# Patient Record
Sex: Female | Born: 1970 | Race: White | Hispanic: No | Marital: Married | Smoking: Never smoker
Health system: Southern US, Community
[De-identification: ages and names within clinical notes are randomized; demographics above are authoritative.]

## PROBLEM LIST (undated history)

## (undated) DIAGNOSIS — E039 Hypothyroidism, unspecified: Secondary | ICD-10-CM

## (undated) DIAGNOSIS — F259 Schizoaffective disorder, unspecified: Secondary | ICD-10-CM

## (undated) DIAGNOSIS — F419 Anxiety disorder, unspecified: Secondary | ICD-10-CM

## (undated) DIAGNOSIS — E079 Disorder of thyroid, unspecified: Secondary | ICD-10-CM

## (undated) DIAGNOSIS — R600 Localized edema: Secondary | ICD-10-CM

## (undated) DIAGNOSIS — Z9889 Other specified postprocedural states: Secondary | ICD-10-CM

## (undated) DIAGNOSIS — J45909 Unspecified asthma, uncomplicated: Secondary | ICD-10-CM

## (undated) DIAGNOSIS — F319 Bipolar disorder, unspecified: Secondary | ICD-10-CM

## (undated) DIAGNOSIS — M797 Fibromyalgia: Secondary | ICD-10-CM

## (undated) DIAGNOSIS — I10 Essential (primary) hypertension: Secondary | ICD-10-CM

## (undated) DIAGNOSIS — I5030 Unspecified diastolic (congestive) heart failure: Secondary | ICD-10-CM

## (undated) DIAGNOSIS — F250 Schizoaffective disorder, bipolar type: Secondary | ICD-10-CM

## (undated) DIAGNOSIS — R002 Palpitations: Secondary | ICD-10-CM

## (undated) DIAGNOSIS — E119 Type 2 diabetes mellitus without complications: Secondary | ICD-10-CM

## (undated) DIAGNOSIS — N83209 Unspecified ovarian cyst, unspecified side: Secondary | ICD-10-CM

## (undated) HISTORY — PX: ABDOMINAL HYSTERECTOMY: SHX81

## (undated) HISTORY — DX: Hypothyroidism, unspecified: E03.9

## (undated) HISTORY — DX: Morbid (severe) obesity due to excess calories: E66.01

## (undated) HISTORY — PX: ANKLE SURGERY: SHX546

## (undated) HISTORY — DX: Other specified postprocedural states: Z98.890

## (undated) HISTORY — DX: Unspecified diastolic (congestive) heart failure: I50.30

## (undated) HISTORY — DX: Palpitations: R00.2

---

## 2010-04-01 DIAGNOSIS — I82409 Acute embolism and thrombosis of unspecified deep veins of unspecified lower extremity: Secondary | ICD-10-CM | POA: Insufficient documentation

## 2012-02-20 DIAGNOSIS — F250 Schizoaffective disorder, bipolar type: Secondary | ICD-10-CM | POA: Insufficient documentation

## 2012-05-21 DIAGNOSIS — Z981 Arthrodesis status: Secondary | ICD-10-CM | POA: Insufficient documentation

## 2012-06-18 DIAGNOSIS — G894 Chronic pain syndrome: Secondary | ICD-10-CM | POA: Insufficient documentation

## 2012-10-16 DIAGNOSIS — F609 Personality disorder, unspecified: Secondary | ICD-10-CM | POA: Insufficient documentation

## 2013-04-11 DIAGNOSIS — K219 Gastro-esophageal reflux disease without esophagitis: Secondary | ICD-10-CM | POA: Insufficient documentation

## 2013-04-12 DIAGNOSIS — F1311 Sedative, hypnotic or anxiolytic abuse, in remission: Secondary | ICD-10-CM | POA: Insufficient documentation

## 2013-05-18 DIAGNOSIS — R600 Localized edema: Secondary | ICD-10-CM | POA: Insufficient documentation

## 2013-06-16 DIAGNOSIS — R519 Headache, unspecified: Secondary | ICD-10-CM | POA: Insufficient documentation

## 2013-06-16 DIAGNOSIS — R51 Headache: Secondary | ICD-10-CM

## 2014-05-20 ENCOUNTER — Ambulatory Visit
Admit: 2014-05-20 | Disposition: A | Discharge status: Home / Self Care | Payer: Self-pay | Attending: Family Medicine | Admitting: Family Medicine

## 2014-05-21 ENCOUNTER — Emergency Department
Admit: 2014-05-21 | Disposition: A | Discharge status: Home / Self Care | Payer: Self-pay | Admitting: Emergency Medicine

## 2014-05-21 ENCOUNTER — Ambulatory Visit
Admit: 2014-05-21 | Disposition: A | Discharge status: Home / Self Care | Payer: Self-pay | Attending: Family Medicine | Admitting: Family Medicine

## 2014-08-12 ENCOUNTER — Emergency Department
Admission: EM | Admit: 2014-08-12 | Discharge: 2014-08-12 | Disposition: A | Discharge status: Home / Self Care | Payer: Medicaid Other | Attending: Emergency Medicine | Admitting: Emergency Medicine

## 2014-08-12 ENCOUNTER — Encounter: Payer: Self-pay | Admitting: Emergency Medicine

## 2014-08-12 DIAGNOSIS — Z880 Allergy status to penicillin: Secondary | ICD-10-CM | POA: Diagnosis not present

## 2014-08-12 DIAGNOSIS — F419 Anxiety disorder, unspecified: Secondary | ICD-10-CM | POA: Diagnosis present

## 2014-08-12 DIAGNOSIS — Z765 Malingerer [conscious simulation]: Secondary | ICD-10-CM | POA: Diagnosis not present

## 2014-08-12 HISTORY — DX: Disorder of thyroid, unspecified: E07.9

## 2014-08-12 HISTORY — DX: Localized edema: R60.0

## 2014-08-12 NOTE — ED Notes (Signed)
Pt. Requested a call to a taxi cab service.  Told pt. I would call cab for her.

## 2014-08-12 NOTE — ED Notes (Signed)
Pt. Lied about her medications refills. Pt. States she has not filled xanax in months when pt. Confronted with facts pt. States she filled prescriptions just 10 days ago.  Unsure what medications pt. Takes on a daily basis.
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
## 2014-08-12 NOTE — ED Notes (Signed)
Pt. States feeling anxious all week worse today.  Pt. States she has not been taking medications regularly.

## 2014-08-12 NOTE — ED Provider Notes (Signed)
[facility] Emergency Department Provider Note  ____________________________________________  Time seen: Approximately 8:42 PM  I have reviewed the triage vital signs and the nursing notes.   HISTORY  Chief Complaint Anxiety     HPI Tonny Bollmanara Lise Furniss is a 44 y.o. female with a history of anxiety and unspecified psychiatric illness that required an act team in the past who presents with worsening anxiety for which she states has been months.  She states that due to multiple changes in location and providers she has not been able to fill her alprazolam for 8 months.  She states that she is been on edge more than usual and that she feels as if something bad is going to happen but without any specific concerns.  She specifically and repeatedly denies SI and HI.The onset is been gradual but it is now severe.  Also has significant bilateral pitting edema, but she states that this is chronic and "no big deal ".  She takes Lasix and does admit that she needs to follow up with her regular doctor regarding the edema.   Past Medical History  Diagnosis Date  . Edema leg for twenty years per patient    taking lasix for edema  . Thyroid disease     There are no active problems to display for this patient.   Past Surgical History  Procedure Laterality Date  . Ankle surgery      pt. states long time ago    Current Outpatient Rx  Name  Route  Sig  Dispense  Refill  . furosemide (LASIX) 20 MG tablet   Oral   Take 20 mg by mouth daily.           Allergies Penicillins  Family History  Problem Relation Age of Onset  . Cancer Mother     Social History History  Substance Use Topics  . Smoking status: Never Smoker   . Smokeless tobacco: Not on file  . Alcohol Use: No    Review of Systems Constitutional: No fever/chills Eyes: No visual changes. ENT: No sore throat. Cardiovascular: Denies chest pain. Respiratory: Denies shortness of  breath. Gastrointestinal: No abdominal pain.  No nausea, no vomiting.  No diarrhea.  No constipation. Genitourinary: Negative for dysuria. Musculoskeletal: Negative for back pain. Skin: Negative for rash. Neurological: Negative for headaches, focal weakness or numbness. Psych:  Worsening anxiety for "months".  Denies SI/HI.  10-point ROS otherwise negative.  ____________________________________________   PHYSICAL EXAM:  VITAL SIGNS: ED Triage Vitals  Enc Vitals Group     BP 08/12/14 1855 110/90 mmHg     Pulse Rate 08/12/14 1855 85     Resp 08/12/14 1855 20     Temp 08/12/14 1855 98.3 F (36.8 C)     Temp Source 08/12/14 1855 Oral     SpO2 08/12/14 1855 99 %     Weight --      Height --      Head Cir --      Peak Flow --      Pain Score --      Pain Loc --      Pain Edu? --      Excl. in GC? --     Constitutional: Alert and oriented. Well appearing and in no acute distress. Eyes: Conjunctivae are normal. PERRL. EOMI. Head: Atraumatic. Nose: No congestion/rhinnorhea. Mouth/Throat: Mucous membranes are moist.  Oropharynx non-erythematous. Neck: No stridor.   Cardiovascular: Normal rate, regular rhythm. Grossly normal heart sounds.  Good peripheral circulation. Respiratory: Normal respiratory effort.  No retractions. Lungs CTAB. Gastrointestinal: Soft and nontender. No distention. No abdominal bruits. No CVA tenderness. Musculoskeletal: Chronic bilateral lower extremity pitting edema without evidence of cellulitis.  No joint effusions. Neurologic:  Normal speech and language. No gross focal neurologic deficits are appreciated.  Skin:  Skin is warm, dry and intact. No rash noted. Psychiatric: Mood and affect are normal. Speech and behavior are normal.  Denies hallucinations.  Denies SI/HI.  ____________________________________________   LABS (all labs ordered are listed, but only abnormal results are displayed)  Labs Reviewed - No data to  display ____________________________________________  EKG  Not indicated ____________________________________________  RADIOLOGY  No results found.  ____________________________________________   PROCEDURES  Procedure(s) performed: None  Critical Care performed: No ____________________________________________   INITIAL IMPRESSION / ASSESSMENT AND PLAN / ED COURSE  Pertinent labs & imaging results that were available during my care of the patient were reviewed by me and considered in my medical decision making (see chart for details).  The patient states that she has anxiety that is been getting worse and that she has not taken her benzos for months.  However, when I checked the [place] controlled substance database, it is evident that she has been getting not only her regular prescribed monthly Xanax, but additional prescriptions as well.  I discussed this with her and she was speech was and admitted that may be she has gotten some as recently as 10 days ago.  I explained that we would not prescribe additional medication and that misrepresenting herself and her prescription status to the emergency department is not taken lightly.  I gave her outpatient follow-up information with [facility] in my usual and customary return precautions.  I left the patient's controlled substance database report on the chart to be scanned in.  ____________________________________________  FINAL CLINICAL IMPRESSION(S) / ED DIAGNOSES  Final diagnoses:  Anxiety  Drug-seeking behavior      NEW MEDICATIONS STARTED DURING THIS VISIT:  Discharge Medication List as of 08/12/2014  9:32 PM       Loleta Rose, MD 08/12/14 2329

## 2014-08-12 NOTE — Discharge Instructions (Signed)
As we discussed, you need to follow up with your regular doctor and therapist regarding her chronic anxiety.  We cannot prescribe additional medication over the medications you are already receiving.  Following up at [facility] if your regular therapist is not available.   Panic Attacks Panic attacks are sudden, short-livedsurges of severe anxiety, fear, or discomfort. They may occur for no reason when you are relaxed, when you are anxious, or when you are sleeping. Panic attacks may occur for a number of reasons:   Healthy people occasionally have panic attacks in extreme, life-threatening situations, such as war or natural disasters. Normal anxiety is a protective mechanism of the body that helps [place] react to danger (fight or flight response).  Panic attacks are often seen with anxiety disorders, such as panic disorder, social anxiety disorder, generalized anxiety disorder, and phobias. Anxiety disorders cause excessive or uncontrollable anxiety. They may interfere with your relationships or other life activities.  Panic attacks are sometimes seen with other mental illnesses, such as depression and posttraumatic stress disorder.  Certain medical conditions, prescription medicines, and drugs of abuse can cause panic attacks. SYMPTOMS  Panic attacks start suddenly, peak within 20 minutes, and are accompanied by four or more of the following symptoms:  Pounding heart or fast heart rate (palpitations).  Sweating.  Trembling or shaking.  Shortness of breath or feeling smothered.  Feeling choked.  Chest pain or discomfort.  Nausea or strange feeling in your stomach.  Dizziness, light-headedness, or feeling like you will faint.  Chills or hot flushes.  Numbness or tingling in your lips or hands and feet.  Feeling that things are not real or feeling that you are not yourself.  Fear of losing control or going crazy.  Fear of dying. Some of these symptoms can mimic serious medical  conditions. For example, you may think you are having a heart attack. Although panic attacks can be very scary, they are not life threatening. DIAGNOSIS  Panic attacks are diagnosed through an assessment by your health care provider. Your health care provider will ask questions about your symptoms, such as where and when they occurred. Your health care provider will also ask about your medical history and use of alcohol and drugs, including prescription medicines. Your health care provider may order blood tests or other studies to rule out a serious medical condition. Your health care provider may refer you to a mental health professional for further evaluation. TREATMENT   Most healthy people who have one or two panic attacks in an extreme, life-threatening situation will not require treatment.  The treatment for panic attacks associated with anxiety disorders or other mental illness typically involves counseling with a mental health professional, medicine, or a combination of both. Your health care provider will help determine what treatment is best for you.  Panic attacks due to physical illness usually go away with treatment of the illness. If prescription medicine is causing panic attacks, talk with your health care provider about stopping the medicine, decreasing the dose, or substituting another medicine.  Panic attacks due to alcohol or drug abuse go away with abstinence. Some adults need professional help in order to stop drinking or using drugs. HOME CARE INSTRUCTIONS   Take all medicines as directed by your health care provider.   Schedule and attend follow-up visits as directed by your health care provider. It is important to keep all your appointments. SEEK MEDICAL CARE IF:  You are not able to take your medicines as prescribed.  Your  symptoms do not improve or get worse. SEEK IMMEDIATE MEDICAL CARE IF:   You experience panic attack symptoms that are different than your usual  symptoms.  You have serious thoughts about hurting yourself or others.  You are taking medicine for panic attacks and have a serious side effect. MAKE SURE YOU:  Understand these instructions.  Will watch your condition.  Will get help right away if you are not doing well or get worse. Document Released: 01/13/2005 Document Revised: 01/18/2013 Document Reviewed: 08/27/2012 [facility] Patient Information 2015 [place]. This information is not intended to replace advice given to you by your health care provider. Make sure you discuss any questions you have with your health care provider.

## 2014-08-12 NOTE — ED Notes (Signed)
Pt has history of anxiety, hypothyroidism, and bilateral lower extremity edema.  Pt lives with husband and daughter.  Pt called EMS because she felt as if something "scared" her.  Pt is allergic to codeine and penicillins.  VS were stable PTA and EKG was normal sinus.  Pt states that she has been without her anti-anxiety medication for 8 months.  Pt c/o 6/10 pain in bilateral lower extremities.  Pt states that this pain is chronic and is "no big deal."

## 2015-10-09 DIAGNOSIS — E782 Mixed hyperlipidemia: Secondary | ICD-10-CM | POA: Insufficient documentation

## 2015-10-09 DIAGNOSIS — E876 Hypokalemia: Secondary | ICD-10-CM | POA: Insufficient documentation

## 2015-12-08 ENCOUNTER — Emergency Department: Payer: Medicaid Other

## 2015-12-08 ENCOUNTER — Emergency Department
Admission: EM | Admit: 2015-12-08 | Discharge: 2015-12-08 | Disposition: A | Discharge status: Home / Self Care | Payer: Medicaid Other | Attending: Emergency Medicine | Admitting: Emergency Medicine

## 2015-12-08 DIAGNOSIS — R110 Nausea: Secondary | ICD-10-CM | POA: Diagnosis not present

## 2015-12-08 DIAGNOSIS — R1031 Right lower quadrant pain: Secondary | ICD-10-CM | POA: Diagnosis not present

## 2015-12-08 DIAGNOSIS — Z79899 Other long term (current) drug therapy: Secondary | ICD-10-CM | POA: Insufficient documentation

## 2015-12-08 DIAGNOSIS — Z90710 Acquired absence of both cervix and uterus: Secondary | ICD-10-CM | POA: Insufficient documentation

## 2015-12-08 HISTORY — DX: Fibromyalgia: M79.7

## 2015-12-08 LAB — CBC WITH DIFFERENTIAL/PLATELET
Basophils Absolute: 0.1 10*3/uL (ref 0–0.1)
Basophils Relative: 1 %
Eosinophils Absolute: 0.6 10*3/uL (ref 0–0.7)
Eosinophils Relative: 6 %
HEMATOCRIT: 41.6 % (ref 35.0–47.0)
HEMOGLOBIN: 13.8 g/dL (ref 12.0–16.0)
LYMPHS PCT: 43 %
Lymphs Abs: 4.2 10*3/uL — ABNORMAL HIGH (ref 1.0–3.6)
MCH: 28.5 pg (ref 26.0–34.0)
MCHC: 33.2 g/dL (ref 32.0–36.0)
MCV: 86 fL (ref 80.0–100.0)
MONO ABS: 0.6 10*3/uL (ref 0.2–0.9)
MONOS PCT: 6 %
NEUTROS ABS: 4.4 10*3/uL (ref 1.4–6.5)
NEUTROS PCT: 44 %
Platelets: 336 10*3/uL (ref 150–440)
RBC: 4.84 MIL/uL (ref 3.80–5.20)
RDW: 15.8 % — ABNORMAL HIGH (ref 11.5–14.5)
WBC: 9.9 10*3/uL (ref 3.6–11.0)

## 2015-12-08 LAB — URINALYSIS COMPLETE WITH MICROSCOPIC (ARMC ONLY)
Bilirubin Urine: NEGATIVE
Glucose, UA: NEGATIVE mg/dL
Hgb urine dipstick: NEGATIVE
KETONES UR: NEGATIVE mg/dL
LEUKOCYTES UA: NEGATIVE
Nitrite: NEGATIVE
PROTEIN: NEGATIVE mg/dL
Specific Gravity, Urine: 1.018 (ref 1.005–1.030)
pH: 5 (ref 5.0–8.0)

## 2015-12-08 LAB — COMPREHENSIVE METABOLIC PANEL
ALT: 14 U/L (ref 14–54)
ANION GAP: 6 (ref 5–15)
AST: 25 U/L (ref 15–41)
Albumin: 4.4 g/dL (ref 3.5–5.0)
Alkaline Phosphatase: 62 U/L (ref 38–126)
BILIRUBIN TOTAL: 0.9 mg/dL (ref 0.3–1.2)
BUN: 7 mg/dL (ref 6–20)
CHLORIDE: 111 mmol/L (ref 101–111)
CO2: 24 mmol/L (ref 22–32)
Calcium: 9.5 mg/dL (ref 8.9–10.3)
Creatinine, Ser: 0.84 mg/dL (ref 0.44–1.00)
GFR calc Af Amer: >60 mL/min (ref >60)
GFR calc non Af Amer: >60 mL/min (ref >60)
GLUCOSE: 69 mg/dL (ref 65–99)
Potassium: 3.9 mmol/L (ref 3.5–5.1)
SODIUM: 141 mmol/L (ref 135–145)
TOTAL PROTEIN: 7.7 g/dL (ref 6.5–8.1)

## 2015-12-08 LAB — LIPASE, BLOOD: Lipase: 20 U/L (ref 11–51)

## 2015-12-08 MED ORDER — MORPHINE SULFATE (PF) 4 MG/ML IV SOLN
4.0000 mg | Freq: Once | INTRAVENOUS | Status: AC
  Administered 2015-12-08: 4 mg via INTRAVENOUS
  Filled 2015-12-08: qty 1

## 2015-12-08 MED ORDER — HYDROCODONE-ACETAMINOPHEN 5-325 MG PO TABS: 1.0000 | ORAL_TABLET | ORAL | 0 refills | Status: DC | PRN

## 2015-12-08 MED ORDER — IOPAMIDOL (ISOVUE-300) INJECTION 61%
30.0000 mL | Freq: Once | INTRAVENOUS | Status: AC | PRN
  Administered 2015-12-08: 30 mL via ORAL

## 2015-12-08 MED ORDER — KETOROLAC TROMETHAMINE 30 MG/ML IJ SOLN
15.0000 mg | Freq: Once | INTRAMUSCULAR | Status: AC
  Administered 2015-12-08: 15 mg via INTRAVENOUS
  Filled 2015-12-08: qty 1

## 2015-12-08 MED ORDER — IOPAMIDOL (ISOVUE-300) INJECTION 61%
100.0000 mL | Freq: Once | INTRAVENOUS | Status: AC | PRN
  Administered 2015-12-08: 100 mL via INTRAVENOUS

## 2015-12-08 NOTE — ED Notes (Signed)
MD Forbach at bedside. 

## 2015-12-08 NOTE — ED Notes (Signed)
Patient transported to Ultrasound 

## 2015-12-08 NOTE — ED Notes (Signed)
Pt took purse/wallet with her to CT

## 2015-12-08 NOTE — Discharge Instructions (Signed)
You have been seen in the Emergency Department (ED) for abdominal pain.  Your evaluation did not identify a clear cause of your symptoms but was generally reassuring.  Take Norco as prescribed for severe pain. Do not drink alcohol, drive or participate in any other potentially dangerous activities while taking this medication as it may make you sleepy. Do not take this medication with any other sedating medications, either prescription or over-the-counter. If you were prescribed Percocet or Vicodin, do not take these with acetaminophen (Tylenol) as it is already contained within these medications.   This medication is an opiate (or narcotic) pain medication and can be habit forming.  Use it as little as possible to achieve adequate pain control.  Do not use or use it with extreme caution if you have a history of opiate abuse or dependence.  If you are on a pain contract with your primary care doctor or a pain specialist, be sure to let them know you were prescribed this medication today from [facility].  This medication is intended for your use only - do not give any to anyone else and keep it in a secure place where nobody else, especially children, have access to it.  It will also cause or worsen constipation, so you may want to consider taking an over-the-counter stool softener while you are taking this medication.  Please follow up as instructed above regarding today?s emergent visit and the symptoms that are bothering you.  Return to the ED if your abdominal pain worsens or fails to improve, you develop bloody vomiting, bloody diarrhea, you are unable to tolerate fluids due to vomiting, fever greater than 101, or other symptoms that concern you.

## 2015-12-08 NOTE — ED Notes (Signed)
Error in charting - no trauma
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
## 2015-12-08 NOTE — ED Provider Notes (Signed)
[facility] Emergency Department Provider Note  ____________________________________________   None    (approximate)  I have reviewed the triage vital signs and the nursing notes.   HISTORY  Chief Complaint Abdominal Pain    HPI Rhonda Palmer is a 45 y.o. female with a history of right lower quadrant abdominal pain intermittently for about a year and a history reportedly of a large ovary on her right cyst status post hysterectomy who presents for evaluation of about a week of intermittent pain in the right lower quadrant.  She reports that it has gotten worse over the last 24 hours.  She has sharp and stabbing pain that happens intermittently and lasts minutes to hours at a time.  It is currently 8 out of 10 (severe).  It is accompanied with nausea but she denies vomiting.  She reports that nothing makes it better and movement makes it worse.  She denies vaginal bleeding, fever/chills, chest pain, shortness of breath.  She denies dysuria.  She reports it is much worse than prior pain.  She has been on tramadol for months prescribed by her primary care doctor and she has not wanted to go to a pain clinic as has been recommended in the past by her PCP (this information was obtained by me through the electronic medical records, not from the patient).   Past Medical History:  Diagnosis Date  . Edema leg for twenty years per patient   taking lasix for edema  . Fibromyalgia   . Thyroid disease     There are no active problems to display for this patient.   Past Surgical History:  Procedure Laterality Date  . ANKLE SURGERY     pt. states long time ago    Prior to Admission medications   Medication Sig Start Date End Date Taking? Authorizing Provider  ARIPiprazole (ABILIFY) 10 MG tablet Take 10 mg by mouth daily.   Yes Historical Provider, MD  haloperidol (HALDOL) 10 MG tablet Take 10 mg by mouth 4 (four) times daily.   Yes Historical Provider, MD    ibuprofen (ADVIL,MOTRIN) 600 MG tablet Take 600 mg by mouth every 6 (six) hours as needed.   Yes Historical Provider, MD  levothyroxine (SYNTHROID, LEVOTHROID) 100 MCG tablet Take 100 mcg by mouth daily before breakfast.   Yes Historical Provider, MD  pantoprazole (PROTONIX) 20 MG tablet Take 40 mg by mouth daily.   Yes Historical Provider, MD  topiramate (TOPAMAX) 100 MG tablet Take 100 mg by mouth 2 (two) times daily.   Yes Historical Provider, MD  traMADol (ULTRAM) 50 MG tablet Take by mouth every 6 (six) hours as needed.   Yes Historical Provider, MD  venlafaxine (EFFEXOR) 100 MG tablet Take 100 mg by mouth 2 (two) times daily.   Yes Historical Provider, MD  furosemide (LASIX) 20 MG tablet Take 20 mg by mouth daily.    Historical Provider, MD  HYDROcodone-acetaminophen (NORCO/VICODIN) 5-325 MG tablet Take 1-2 tablets by mouth every 4 (four) hours as needed for moderate pain. 12/08/15   Loleta Roseory Tyson Parkison, MD    Allergies Sulfa antibiotics; Codeine; and Penicillins  Family History  Problem Relation Age of Onset  . Heart failure Mother     Social History Social History  Substance Use Topics  . Smoking status: Never Smoker  . Smokeless tobacco: Never Used  . Alcohol use No    Review of Systems Constitutional: No fever/chills Eyes: No visual changes. ENT: No sore throat. Cardiovascular: Denies chest  pain. Respiratory: Denies shortness of breath. Gastrointestinal: +RLQ abd pain, nausea, no vomiting, recent diarrhea due to taking laxatives Genitourinary: Negative for dysuria. Musculoskeletal: Negative for back pain. Skin: Negative for rash. Neurological: Negative for headaches, focal weakness or numbness.  10-point ROS otherwise negative.  ____________________________________________   PHYSICAL EXAM:  VITAL SIGNS: ED Triage Vitals  Enc Vitals Group     BP 12/08/15 0023 124/84     Pulse Rate 12/08/15 0023 79     Resp 12/08/15 0023 14     Temp 12/08/15 0023 97.9 F (36.6  C)     Temp Source 12/08/15 0023 Oral     SpO2 12/08/15 0020 97 %     Weight 12/08/15 0029 180 lb (81.6 kg)     Height 12/08/15 0029 5' (1.524 m)     Head Circumference --      Peak Flow --      Pain Score 12/08/15 0029 8     Pain Loc --      Pain Edu? --      Excl. in GC? --     Constitutional: Alert and oriented. Well appearing and in no acute distress. Eyes: Conjunctivae are normal. PERRL. EOMI. Head: Atraumatic. Nose: No congestion/rhinnorhea. Mouth/Throat: Mucous membranes are moist.  Oropharynx non-erythematous. Neck: No stridor.  No meningeal signs.   Cardiovascular: Normal rate, regular rhythm. Good peripheral circulation. Grossly normal heart sounds. Respiratory: Normal respiratory effort.  No retractions. Lungs CTAB. Gastrointestinal: Soft and nontender. No distention.  Genitourinary: deferred Musculoskeletal: No lower extremity tenderness nor edema. No gross deformities of extremities. Neurologic:  Normal speech and language. No gross focal neurologic deficits are appreciated.  Skin:  Skin is warm, dry and intact. No rash noted. Psychiatric: Mood and affect are depressed but essentially normal. Speech and behavior are normal.  ____________________________________________   LABS (all labs ordered are listed, but only abnormal results are displayed)  Labs Reviewed  CBC WITH DIFFERENTIAL/PLATELET - Abnormal; Notable for the following:       Result Value   RDW 15.8 (*)    Lymphs Abs 4.2 (*)    All other components within normal limits  URINALYSIS COMPLETEWITH MICROSCOPIC ([facility] ONLY) - Abnormal; Notable for the following:    Color, Urine YELLOW (*)    APPearance HAZY (*)    Bacteria, UA MANY (*)    Squamous Epithelial / LPF 0-5 (*)    All other components within normal limits  URINE CULTURE  COMPREHENSIVE METABOLIC PANEL  LIPASE, BLOOD   ____________________________________________  EKG  ED ECG REPORT I, Kadee Philyaw, the attending physician, personally  viewed and interpreted this ECG.  Date: 12/08/2015 EKG Time: 00:36 Rate: 77 Rhythm: normal sinus rhythm QRS Axis: normal Intervals: normal ST/T Wave abnormalities: Non-specific ST segment / T-wave changes, but no evidence of acute ischemia. Conduction Disturbances: none Narrative Interpretation: unremarkable  ____________________________________________  RADIOLOGY   US Transvaginal Non-ob  Result Date: 12/08/2015 CLINICAL DATA:  Intermittent right lower quadrant pain since yesterday. Previous hysterectomy. EXAM: TRANSABDOMINAL AND TRANSVAGINAL ULTRASOUND OF PELVIS TECHNIQUE: Both transabdominal and transvaginal ultrasound examinations of the pelvis were performed. Transabdominal technique was performed for global imaging of the pelvis including uterus, ovaries, adnexal regions, and pelvic cul-de-sac. It was necessary to proceed with endovaginal exam following the transabdominal exam to visualize the ovaries. COMPARISON:  None FINDINGS: Uterus Uterus is surgically absent. Endometrium Surgically absent. Right ovary Not visualized. Left ovary Not visualized. Other findings Visualization of the pelvis is limited due to large amount of bowel  gas and stool in the pelvis. Ovaries are not visualized and therefore no ovarian Doppler examination is performed. No free fluid is seen in the pelvis. No large pelvic mass lesions are identified. IMPRESSION: Uterus is surgically absent. Right and left ovaries are not visualized due to overlying bowel. Electronically Signed   By: Burman NievesWilliam  Stevens M.D.   On: 12/08/2015 03:19   [place] Pelvis Complete  Result Date: 12/08/2015 CLINICAL DATA:  Intermittent right lower quadrant pain since yesterday. Previous hysterectomy. EXAM: TRANSABDOMINAL AND TRANSVAGINAL ULTRASOUND OF PELVIS TECHNIQUE: Both transabdominal and transvaginal ultrasound examinations of the pelvis were performed. Transabdominal technique was performed for global imaging of the pelvis including uterus,  ovaries, adnexal regions, and pelvic cul-de-sac. It was necessary to proceed with endovaginal exam following the transabdominal exam to visualize the ovaries. COMPARISON:  None FINDINGS: Uterus Uterus is surgically absent. Endometrium Surgically absent. Right ovary Not visualized. Left ovary Not visualized. Other findings Visualization of the pelvis is limited due to large amount of bowel gas and stool in the pelvis. Ovaries are not visualized and therefore no ovarian Doppler examination is performed. No free fluid is seen in the pelvis. No large pelvic mass lesions are identified. IMPRESSION: Uterus is surgically absent. Right and left ovaries are not visualized due to overlying bowel. Electronically Signed   By: Burman NievesWilliam  Stevens M.D.   On: 12/08/2015 03:19   Ct Abdomen Pelvis W Contrast  Result Date: 12/08/2015 CLINICAL DATA:  45 y/o F; right-sided abdominal pain with history of right-sided ovarian cysts. Concern for cyst rupture. EXAM: CT ABDOMEN AND PELVIS WITH CONTRAST TECHNIQUE: Multidetector CT imaging of the abdomen and pelvis was performed using the standard protocol following bolus administration of intravenous contrast. CONTRAST:  100mL ISOVUE-300 IOPAMIDOL (ISOVUE-300) INJECTION 61% COMPARISON:  None. FINDINGS: Lower chest: No acute abnormality. Hepatobiliary: No focal liver abnormality is seen. Status post cholecystectomy. No intrahepatic biliary ductal dilatation. Mild dilatation of the common bile duct is likely within normal limits given cholecystectomy. Pancreas: Unremarkable. No pancreatic ductal dilatation or surrounding inflammatory changes. Spleen: Normal in size without focal abnormality. Adrenals/Urinary Tract: Adrenal glands are unremarkable. Right greater than left extrarenal pelvis. Kidneys are normal, without renal calculi, focal lesion, or hydronephrosis. Bladder is unremarkable. Stomach/Bowel: Stomach is within normal limits. Appendix is borderline in size however there are no  secondary signs of appendicitis, this is likely a normal variant (series 7 image 46). No evidence of bowel wall thickening, distention, or inflammatory changes. Vascular/Lymphatic: No significant vascular findings are present. No enlarged abdominal or pelvic lymph nodes. Reproductive: Status post hysterectomy. No adnexal masses. No evidence for ruptured ovarian cyst. Other: Right flank subcutaneous fat calcifications likely represents sequelae of old trauma. Musculoskeletal: No acute or significant osseous findings. IMPRESSION: 1. No acute process identified as explanation for pain. No evidence for ruptured ovarian cyst. 2. Unremarkable CT of abdomen and pelvis for age. Electronically Signed   By: Mitzi HansenLance  Furusawa-Stratton M.D.   On: 12/08/2015 05:36    ____________________________________________   PROCEDURES  Procedure(s) performed:   Procedures   Critical Care performed: No ____________________________________________   INITIAL IMPRESSION / ASSESSMENT AND PLAN / ED COURSE  Pertinent labs & imaging results that were available during my care of the patient were reviewed by me and considered in my medical decision making (see chart for details).  Given the patient's history of adnexal issues we will obtain an ultrasound including Dopplers to rule out torsion given the intermittent nature of the pain.  Labs are coming back but are  generally reassuring at this point and she has normal vital signs and no fever with days of symptoms, unlikely to be appendicitis.  I am treating the acute pain and I reviewed the [place] controlled substance database and see that she has only had prescriptions for tramadol recently and over the last 6 months.   Clinical Course as of Dec 07 920  Sat Dec 08, 2015  1610 The patient's workup has been reassuring.  The adnexa were not specifically identified on the ultrasound due to the overlying bowel, but the patient reports that she takes laxatives if she has  been having diarrhea and absolutely could not be constipated.  We discussed the risks and benefits of obtaining a CT scan of her abdomen and pelvis given that she has normal vital signs and normal labs but she is very concerned about the possibility of something wrong in her abdomen and once the scan.  We will proceed with oral and IV contrast and reassess.  I will give her 1 additional dose of pain medication.  [CF]  0551 Unremarkable CT scan.  Patient remains anxious but in NAD.  Provided reassurance and encouraged her to follow up as an outpatient.I gave my usual and customary return precautions.  CT ABDOMEN PELVIS W CONTRAST [CF]    Clinical Course User Index [CF] Loleta Rose, MD    ____________________________________________  FINAL CLINICAL IMPRESSION(S) / ED DIAGNOSES  Final diagnoses:  Abdominal pain, RLQ     MEDICATIONS GIVEN DURING THIS VISIT:  Medications  ketorolac (TORADOL) 30 MG/ML injection 15 mg (15 mg Intravenous Given 12/08/15 0113)  morphine 4 MG/ML injection 4 mg (4 mg Intravenous Given 12/08/15 0115)  morphine 4 MG/ML injection 4 mg (4 mg Intravenous Given 12/08/15 0454)  iopamidol (ISOVUE-300) 61 % injection 30 mL (30 mLs Oral Contrast Given 12/08/15 0400)  iopamidol (ISOVUE-300) 61 % injection 100 mL (100 mLs Intravenous Contrast Given 12/08/15 0500)     NEW OUTPATIENT MEDICATIONS STARTED DURING THIS VISIT:  Discharge Medication List as of 12/08/2015  5:54 AM    START taking these medications   Details  HYDROcodone-acetaminophen (NORCO/VICODIN) 5-325 MG tablet Take 1-2 tablets by mouth every 4 (four) hours as needed for moderate pain., Starting Sat 12/08/2015, Print        Discharge Medication List as of 12/08/2015  5:54 AM      Discharge Medication List as of 12/08/2015  5:54 AM       Note:  This document was prepared using Dragon voice recognition software and may include unintentional dictation errors.    Loleta Rose, MD 12/08/15  [id number]

## 2015-12-08 NOTE — ED Notes (Signed)
Pt c/o raised painful bump to posterior head

## 2015-12-08 NOTE — ED Notes (Signed)
Pt in room waiting for ride.

## 2015-12-08 NOTE — ED Notes (Signed)
Patient transported to CT 
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
## 2015-12-08 NOTE — ED Triage Notes (Signed)
Pt c/o lower right abdominal pain rated at 8 out of 10. Pt reports being diagnosed with ovarian cyst on right side. Pt worried about possibility of cyst rupture.

## 2015-12-08 NOTE — ED Notes (Signed)
Reviewed d/c instructions, follow-up care and prescription with pt. Pt verbalized understanding 

## 2015-12-09 LAB — URINE CULTURE
Culture: <10000 — AB
SPECIAL REQUESTS: NORMAL

## 2015-12-10 ENCOUNTER — Emergency Department
Admission: EM | Admit: 2015-12-10 | Discharge: 2015-12-10 | Disposition: A | Discharge status: Home / Self Care | Payer: Medicaid Other | Attending: Student in an Organized Health Care Education/Training Program | Admitting: Student in an Organized Health Care Education/Training Program

## 2015-12-10 ENCOUNTER — Encounter: Payer: Self-pay | Admitting: Emergency Medicine

## 2015-12-10 DIAGNOSIS — Z791 Long term (current) use of non-steroidal anti-inflammatories (NSAID): Secondary | ICD-10-CM | POA: Insufficient documentation

## 2015-12-10 DIAGNOSIS — R1031 Right lower quadrant pain: Secondary | ICD-10-CM | POA: Insufficient documentation

## 2015-12-10 DIAGNOSIS — Z79899 Other long term (current) drug therapy: Secondary | ICD-10-CM | POA: Diagnosis not present

## 2015-12-10 DIAGNOSIS — R102 Pelvic and perineal pain: Secondary | ICD-10-CM | POA: Diagnosis not present

## 2015-12-10 HISTORY — DX: Unspecified ovarian cyst, unspecified side: N83.209

## 2015-12-10 LAB — COMPREHENSIVE METABOLIC PANEL
ALBUMIN: 4 g/dL (ref 3.5–5.0)
ALK PHOS: 57 U/L (ref 38–126)
ALT: 14 U/L (ref 14–54)
ANION GAP: 5 (ref 5–15)
AST: 21 U/L (ref 15–41)
BUN: 12 mg/dL (ref 6–20)
CALCIUM: 9 mg/dL (ref 8.9–10.3)
CO2: 20 mmol/L — ABNORMAL LOW (ref 22–32)
Chloride: 114 mmol/L — ABNORMAL HIGH (ref 101–111)
Creatinine, Ser: 0.67 mg/dL (ref 0.44–1.00)
GFR calc Af Amer: >60 mL/min (ref >60)
GFR calc non Af Amer: >60 mL/min (ref >60)
Glucose, Bld: 91 mg/dL (ref 65–99)
Potassium: 4 mmol/L (ref 3.5–5.1)
Sodium: 139 mmol/L (ref 135–145)
Total Bilirubin: 0.6 mg/dL (ref 0.3–1.2)
Total Protein: 6.9 g/dL (ref 6.5–8.1)

## 2015-12-10 LAB — CBC
HEMATOCRIT: 39.2 % (ref 35.0–47.0)
Hemoglobin: 12.9 g/dL (ref 12.0–16.0)
MCH: 27.9 pg (ref 26.0–34.0)
MCHC: 32.8 g/dL (ref 32.0–36.0)
MCV: 85.2 fL (ref 80.0–100.0)
Platelets: 175 10*3/uL (ref 150–440)
RBC: 4.6 MIL/uL (ref 3.80–5.20)
RDW: 16 % — ABNORMAL HIGH (ref 11.5–14.5)
WBC: 8.6 10*3/uL (ref 3.6–11.0)

## 2015-12-10 LAB — CHLAMYDIA/NGC RT PCR (ARMC ONLY)
Chlamydia Tr: NOT DETECTED
N gonorrhoeae: NOT DETECTED

## 2015-12-10 LAB — WET PREP, GENITAL
CLUE CELLS WET PREP: NONE SEEN
SPERM: NONE SEEN
TRICH WET PREP: NONE SEEN
YEAST WET PREP: NONE SEEN

## 2015-12-10 LAB — URINALYSIS COMPLETE WITH MICROSCOPIC (ARMC ONLY)
Bilirubin Urine: NEGATIVE
Glucose, UA: NEGATIVE mg/dL
Hgb urine dipstick: NEGATIVE
Ketones, ur: NEGATIVE mg/dL
Leukocytes, UA: NEGATIVE
Nitrite: NEGATIVE
PH: 5 (ref 5.0–8.0)
PROTEIN: NEGATIVE mg/dL
Specific Gravity, Urine: 1.011 (ref 1.005–1.030)

## 2015-12-10 LAB — LIPASE, BLOOD: Lipase: 18 U/L (ref 11–51)

## 2015-12-10 MED ORDER — DICYCLOMINE HCL 10 MG PO CAPS
10.0000 mg | ORAL_CAPSULE | Freq: Once | ORAL | Status: AC
  Administered 2015-12-10: 10 mg via ORAL
  Filled 2015-12-10: qty 1

## 2015-12-10 MED ORDER — LORAZEPAM 1 MG PO TABS
1.0000 mg | ORAL_TABLET | Freq: Once | ORAL | Status: AC
  Administered 2015-12-10: 1 mg via ORAL
  Filled 2015-12-10: qty 1

## 2015-12-10 MED ORDER — HYDROCODONE-ACETAMINOPHEN 5-325 MG PO TABS
1.0000 | ORAL_TABLET | Freq: Once | ORAL | Status: AC
  Administered 2015-12-10: 1 via ORAL
  Filled 2015-12-10: qty 1

## 2015-12-10 MED ORDER — TRAMADOL HCL 50 MG PO TABS
50.0000 mg | ORAL_TABLET | Freq: Four times a day (QID) | ORAL | 0 refills | Status: DC | PRN
Start: 2015-12-10 — End: 2016-01-17

## 2015-12-10 MED ORDER — TRAMADOL HCL 50 MG PO TABS
50.0000 mg | ORAL_TABLET | Freq: Once | ORAL | Status: DC
Start: 2015-12-10 — End: 2015-12-10

## 2015-12-10 NOTE — ED Triage Notes (Signed)
Says right lower quadrant pain has not gotten better since her visit on Friday.  Says she has ovarian cyst.  Her doctor is not in today.

## 2015-12-10 NOTE — ED Notes (Signed)
Pt c/o LRQ pain since Thursday.  Pt sts [facility] was [facility] and had imaging/blood done.  Pt sts that scans were clear, worries about cyst rupture.  Pt sts last CT/US done 6 months ago "and it showed a cyst the size of a softball".  Pt sts pain similar to cyst pain 6 months ago.  Tender to palpation.  No n/v/d at this time.  Skin color WNL, resp even and unlabored.

## 2015-12-10 NOTE — ED Notes (Signed)
Pt requests prescription for pain medication, informed pt that this RN would relay message to ED provider

## 2015-12-10 NOTE — ED Provider Notes (Signed)
[facility] [place] Emergency Department Provider Note    First MD Initiated Contact with Patient 12/10/15 1651     (approximate)  I have reviewed the triage vital signs and the nursing notes.   HISTORY  Chief Complaint Abdominal Pain    HPI Rhonda Palmer is a 45 y.o. female with recurrent right lower quadrant and pelvic pain since the last week. Patient was recently seen on Friday for similar complaints had negative CT negative ultrasound was discharged with pain medication for which he hemodynamically in. Patient states that the pain is not improved she tried calling her GYN office but they were not open today she became scared so she came to the ER. States that she is worried that there is something going on in her abdominal wall where her ovary. Patient states she was told previously that her ovary was the size of a grapefruit. She was previously offered surgery but declined it at that time. She denies any nausea or vomiting. No diarrhea or constipation. Denies any fevers.   Past Medical History:  Diagnosis Date  . Edema leg for twenty years per patient   taking lasix for edema  . Fibromyalgia   . Ovarian cyst   . Thyroid disease    Family History  Problem Relation Age of Onset  . Heart failure Mother    Past Surgical History:  Procedure Laterality Date  . ANKLE SURGERY     pt. states long time ago   There are no active problems to display for this patient.     Prior to Admission medications   Medication Sig Start Date End Date Taking? Authorizing Provider  ARIPiprazole (ABILIFY) 10 MG tablet Take 10 mg by mouth daily.    Historical Provider, MD  furosemide (LASIX) 20 MG tablet Take 20 mg by mouth daily.    Historical Provider, MD  haloperidol (HALDOL) 10 MG tablet Take 10 mg by mouth 4 (four) times daily.    Historical Provider, MD  HYDROcodone-acetaminophen (NORCO/VICODIN) 5-325 MG tablet Take 1-2 tablets by mouth every 4 (four) hours as  needed for moderate pain. 12/08/15   Loleta Rose, MD  ibuprofen (ADVIL,MOTRIN) 600 MG tablet Take 600 mg by mouth every 6 (six) hours as needed.    Historical Provider, MD  levothyroxine (SYNTHROID, LEVOTHROID) 100 MCG tablet Take 100 mcg by mouth daily before breakfast.    Historical Provider, MD  pantoprazole (PROTONIX) 20 MG tablet Take 40 mg by mouth daily.    Historical Provider, MD  topiramate (TOPAMAX) 100 MG tablet Take 100 mg by mouth 2 (two) times daily.    Historical Provider, MD  traMADol (ULTRAM) 50 MG tablet Take by mouth every 6 (six) hours as needed.    Historical Provider, MD  venlafaxine (EFFEXOR) 100 MG tablet Take 100 mg by mouth 2 (two) times daily.    Historical Provider, MD    Allergies Sulfa antibiotics; Codeine; and Penicillins    Social History Social History  Substance Use Topics  . Smoking status: Never Smoker  . Smokeless tobacco: Never Used  . Alcohol use No    Review of Systems Patient denies headaches, rhinorrhea, blurry vision, numbness, shortness of breath, chest pain, edema, cough, abdominal pain, nausea, vomiting, diarrhea, dysuria, fevers, rashes or hallucinations unless otherwise stated above in HPI. ____________________________________________   PHYSICAL EXAM:  VITAL SIGNS: Vitals:   12/10/15 1421  BP: 114/88  Pulse: 77  Resp: 16  Temp: 98.2 F (36.8 C)    Constitutional: Alert  and oriented. in no acute distress. Eyes: Conjunctivae are normal. Pupils are 4mm and reactive. EOMI. Head: Atraumatic. Nose: No congestion/rhinnorhea. Mouth/Throat: Mucous membranes are moist.  Oropharynx non-erythematous. Neck: No stridor. Painless ROM. No cervical spine tenderness to palpation Hematological/Lymphatic/Immunilogical: No cervical lymphadenopathy. Cardiovascular: Normal rate, regular rhythm. Grossly normal heart sounds.  Good peripheral circulation. Respiratory: Normal respiratory effort.  No retractions. Lungs CTAB. Gastrointestinal: Soft  and nontender. No distention. No abdominal bruits. No CVA tenderness. Genitourinary: normal appearing vaginal mucosa. No adnexal ttp. Musculoskeletal: No lower extremity tenderness nor edema.  No joint effusions. Neurologic:  Normal speech and language. No gross focal neurologic deficits are appreciated. No gait instability. Skin:  Skin is warm, dry and intact. No rash noted. Psychiatric: Mood and affect are normal. Speech and behavior are normal.  ____________________________________________   LABS (all labs ordered are listed, but only abnormal results are displayed)  Results for orders placed or performed during the hospital encounter of 12/10/15 (from the past 24 hour(s))  Urinalysis complete, with microscopic     Status: Abnormal   Collection Time: 12/10/15  2:25 PM  Result Value Ref Range   Color, Urine YELLOW (A) YELLOW   APPearance CLEAR (A) CLEAR   Glucose, UA NEGATIVE NEGATIVE mg/dL   Bilirubin Urine NEGATIVE NEGATIVE   Ketones, ur NEGATIVE NEGATIVE mg/dL   Specific Gravity, Urine 1.011 1.005 - 1.030   Hgb urine dipstick NEGATIVE NEGATIVE   pH 5.0 5.0 - 8.0   Protein, ur NEGATIVE NEGATIVE mg/dL   Nitrite NEGATIVE NEGATIVE   Leukocytes, UA NEGATIVE NEGATIVE   RBC / HPF 0-5 0 - 5 RBC/hpf   WBC, UA 0-5 0 - 5 WBC/hpf   Bacteria, UA MANY (A) NONE SEEN   Squamous Epithelial / LPF 0-5 (A) NONE SEEN   Mucous PRESENT    Hyaline Casts, UA PRESENT   Lipase, blood     Status: None   Collection Time: 12/10/15  2:26 PM  Result Value Ref Range   Lipase 18 11 - 51 U/L  Comprehensive metabolic panel     Status: Abnormal   Collection Time: 12/10/15  2:26 PM  Result Value Ref Range   Sodium 139 135 - 145 mmol/L   Potassium 4.0 3.5 - 5.1 mmol/L   Chloride 114 (H) 101 - 111 mmol/L   CO2 20 (L) 22 - 32 mmol/L   Glucose, Bld 91 65 - 99 mg/dL   BUN 12 6 - 20 mg/dL   Creatinine, Ser 2.530.67 0.44 - 1.00 mg/dL   Calcium 9.0 8.9 - 66.410.3 mg/dL   Total Protein 6.9 6.5 - 8.1 g/dL   Albumin  4.0 3.5 - 5.0 g/dL   AST 21 15 - 41 U/L   ALT 14 14 - 54 U/L   Alkaline Phosphatase 57 38 - 126 U/L   Total Bilirubin 0.6 0.3 - 1.2 mg/dL   GFR calc non Af Amer >60 >60 mL/min   GFR calc Af Amer >60 >60 mL/min   Anion gap 5 5 - 15  CBC     Status: Abnormal   Collection Time: 12/10/15  2:26 PM  Result Value Ref Range   WBC 8.6 3.6 - 11.0 K/uL   RBC 4.60 3.80 - 5.20 MIL/uL   Hemoglobin 12.9 12.0 - 16.0 g/dL   HCT 40.339.2 47.435.0 - 25.947.0 %   MCV 85.2 80.0 - 100.0 fL   MCH 27.9 26.0 - 34.0 pg   MCHC 32.8 32.0 - 36.0 g/dL   RDW 56.316.0 (H) 87.511.5 -  14.5 %   Platelets 175 150 - 440 K/uL  Wet prep, genital     Status: Abnormal   Collection Time: 12/10/15  5:40 PM  Result Value Ref Range   Yeast Wet Prep HPF POC NONE SEEN NONE SEEN   Trich, Wet Prep NONE SEEN NONE SEEN   Clue Cells Wet Prep HPF POC NONE SEEN NONE SEEN   WBC, Wet Prep HPF POC FEW (A) NONE SEEN   Sperm NONE SEEN    ____________________________________________  EKG____________________________________________  RADIOLOGY   ____________________________________________   PROCEDURES  Procedure(s) performed: none Procedures    Critical Care performed: no ____________________________________________   INITIAL IMPRESSION / ASSESSMENT AND PLAN / ED COURSE  Pertinent labs & imaging results that were available during my care of the patient were reviewed by me and considered in my medical decision making (see chart for details).  DDX: pid, ovarian torsion, cyst, ppendicitis, msk strain, chronic pelvic pain  Tonny Bollmanara Lise Schwegel is a 45 y.o. who presents to the ED with Recurrent right lower quadrant pelvic pain since Thursday. Patient had CT and ultrasound imaging performed at that time which was unremarkable. There is no evidence of abscess. Symptoms have not changed in character. She arrives afebrile and he minimally stable her abdominal exam is not clinically consistent with acute appendicitis or torsion. Pelvic exam performed  evaluate for any evidence of PID or acute infectious process she has a normal-appearing pelvic exam with no adnexal masses or tenderness. Do not feel diagnostic imaging clinically indicated at this time. Patient able to ambulate with a steady gait. Patient stable for follow-up with GYN.  Patient was able to tolerate PO and was able to ambulate with a steady gait.  Have discussed with the patient and available family all diagnostics and treatments performed thus far and all questions were answered to the best of my ability. The patient demonstrates understanding and agreement with plan.   Clinical Course      ____________________________________________   FINAL CLINICAL IMPRESSION(S) / ED DIAGNOSES  Final diagnoses:  Pelvic pain in female      NEW MEDICATIONS STARTED DURING THIS VISIT:  New Prescriptions   No medications on file     Note:  This document was prepared using Dragon voice recognition software and may include unintentional dictation errors.    Willy EddyPatrick Chaney Ingram, MD 12/10/15 [phone]

## 2016-01-17 ENCOUNTER — Other Ambulatory Visit: Payer: Self-pay | Admitting: Internal Medicine

## 2016-01-17 DIAGNOSIS — J45909 Unspecified asthma, uncomplicated: Secondary | ICD-10-CM | POA: Insufficient documentation

## 2016-01-17 DIAGNOSIS — E669 Obesity, unspecified: Secondary | ICD-10-CM | POA: Insufficient documentation

## 2016-01-17 DIAGNOSIS — F1111 Opioid abuse, in remission: Secondary | ICD-10-CM | POA: Insufficient documentation

## 2016-01-23 ENCOUNTER — Ambulatory Visit: Payer: Self-pay | Admitting: Internal Medicine

## 2016-02-10 ENCOUNTER — Emergency Department: Payer: Medicaid Other

## 2016-02-10 ENCOUNTER — Emergency Department
Admission: EM | Admit: 2016-02-10 | Discharge: 2016-02-10 | Disposition: A | Discharge status: Home / Self Care | Payer: Medicaid Other | Attending: Emergency Medicine | Admitting: Emergency Medicine

## 2016-02-10 DIAGNOSIS — W109XXA Fall (on) (from) unspecified stairs and steps, initial encounter: Secondary | ICD-10-CM | POA: Insufficient documentation

## 2016-02-10 DIAGNOSIS — Y92009 Unspecified place in unspecified non-institutional (private) residence as the place of occurrence of the external cause: Secondary | ICD-10-CM | POA: Insufficient documentation

## 2016-02-10 DIAGNOSIS — Y939 Activity, unspecified: Secondary | ICD-10-CM | POA: Diagnosis not present

## 2016-02-10 DIAGNOSIS — S300XXA Contusion of lower back and pelvis, initial encounter: Secondary | ICD-10-CM | POA: Diagnosis not present

## 2016-02-10 DIAGNOSIS — Z791 Long term (current) use of non-steroidal anti-inflammatories (NSAID): Secondary | ICD-10-CM | POA: Insufficient documentation

## 2016-02-10 DIAGNOSIS — Y999 Unspecified external cause status: Secondary | ICD-10-CM | POA: Insufficient documentation

## 2016-02-10 DIAGNOSIS — J189 Pneumonia, unspecified organism: Secondary | ICD-10-CM

## 2016-02-10 DIAGNOSIS — S299XXA Unspecified injury of thorax, initial encounter: Secondary | ICD-10-CM | POA: Diagnosis present

## 2016-02-10 DIAGNOSIS — J181 Lobar pneumonia, unspecified organism: Secondary | ICD-10-CM | POA: Diagnosis not present

## 2016-02-10 DIAGNOSIS — S20211A Contusion of right front wall of thorax, initial encounter: Secondary | ICD-10-CM | POA: Diagnosis not present

## 2016-02-10 DIAGNOSIS — E039 Hypothyroidism, unspecified: Secondary | ICD-10-CM | POA: Diagnosis not present

## 2016-02-10 DIAGNOSIS — Z79899 Other long term (current) drug therapy: Secondary | ICD-10-CM | POA: Diagnosis not present

## 2016-02-10 DIAGNOSIS — J45909 Unspecified asthma, uncomplicated: Secondary | ICD-10-CM | POA: Insufficient documentation

## 2016-02-10 DIAGNOSIS — W19XXXA Unspecified fall, initial encounter: Secondary | ICD-10-CM

## 2016-02-10 MED ORDER — AZITHROMYCIN 250 MG PO TABS: ORAL_TABLET | ORAL | 0 refills | Status: DC

## 2016-02-10 MED ORDER — OXYCODONE-ACETAMINOPHEN 5-325 MG PO TABS
1.0000 | ORAL_TABLET | Freq: Once | ORAL | Status: AC
  Administered 2016-02-10: 1 via ORAL
  Filled 2016-02-10: qty 1

## 2016-02-10 MED ORDER — OXYCODONE-ACETAMINOPHEN 5-325 MG PO TABS: 1.0000 | ORAL_TABLET | ORAL | 0 refills | Status: DC | PRN

## 2016-02-10 MED ORDER — OXYCODONE-ACETAMINOPHEN 5-325 MG PO TABS
1.0000 | ORAL_TABLET | Freq: Once | ORAL | Status: AC
  Administered 2016-02-10: 1 via ORAL

## 2016-02-10 MED ORDER — OXYCODONE-ACETAMINOPHEN 5-325 MG PO TABS
ORAL_TABLET | ORAL | Status: AC
  Filled 2016-02-10: qty 1

## 2016-02-10 NOTE — ED Notes (Signed)
See triage note  States she fell yesterday  Landed on tailbone  Having increased pain with walking,sitting and trying to use the bathroom  Also has had cough for couple of days with slight fever  Afebrile on arrival

## 2016-02-10 NOTE — ED Notes (Signed)
Pt given percocet as ordered for pain and moved to recliner in subwait for her comfort; call bell in reach; pt appreciative

## 2016-02-10 NOTE — Discharge Instructions (Signed)
Begin taking antibiotics today. Zithromax 2 tablets today and then one tablet every day for a total of 5 days. This antibiotic will be in your body for 10 days. Percocet as needed for pain, only as directed. You will need to call and make an appointment at [facility] for 3 weeks for reevaluation of your pneumonia. You may apply ice to your tailbone as needed for pain. You may also need to sit on a pillow for several weeks

## 2016-02-10 NOTE — ED Provider Notes (Signed)
[facility] Emergency Department Provider Note   ____________________________________________   First MD Initiated Contact with Patient 02/10/16 0701     (approximate)  I have reviewed the triage vital signs and the nursing notes.   HISTORY  Chief Complaint Fall   HPI Rhonda Palmer is a 46 y.o. female patient was brought to the emergency room via EMS from home after she fell yesterday down 3 steps and landed on her buttocks. Patient continued to be ambulatory since that time. She states she woke up this morning with more pain then she had the day before. She states that the pain is so severe that she is afraid to have bowel movement because she is afraid she fractured her tailbone. She is able to urinate without any difficulty and has not seen any blood. She also hit the left side of her rib cage. This area continues to hurt. Patient states she does not have any difficulty breathing. Patient has had upper respiratory symptoms and has been fighting a cold. She is unaware of any prior fractures. Currently she rates her pain as an 8/10.   Past Medical History:  Diagnosis Date  . Edema leg for twenty years per patient   taking lasix for edema  . Fibromyalgia   . Ovarian cyst   . Thyroid disease     Patient Active Problem List   Diagnosis Date Noted  . Obesity 01/17/2016  . History of opioid abuse 01/17/2016  . Asthma 01/17/2016  . Mixed hyperlipidemia 10/09/2015  . Hypokalemia 10/09/2015  . Headache above the eye region 06/16/2013  . Edema extremities 05/18/2013  . 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 GERD (gastroesophageal reflux disease) 04/11/2013  . Personality disorder 10/16/2012  . Chronic pain syndrome 06/18/2012  . S/P ankle fusion 05/21/2012  . Schizoaffective disorder, bipolar type (HCC) 02/20/2012  . 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 Hypothyroidism 11/16/2002    Past Surgical History:  Procedure  Laterality Date  . ANKLE SURGERY     pt. states long time ago    Prior to Admission medications   Medication Sig Start Date End Date Taking? Authorizing Provider  ARIPiprazole (ABILIFY) 10 MG tablet Take 10 mg by mouth daily.    Historical Provider, MD  azithromycin (ZITHROMAX Z-PAK) 250 MG tablet Take 2 tablets (500 mg) on  Day 1,  followed by 1 tablet (250 mg) once daily on Days 2 through 5. 02/10/16   Tommi Rumps, PA-C  furosemide (LASIX) 20 MG tablet Take 20 mg by mouth daily.    Historical Provider, MD  haloperidol (HALDOL) 10 MG tablet Take 10 mg by mouth 4 (four) times daily.    Historical Provider, MD  ibuprofen (ADVIL,MOTRIN) 600 MG tablet Take 600 mg by mouth every 6 (six) hours as needed.    Historical Provider, MD  levothyroxine (SYNTHROID, LEVOTHROID) 100 MCG tablet Take 100 mcg by mouth daily before breakfast.    Historical Provider, MD  oxyCODONE-acetaminophen (PERCOCET) 5-325 MG tablet Take 1 tablet by mouth every 4 (four) hours as needed for severe pain. 02/10/16   Tommi Rumps, PA-C  pantoprazole (PROTONIX) 20 MG tablet Take 40 mg by mouth daily.    Historical Provider, MD  topiramate (TOPAMAX) 100 MG tablet Take 100 mg by mouth 2 (two) times daily.    Historical Provider, MD  venlafaxine (EFFEXOR) 100 MG tablet Take 100 mg by mouth 2 (two) times daily.    Historical Provider, MD  Allergies Codeine; Sulfa antibiotics; Penicillins; and Aspirin  Family History  Problem Relation Age of Onset  . Heart failure Mother     Social History Social History  Substance Use Topics  . Smoking status: Never Smoker  . Smokeless tobacco: Never Used  . Alcohol use No    Review of Systems Constitutional: Unknown fever/chills Eyes: No visual changes. ENT: Positive nasal congestion. Cardiovascular: Denies chest pain. Respiratory: Denies shortness of breath. Positive nonproductive cough. Gastrointestinal: No abdominal pain.  No nausea, no vomiting.  No diarrhea.    Musculoskeletal: Positive coccyx pain. Positive left rib pain. Skin: Negative for rash. Neurological: Negative for headaches, focal weakness or numbness. Psychiatric:Positive schizoaffective disorder per records.  10-point ROS otherwise negative.  ____________________________________________   PHYSICAL EXAM:  VITAL SIGNS: ED Triage Vitals  Enc Vitals Group     BP 02/10/16 0549 104/72     Pulse Rate 02/10/16 0549 (!) 101     Resp 02/10/16 0549 18     Temp 02/10/16 0549 99.8 F (37.7 C)     Temp Source 02/10/16 0549 Oral     SpO2 02/10/16 0549 99 %     Weight 02/10/16 0541 182 lb (82.6 kg)     Height 02/10/16 0541 5\' 1"  (1.549 m)     Head Circumference --      Peak Flow --      Pain Score 02/10/16 0541 8     Pain Loc --      Pain Edu? --      Excl. in GC? --     Constitutional: Alert and oriented. Well appearing and in no acute distress. Eyes: Conjunctivae are normal. PERRL. EOMI. Head: Atraumatic. Nose: Mild congestion/no rhinnorhea.  EACs and TMs clear bilaterally. Mouth/Throat: Mucous membranes are moist.  Oropharynx non-erythematous. Neck: No stridor.   Hematological/Lymphatic/Immunilogical: No cervical lymphadenopathy. Cardiovascular: Normal rate, regular rhythm. Grossly normal heart sounds.  Good peripheral circulation. Respiratory: Normal respiratory effort.  No retractions. Lungs CTAB occasional dry cough was noted during exam. Gastrointestinal: Soft and nontender. No distention. Musculoskeletal: On examination of the left ribs there is no gross deformity and no soft tissue swelling present. There is no ecchymosis or abrasions seen. There is marked tenderness on palpation of the left posterior ribs. On examination of the sacrum and coccyx there is no soft tissue swelling, abrasions, ecchymosis noted. There is marked tenderness on light palpation in this area. Range of motion is guarded secondary to pain. Patient is ambulatory without assistance. Neurologic:  Normal  speech and language. No gross focal neurologic deficits are appreciated. No gait instability. Skin:  Skin is warm, dry and intact. As noted above. Psychiatric: Mood and affect are normal. Speech and behavior are normal.  ____________________________________________   LABS (all labs ordered are listed, but only abnormal results are displayed)  Labs Reviewed - No data to display   RADIOLOGY Sacral coccyx x-ray per radiologist was negative. Chest x-ray per radiologist: IMPRESSION:  Postoperative changes on the right. Nodular infiltration in the left  upper lung probably represents pneumonia. Followup PA and lateral  chest X-ray is recommended in 3-4 weeks following appropriate  clinical therapy to ensure resolution and exclude underlying  malignancy.   I, Tommi Rumpshonda L Summers, personally viewed and evaluated these images (plain radiographs) as part of my medical decision making, as well as reviewing the written report by the radiologist. ____________________________________________   PROCEDURES  Procedure(s) performed: None  Procedures  Critical Care performed: No  ____________________________________________   INITIAL IMPRESSION / ASSESSMENT  AND PLAN / ED COURSE  Pertinent labs & imaging results that were available during my care of the patient were reviewed by me and considered in my medical decision making (see chart for details).    Clinical Course    In talking with patient she has had a upper respiratory infection and cough. We discussed her left-sided pneumonia which may be causing her left-sided rib pain. Patient does have a primary care doctor and is strongly encouraged to follow-up with her doctor in approximately 3 weeks. She was made aware that if this time she still continued to have problems that most likely she would have a CT scan done of her chest. Patient was given a prescription for a Zithromax pack to be taken as instructed. She is also given a prescription  for Percocet as needed for pain. She is to call [facility] on Tuesday and make an appointment for 3 weeks. She is also told she could use ice and a pillow to sit on which may help with some of her symptoms.  ____________________________________________   FINAL CLINICAL IMPRESSION(S) / ED DIAGNOSES  Final diagnoses:  Contusion of coccyx, initial encounter  Chest wall contusion, right, initial encounter  Fall in home, initial encounter  Community acquired pneumonia of left upper lobe of lung (HCC)      NEW MEDICATIONS STARTED DURING THIS VISIT:  Discharge Medication List as of 02/10/2016  7:54 AM    START taking these medications   Details  azithromycin (ZITHROMAX Z-PAK) 250 MG tablet Take 2 tablets (500 mg) on  Day 1,  followed by 1 tablet (250 mg) once daily on Days 2 through 5., Print         Note:  This document was prepared using Dragon voice recognition software and may include unintentional dictation errors.    Tommi Rumps, PA-C 02/10/16 1611    Governor Rooks, MD 02/11/16 0730

## 2016-02-10 NOTE — ED Triage Notes (Signed)
Pt arrived via EMS from home; says she fell yesterday morning down 3 steps and landed on her bottom; has been ambulatory since; pt says pain too much to have bowel movement this am and thinks she may have fractured her tailbone; able to void without difficulty; also hit left flank area/lower left rib cage;

## 2016-03-20 ENCOUNTER — Emergency Department
Admission: EM | Admit: 2016-03-20 | Discharge: 2016-03-20 | Disposition: A | Discharge status: Home / Self Care | Payer: Medicaid Other | Attending: Emergency Medicine | Admitting: Emergency Medicine

## 2016-03-20 DIAGNOSIS — K0889 Other specified disorders of teeth and supporting structures: Secondary | ICD-10-CM | POA: Diagnosis present

## 2016-03-20 DIAGNOSIS — E039 Hypothyroidism, unspecified: Secondary | ICD-10-CM | POA: Diagnosis not present

## 2016-03-20 DIAGNOSIS — J45909 Unspecified asthma, uncomplicated: Secondary | ICD-10-CM | POA: Insufficient documentation

## 2016-03-20 DIAGNOSIS — Z79899 Other long term (current) drug therapy: Secondary | ICD-10-CM | POA: Insufficient documentation

## 2016-03-20 DIAGNOSIS — K029 Dental caries, unspecified: Secondary | ICD-10-CM | POA: Insufficient documentation

## 2016-03-20 MED ORDER — KETOROLAC TROMETHAMINE 10 MG PO TABS
ORAL_TABLET | ORAL | Status: AC
  Administered 2016-03-20: 10 mg via ORAL
  Filled 2016-03-20: qty 1

## 2016-03-20 MED ORDER — CLINDAMYCIN HCL 150 MG PO CAPS
300.0000 mg | ORAL_CAPSULE | Freq: Once | ORAL | Status: AC
  Administered 2016-03-20: 300 mg via ORAL
  Filled 2016-03-20: qty 2

## 2016-03-20 MED ORDER — OXYCODONE-ACETAMINOPHEN 5-325 MG PO TABS
1.0000 | ORAL_TABLET | Freq: Once | ORAL | Status: AC
  Administered 2016-03-20: 1 via ORAL

## 2016-03-20 MED ORDER — OXYCODONE-ACETAMINOPHEN 5-325 MG PO TABS: 1.0000 | ORAL_TABLET | Freq: Four times a day (QID) | ORAL | 0 refills | Status: DC | PRN

## 2016-03-20 MED ORDER — LIDOCAINE VISCOUS 2 % MT SOLN
15.0000 mL | Freq: Once | OROMUCOSAL | Status: AC
  Administered 2016-03-20: 15 mL via OROMUCOSAL
  Filled 2016-03-20: qty 15

## 2016-03-20 MED ORDER — OXYCODONE-ACETAMINOPHEN 5-325 MG PO TABS
ORAL_TABLET | ORAL | Status: AC
  Administered 2016-03-20: 1 via ORAL
  Filled 2016-03-20: qty 1

## 2016-03-20 MED ORDER — CLINDAMYCIN HCL 300 MG PO CAPS: 300.0000 mg | ORAL_CAPSULE | Freq: Three times a day (TID) | ORAL | 0 refills | Status: AC

## 2016-03-20 MED ORDER — KETOROLAC TROMETHAMINE 10 MG PO TABS
10.0000 mg | ORAL_TABLET | Freq: Once | ORAL | Status: AC
  Administered 2016-03-20: 10 mg via ORAL

## 2016-03-20 MED ORDER — CLINDAMYCIN HCL 300 MG PO CAPS: 300.0000 mg | ORAL_CAPSULE | Freq: Three times a day (TID) | ORAL | 0 refills | Status: DC

## 2016-03-20 MED ORDER — LIDOCAINE-EPINEPHRINE 2 %-1:100000 IJ SOLN
INTRAMUSCULAR | Status: AC
  Filled 2016-03-20: qty 1.7

## 2016-03-20 NOTE — ED Provider Notes (Addendum)
[facility] Emergency Department Provider Note   First MD Initiated Contact with Patient 03/20/16 [phone]     (approximate)  I have reviewed the triage vital signs and the nursing notes.   HISTORY  Chief Complaint Dental Pain   HPI Tonny Bollmanara Lise Halley is a 46 y.o. female with bolus of chronic medical conditions presents to the emergency department with left upper molar dental pain 2 days. Patient states her current pain score is 10 out of 10. Patient denies any fever no difficulty swallowing.   Past Medical History:  Diagnosis Date  . Edema leg for twenty years per patient   taking lasix for edema  . Fibromyalgia   . Ovarian cyst   . Thyroid disease     Patient Active Problem List   Diagnosis Date Noted  . Obesity 01/17/2016  . History of opioid abuse 01/17/2016  . Asthma 01/17/2016  . Mixed hyperlipidemia 10/09/2015  . Hypokalemia 10/09/2015  . Headache above the eye region 06/16/2013  . Edema extremities 05/18/2013  . 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 GERD (gastroesophageal reflux disease) 04/11/2013  . Personality disorder 10/16/2012  . Chronic pain syndrome 06/18/2012  . S/P ankle fusion 05/21/2012  . Schizoaffective disorder, bipolar type (HCC) 02/20/2012  . 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 Hypothyroidism 11/16/2002    Past Surgical History:  Procedure Laterality Date  . ANKLE SURGERY     pt. states long time ago    Prior to Admission medications   Medication Sig Start Date End Date Taking? Authorizing Provider  ARIPiprazole (ABILIFY) 10 MG tablet Take 10 mg by mouth daily.    Historical Provider, MD  azithromycin (ZITHROMAX Z-PAK) 250 MG tablet Take 2 tablets (500 mg) on  Day 1,  followed by 1 tablet (250 mg) once daily on Days 2 through 5. 02/10/16   Tommi Rumpshonda L Summers, PA-C  clindamycin (CLEOCIN) 300 MG capsule Take 1 capsule (300 mg total) by mouth 3 (three) times daily. 03/20/16 03/30/16  Darci Currentandolph N  Brown, MD  furosemide (LASIX) 20 MG tablet Take 20 mg by mouth daily.    Historical Provider, MD  haloperidol (HALDOL) 10 MG tablet Take 10 mg by mouth 4 (four) times daily.    Historical Provider, MD  ibuprofen (ADVIL,MOTRIN) 600 MG tablet Take 600 mg by mouth every 6 (six) hours as needed.    Historical Provider, MD  levothyroxine (SYNTHROID, LEVOTHROID) 100 MCG tablet Take 100 mcg by mouth daily before breakfast.    Historical Provider, MD  oxyCODONE-acetaminophen (PERCOCET) 5-325 MG tablet Take 1 tablet by mouth every 4 (four) hours as needed for severe pain. 02/10/16   Tommi Rumpshonda L Summers, PA-C  oxyCODONE-acetaminophen (ROXICET) 5-325 MG tablet Take 1 tablet by mouth every 6 (six) hours as needed for severe pain. 03/20/16   Darci Currentandolph N Brown, MD  pantoprazole (PROTONIX) 20 MG tablet Take 40 mg by mouth daily.    Historical Provider, MD  topiramate (TOPAMAX) 100 MG tablet Take 100 mg by mouth 2 (two) times daily.    Historical Provider, MD  venlafaxine (EFFEXOR) 100 MG tablet Take 100 mg by mouth 2 (two) times daily.    Historical Provider, MD    Allergies Codeine; Sulfa antibiotics; Penicillins; and Aspirin  Family History  Problem Relation Age of Onset  . Heart failure Mother     Social History Social History  Substance Use Topics  . Smoking status: Never Smoker  . Smokeless tobacco: Never Used  . Alcohol  use No    Review of Systems Constitutional: No fever/chills Eyes: No visual changes. ENT: No sore throat. Cardiovascular: Denies chest pain. Respiratory: Denies shortness of breath. Gastrointestinal: No abdominal pain.  No nausea, no vomiting.  No diarrhea.  No constipation. Genitourinary: Negative for dysuria. Musculoskeletal: Negative for back pain. Skin: Negative for rash. Neurological: Negative for headaches, focal weakness or numbness.  10-point ROS otherwise negative.  ____________________________________________   PHYSICAL EXAM:  VITAL SIGNS: ED Triage Vitals    Enc Vitals Group     BP 03/20/16 0436 115/74     Pulse Rate 03/20/16 0436 87     Resp 03/20/16 0436 18     Temp 03/20/16 0436 98 F (36.7 C)     Temp Source 03/20/16 0436 Oral     SpO2 03/20/16 0436 99 %     Weight 03/20/16 0436 197 lb (89.4 kg)     Height 03/20/16 0436 5' (1.524 m)     Head Circumference --      Peak Flow --      Pain Score 03/20/16 0437 8     Pain Loc --      Pain Edu? --      Excl. in GC? --     Constitutional: Alert and oriented. Well appearing and in no acute distress. Eyes: Conjunctivae are normal. PERRL. EOMI. Head: Atraumatic. Mouth/Throat: Mucous membranes are moist.  Oropharynx non-erythematous.Left posterior molar dental carry Neck: No stridor.  Neurologic:  Normal speech and language. No gross focal neurologic deficits are appreciated.  Skin:  Skin is warm, dry and intact. No rash noted.   ____________________________________________   LABS (all labs ordered are listed, but only abnormal results are displayed)  Labs Reviewed - No data to display ____________________________________________  Procedures    INITIAL IMPRESSION / ASSESSMENT AND PLAN / ED COURSE  Pertinent labs & imaging results that were available during my care of the patient were reviewed by me and considered in my medical decision making (see chart for details).        ____________________________________________  FINAL CLINICAL IMPRESSION(S) / ED DIAGNOSES  Final diagnoses:  Dental caries     MEDICATIONS GIVEN DURING THIS VISIT:  Medications  lidocaine (XYLOCAINE) 2 % viscous mouth solution 15 mL (15 mLs Mouth/Throat Given 03/20/16 0501)  clindamycin (CLEOCIN) capsule 300 mg (300 mg Oral Given 03/20/16 0500)  oxyCODONE-acetaminophen (PERCOCET/ROXICET) 5-325 MG per tablet 1 tablet (1 tablet Oral Given 03/20/16 0535)     NEW OUTPATIENT MEDICATIONS STARTED DURING THIS VISIT:  New Prescriptions   CLINDAMYCIN (CLEOCIN) 300 MG CAPSULE    Take 1 capsule (300 mg  total) by mouth 3 (three) times daily.   OXYCODONE-ACETAMINOPHEN (ROXICET) 5-325 MG TABLET    Take 1 tablet by mouth every 6 (six) hours as needed for severe pain.    Modified Medications   No medications on file    Discontinued Medications   No medications on file     Note:  This document was prepared using Dragon voice recognition software and may include unintentional dictation errors.    Darci Current, MD 03/20/16 [id number]    Darci Current, MD 03/20/16 [phone]

## 2016-03-20 NOTE — ED Notes (Signed)
Pt states she can take "percocet" for pain, denies having a rxn to this medication.

## 2016-03-20 NOTE — ED Notes (Signed)
Pt called out stating "can I get another pain med", pt does report decreased pain. MD made aware.

## 2016-03-20 NOTE — ED Notes (Signed)
Pt is using phone to call for ride.

## 2016-03-20 NOTE — ED Triage Notes (Addendum)
Per EMS, pt reports dental pain beginning on Tuesday, reports increased pain since. Pt states she did not have a ride to go see the dentist and with the increased pain she has this am she called EMS. EMS reports vital signs are WNL. Pt states hx of cavities and reports this pain feels similar.

## 2016-03-20 NOTE — ED Notes (Signed)
Pt reports upper left tooth pain. Pt states hx of cavities. Pt also reports swelling to area as well.

## 2016-03-20 NOTE — ED Notes (Signed)
Pt was asked if she can take Toradol, pt states she can take Toradol and has taken it in the past without difficulties.

## 2016-05-05 ENCOUNTER — Emergency Department
Admission: EM | Admit: 2016-05-05 | Discharge: 2016-05-06 | Disposition: A | Discharge status: Home / Self Care | Payer: Medicaid Other | Attending: Emergency Medicine | Admitting: Emergency Medicine

## 2016-05-05 ENCOUNTER — Emergency Department: Payer: Medicaid Other

## 2016-05-05 ENCOUNTER — Encounter: Payer: Self-pay | Admitting: Emergency Medicine

## 2016-05-05 DIAGNOSIS — Z79899 Other long term (current) drug therapy: Secondary | ICD-10-CM | POA: Insufficient documentation

## 2016-05-05 DIAGNOSIS — E039 Hypothyroidism, unspecified: Secondary | ICD-10-CM | POA: Diagnosis not present

## 2016-05-05 DIAGNOSIS — R05 Cough: Secondary | ICD-10-CM | POA: Insufficient documentation

## 2016-05-05 DIAGNOSIS — J45909 Unspecified asthma, uncomplicated: Secondary | ICD-10-CM | POA: Diagnosis not present

## 2016-05-05 DIAGNOSIS — R0602 Shortness of breath: Secondary | ICD-10-CM | POA: Insufficient documentation

## 2016-05-05 DIAGNOSIS — R079 Chest pain, unspecified: Secondary | ICD-10-CM

## 2016-05-05 DIAGNOSIS — R0789 Other chest pain: Secondary | ICD-10-CM | POA: Diagnosis present

## 2016-05-05 HISTORY — DX: Unspecified asthma, uncomplicated: J45.909

## 2016-05-05 LAB — CBC
HEMATOCRIT: 31.7 % — AB (ref 35.0–47.0)
HEMOGLOBIN: 10.3 g/dL — AB (ref 12.0–16.0)
MCH: 29.3 pg (ref 26.0–34.0)
MCHC: 32.6 g/dL (ref 32.0–36.0)
MCV: 89.9 fL (ref 80.0–100.0)
Platelets: 278 10*3/uL (ref 150–440)
RBC: 3.53 MIL/uL — ABNORMAL LOW (ref 3.80–5.20)
RDW: 15 % — AB (ref 11.5–14.5)
WBC: 7.5 10*3/uL (ref 3.6–11.0)

## 2016-05-05 LAB — TROPONIN I: Troponin I: <0.03 ng/mL (ref <0.03)

## 2016-05-05 LAB — BASIC METABOLIC PANEL
Anion gap: 6 (ref 5–15)
BUN: 13 mg/dL (ref 6–20)
CHLORIDE: 108 mmol/L (ref 101–111)
CO2: 24 mmol/L (ref 22–32)
Calcium: 8.2 mg/dL — ABNORMAL LOW (ref 8.9–10.3)
Creatinine, Ser: 0.55 mg/dL (ref 0.44–1.00)
GFR calc Af Amer: >60 mL/min (ref >60)
GFR calc non Af Amer: >60 mL/min (ref >60)
GLUCOSE: 149 mg/dL — AB (ref 65–99)
POTASSIUM: 3.8 mmol/L (ref 3.5–5.1)
Sodium: 138 mmol/L (ref 135–145)

## 2016-05-05 MED ORDER — ONDANSETRON HCL 4 MG/2ML IJ SOLN
4.0000 mg | Freq: Once | INTRAMUSCULAR | Status: AC
Start: 2016-05-05 — End: 2016-05-05
  Administered 2016-05-05: 4 mg via INTRAVENOUS
  Filled 2016-05-05: qty 2

## 2016-05-05 MED ORDER — FENTANYL CITRATE (PF) 100 MCG/2ML IJ SOLN
50.0000 ug | Freq: Once | INTRAMUSCULAR | Status: AC
  Administered 2016-05-05: 50 ug via INTRAVENOUS
  Filled 2016-05-05: qty 2

## 2016-05-05 MED ORDER — HYDROMORPHONE HCL 1 MG/ML IJ SOLN
0.5000 mg | Freq: Once | INTRAMUSCULAR | Status: AC
  Administered 2016-05-05: 0.5 mg via INTRAVENOUS
  Filled 2016-05-05: qty 1

## 2016-05-05 MED ORDER — IOPAMIDOL (ISOVUE-370) INJECTION 76%
75.0000 mL | Freq: Once | INTRAVENOUS | Status: AC | PRN
  Administered 2016-05-05: 75 mL via INTRAVENOUS

## 2016-05-05 NOTE — Discharge Instructions (Signed)
You have been seen in the emergency department today for chest pain. Your workup has shown normal results. As we discussed please follow-up with your primary care physician in the next 1-2 days for recheck. Return to the emergency department for any further chest pain, trouble breathing, or any other symptom personally concerning to yourself. °Please call the number provided for cardiology to arrange a follow-up appointment for possible stress test. °

## 2016-05-05 NOTE — ED Notes (Signed)
Patient transported to CT 

## 2016-05-05 NOTE — ED Triage Notes (Signed)
Pt to ED via EMS from home with c/o CP that started today. EMS gave 1 subling nitro and pressure was relieved. Pt denies any SOB. Pt  A&Ox4.

## 2016-05-05 NOTE — ED Provider Notes (Addendum)
[facility] Emergency Department Provider Note  Time seen: 9:41 PM  I have reviewed the triage vital signs and the nursing notes.   HISTORY  Chief Complaint Chest Pain    HPI Rhonda Palmer is a 46 y.o. female with a past medical history of peripheral edema, fibromyalgia, obesity, presents to the emergency department with sudden onset of left chest pain.  Patient states approximately 2-2-1/2 hours prior to arrival she was at home when she suddenly developed left-sided chest pain with a cough and shortness of breath. Patient states it felt like a sharp pain located behind her left breast radiating toward the center of her chest. States it was somewhat worse if she takes a deep breath. She states the pain did not appear to be relieving so she called EMS. EMS gave the patient nitroglycerin which she states helped her discomfort somewhat. She is allergic to aspirin. Here the patient states continued moderate left-sided chest pain worse with deep inspiration. States mild cough but denies any sputum production or fever. States the cough just started 2 hours ago when the chest pain began. Patient does have a history of a prior DVT during her pregnancy 10 years ago but her anticoagulation was discontinued after her pregnancy.  Past Medical History:  Diagnosis Date  . Edema leg for twenty years per patient   taking lasix for edema  . Fibromyalgia   . Ovarian cyst   . Thyroid disease     Patient Active Problem List   Diagnosis Date Noted  . Obesity 01/17/2016  . History of opioid abuse 01/17/2016  . Asthma 01/17/2016  . Mixed hyperlipidemia 10/09/2015  . Hypokalemia 10/09/2015  . Headache above the eye region 06/16/2013  . Edema extremities 05/18/2013  . 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 GERD (gastroesophageal reflux disease) 04/11/2013  . Personality disorder 10/16/2012  . Chronic pain syndrome 06/18/2012  . S/P ankle fusion 05/21/2012  .  Schizoaffective disorder, bipolar type (HCC) 02/20/2012  . 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 Hypothyroidism 11/16/2002    Past Surgical History:  Procedure Laterality Date  . ANKLE SURGERY     pt. states long time ago    Prior to Admission medications   Medication Sig Start Date End Date Taking? Authorizing Provider  ARIPiprazole (ABILIFY) 10 MG tablet Take 10 mg by mouth daily.    Historical Provider, MD  azithromycin (ZITHROMAX Z-PAK) 250 MG tablet Take 2 tablets (500 mg) on  Day 1,  followed by 1 tablet (250 mg) once daily on Days 2 through 5. 02/10/16   Tommi Rumps, PA-C  furosemide (LASIX) 20 MG tablet Take 20 mg by mouth daily.    Historical Provider, MD  haloperidol (HALDOL) 10 MG tablet Take 10 mg by mouth 4 (four) times daily.    Historical Provider, MD  ibuprofen (ADVIL,MOTRIN) 600 MG tablet Take 600 mg by mouth every 6 (six) hours as needed.    Historical Provider, MD  levothyroxine (SYNTHROID, LEVOTHROID) 100 MCG tablet Take 100 mcg by mouth daily before breakfast.    Historical Provider, MD  oxyCODONE-acetaminophen (PERCOCET) 5-325 MG tablet Take 1 tablet by mouth every 4 (four) hours as needed for severe pain. 02/10/16   Tommi Rumps, PA-C  pantoprazole (PROTONIX) 20 MG tablet Take 40 mg by mouth daily.    Historical Provider, MD  topiramate (TOPAMAX) 100 MG tablet Take 100 mg by mouth 2 (two) times daily.    Historical Provider, MD  venlafaxine (  EFFEXOR) 100 MG tablet Take 100 mg by mouth 2 (two) times daily.    Historical Provider, MD    Allergies  Allergen Reactions  . Codeine Hives, Itching, Rash and Swelling  . Sulfa Antibiotics Anaphylaxis  . Penicillins Itching  . Aspirin Rash    Family History  Problem Relation Age of Onset  . Heart failure Mother     Social History Social History  Substance Use Topics  . Smoking status: Never Smoker  . Smokeless tobacco: Never Used  . Alcohol use No    Review of Systems Constitutional:  Negative for fever. Cardiovascular: Moderate left chest pain which she describes as sharp Respiratory: Mild shortness of breath Gastrointestinal: Negative for abdominal pain Neurological: Negative for headache 10-point ROS otherwise negative.  ____________________________________________   PHYSICAL EXAM:  VITAL SIGNS: ED Triage Vitals  Enc Vitals Group     BP 05/05/16 2118 118/77     Pulse Rate 05/05/16 2118 96     Resp 05/05/16 2118 16     Temp 05/05/16 2118 98.1 F (36.7 C)     Temp Source 05/05/16 2118 Oral     SpO2 05/05/16 2118 98 %     Weight 05/05/16 2116 197 lb (89.4 kg)     Height 05/05/16 2116  (1.549 m)     Head Circumference --      Peak Flow --      Pain Score 05/05/16 2116 7     Pain Loc --      Pain Edu? --      Excl. in GC? --     Constitutional: Alert and oriented. Well appearing and in no distress. Eyes: Normal exam ENT   Head: Normocephalic and atraumatic.   Mouth/Throat: Mucous membranes are moist. Cardiovascular: Normal rate, regular rhythm, around 100 bpm. No murmur. Respiratory: Normal respiratory effort without tachypnea nor retractions. Breath sounds are clear Gastrointestinal: Soft and nontender. No distention Musculoskeletal: Nontender with normal range of motion in all extremities. Mild lower extremity edema equal bilaterally. No Tenderness. Neurologic:  Normal speech and language. No gross focal neurologic deficits Skin:  Skin is warm, dry and intact.  Psychiatric: Mood and affect are normal.   ____________________________________________    EKG  EKG reviewed and interpreted by myself shows normal sinus rhythm at 98 bpm, narrow QRS, normal axis, normal intervals, nonspecific ST changes without ST elevation.  Repeat EKG reviewed and interpreted by myself shows normal sinus rhythm at 84 bpm, narrow QRS, normal axis, normal intervals, nonspecific ST changes without ST elevation.  ____________________________________________     RADIOLOGY  CT angiography negative  ____________________________________________   INITIAL IMPRESSION / ASSESSMENT AND PLAN / ED COURSE  Pertinent labs & imaging results that were available during my care of the patient were reviewed by me and considered in my medical decision making (see chart for details).  Patient presents to the emergency department with sudden onset chest discomfort shortness of breath and cough. Patient's symptoms are concerning for a possible pulmonary embolus. I discussed this with the patient and she is agreeable to CT angiography. We will check labs including troponin. We'll obtain a CT angiography of the chest. Patient's EKG is reassuring. Patient is in no distress does state moderate discomfort.  CT angiography is negative. Labs are within normal limits including negative troponin. We'll obtain a repeat troponin around midnight. If repeat troponin is negative patient will be discharged home with cardiology follow-up for expressed has. Patient is agreeable to this plan.  Patient care  signed out to oncoming physician.  ____________________________________________   FINAL CLINICAL IMPRESSION(S) / ED DIAGNOSES  Chest pain    Minna Antis, MD 05/05/16 2254    Minna Antis, MD 05/09/16 1513

## 2016-05-06 LAB — TROPONIN I: Troponin I: <0.03 ng/mL (ref <0.03)

## 2016-05-06 MED ORDER — ETODOLAC 200 MG PO CAPS: 200.0000 mg | ORAL_CAPSULE | Freq: Three times a day (TID) | ORAL | 0 refills | Status: DC

## 2016-05-06 MED ORDER — GI COCKTAIL ~~LOC~~
30.0000 mL | Freq: Once | ORAL | Status: AC
  Administered 2016-05-06: 30 mL via ORAL
  Filled 2016-05-06: qty 30

## 2016-05-06 NOTE — ED Provider Notes (Signed)
-----------------------------------------   1:00 AM on 05/06/2016 -----------------------------------------   Blood pressure 107/73, pulse 89, temperature 98.1 F (36.7 C), temperature source Oral, resp. rate 18, height  (1.549 m), weight 197 lb (89.4 kg), SpO2 97 %.  Assuming care from Dr. Lenard Lance.  In short, Rhonda Palmer is a 46 y.o. female with a chief complaint of Chest Pain .  Refer to the original H&P for additional details.  The current plan of care is to follow up the results of the repeat troponin.     The patient's repeat troponin is negative. The patient will be discharged home to follow-up as discussed by Dr. Elmarie Shiley, MD 05/06/16 0100

## 2016-05-06 NOTE — ED Notes (Signed)
Pt discharged to home.  Family member driving.  Discharge instructions reviewed.  Verbalized understanding.  No questions or concerns at this time.  Teach back verified.  Pt in NAD.  No items left in ED.   

## 2016-05-06 NOTE — ED Notes (Signed)
Pt called out saying she had spilled water on the bed, and "undo all the wires"; this RN went into the room, and complete linen change and placed back on the tele monitor; pt c/o 7/10 pain; MD made aware

## 2016-05-13 ENCOUNTER — Emergency Department
Admission: EM | Admit: 2016-05-13 | Discharge: 2016-05-13 | Disposition: A | Discharge status: Home / Self Care | Payer: Medicaid Other | Attending: Emergency Medicine | Admitting: Emergency Medicine

## 2016-05-13 ENCOUNTER — Encounter: Payer: Self-pay | Admitting: Emergency Medicine

## 2016-05-13 ENCOUNTER — Emergency Department: Payer: Medicaid Other

## 2016-05-13 ENCOUNTER — Ambulatory Visit: Payer: Medicaid Other | Admitting: Pain Medicine

## 2016-05-13 DIAGNOSIS — J4521 Mild intermittent asthma with (acute) exacerbation: Secondary | ICD-10-CM | POA: Insufficient documentation

## 2016-05-13 DIAGNOSIS — E039 Hypothyroidism, unspecified: Secondary | ICD-10-CM | POA: Diagnosis not present

## 2016-05-13 DIAGNOSIS — R0602 Shortness of breath: Secondary | ICD-10-CM | POA: Diagnosis not present

## 2016-05-13 DIAGNOSIS — R079 Chest pain, unspecified: Secondary | ICD-10-CM | POA: Diagnosis present

## 2016-05-13 DIAGNOSIS — Z79899 Other long term (current) drug therapy: Secondary | ICD-10-CM | POA: Diagnosis not present

## 2016-05-13 LAB — BASIC METABOLIC PANEL
ANION GAP: 6 (ref 5–15)
BUN: 8 mg/dL (ref 6–20)
CALCIUM: 8.8 mg/dL — AB (ref 8.9–10.3)
CO2: 30 mmol/L (ref 22–32)
CREATININE: 0.57 mg/dL (ref 0.44–1.00)
Chloride: 98 mmol/L — ABNORMAL LOW (ref 101–111)
GFR calc Af Amer: >60 mL/min (ref >60)
GLUCOSE: 94 mg/dL (ref 65–99)
Potassium: 3.4 mmol/L — ABNORMAL LOW (ref 3.5–5.1)
Sodium: 134 mmol/L — ABNORMAL LOW (ref 135–145)

## 2016-05-13 LAB — CBC WITH DIFFERENTIAL/PLATELET
BASOS PCT: 1 %
Basophils Absolute: 0.1 10*3/uL (ref 0–0.1)
EOS ABS: 1.1 10*3/uL — AB (ref 0–0.7)
EOS PCT: 14 %
HCT: 33.6 % — ABNORMAL LOW (ref 35.0–47.0)
HEMOGLOBIN: 11 g/dL — AB (ref 12.0–16.0)
Lymphocytes Relative: 20 %
Lymphs Abs: 1.6 10*3/uL (ref 1.0–3.6)
MCH: 29.6 pg (ref 26.0–34.0)
MCHC: 32.9 g/dL (ref 32.0–36.0)
MCV: 90 fL (ref 80.0–100.0)
MONO ABS: 0.6 10*3/uL (ref 0.2–0.9)
MONOS PCT: 8 %
NEUTROS PCT: 57 %
Neutro Abs: 4.5 10*3/uL (ref 1.4–6.5)
PLATELETS: 314 10*3/uL (ref 150–440)
RBC: 3.73 MIL/uL — ABNORMAL LOW (ref 3.80–5.20)
RDW: 15 % — AB (ref 11.5–14.5)
WBC: 7.7 10*3/uL (ref 3.6–11.0)

## 2016-05-13 LAB — TROPONIN I: Troponin I: <0.03 ng/mL (ref <0.03)

## 2016-05-13 LAB — BRAIN NATRIURETIC PEPTIDE: B Natriuretic Peptide: 32 pg/mL (ref 0.0–100.0)

## 2016-05-13 MED ORDER — ACETAMINOPHEN 500 MG PO TABS
1000.0000 mg | ORAL_TABLET | Freq: Once | ORAL | Status: AC
  Administered 2016-05-13: 1000 mg via ORAL
  Filled 2016-05-13: qty 2

## 2016-05-13 MED ORDER — GI COCKTAIL ~~LOC~~
30.0000 mL | Freq: Once | ORAL | Status: AC
  Administered 2016-05-13: 30 mL via ORAL
  Filled 2016-05-13: qty 30

## 2016-05-13 MED ORDER — PREDNISONE 20 MG PO TABS: 60.0000 mg | ORAL_TABLET | Freq: Every day | ORAL | 0 refills | Status: AC

## 2016-05-13 MED ORDER — KETOROLAC TROMETHAMINE 30 MG/ML IJ SOLN
15.0000 mg | Freq: Once | INTRAMUSCULAR | Status: AC
  Administered 2016-05-13: 15 mg via INTRAVENOUS
  Filled 2016-05-13: qty 1

## 2016-05-13 MED ORDER — IPRATROPIUM-ALBUTEROL 0.5-2.5 (3) MG/3ML IN SOLN
3.0000 mL | Freq: Once | RESPIRATORY_TRACT | Status: AC
  Administered 2016-05-13: 3 mL via RESPIRATORY_TRACT
  Filled 2016-05-13: qty 3

## 2016-05-13 MED ORDER — NAPROXEN 500 MG PO TABS: 500.0000 mg | ORAL_TABLET | Freq: Two times a day (BID) | ORAL | 0 refills | Status: DC

## 2016-05-13 MED ORDER — ALBUTEROL SULFATE HFA 108 (90 BASE) MCG/ACT IN AERS: 2.0000 | INHALATION_SPRAY | Freq: Four times a day (QID) | RESPIRATORY_TRACT | 2 refills | Status: DC | PRN

## 2016-05-13 MED ORDER — FUROSEMIDE 20 MG PO TABS
40.0000 mg | ORAL_TABLET | Freq: Every day | ORAL | 0 refills | Status: DC
Start: 2016-05-13 — End: 2020-03-01

## 2016-05-13 MED ORDER — PREDNISONE 20 MG PO TABS
60.0000 mg | ORAL_TABLET | Freq: Once | ORAL | Status: AC
  Administered 2016-05-13: 60 mg via ORAL
  Filled 2016-05-13: qty 3

## 2016-05-13 NOTE — ED Provider Notes (Signed)
[facility] Emergency Department Provider Note  ____________________________________________  Time seen: Approximately 12:58 PM  I have reviewed the triage vital signs and the nursing notes.   HISTORY  Chief Complaint Chest Pain   HPI Kadajah Kjos is a 46 y.o. female with a history of asthma, lower extremity edema, hypothyroidism, fibromyalgia who presents for evaluation of chest pain. Patient reports 2 days of sharp central chest pain and a dry cough. She has had chills but no fever. Patient was seen here8 days ago with the same exact presentation. She reports that her chest pain resolved once the cough resolved. At that time she had a CT of her chest as patient has had a prior history of DVT during pregnancy and that was negative for blood clot. Patient reports that her pain is severe, worse with coughing, located over her left breast and radiating to the center of her chest. She denies abdominal pain, nausea, vomiting, diarrhea. She has family history of ischemic heart disease. She is not a smoker. She had a stress test scheduled in 3 days with her primary care doctor. She is also complaining of worsening swelling of her bilateral lower extremity that has been going on for a few weeks. She hasn't seen her doctor for those yet. She takes Lasix 20 mg only.  Past Medical History:  Diagnosis Date  . Asthma   . Edema leg for twenty years per patient   taking lasix for edema  . Fibromyalgia   . Ovarian cyst   . Thyroid disease     Patient Active Problem List   Diagnosis Date Noted  . Obesity 01/17/2016  . History of opioid abuse 01/17/2016  . Asthma 01/17/2016  . Mixed hyperlipidemia 10/09/2015  . Hypokalemia 10/09/2015  . Headache above the eye region 06/16/2013  . Edema extremities 05/18/2013  . 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 GERD (gastroesophageal reflux disease) 04/11/2013  . Personality disorder 10/16/2012  . Chronic pain  syndrome 06/18/2012  . S/P ankle fusion 05/21/2012  . Schizoaffective disorder, bipolar type (HCC) 02/20/2012  . 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 Hypothyroidism 11/16/2002    Past Surgical History:  Procedure Laterality Date  . ANKLE SURGERY     pt. states long time ago    Prior to Admission medications   Medication Sig Start Date End Date Taking? Authorizing Provider  albuterol (PROVENTIL HFA;VENTOLIN HFA) 108 (90 Base) MCG/ACT inhaler Inhale 2 puffs into the lungs every 6 (six) hours as needed for wheezing or shortness of breath. 05/13/16   Nita Sickle, MD  ARIPiprazole (ABILIFY) 10 MG tablet Take 10 mg by mouth daily.    Historical Provider, MD  azithromycin (ZITHROMAX Z-PAK) 250 MG tablet Take 2 tablets (500 mg) on  Day 1,  followed by 1 tablet (250 mg) once daily on Days 2 through 5. 02/10/16   Tommi Rumps, PA-C  etodolac (LODINE) 200 MG capsule Take 1 capsule (200 mg total) by mouth every 8 (eight) hours. 05/06/16   Rebecka Apley, MD  furosemide (LASIX) 20 MG tablet Take 2 tablets (40 mg total) by mouth daily. 05/13/16   Nita Sickle, MD  haloperidol (HALDOL) 10 MG tablet Take 10 mg by mouth 4 (four) times daily.    Historical Provider, MD  ibuprofen (ADVIL,MOTRIN) 600 MG tablet Take 600 mg by mouth every 6 (six) hours as needed.    Historical Provider, MD  levothyroxine (SYNTHROID, LEVOTHROID) 100 MCG tablet Take 100 mcg by  mouth daily before breakfast.    Historical Provider, MD  naproxen (NAPROSYN) 500 MG tablet Take 1 tablet (500 mg total) by mouth 2 (two) times daily with a meal. 05/13/16 05/13/17  Nita Sickle, MD  oxyCODONE-acetaminophen (PERCOCET) 5-325 MG tablet Take 1 tablet by mouth every 4 (four) hours as needed for severe pain. 02/10/16   Tommi Rumps, PA-C  pantoprazole (PROTONIX) 20 MG tablet Take 40 mg by mouth daily.    Historical Provider, MD  predniSONE (DELTASONE) 20 MG tablet Take 3 tablets (60 mg total) by mouth daily.  05/13/16 05/17/16  Nita Sickle, MD  topiramate (TOPAMAX) 100 MG tablet Take 100 mg by mouth 2 (two) times daily.    Historical Provider, MD  venlafaxine (EFFEXOR) 100 MG tablet Take 100 mg by mouth 2 (two) times daily.    Historical Provider, MD    Allergies Codeine; Sulfa antibiotics; Penicillins; and Aspirin  Family History  Problem Relation Age of Onset  . Heart failure Mother     Social History Social History  Substance Use Topics  . Smoking status: Never Smoker  . Smokeless tobacco: Never Used  . Alcohol use No    Review of Systems  Constitutional: Negative for fever. Eyes: Negative for visual changes. ENT: Negative for sore throat. Neck: No neck pain  Cardiovascular: + chest pain. Respiratory: Negative for shortness of breath. Gastrointestinal: Negative for abdominal pain, vomiting or diarrhea. Genitourinary: Negative for dysuria. Musculoskeletal: Negative for back pain. + b/l LE edema Skin: Negative for rash. Neurological: Negative for headaches, weakness or numbness. Psych: No SI or HI  ____________________________________________   PHYSICAL EXAM:  VITAL SIGNS: ED Triage Vitals  Enc Vitals Group     BP 05/13/16 1136 134/76     Pulse Rate 05/13/16 1136 83     Resp 05/13/16 1136 16     Temp 05/13/16 1136 98.2 F (36.8 C)     Temp Source 05/13/16 1136 Oral     SpO2 05/13/16 1136 96 %     Weight 05/13/16 1137 197 lb (89.4 kg)     Height 05/13/16 1137  (1.549 m)     Head Circumference --      Peak Flow --      Pain Score 05/13/16 1140 8     Pain Loc --      Pain Edu? --      Excl. in GC? --     Constitutional: Alert and oriented. Well appearing and in no apparent distress. HEENT:      Head: Normocephalic and atraumatic.         Eyes: Conjunctivae are normal. Sclera is non-icteric. EOMI. PERRL      Mouth/Throat: Mucous membranes are moist.       Neck: Supple with no signs of meningismus. Cardiovascular: Regular rate and rhythm. No murmurs,  gallops, or rubs. 2+ symmetrical distal pulses are present in all extremities. No JVD. ttp over the central chest wall Respiratory: Normal respiratory effort. Lungs are clear to auscultation bilaterally with diminished air movement. No wheezes, crackles, or rhonchi.  Gastrointestinal: Soft, non tender, and non distended with positive bowel sounds. No rebound or guarding. Musculoskeletal: Non pitting edema of b/l LE with no warmth or erythema. Neurologic: Normal speech and language. Face is symmetric. Moving all extremities. No gross focal neurologic deficits are appreciated. Skin: Skin is warm, dry and intact. No rash noted. Psychiatric: Mood and affect are normal. Speech and behavior are normal.  ____________________________________________   LABS (all labs ordered  are listed, but only abnormal results are displayed)  Labs Reviewed  BASIC METABOLIC PANEL - Abnormal; Notable for the following:       Result Value   Sodium 134 (*)    Potassium 3.4 (*)    Chloride 98 (*)    Calcium 8.8 (*)    All other components within normal limits  CBC WITH DIFFERENTIAL/PLATELET - Abnormal; Notable for the following:    RBC 3.73 (*)    Hemoglobin 11.0 (*)    HCT 33.6 (*)    RDW 15.0 (*)    Eosinophils Absolute 1.1 (*)    All other components within normal limits  TROPONIN I  BRAIN NATRIURETIC PEPTIDE  TROPONIN I   ____________________________________________  EKG  ED ECG REPORT I, Nita Sickle, the attending physician, personally viewed and interpreted this ECG.  Normal sinus rhythm, rate of 87, normal intervals, normal axis, no ST elevations or depressions, diffuse T-wave flattening. No significant changes from prior ____________________________________________  RADIOLOGY  CXR: negative  ____________________________________________   PROCEDURES  Procedure(s) performed: None Procedures Critical Care performed:  None ____________________________________________   INITIAL  IMPRESSION / ASSESSMENT AND PLAN / ED COURSE  46 y.o. female with a history of asthma, lower extremity edema, hypothyroidism, fibromyalgia who presents for evaluation of chest pain which is reproducible on exam in the setting of moderate cough. Patient also has decreased air movement bilaterally which makes me concerned this could potentially be an asthma exacerbation the setting of a viral URI. She has no fever, normal white count, chest x-ray with no evidence of pneumonia. We'll give one DuoNeb treatment and IV Toradol and reassess. Patient's troponin is negative. EKG with no ischemic changes. Patient has a stress test arranged by her PCP in 3 days will make sure that she follows up with that. She is also complaining of swelling on her lower extremities which is been getting worse over the course of the last month. She has symmetric nonpitting lower extremity edema. She has no history of heart failure, she has no crackles on exam, chest x-ray with no pulmonary edema. She does have known history of lower extremity edema. BNP is normal. She is only on 20 mg of Lasix daily. We'll have patient increase the dose to  daily and follow up with PCP for recheck on Friday.  Clinical Course as of May 14 1638  Tue May 13, 2016  1636 Patient reports improvement of her symptoms after receiving 1 DuoNeb treatment. She was given steroids here as well. Her presentation is concerning for an asthma exacerbation in the setting of a viral URI. No evidence of pneumonia. Troponin 2 is negative. EKG with no ischemic changes. Patient has an appointment with her primary care doctor in 3 days and also has a stress test scheduled for the same day. I recommended that she follows up with her doctor and make sure to undergo these tests. Recommended she return to the emergency room if she having shortness of breath, fever, or any new symptoms.  [CV]    Clinical Course User Index [CV] Nita Sickle, MD    Pertinent labs &  imaging results that were available during my care of the patient were reviewed by me and considered in my medical decision making (see chart for details).    ____________________________________________   FINAL CLINICAL IMPRESSION(S) / ED DIAGNOSES  Final diagnoses:  Mild intermittent asthma with exacerbation      NEW MEDICATIONS STARTED DURING THIS VISIT:  New Prescriptions   ALBUTEROL (PROVENTIL  HFA;VENTOLIN HFA) 108 (90 BASE) MCG/ACT INHALER    Inhale 2 puffs into the lungs every 6 (six) hours as needed for wheezing or shortness of breath.   NAPROXEN (NAPROSYN) 500 MG TABLET    Take 1 tablet (500 mg total) by mouth 2 (two) times daily with a meal.   PREDNISONE (DELTASONE) 20 MG TABLET    Take 3 tablets (60 mg total) by mouth daily.     Note:  This document was prepared using Dragon voice recognition software and may include unintentional dictation errors.    Nita Sickle, MD 05/13/16 [id number]

## 2016-05-13 NOTE — ED Triage Notes (Signed)
Pt to ed with c/o chest pain this am.  Pt states acute onset this am in left breast with sob. Seen here on 4/9 for same.

## 2016-05-13 NOTE — ED Notes (Signed)
Patient was found off the stretcher, both rails were up. Patient detached herself from her leads, b/p cuff and pulse ox. Patient bent over to clean up drops of blood from the floor, stating, "I didn't want anyone to slip on the blood from where I scratched myself." Patient has several open sores on arms.

## 2016-05-13 NOTE — ED Notes (Signed)
Hooked pt back up to monitor 

## 2016-05-13 NOTE — ED Notes (Signed)
Patient states duoneb treatment has improved her breathing, but chest pain remains 8/10. MD aware.

## 2016-05-13 NOTE — ED Notes (Signed)
Patient continues to c/o pain. Dr. Don Perking aware.

## 2016-06-26 ENCOUNTER — Ambulatory Visit: Payer: Medicaid Other | Attending: Pain Medicine | Admitting: Pain Medicine

## 2016-06-26 DIAGNOSIS — F1190 Opioid use, unspecified, uncomplicated: Secondary | ICD-10-CM | POA: Insufficient documentation

## 2016-06-26 DIAGNOSIS — Z79891 Long term (current) use of opiate analgesic: Secondary | ICD-10-CM | POA: Insufficient documentation

## 2016-06-26 NOTE — Progress Notes (Deleted)
Cancelled appointment

## 2016-08-04 ENCOUNTER — Ambulatory Visit
Admission: EM | Admit: 2016-08-04 | Discharge: 2016-08-04 | Disposition: A | Discharge status: Home / Self Care | Payer: Medicaid Other | Attending: Family Medicine | Admitting: Family Medicine

## 2016-08-04 ENCOUNTER — Ambulatory Visit (INDEPENDENT_AMBULATORY_CARE_PROVIDER_SITE_OTHER): Payer: Medicaid Other

## 2016-08-04 DIAGNOSIS — W19XXXA Unspecified fall, initial encounter: Secondary | ICD-10-CM | POA: Diagnosis not present

## 2016-08-04 DIAGNOSIS — S46912A Strain of unspecified muscle, fascia and tendon at shoulder and upper arm level, left arm, initial encounter: Secondary | ICD-10-CM

## 2016-08-04 DIAGNOSIS — S40012A Contusion of left shoulder, initial encounter: Secondary | ICD-10-CM

## 2016-08-04 MED ORDER — TRAMADOL HCL 50 MG PO TABS: ORAL_TABLET | ORAL | 0 refills | Status: DC

## 2016-08-04 NOTE — ED Provider Notes (Signed)
[facility]    CSN: [id number] Arrival date & time: 08/04/16  1443     History   Chief Complaint Chief Complaint  Patient presents with  . Shoulder Injury    HPI Valary Manahan is a 46 y.o. female.   47 yo female with a c/o left shoulder and collarbone pain for the past 5 days, since falling and landing on her left shoulder. Denies any numbness/tingling or swelling.    The history is provided by the patient.  Shoulder Injury     Past Medical History:  Diagnosis Date  . Asthma   . Edema leg for twenty years per patient   taking lasix for edema  . Fibromyalgia   . Ovarian cyst   . Thyroid disease     Patient Active Problem List   Diagnosis Date Noted  . Long term (current) use of opiate analgesic 06/26/2016  . Long term prescription opiate use 06/26/2016  . Opiate use 06/26/2016  . Obesity 01/17/2016  . History of opioid abuse 01/17/2016  . Asthma 01/17/2016  . Mixed hyperlipidemia 10/09/2015  . Hypokalemia 10/09/2015  . Headache above the eye region 06/16/2013  . Edema extremities 05/18/2013  . 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 GERD (gastroesophageal reflux disease) 04/11/2013  . Personality disorder 10/16/2012  . Chronic pain syndrome 06/18/2012  . S/P ankle fusion 05/21/2012  . Schizoaffective disorder, bipolar type (HCC) 02/20/2012  . 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 Hypothyroidism 11/16/2002    Past Surgical History:  Procedure Laterality Date  . ANKLE SURGERY     pt. states long time ago    OB History    Gravida Para Term Preterm AB Living   3 2 2     2    SAB TAB Ectopic Multiple Live Births                   Home Medications    Prior to Admission medications   Medication Sig Start Date End Date Taking? Authorizing Provider  albuterol (PROVENTIL HFA;VENTOLIN HFA) 108 (90 Base) MCG/ACT inhaler Inhale 2 puffs  into the lungs every 6 (six) hours as needed for wheezing or shortness of breath. 05/13/16   Nita Sickle, MD  ARIPiprazole (ABILIFY) 10 MG tablet Take 10 mg by mouth daily.    [provider]  azithromycin (ZITHROMAX Z-PAK) 250 MG tablet Take 2 tablets (500 mg) on  Day 1,  followed by 1 tablet (250 mg) once daily on Days 2 through 5. 02/10/16   Tommi Rumps, PA-C  etodolac (LODINE) 200 MG capsule Take 1 capsule (200 mg total) by mouth every 8 (eight) hours. 05/06/16   Rebecka Apley, MD  furosemide (LASIX) 20 MG tablet Take 2 tablets (40 mg total) by mouth daily. 05/13/16   Nita Sickle, MD  haloperidol (HALDOL) 10 MG tablet Take 10 mg by mouth 4 (four) times daily.    [provider]  ibuprofen (ADVIL,MOTRIN) 600 MG tablet Take 600 mg by mouth every 6 (six) hours as needed.    [provider]  levothyroxine (SYNTHROID, LEVOTHROID) 100 MCG tablet Take 100 mcg by mouth daily before breakfast.    [provider]  naproxen (NAPROSYN) 500 MG tablet Take 1 tablet (500 mg total) by mouth 2 (two) times daily with a meal. 05/13/16 05/13/17  Don Perking, [place], MD  oxyCODONE-acetaminophen (PERCOCET) 5-325 MG tablet Take 1 tablet by mouth  every 4 (four) hours as needed for severe pain. 02/10/16   Tommi RumpsSummers, Rhonda L, PA-C  pantoprazole (PROTONIX) 20 MG tablet Take 40 mg by mouth daily.    [provider]  topiramate (TOPAMAX) 100 MG tablet Take 100 mg by mouth 2 (two) times daily.    [provider]  traMADol (ULTRAM) 50 MG tablet 1-2 tabs po q 8 hours prn 08/04/16   Payton Mccallumonty, Dierks Wach, MD  venlafaxine (EFFEXOR) 100 MG tablet Take 100 mg by mouth 2 (two) times daily.    [provider]    Family History Family History  Problem Relation Age of Onset  . Heart failure Mother     Social History Social History  Substance Use Topics  . Smoking status: Never Smoker  . Smokeless tobacco: Never Used  . Alcohol use No     Allergies     Codeine; Sulfa antibiotics; Penicillins; and Aspirin   Review of Systems Review of Systems   Physical Exam Triage Vital Signs ED Triage Vitals  Enc Vitals Group     BP 08/04/16 1500 140/90     Pulse Rate 08/04/16 1500 90     Resp --      Temp 08/04/16 1500 98.6 F (37 C)     Temp Source 08/04/16 1500 Oral     SpO2 08/04/16 1500 100 %     Weight 08/04/16 1503 220 lb (99.8 kg)     Height 08/04/16 1503 5\' 4"  (1.626 m)     Head Circumference --      Peak Flow --      Pain Score 08/04/16 1503 7     Pain Loc --      Pain Edu? --      Excl. in GC? --    No data found.   Updated Vital Signs BP 140/90 (BP Location: Right Arm)   Pulse 90   Temp 98.6 F (37 C) (Oral)   Ht 5\' 4"  (1.626 m)   Wt 220 lb (99.8 kg)   SpO2 100%   BMI 37.76 kg/m   Visual Acuity Right Eye Distance:   Left Eye Distance:   Bilateral Distance:    Right Eye Near:   Left Eye Near:    Bilateral Near:     Physical Exam  Constitutional: She appears well-developed and well-nourished. No distress.  HENT:  Head: Normocephalic and atraumatic.  Musculoskeletal:       Left shoulder: She exhibits decreased range of motion and bony tenderness (over the mid and distal clavicle bone). She exhibits no swelling, no effusion, no crepitus, no deformity, no laceration, no spasm, normal pulse and normal strength.  Extremity neurovascularly intact  Skin: She is not diaphoretic.  Nursing note and vitals reviewed.    UC Treatments / Results  Labs (all labs ordered are listed, but only abnormal results are displayed) Labs Reviewed - No data to display  EKG  EKG Interpretation None       Radiology Dg Clavicle Left  Result Date: 08/04/2016 CLINICAL DATA:  Fall with left clavicle pain.  Initial encounter. EXAM: LEFT CLAVICLE - 2+ VIEWS COMPARISON:  None. FINDINGS: There is no evidence of fracture or other focal bone lesions. Soft tissues are unremarkable. IMPRESSION: Negative left clavicle series.  Electronically Signed   By: Marnee SpringJonathon  Watts M.D.   On: 08/04/2016 15:54   Dg Shoulder Left  Result Date: 08/04/2016 CLINICAL DATA:  Fall 4 days ago with left shoulder pain. EXAM: LEFT SHOULDER - 2+ VIEW COMPARISON:  None. FINDINGS: There is no evidence of fracture or dislocation. There is no evidence of arthropathy or other focal bone abnormality. Soft tissues are unremarkable. IMPRESSION: Negative left shoulder series. Electronically Signed   By: Marnee Spring M.D.   On: 08/04/2016 15:54    Procedures Procedures (including critical care time)  Medications Ordered in UC Medications - No data to display   Initial Impression / Assessment and Plan / UC Course  I have reviewed the triage vital signs and the nursing notes.  Pertinent labs & imaging results that were available during my care of the patient were reviewed by me and considered in my medical decision making (see chart for details).       Final Clinical Impressions(s) / UC Diagnoses   Final diagnoses:  Contusion of left shoulder, initial encounter  Strain of acromioclavicular joint, left, initial encounter    New Prescriptions New Prescriptions   TRAMADOL (ULTRAM) 50 MG TABLET    1-2 tabs po q 8 hours prn   1. x-ray results and diagnosis reviewed with patient 2. rx as per orders above; reviewed possible side effects, interactions, risks and benefits  3. Recommend supportive treatment with sling for rest this week, then range of motion exercises 4. Follow-up prn if symptoms worsen or don't improve   Payton Mccallum, MD 08/04/16 [phone]

## 2016-08-04 NOTE — ED Triage Notes (Signed)
Patient stated She fell on left collarbone when falling on the ground Thursday.

## 2016-08-13 DIAGNOSIS — E119 Type 2 diabetes mellitus without complications: Secondary | ICD-10-CM

## 2016-09-05 DIAGNOSIS — E669 Obesity, unspecified: Secondary | ICD-10-CM | POA: Insufficient documentation

## 2016-09-28 ENCOUNTER — Emergency Department: Payer: Medicaid Other

## 2016-09-28 ENCOUNTER — Emergency Department
Admission: EM | Admit: 2016-09-28 | Discharge: 2016-09-28 | Disposition: A | Discharge status: Home / Self Care | Payer: Medicaid Other | Attending: Emergency Medicine | Admitting: Emergency Medicine

## 2016-09-28 ENCOUNTER — Encounter: Payer: Self-pay | Admitting: Emergency Medicine

## 2016-09-28 DIAGNOSIS — S29019A Strain of muscle and tendon of unspecified wall of thorax, initial encounter: Secondary | ICD-10-CM

## 2016-09-28 DIAGNOSIS — Y999 Unspecified external cause status: Secondary | ICD-10-CM | POA: Insufficient documentation

## 2016-09-28 DIAGNOSIS — Y939 Activity, unspecified: Secondary | ICD-10-CM | POA: Diagnosis not present

## 2016-09-28 DIAGNOSIS — X58XXXA Exposure to other specified factors, initial encounter: Secondary | ICD-10-CM | POA: Diagnosis not present

## 2016-09-28 DIAGNOSIS — Z79899 Other long term (current) drug therapy: Secondary | ICD-10-CM | POA: Diagnosis not present

## 2016-09-28 DIAGNOSIS — J45909 Unspecified asthma, uncomplicated: Secondary | ICD-10-CM | POA: Diagnosis not present

## 2016-09-28 DIAGNOSIS — Y929 Unspecified place or not applicable: Secondary | ICD-10-CM | POA: Diagnosis not present

## 2016-09-28 DIAGNOSIS — S299XXA Unspecified injury of thorax, initial encounter: Secondary | ICD-10-CM | POA: Diagnosis present

## 2016-09-28 DIAGNOSIS — E039 Hypothyroidism, unspecified: Secondary | ICD-10-CM | POA: Diagnosis not present

## 2016-09-28 MED ORDER — CYCLOBENZAPRINE HCL 10 MG PO TABS: 10.0000 mg | ORAL_TABLET | Freq: Three times a day (TID) | ORAL | 0 refills | Status: DC | PRN

## 2016-09-28 NOTE — Discharge Instructions (Signed)
Follow-up with Rhonda Skeensodd Mundy, PA. Continue the medications that they were taking for your shoulder pain. Return to the emergency department for symptoms that change or worsen if you are unable to schedule an appointment.

## 2016-09-28 NOTE — ED Notes (Signed)
Patient transported to X-ray 

## 2016-09-28 NOTE — ED Provider Notes (Signed)
[facility] Emergency Department Provider Note ____________________________________________  Time seen: Approximately 9:51 AM  I have reviewed the triage vital signs and the nursing notes.   HISTORY  Chief Complaint Back Pain    HPI Rhonda Palmer is a 46 y.o. female who presents to the emergency department for evaluation of thoracic back pain. Pain started 4 days ago. No specific injury. She states pain hurts worse when she attempts to lift anything or turns certain ways. No specific injury that she can recall. She is under treatment with [facility] Monday for shoulder pain after a fall 6 weeks ago, but states that the shoulder has greatly improved and she did not have any pain in her back after the fall. She is currently taking meloxicam, tramadol, and gabapentin which have not helped.  Past Medical History:  Diagnosis Date  . Asthma   . Edema leg for twenty years per patient   taking lasix for edema  . Fibromyalgia   . Ovarian cyst   . Thyroid disease     Patient Active Problem List   Diagnosis Date Noted  . Long term (current) use of opiate analgesic 06/26/2016  . Long term prescription opiate use 06/26/2016  . Opiate use 06/26/2016  . Obesity 01/17/2016  . History of opioid abuse 01/17/2016  . Asthma 01/17/2016  . Mixed hyperlipidemia 10/09/2015  . Hypokalemia 10/09/2015  . Headache above the eye region 06/16/2013  . Edema extremities 05/18/2013  . 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 GERD (gastroesophageal reflux disease) 04/11/2013  . Personality disorder 10/16/2012  . Chronic pain syndrome 06/18/2012  . S/P ankle fusion 05/21/2012  . Schizoaffective disorder, bipolar type (HCC) 02/20/2012  . 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 Hypothyroidism 11/16/2002    Past Surgical History:  Procedure Laterality Date  . ANKLE SURGERY     pt.  states long time ago    Prior to Admission medications   Medication Sig Start Date End Date Taking? Authorizing Provider  albuterol (PROVENTIL HFA;VENTOLIN HFA) 108 (90 Base) MCG/ACT inhaler Inhale 2 puffs into the lungs every 6 (six) hours as needed for wheezing or shortness of breath. 05/13/16   Nita Sickle, MD  ARIPiprazole (ABILIFY) 10 MG tablet Take 10 mg by mouth daily.    [provider]  azithromycin (ZITHROMAX Z-PAK) 250 MG tablet Take 2 tablets (500 mg) on  Day 1,  followed by 1 tablet (250 mg) once daily on Days 2 through 5. 02/10/16   Tommi Rumps, PA-C  cyclobenzaprine (FLEXERIL) 10 MG tablet Take 1 tablet (10 mg total) by mouth 3 (three) times daily as needed for muscle spasms. 09/28/16   Colleene Swarthout B, FNP  etodolac (LODINE) 200 MG capsule Take 1 capsule (200 mg total) by mouth every 8 (eight) hours. 05/06/16   Rebecka Apley, MD  furosemide (LASIX) 20 MG tablet Take 2 tablets (40 mg total) by mouth daily. 05/13/16   Nita Sickle, MD  haloperidol (HALDOL) 10 MG tablet Take 10 mg by mouth 4 (four) times daily.    [provider]  ibuprofen (ADVIL,MOTRIN) 600 MG tablet Take 600 mg by mouth every 6 (six) hours as needed.    [provider]  levothyroxine (SYNTHROID, LEVOTHROID) 100 MCG tablet Take 100 mcg by mouth daily before breakfast.    [provider]  naproxen (NAPROSYN) 500 MG tablet Take 1 tablet (500 mg total) by mouth 2 (  two) times daily with a meal. 05/13/16 05/13/17  Don PerkingVeronese, [place], MD  oxyCODONE-acetaminophen (PERCOCET) 5-325 MG tablet Take 1 tablet by mouth every 4 (four) hours as needed for severe pain. 02/10/16   Tommi RumpsSummers, Rhonda L, PA-C  pantoprazole (PROTONIX) 20 MG tablet Take 40 mg by mouth daily.    [provider]  topiramate (TOPAMAX) 100 MG tablet Take 100 mg by mouth 2 (two) times daily.    [provider]  traMADol (ULTRAM) 50 MG tablet 1-2 tabs po q 8 hours prn 08/04/16   Payton Mccallumonty, [place], MD   venlafaxine (EFFEXOR) 100 MG tablet Take 100 mg by mouth 2 (two) times daily.    [provider]    Allergies Codeine; Sulfa antibiotics; Penicillins; and Aspirin  Family History  Problem Relation Age of Onset  . Heart failure Mother     Social History Social History  Substance Use Topics  . Smoking status: Never Smoker  . Smokeless tobacco: Never Used  . Alcohol use No    Review of Systems Constitutional: Negative for fever or recent illness. Cardiovascular: Negative for chest pain Respiratory: Negative for shortness of breath. Musculoskeletal: Positive for thoracic pain. Skin: Positive for diffuse lesions reported to be flea bites.  Neurological: Negative for cognitive changes.  ____________________________________________   PHYSICAL EXAM:  VITAL SIGNS: ED Triage Vitals  Enc Vitals Group     BP 09/28/16 0901 (!) 134/97     Pulse Rate 09/28/16 0901 92     Resp 09/28/16 0901 16     Temp 09/28/16 0901 98.1 F (36.7 C)     Temp Source 09/28/16 0901 Oral     SpO2 09/28/16 0901 100 %     Weight 09/28/16 0900 250 lb (113.4 kg)     Height 09/28/16 0900 5\' 2"  (1.575 m)     Head Circumference --      Peak Flow --      Pain Score 09/28/16 0859 7     Pain Loc --      Pain Edu? --      Excl. in GC? --     Constitutional: Alert and oriented. Well appearing and in no acute distress. Eyes: Conjunctivae clear without discharge or drainage.  Head: Atraumatic. Neck: Active ROM observed. Respiratory: Respirations even and unlabored. Musculoskeletal: Midline tenderness of the upper thoracic spine.  Neurologic: Awake, alert, and oriented. No paresthesias.  Skin: Diffuse lesions consistent with reported flea bites over arms and posterior neck.  Psychiatric: Affect and behavior are normal.  ____________________________________________   LABS (all labs ordered are listed, but only abnormal results are displayed)  Labs Reviewed - No data to  display ____________________________________________  RADIOLOGY  Thoracic image is negative for acute abnormality per radiology. ____________________________________________   PROCEDURES  Procedure(s) performed: None  ____________________________________________   INITIAL IMPRESSION / ASSESSMENT AND PLAN / ED COURSE  Rhonda Palmer is a 46 y.o. female who presents to the emergency department for evaluation and treatment of non-traumatic thoracic back pain. She will be given a prescription for flexeril and continue to take her medications prescribed by orthopedics. She is to return to the emergency department for symptoms that change or worsen if unable to schedule an appointment.  Pertinent labs & imaging results that were available during my care of the patient were reviewed by me and considered in my medical decision making (see chart for details).  _________________________________________   FINAL CLINICAL IMPRESSION(S) / ED DIAGNOSES  Final diagnoses:  Thoracic myofascial strain, initial encounter  Discharge Medication List as of 09/28/2016 10:54 AM    START taking these medications   Details  cyclobenzaprine (FLEXERIL) 10 MG tablet Take 1 tablet (10 mg total) by mouth 3 (three) times daily as needed for muscle spasms., Starting Sun 09/28/2016, Print        If controlled substance prescribed during this visit, 12 month history viewed on the [facility] prior to issuing an initial prescription for Schedule II or III opiod.    Chinita Pester, FNP 09/28/16 1128    Phineas Semen, MD 09/28/16 1248

## 2016-09-28 NOTE — ED Notes (Signed)
Pt. Has high thoracic spine pain. Denies tingling and numbness at this time.  Shoulder injury from fall 6 weeks ago. Denies injury at that time. Has sore spot over upper back. Pt states they are from scratching from flea bites.

## 2016-09-28 NOTE — ED Triage Notes (Signed)
C/O upper back pain x 4 days.  Denies injury.

## 2016-10-03 ENCOUNTER — Encounter: Payer: Self-pay | Admitting: Emergency Medicine

## 2016-10-03 DIAGNOSIS — J45909 Unspecified asthma, uncomplicated: Secondary | ICD-10-CM | POA: Diagnosis not present

## 2016-10-03 DIAGNOSIS — Z79899 Other long term (current) drug therapy: Secondary | ICD-10-CM | POA: Diagnosis not present

## 2016-10-03 DIAGNOSIS — S29019A Strain of muscle and tendon of unspecified wall of thorax, initial encounter: Secondary | ICD-10-CM | POA: Insufficient documentation

## 2016-10-03 DIAGNOSIS — S299XXA Unspecified injury of thorax, initial encounter: Secondary | ICD-10-CM | POA: Diagnosis present

## 2016-10-03 DIAGNOSIS — E039 Hypothyroidism, unspecified: Secondary | ICD-10-CM | POA: Insufficient documentation

## 2016-10-03 DIAGNOSIS — X58XXXA Exposure to other specified factors, initial encounter: Secondary | ICD-10-CM | POA: Diagnosis not present

## 2016-10-03 DIAGNOSIS — G894 Chronic pain syndrome: Secondary | ICD-10-CM | POA: Insufficient documentation

## 2016-10-03 DIAGNOSIS — Y999 Unspecified external cause status: Secondary | ICD-10-CM | POA: Diagnosis not present

## 2016-10-03 DIAGNOSIS — Y929 Unspecified place or not applicable: Secondary | ICD-10-CM | POA: Insufficient documentation

## 2016-10-03 DIAGNOSIS — Y939 Activity, unspecified: Secondary | ICD-10-CM | POA: Diagnosis not present

## 2016-10-03 NOTE — ED Notes (Signed)
Patient to stat desk requesting pain medications. Patient offered IBU at this time but patient declined.

## 2016-10-03 NOTE — ED Triage Notes (Signed)
Pt ambulatory to triage in NAD, reports ongoing upper back pain that has worsened over past week, pt reports being seen by PCP, given flexeril, tramadol, meloxicam, and gabapentin w/o relief.  Pt reports hx of injury to left shoulder and neck from fall 9 weeks ago.

## 2016-10-04 ENCOUNTER — Emergency Department
Admission: EM | Admit: 2016-10-04 | Discharge: 2016-10-04 | Disposition: A | Discharge status: Home / Self Care | Payer: Medicaid Other | Attending: Emergency Medicine | Admitting: Emergency Medicine

## 2016-10-04 DIAGNOSIS — S29019A Strain of muscle and tendon of unspecified wall of thorax, initial encounter: Secondary | ICD-10-CM

## 2016-10-04 MED ORDER — LIDOCAINE 5 % EX PTCH
MEDICATED_PATCH | CUTANEOUS | Status: AC
  Filled 2016-10-04: qty 1

## 2016-10-04 MED ORDER — OXYCODONE-ACETAMINOPHEN 5-325 MG PO TABS
ORAL_TABLET | ORAL | Status: DC
Start: 2016-10-04 — End: 2016-10-04
  Filled 2016-10-04: qty 1

## 2016-10-04 MED ORDER — OXYCODONE-ACETAMINOPHEN 5-325 MG PO TABS
1.0000 | ORAL_TABLET | Freq: Once | ORAL | Status: AC
  Administered 2016-10-04: 1 via ORAL

## 2016-10-04 MED ORDER — LIDOCAINE 5 % EX PTCH
1.0000 | MEDICATED_PATCH | CUTANEOUS | Status: DC
  Administered 2016-10-04: 1 via TRANSDERMAL

## 2016-10-04 MED ORDER — OXYCODONE-ACETAMINOPHEN 5-325 MG PO TABS: 1.0000 | ORAL_TABLET | ORAL | 0 refills | Status: DC | PRN

## 2016-10-04 MED ORDER — LIDOCAINE 5 % EX PTCH: 1.0000 | MEDICATED_PATCH | Freq: Every day | CUTANEOUS | 0 refills | Status: AC | PRN

## 2016-10-04 NOTE — ED Notes (Signed)
Patient informed MD Manson PasseyBrown in front of this RN that she did not drive to this ED today, and that she would be able to obtain a ride home if given pain medication

## 2016-10-04 NOTE — ED Provider Notes (Signed)
[facility] Emergency Department Provider Note    First MD Initiated Contact with Patient 10/04/16 0319     (approximate)  I have reviewed the triage vital signs and the nursing notes.   HISTORY  Chief Complaint Back Pain    HPI Rhonda Palmer is a 46 y.o. female with below list of chronic medical conditions presents to the emergency department with continued midline thoracic back pain with onset 09/28/2016. Patient states that pain is worse with lifting of the arms or turning from side to side. Patient states that she is [facility]opedists for shoulder pain and believes that whenever she does the exercises for set injury she has worsening pain in her midline of her back. Patient states her current pain score is 9 out of 10. Patient denies any dyspnea no chest pain.   Past Medical History:  Diagnosis Date  . Asthma   . Edema leg for twenty years per patient   taking lasix for edema  . Fibromyalgia   . Ovarian cyst   . Thyroid disease     Patient Active Problem List   Diagnosis Date Noted  . Long term (current) use of opiate analgesic 06/26/2016  . Long term prescription opiate use 06/26/2016  . Opiate use 06/26/2016  . Obesity 01/17/2016  . History of opioid abuse 01/17/2016  . Asthma 01/17/2016  . Mixed hyperlipidemia 10/09/2015  . Hypokalemia 10/09/2015  . Headache above the eye region 06/16/2013  . Edema extremities 05/18/2013  . 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 GERD (gastroesophageal reflux disease) 04/11/2013  . Personality disorder 10/16/2012  . Chronic pain syndrome 06/18/2012  . S/P ankle fusion 05/21/2012  . Schizoaffective disorder, bipolar type (HCC) 02/20/2012  . 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 Hypothyroidism 11/16/2002    Past Surgical History:  Procedure  Laterality Date  . ANKLE SURGERY     pt. states long time ago    Prior to Admission medications   Medication Sig Start Date End Date Taking? Authorizing Provider  albuterol (PROVENTIL HFA;VENTOLIN HFA) 108 (90 Base) MCG/ACT inhaler Inhale 2 puffs into the lungs every 6 (six) hours as needed for wheezing or shortness of breath. 05/13/16   Nita SickleVeronese, [place], MD  ARIPiprazole (ABILIFY) 10 MG tablet Take 10 mg by mouth daily.    [provider]  azithromycin (ZITHROMAX Z-PAK) 250 MG tablet Take 2 tablets (500 mg) on  Day 1,  followed by 1 tablet (250 mg) once daily on Days 2 through 5. 02/10/16   Tommi RumpsSummers, Rhonda L, PA-C  cyclobenzaprine (FLEXERIL) 10 MG tablet Take 1 tablet (10 mg total) by mouth 3 (three) times daily as needed for muscle spasms. 09/28/16   Triplett, Cari B, FNP  etodolac (LODINE) 200 MG capsule Take 1 capsule (200 mg total) by mouth every 8 (eight) hours. 05/06/16   Rebecka ApleyWebster, Allison P, MD  furosemide (LASIX) 20 MG tablet Take 2 tablets (40 mg total) by mouth daily. 05/13/16   Nita SickleVeronese, [place], MD  haloperidol (HALDOL) 10 MG tablet Take 10 mg by mouth 4 (four) times daily.    [provider]  ibuprofen (ADVIL,MOTRIN) 600 MG tablet Take 600 mg by mouth every 6 (six) hours as needed.    [provider]  levothyroxine (SYNTHROID, LEVOTHROID) 100 MCG tablet Take 100 mcg by mouth daily before breakfast.    [provider]  naproxen (  NAPROSYN) 500 MG tablet Take 1 tablet (500 mg total) by mouth 2 (two) times daily with a meal. 05/13/16 05/13/17  Don Perking, [place], MD  oxyCODONE-acetaminophen (PERCOCET) 5-325 MG tablet Take 1 tablet by mouth every 4 (four) hours as needed for severe pain. 02/10/16   Tommi Rumps, PA-C  pantoprazole (PROTONIX) 20 MG tablet Take 40 mg by mouth daily.    [provider]  topiramate (TOPAMAX) 100 MG tablet Take 100 mg by mouth 2 (two) times daily.    [provider]  traMADol (ULTRAM) 50 MG tablet 1-2 tabs  po q 8 hours prn 08/04/16   Payton Mccallum, MD  venlafaxine (EFFEXOR) 100 MG tablet Take 100 mg by mouth 2 (two) times daily.    [provider]    Allergies Codeine; Sulfa antibiotics; Penicillins; and Aspirin  Family History  Problem Relation Age of Onset  . Heart failure Mother     Social History Social History  Substance Use Topics  . Smoking status: Never Smoker  . Smokeless tobacco: Never Used  . Alcohol use No    Review of Systems Constitutional: No fever/chills Eyes: No visual changes. ENT: No sore throat. Cardiovascular: Denies chest pain. Respiratory: Denies shortness of breath. Gastrointestinal: No abdominal pain.  No nausea, no vomiting.  No diarrhea.  No constipation. Genitourinary: Negative for dysuria. Musculoskeletal: Negative for neck pain.  Positive for thoracic back pain. Integumentary: Negative for rash. Neurological: Negative for headaches, focal weakness or numbness.   ____________________________________________   PHYSICAL EXAM:  VITAL SIGNS: ED Triage Vitals  Enc Vitals Group     BP 10/03/16 2331 (!) 140/104     Pulse Rate 10/03/16 2331 (!) 113     Resp 10/03/16 2331 18     Temp 10/03/16 2331 98 F (36.7 C)     Temp Source 10/03/16 2331 Oral     SpO2 10/03/16 2331 98 %     Weight 10/03/16 2331 113.4 kg (250 lb)     Height 10/03/16 2331 1.549 m ( )     Head Circumference --      Peak Flow --      Pain Score 10/03/16 2330 9     Pain Loc --      Pain Edu? --      Excl. in GC? --     Constitutional: Alert and oriented. Well appearing and in no acute distress. Eyes: Conjunctivae are normal.  Head: Atraumatic. Mouth/Throat: Mucous membranes are moist. Oropharynx non-erythematous. Neck: No stridor.   Cardiovascular: Normal rate, regular rhythm. Good peripheral circulation. Grossly normal heart sounds. Respiratory: Normal respiratory effort.  No retractions. Lungs CTAB. Gastrointestinal: Soft and nontender. No distention.    Musculoskeletal: No lower extremity tenderness nor edema. No gross deformities of extremities.Midline thoracic tenderness to palpation. Neurologic:  Normal speech and language. No gross focal neurologic deficits are appreciated.  Skin:  Skin is warm, dry and intact. No rash noted.   ____________________________________________   LABS (all labs ordered are listed, but only abnormal results are displayed)  Labs Reviewed - No data to display  Procedures   ____________________________________________   INITIAL IMPRESSION / ASSESSMENT AND PLAN / ED COURSE  Pertinent labs & imaging results that were available during my care of the patient were reviewed by me and considered in my medical decision making (see chart for details).  46 year old female presenting with above stated history of physical exam concerning for muscular strain versus radiculopathy. Lidoderm patch applied patient given Percocet in the emergency department.  Will prescribe Lidoderm patch for home patient will be referred to [facility] Monday for further outpatient evaluation      ____________________________________________  FINAL CLINICAL IMPRESSION(S) / ED DIAGNOSES  Final diagnoses:  Thoracic myofascial strain, initial encounter     MEDICATIONS GIVEN DURING THIS VISIT:  Medications  lidocaine (LIDODERM) 5 % 1 patch (not administered)  oxyCODONE-acetaminophen (PERCOCET/ROXICET) 5-325 MG per tablet 1 tablet (not administered)     NEW OUTPATIENT MEDICATIONS STARTED DURING THIS VISIT:  New Prescriptions   No medications on file    Modified Medications   No medications on file    Discontinued Medications   No medications on file     Note:  This document was prepared using Dragon voice recognition software and may include unintentional dictation errors.    Darci Current, MD 10/04/16 [phone]

## 2016-10-04 NOTE — ED Notes (Signed)
Patient out to lobby to wait for ride

## 2016-10-04 NOTE — ED Notes (Signed)
At 04:05 patient to stat desk stating that she is going to leave in 2 hours. Patient again advised of policy. Patient was offered information to call a cab but patient refused.

## 2016-10-04 NOTE — ED Notes (Signed)
Patient to stat desk requesting address for a 24 hour pharmacy. Patient instructed that the policy is that she can not drive for 4 hours after receiving narcotics. Patient instructed that she could call a ride or wait 4 hours per policy.

## 2016-10-04 NOTE — ED Notes (Signed)
Patient to stat desk in NAD asking how many people are ahead of her. Patient updated on wait.

## 2016-10-04 NOTE — ED Notes (Signed)
Patient requesting to have CT scan of back.  MD Manson PasseyBrown informed. MD Manson PasseyBrown to bedside.

## 2016-10-04 NOTE — ED Notes (Signed)
Patient requesting to be discharged with prescription for percocet as she believes that tramadol is not managing her pain.  MD Manson PasseyBrown to bedside with this RN. MD Manson PasseyBrown reported he would order discharge her home with the prescription, however, she would have to stop taking the tramadol as it is dangerous and deleterious for the patient's health to take the two medications together. Patient verbalized understanding in front of MD Manson PasseyBrown and this RN.  Patient reported she would stop taking the tramadol while taking the percocet.

## 2016-10-04 NOTE — ED Notes (Signed)
Pt to desk requesting pain medication, pt offered ibuprofen or tylenol, pt declined.  Pt updated on wait.

## 2016-10-04 NOTE — ED Notes (Signed)
Reviewed d/c instructions, follow-up care, prescription with patient. Pt verbalized understanding.  

## 2016-10-04 NOTE — ED Notes (Signed)
At 03:55 patient to stat desk stating that she is going to leave in 2 hours. Patient states that she drove here after taking Tramadol and that she can drive after taking narcotics. Patent advised of policy to either call a ride or wait 4 hours after receiving narcotics.

## 2016-10-04 NOTE — ED Triage Notes (Signed)
Patient ambulating to stat desk in NAD. Patient asking about how many people are ahead of her. Patient given an update on wait time.

## 2016-10-04 NOTE — ED Notes (Signed)
ED Provider at bedside. 

## 2016-10-06 ENCOUNTER — Ambulatory Visit
Admission: EM | Admit: 2016-10-06 | Discharge: 2016-10-06 | Disposition: A | Discharge status: Home / Self Care | Payer: Medicaid Other | Attending: Family Medicine | Admitting: Family Medicine

## 2016-10-06 ENCOUNTER — Other Ambulatory Visit: Payer: Self-pay | Admitting: Orthopedic Surgery

## 2016-10-06 ENCOUNTER — Ambulatory Visit: Payer: Medicaid Other

## 2016-10-06 DIAGNOSIS — M5412 Radiculopathy, cervical region: Secondary | ICD-10-CM

## 2016-10-06 DIAGNOSIS — M25551 Pain in right hip: Secondary | ICD-10-CM

## 2016-10-06 DIAGNOSIS — Y92410 Unspecified street and highway as the place of occurrence of the external cause: Secondary | ICD-10-CM | POA: Diagnosis not present

## 2016-10-06 DIAGNOSIS — R52 Pain, unspecified: Secondary | ICD-10-CM

## 2016-10-06 DIAGNOSIS — S76011A Strain of muscle, fascia and tendon of right hip, initial encounter: Secondary | ICD-10-CM | POA: Insufficient documentation

## 2016-10-06 DIAGNOSIS — M542 Cervicalgia: Secondary | ICD-10-CM

## 2016-10-06 DIAGNOSIS — G8929 Other chronic pain: Secondary | ICD-10-CM

## 2016-10-06 MED ORDER — HYDROCODONE-ACETAMINOPHEN 5-325 MG PO TABS: ORAL_TABLET | ORAL | 0 refills | Status: DC
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
## 2016-10-06 NOTE — Discharge Instructions (Signed)
Rest, ice, tylenol as needed °
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
## 2016-10-08 ENCOUNTER — Institutional Professional Consult (permissible substitution): Payer: Medicaid Other | Admitting: Internal Medicine

## 2016-10-12 ENCOUNTER — Emergency Department
Admission: EM | Admit: 2016-10-12 | Discharge: 2016-10-12 | Disposition: A | Discharge status: Home / Self Care | Payer: Medicaid Other | Attending: Emergency Medicine | Admitting: Emergency Medicine

## 2016-10-12 ENCOUNTER — Encounter: Payer: Self-pay | Admitting: Emergency Medicine

## 2016-10-12 DIAGNOSIS — E039 Hypothyroidism, unspecified: Secondary | ICD-10-CM | POA: Insufficient documentation

## 2016-10-12 DIAGNOSIS — J45909 Unspecified asthma, uncomplicated: Secondary | ICD-10-CM | POA: Diagnosis not present

## 2016-10-12 DIAGNOSIS — Z79899 Other long term (current) drug therapy: Secondary | ICD-10-CM | POA: Insufficient documentation

## 2016-10-12 DIAGNOSIS — M546 Pain in thoracic spine: Secondary | ICD-10-CM | POA: Diagnosis not present

## 2016-10-12 MED ORDER — LIDOCAINE 5 % EX PTCH: 1.0000 | MEDICATED_PATCH | Freq: Two times a day (BID) | CUTANEOUS | 0 refills | Status: DC

## 2016-10-12 MED ORDER — KETOROLAC TROMETHAMINE 60 MG/2ML IM SOLN
60.0000 mg | Freq: Once | INTRAMUSCULAR | Status: AC
  Administered 2016-10-12: 60 mg via INTRAMUSCULAR
  Filled 2016-10-12: qty 2

## 2016-10-12 MED ORDER — DIAZEPAM 2 MG PO TABS: 2.0000 mg | ORAL_TABLET | Freq: Four times a day (QID) | ORAL | 0 refills | Status: DC | PRN

## 2016-10-12 MED ORDER — LIDOCAINE 5 % EX PTCH
1.0000 | MEDICATED_PATCH | CUTANEOUS | Status: DC
  Administered 2016-10-12: 1 via TRANSDERMAL
  Filled 2016-10-12: qty 1

## 2016-10-12 NOTE — Discharge Instructions (Signed)
Please follow up with your doctor as you have scheduled. Please follow up with orthopedic surgery to obtain and MRI

## 2016-10-12 NOTE — ED Triage Notes (Signed)
Patient to stat desk requesting a CT scan. Patient informed that she would have to be seen by a provider and then it would be ordered if needed based on her symptoms. Patient verbalized understanding. Patient ambulating without difficulty at this time.

## 2016-10-12 NOTE — ED Triage Notes (Signed)
Patient ambulating outside in no acute distress at this time.  
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
## 2016-10-12 NOTE — ED Triage Notes (Signed)
Patient ambulatory to triage with no difficulty. Patient with complaint of upper back pain times two weeks. Patient reports that she has been seen for the back pain but continues to have pain.

## 2016-10-31 ENCOUNTER — Institutional Professional Consult (permissible substitution): Payer: Medicaid Other | Admitting: Internal Medicine

## 2016-11-03 ENCOUNTER — Encounter: Payer: Self-pay | Admitting: Internal Medicine

## 2016-11-03 ENCOUNTER — Ambulatory Visit
Admission: EM | Admit: 2016-11-03 | Discharge: 2016-11-03 | Disposition: A | Discharge status: Home / Self Care | Payer: Medicaid Other | Attending: Family Medicine | Admitting: Family Medicine

## 2016-11-03 ENCOUNTER — Encounter: Payer: Self-pay | Admitting: *Deleted

## 2016-11-03 ENCOUNTER — Ambulatory Visit: Payer: Medicaid Other

## 2016-11-03 DIAGNOSIS — F1311 Sedative, hypnotic or anxiolytic abuse, in remission: Secondary | ICD-10-CM | POA: Insufficient documentation

## 2016-11-03 DIAGNOSIS — W19XXXA Unspecified fall, initial encounter: Secondary | ICD-10-CM

## 2016-11-03 DIAGNOSIS — M7050 Other bursitis of knee, unspecified knee: Secondary | ICD-10-CM | POA: Diagnosis not present

## 2016-11-03 DIAGNOSIS — Z9889 Other specified postprocedural states: Secondary | ICD-10-CM | POA: Diagnosis not present

## 2016-11-03 DIAGNOSIS — E782 Mixed hyperlipidemia: Secondary | ICD-10-CM | POA: Insufficient documentation

## 2016-11-03 DIAGNOSIS — E039 Hypothyroidism, unspecified: Secondary | ICD-10-CM | POA: Diagnosis not present

## 2016-11-03 DIAGNOSIS — J45909 Unspecified asthma, uncomplicated: Secondary | ICD-10-CM | POA: Diagnosis not present

## 2016-11-03 DIAGNOSIS — M797 Fibromyalgia: Secondary | ICD-10-CM | POA: Diagnosis not present

## 2016-11-03 DIAGNOSIS — G894 Chronic pain syndrome: Secondary | ICD-10-CM | POA: Insufficient documentation

## 2016-11-03 DIAGNOSIS — W07XXXA Fall from chair, initial encounter: Secondary | ICD-10-CM | POA: Diagnosis not present

## 2016-11-03 DIAGNOSIS — S8002XA Contusion of left knee, initial encounter: Secondary | ICD-10-CM | POA: Diagnosis not present

## 2016-11-03 DIAGNOSIS — K219 Gastro-esophageal reflux disease without esophagitis: Secondary | ICD-10-CM | POA: Diagnosis not present

## 2016-11-03 DIAGNOSIS — F250 Schizoaffective disorder, bipolar type: Secondary | ICD-10-CM | POA: Insufficient documentation

## 2016-11-03 MED ORDER — NAPROXEN 500 MG PO TABS: 500.0000 mg | ORAL_TABLET | Freq: Two times a day (BID) | ORAL | 0 refills | Status: DC

## 2016-11-03 NOTE — ED Triage Notes (Signed)
Pt felt 4 days ago  from her bed which it's about 12" tall

## 2016-11-03 NOTE — ED Provider Notes (Addendum)
[facility]    CSN: [id number] Arrival date & time: 11/03/16  1907     History   Chief Complaint Chief Complaint  Patient presents with  . Knee Injury    HPI Rhonda Palmer is a 46 y.o. female.   HPI  She states that 4 days ago she was pulling up on a chair in order to stand up on her bed which is about 12 inches off the floor when she fell forward landing on her left knee. She felt pain mostly medially. She then went to a flea market and a moderate amount of walking only exacerbating the area. Now she states that that she has pain medially over the tibia. It tends to hurt somewhat when rising from a seated position or when ascending stairs. He is morbidly obese limiting the examination. She does walk with an antalgic gait.          Past Medical History:  Diagnosis Date  . Asthma   . Edema leg for twenty years per patient   taking lasix for edema  . Fibromyalgia   . Ovarian cyst   . Thyroid disease     Patient Active Problem List   Diagnosis Date Noted  . Long term (current) use of opiate analgesic 06/26/2016  . Long term prescription opiate use 06/26/2016  . Opiate use 06/26/2016  . Obesity 01/17/2016  . History of opioid abuse 01/17/2016  . Asthma 01/17/2016  . Mixed hyperlipidemia 10/09/2015  . Hypokalemia 10/09/2015  . Headache above the eye region 06/16/2013  . Edema extremities 05/18/2013  . 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 GERD (gastroesophageal reflux disease) 04/11/2013  . Personality disorder (HCC) 10/16/2012  . Chronic pain syndrome 06/18/2012  . S/P ankle fusion 05/21/2012  . Schizoaffective disorder, bipolar type (HCC) 02/20/2012  . 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 Hypothyroidism 11/16/2002    Past Surgical History:  Procedure Laterality Date  . ANKLE SURGERY     pt. states long time ago    OB History      Gravida Para Term Preterm AB Living   SAB TAB Ectopic Multiple Live Births                   Home Medications    Prior to Admission medications   Medication Sig Start Date End Date Taking? Authorizing Provider  ibuprofen (ADVIL,MOTRIN) 600 MG tablet Take 600 mg by mouth every 6 (six) hours as needed.   Yes [provider]  levothyroxine (SYNTHROID, LEVOTHROID) 100 MCG tablet Take 100 mcg by mouth daily before breakfast.   Yes [provider]  meloxicam (MOBIC) 7.5 MG tablet Take 7.5 mg by mouth daily.   Yes [provider]  venlafaxine (EFFEXOR) 100 MG tablet Take 100 mg by mouth 2 (two) times daily.   Yes [provider]  albuterol (PROVENTIL HFA;VENTOLIN HFA) 108 (90 Base) MCG/ACT inhaler Inhale 2 puffs into the lungs every 6 (six) hours as needed for wheezing or shortness of breath. 05/13/16   Nita Sickle, MD  ARIPiprazole (ABILIFY) 10 MG tablet Take 10 mg by mouth daily.    [provider]  furosemide (LASIX) 20 MG tablet Take 2 tablets (40 mg total) by mouth daily. 05/13/16   Nita Sickle, MD  haloperidol (HALDOL) 10 MG tablet Take 10 mg by mouth 4 (four)  times daily.    [provider]  naproxen (NAPROSYN) 500 MG tablet Take 1 tablet (500 mg total) by mouth 2 (two) times daily with a meal. 11/03/16   Lutricia Feil, PA-C    Family History Family History  Problem Relation Age of Onset  . Heart failure Mother     Social History Social History  Substance Use Topics  . Smoking status: Never Smoker  . Smokeless tobacco: Never Used  . Alcohol use No     Allergies   Codeine; Sulfa antibiotics; Penicillins; and Aspirin   Review of Systems Review of Systems  Constitutional: Positive for activity change. Negative for appetite change, chills, fatigue and fever.  Musculoskeletal: Positive for gait problem and myalgias.  All other systems reviewed and are negative.    Physical Exam Triage  Vital Signs ED Triage Vitals  Enc Vitals Group     BP 11/03/16 1919 139/75     Pulse Rate 11/03/16 1919 90     Resp 11/03/16 1919 12     Temp 11/03/16 1919 98.6 F (37 C)     Temp Source 11/03/16 1919 Oral     SpO2 11/03/16 1919 99 %     Weight 11/03/16 1919 250 lb (113.4 kg)     Height 11/03/16 1919  (1.549 m)     Head Circumference --      Peak Flow --      Pain Score 11/03/16 1920 8     Pain Loc --      Pain Edu? --      Excl. in GC? --    No data found.   Updated Vital Signs BP 139/75 (BP Location: Left Arm)   Pulse 90   Temp 98.6 F (37 C) (Oral)   Resp 12   Ht  (1.549 m)   Wt 250 lb (113.4 kg)   SpO2 99%   BMI 47.24 kg/m   Visual Acuity Right Eye Distance:   Left Eye Distance:   Bilateral Distance:    Right Eye Near:   Left Eye Near:    Bilateral Near:     Physical Exam  Constitutional: She is oriented to person, place, and time. She appears well-developed and well-nourished. No distress.  HENT:  Head: Normocephalic.  Eyes: Pupils are equal, round, and reactive to light.  Neck: Normal range of motion.  Musculoskeletal: Normal range of motion. She exhibits tenderness.  Patient has a well-healed surgical scar from previous surgery. 3 difficult to ascertain if the effusion is present but there may be a small effusion present. She has good quad control. Is good range of motion passively. Collateral ligaments are intact. She has a negative Lachman sign. Negative for posterior drawer sign. He does not have significant patellofemoral tenderness. Her tenderness is medial over the pes anserine  bursa.  Neurological: She is alert and oriented to person, place, and time.  Skin: Skin is warm and dry. She is not diaphoretic.  Psychiatric: She has a normal mood and affect. Her behavior is normal. Judgment and thought content normal.  Nursing note and vitals reviewed.    UC Treatments / Results  Labs (all labs ordered are listed, but only abnormal results  are displayed) Labs Reviewed - No data to display  EKG  EKG Interpretation None       Radiology Dg Knee Complete 4 Views Left  Result Date: 11/03/2016 CLINICAL DATA:  Patient fell 4 days ago with medial pain. EXAM: LEFT KNEE - COMPLETE  4+ VIEW COMPARISON:  None. FINDINGS: Four views study shows loss of medial joint space with hypertrophic spurring in all 3 compartments. Postsurgical changes evident. No worrisome lytic or sclerotic osseous abnormality. No evidence for joint effusion. IMPRESSION: No acute bony abnormality. Tricompartmental degenerative change. Electronically Signed   By: Kennith Center M.D.   On: 11/03/2016 20:18    Procedures Procedures (including critical care time) Patient was given a knee immobilizer for active use, out of it when quiet and also for doing quadriceps strengthening exercises Medications Ordered in UC Medications - No data to display   Initial Impression / Assessment and Plan / UC Course  I have reviewed the triage vital signs and the nursing notes.  Pertinent labs & imaging results that were available during my care of the patient were reviewed by me and considered in my medical decision making (see chart for details).     Plan: 1. Test/x-ray results and diagnosis reviewed with patient 2. rx as per orders; risks, benefits, potential side effects reviewed with patient 3. Recommend supportive treatment with rest and symptom avoidance. Use ice 20 minutes out of every 2 hours 4-5 times daily. Patient was instructed in quadriceps strengthening isometric exercises. If she is still having problems she should follow-up with PA Mundy in 1-2 weeks.  4. F/u prn if symptoms worsen or don't improve   Final Clinical Impressions(s) / UC Diagnoses   Final diagnoses:  Pes anserine bursitis  Contusion of left knee, initial encounter    New Prescriptions Discharge Medication List as of 11/03/2016  8:30 PM       Controlled Substance Prescriptions [place]  Controlled Substance Registry consulted? Not Applicable   Lutricia Feil, PA-C 11/03/16 2031    Lutricia Feil, PA-C 11/03/16 2033

## 2016-11-18 ENCOUNTER — Ambulatory Visit: Payer: Medicaid Other | Admitting: Physical Therapy

## 2016-12-02 ENCOUNTER — Ambulatory Visit: Payer: Medicaid Other | Admitting: Physical Therapy

## 2017-01-11 ENCOUNTER — Emergency Department
Admission: EM | Admit: 2017-01-11 | Discharge: 2017-01-11 | Disposition: A | Discharge status: Home / Self Care | Payer: Medicaid Other | Attending: Emergency Medicine | Admitting: Emergency Medicine

## 2017-01-11 ENCOUNTER — Other Ambulatory Visit: Payer: Self-pay

## 2017-01-11 ENCOUNTER — Encounter: Payer: Self-pay | Admitting: Emergency Medicine

## 2017-01-11 ENCOUNTER — Emergency Department: Payer: Medicaid Other

## 2017-01-11 DIAGNOSIS — E039 Hypothyroidism, unspecified: Secondary | ICD-10-CM | POA: Insufficient documentation

## 2017-01-11 DIAGNOSIS — R1031 Right lower quadrant pain: Secondary | ICD-10-CM | POA: Diagnosis present

## 2017-01-11 DIAGNOSIS — Z79899 Other long term (current) drug therapy: Secondary | ICD-10-CM | POA: Diagnosis not present

## 2017-01-11 DIAGNOSIS — J45909 Unspecified asthma, uncomplicated: Secondary | ICD-10-CM | POA: Diagnosis not present

## 2017-01-11 HISTORY — DX: Schizoaffective disorder, bipolar type: F25.0

## 2017-01-11 HISTORY — DX: Bipolar disorder, unspecified: F31.9

## 2017-01-11 HISTORY — DX: Schizoaffective disorder, unspecified: F25.9

## 2017-01-11 LAB — COMPREHENSIVE METABOLIC PANEL
ALBUMIN: 3.6 g/dL (ref 3.5–5.0)
ALK PHOS: 124 U/L (ref 38–126)
ALT: 15 U/L (ref 14–54)
ANION GAP: 10 (ref 5–15)
AST: 28 U/L (ref 15–41)
BILIRUBIN TOTAL: 0.7 mg/dL (ref 0.3–1.2)
BUN: 11 mg/dL (ref 6–20)
CALCIUM: 9.3 mg/dL (ref 8.9–10.3)
CO2: 28 mmol/L (ref 22–32)
Chloride: 100 mmol/L — ABNORMAL LOW (ref 101–111)
Creatinine, Ser: 0.57 mg/dL (ref 0.44–1.00)
GLUCOSE: 115 mg/dL — AB (ref 65–99)
POTASSIUM: 4 mmol/L (ref 3.5–5.1)
Sodium: 138 mmol/L (ref 135–145)
TOTAL PROTEIN: 7.6 g/dL (ref 6.5–8.1)

## 2017-01-11 LAB — URINALYSIS, COMPLETE (UACMP) WITH MICROSCOPIC
BACTERIA UA: NONE SEEN
Bilirubin Urine: NEGATIVE
GLUCOSE, UA: NEGATIVE mg/dL
HGB URINE DIPSTICK: NEGATIVE
Ketones, ur: NEGATIVE mg/dL
LEUKOCYTES UA: NEGATIVE
NITRITE: NEGATIVE
PH: 7 (ref 5.0–8.0)
Protein, ur: NEGATIVE mg/dL
RBC / HPF: NONE SEEN RBC/hpf (ref 0–5)
Specific Gravity, Urine: 1.008 (ref 1.005–1.030)

## 2017-01-11 LAB — CBC
HEMATOCRIT: 36.1 % (ref 35.0–47.0)
HEMOGLOBIN: 11.7 g/dL — AB (ref 12.0–16.0)
MCH: 26.3 pg (ref 26.0–34.0)
MCHC: 32.3 g/dL (ref 32.0–36.0)
MCV: 81.4 fL (ref 80.0–100.0)
Platelets: 388 10*3/uL (ref 150–440)
RBC: 4.43 MIL/uL (ref 3.80–5.20)
RDW: 15.4 % — ABNORMAL HIGH (ref 11.5–14.5)
WBC: 11 10*3/uL (ref 3.6–11.0)

## 2017-01-11 LAB — LIPASE, BLOOD: Lipase: 16 U/L (ref 11–51)

## 2017-01-11 MED ORDER — ONDANSETRON HCL 4 MG/2ML IJ SOLN: 4.0000 mg | Freq: Once | INTRAMUSCULAR | Status: DC | PRN

## 2017-01-11 MED ORDER — SODIUM CHLORIDE 0.9 % IV BOLUS (SEPSIS)
1000.0000 mL | Freq: Once | INTRAVENOUS | Status: AC
  Administered 2017-01-11: 1000 mL via INTRAVENOUS

## 2017-01-11 MED ORDER — TRAMADOL HCL 50 MG PO TABS: 50.0000 mg | ORAL_TABLET | Freq: Four times a day (QID) | ORAL | 0 refills | Status: DC | PRN

## 2017-01-11 MED ORDER — MORPHINE SULFATE (PF) 4 MG/ML IV SOLN
4.0000 mg | Freq: Once | INTRAVENOUS | Status: AC
  Administered 2017-01-11: 4 mg via INTRAVENOUS
  Filled 2017-01-11: qty 1

## 2017-01-11 MED ORDER — IBUPROFEN 800 MG PO TABS: 800.0000 mg | ORAL_TABLET | Freq: Three times a day (TID) | ORAL | 0 refills | Status: DC | PRN

## 2017-01-11 MED ORDER — ONDANSETRON HCL 4 MG/2ML IJ SOLN
4.0000 mg | Freq: Once | INTRAMUSCULAR | Status: AC
  Administered 2017-01-11: 4 mg via INTRAVENOUS
  Filled 2017-01-11: qty 2

## 2017-01-11 NOTE — ED Notes (Signed)
Patient transported to CT 

## 2017-01-11 NOTE — ED Provider Notes (Signed)
[facility] Emergency Department Provider Note ____________________________________________   I have reviewed the triage vital signs and the triage nursing note.  HISTORY  Chief Complaint Abdominal Pain   Historian Patient  HPI Rhonda Palmer Lise Bromwell is a 46 y.o. female presents with sharp right lower quadrant pain that started this morning when she woke up and is fairly intermittent but has been there most of the time.  She is never had pain like this before.  She is reporting some urinary frequency without hematuria.  patient diarrhea.  No nausea or vomiting.  No chest pain or coughing.  No fevers.  She has had a hysterectomy.    Past Medical History:  Diagnosis Date  . Asthma   . Bipolar 1 disorder (HCC)   . Edema leg for twenty years per patient   taking lasix for edema  . Fibromyalgia   . Ovarian cyst   . Schizo affective schizophrenia (HCC)   . Thyroid disease     Patient Active Problem List   Diagnosis Date Noted  . Long term (current) use of opiate analgesic 06/26/2016  . Long term prescription opiate use 06/26/2016  . Opiate use 06/26/2016  . Obesity 01/17/2016  . History of opioid abuse 01/17/2016  . Asthma 01/17/2016  . Mixed hyperlipidemia 10/09/2015  . Hypokalemia 10/09/2015  . Headache above the eye region 06/16/2013  . Edema extremities 05/18/2013  . 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 GERD (gastroesophageal reflux disease) 04/11/2013  . Personality disorder (HCC) 10/16/2012  . Chronic pain syndrome 06/18/2012  . S/P ankle fusion 05/21/2012  . Schizoaffective disorder, bipolar type (HCC) 02/20/2012  . 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 Hypothyroidism 11/16/2002    Past Surgical History:  Procedure Laterality Date  . ANKLE SURGERY     pt. states long time ago    Prior to Admission medications   Medication Sig  Start Date End Date Taking? Authorizing Provider  albuterol (PROVENTIL HFA;VENTOLIN HFA) 108 (90 Base) MCG/ACT inhaler Inhale 2 puffs into the lungs every 6 (six) hours as needed for wheezing or shortness of breath. 05/13/16   Nita SickleVeronese, [place], MD  ARIPiprazole (ABILIFY) 10 MG tablet Take 10 mg by mouth daily.    [provider]  furosemide (LASIX) 20 MG tablet Take 2 tablets (40 mg total) by mouth daily. 05/13/16   Nita SickleVeronese, [place], MD  haloperidol (HALDOL) 10 MG tablet Take 10 mg by mouth 4 (four) times daily.    [provider]  ibuprofen (ADVIL,MOTRIN) 600 MG tablet Take 600 mg by mouth every 6 (six) hours as needed.    [provider]  levothyroxine (SYNTHROID, LEVOTHROID) 100 MCG tablet Take 100 mcg by mouth daily before breakfast.    [provider]  meloxicam (MOBIC) 7.5 MG tablet Take 7.5 mg by mouth daily.    [provider]  naproxen (NAPROSYN) 500 MG tablet Take 1 tablet (500 mg total) by mouth 2 (two) times daily with a meal. 11/03/16   Lutricia Feiloemer, William P, PA-C  venlafaxine (EFFEXOR) 100 MG tablet Take 100 mg by mouth 2 (two) times daily.    [provider]    Allergies  Allergen Reactions  . Codeine Hives, Itching, Rash and Swelling  . Sulfa Antibiotics Anaphylaxis  . Penicillins Itching  . Aspirin Rash    Family History  Problem Relation Age of Onset  . Heart failure Mother  Social History Social History   Tobacco Use  . Smoking status: Never Smoker  . Smokeless tobacco: Never Used  Substance Use Topics  . Alcohol use: No  . Drug use: No    Review of Systems  Constitutional: Negative for fever. Eyes: Negative for visual changes. ENT: Negative for sore throat. Cardiovascular: Negative for chest pain. Respiratory: Negative for shortness of breath. Gastrointestinal: Negative for vomiting and diarrhea. Genitourinary: For frequency of urination. Musculoskeletal: Right-sided flank pain, more so on the right  lower quadrant n. Skin: Negative for rash. Neurological: Negative for headache.  ____________________________________________   PHYSICAL EXAM:  VITAL SIGNS: ED Triage Vitals  Enc Vitals Group     BP 01/11/17 0828 (!) 143/108     Pulse Rate 01/11/17 0828 94     Resp 01/11/17 0828 20     Temp 01/11/17 0828 99 F (37.2 C)     Temp Source 01/11/17 0828 Oral     SpO2 01/11/17 0828 94 %     Weight 01/11/17 0829 260 lb (117.9 kg)     Height 01/11/17 0829 5' (1.524 m)     Head Circumference --      Peak Flow --      Pain Score 01/11/17 0828 8     Pain Loc --      Pain Edu? --      Excl. in GC? --      Constitutional: Alert and oriented. Well appearing and in no distress. HEENT   Head: Normocephalic and atraumatic.      Eyes: Conjunctivae are normal. Pupils equal and round.       Ears:         Nose: No congestion/rhinnorhea.   Mouth/Throat: Mucous membranes are moist.   Neck: No stridor. Cardiovascular/Chest: Normal rate, regular rhythm.  No murmurs, rubs, or gallops. Respiratory: Normal respiratory effort without tachypnea nor retractions. Breath sounds are clear and equal bilaterally. No wheezes/rales/rhonchi. Gastrointestinal: Soft. No distention, no guarding, no rebound.  Obese, moderate discomfort in the right side abdomen down to the right lower quadrant without focal McBurney's point tenderness. Genitourinary/rectal:Deferred Musculoskeletal: Nontender with normal range of motion in all extremities. No joint effusions.  No lower extremity tenderness.  No edema. Neurologic:  Normal speech and language. No gross or focal neurologic deficits are appreciated. Skin:  Skin is warm, dry and intact. No rash noted. Psychiatric: Mood and affect are normal. Speech and behavior are normal. Patient exhibits appropriate insight and judgment.   ____________________________________________  LABS (pertinent positives/negatives) I, Governor Rooks, MD the attending physician have  reviewed the labs noted below.  Labs Reviewed  COMPREHENSIVE METABOLIC PANEL - Abnormal; Notable for the following components:      Result Value   Chloride 100 (*)    Glucose, Bld 115 (*)    All other components within normal limits  CBC - Abnormal; Notable for the following components:   Hemoglobin 11.7 (*)    RDW 15.4 (*)    All other components within normal limits  URINALYSIS, COMPLETE (UACMP) WITH MICROSCOPIC - Abnormal; Notable for the following components:   Color, Urine YELLOW (*)    APPearance HAZY (*)    Squamous Epithelial / LPF 6-30 (*)    All other components within normal limits  LIPASE, BLOOD    ____________________________________________    EKG I, Governor Rooks, MD, the attending physician have personally viewed and interpreted all ECGs.  None ____________________________________________  RADIOLOGY All Xrays were viewed by me.  Imaging interpreted by Radiologist,  and I, Governor Rooksebecca Dishawn Bhargava, MD the attending physician have reviewed the radiologist interpretation noted below.  CT abdomen pelvis without contrast:IMPRESSION: Mild diverticulosis without diverticulitis. No acute abnormality is Noted  US pelvis/tv with doppler:  IMPRESSION: Neither ovary could be visualized. No adnexal mass seen. No acute findings.  Prior hysterectomy. __________________________________________  PROCEDURES  Procedure(s) performed: None  Critical Care performed: None   ____________________________________________  ED COURSE / ASSESSMENT AND PLAN  Pertinent labs & imaging results that were available during my care of the patient were reviewed by me and considered in my medical decision making (see chart for details).   Patient's description of pain sounds like it may be related to the urinary system, although she is never had a kidney stone before, we did decide to proceed with CT scan imaging after discussion of risk and benefit with regard to radiation exposure.  CT  scan is overall reassuring with no emergency cause found.  We discussed CT is not the best for evaluating for ovarian cyst and torsion, will send for evaluation.  Ultrasound is reassuring.  Unclear etiology of the discomfort, but we discussed GI may be likely.  In any case ED evaluation and workup is overall reassuring.  I am going to recommend ibuprofen and close follow-up with primary care physician.  We discussed return precautions.  DIFFERENTIAL DIAGNOSIS: Differential diagnosis includes, but is not limited to, ovarian cyst, ovarian torsion, acute appendicitis, diverticulitis, urinary tract infection/pyelonephritis, endometriosis, bowel obstruction, colitis, renal colic, gastroenteritis, hernia, fibroids, endometriosis, pregnancy related pain including ectopic pregnancy, etc.   CONSULTATIONS:   None   Patient / Family / Caregiver informed of clinical course, medical decision-making process, and agree with plan.   I discussed return precautions, follow-up instructions, and discharge instructions with patient and/or family.  Discharge Instructions : You were evaluated for abdominal pain and although no certain cause was found your exam and evaluation are overall reassuring in the emergency room today.  Return to the ER for any uncontrolled pain, bloody stool, black stool, fever, or any other symptoms concerning to you.    ___________________________________________   FINAL CLINICAL IMPRESSION(S) / ED DIAGNOSES   Final diagnoses:  Right lower quadrant abdominal pain      ___________________________________________        Note: This dictation was prepared with Dragon dictation. Any transcriptional errors that result from this process are unintentional    Governor RooksLord, Barbaraann Avans, MD 01/11/17 [phone]

## 2017-01-11 NOTE — Discharge Instructions (Addendum)
You were evaluated for abdominal pain and although no certain cause was found your exam and evaluation are overall reassuring in the emergency room today.  Return to the ER for any uncontrolled pain, bloody stool, black stool, fever, or any other symptoms concerning to you.

## 2017-01-11 NOTE — ED Triage Notes (Signed)
Pt reports that she developed RLQ pain with more increased frequency in urination. She is also having Nausea but no vomiting. Denies any diarrhea.

## 2017-01-11 NOTE — ED Notes (Signed)
Patient transported to Ultrasound 

## 2017-03-07 ENCOUNTER — Encounter: Payer: Self-pay | Admitting: *Deleted

## 2017-03-07 ENCOUNTER — Emergency Department: Payer: Medicaid Other

## 2017-03-07 ENCOUNTER — Other Ambulatory Visit: Payer: Self-pay

## 2017-03-07 ENCOUNTER — Emergency Department
Admission: EM | Admit: 2017-03-07 | Discharge: 2017-03-07 | Disposition: A | Discharge status: Home / Self Care | Payer: Medicaid Other | Attending: Emergency Medicine | Admitting: Emergency Medicine

## 2017-03-07 DIAGNOSIS — Y9289 Other specified places as the place of occurrence of the external cause: Secondary | ICD-10-CM | POA: Insufficient documentation

## 2017-03-07 DIAGNOSIS — Y9301 Activity, walking, marching and hiking: Secondary | ICD-10-CM | POA: Diagnosis not present

## 2017-03-07 DIAGNOSIS — W19XXXA Unspecified fall, initial encounter: Secondary | ICD-10-CM

## 2017-03-07 DIAGNOSIS — Z79899 Other long term (current) drug therapy: Secondary | ICD-10-CM | POA: Insufficient documentation

## 2017-03-07 DIAGNOSIS — E039 Hypothyroidism, unspecified: Secondary | ICD-10-CM | POA: Diagnosis not present

## 2017-03-07 DIAGNOSIS — J45909 Unspecified asthma, uncomplicated: Secondary | ICD-10-CM | POA: Insufficient documentation

## 2017-03-07 DIAGNOSIS — M79661 Pain in right lower leg: Secondary | ICD-10-CM | POA: Insufficient documentation

## 2017-03-07 DIAGNOSIS — W010XXA Fall on same level from slipping, tripping and stumbling without subsequent striking against object, initial encounter: Secondary | ICD-10-CM | POA: Diagnosis not present

## 2017-03-07 DIAGNOSIS — Y998 Other external cause status: Secondary | ICD-10-CM | POA: Diagnosis not present

## 2017-03-07 MED ORDER — TRAMADOL HCL 50 MG PO TABS
100.0000 mg | ORAL_TABLET | Freq: Once | ORAL | Status: AC
  Administered 2017-03-07: 100 mg via ORAL
  Filled 2017-03-07: qty 2

## 2017-03-07 MED ORDER — TRAMADOL HCL 50 MG PO TABS: 50.0000 mg | ORAL_TABLET | Freq: Four times a day (QID) | ORAL | 0 refills | Status: AC | PRN

## 2017-03-07 NOTE — ED Notes (Signed)
RN demonstrated use of crutches and pt reported she has used them in the past and feels comfortable using them. Pt also reports understanding of discharge prescription, instructions and home care. Pt taken to lobby via wheelchair by this RN to wait for husband to provide ride home. PT verbalized she has all belongings and has a black purse in her possesion at time of discharge. NAD noted

## 2017-03-07 NOTE — ED Triage Notes (Signed)
Per EMS report, patient fell in her yard after stepping on a pod from a sweetgum tree. Patient states her right leg ended up underneath her. Patient states she was able to stand and pivot, but could not walk.

## 2017-03-07 NOTE — ED Provider Notes (Signed)
[facility] Emergency Department Provider Note  ____________________________________________  Time seen: Approximately 6:35 PM  I have reviewed the triage vital signs and the nursing notes.   HISTORY  Chief Complaint Fall    HPI Rhonda Palmer is a 47 y.o. female presents to the emergency department with 10/10 right lower extremity pain after patient reports that she slipped outside.  Patient reports that her leg was bent "underneath me".  Patient denies radiculopathy but states that she has difficulty with ambulation partly because she is afraid.  She denies skin compromise.  No changes in sensation.  Past Medical History:  Diagnosis Date  . Asthma   . Bipolar 1 disorder (HCC)   . Edema leg for twenty years per patient   taking lasix for edema  . Fibromyalgia   . Ovarian cyst   . Schizo affective schizophrenia (HCC)   . Thyroid disease     Patient Active Problem List   Diagnosis Date Noted  . Long term (current) use of opiate analgesic 06/26/2016  . Long term prescription opiate use 06/26/2016  . Opiate use 06/26/2016  . Obesity 01/17/2016  . History of opioid abuse 01/17/2016  . Asthma 01/17/2016  . Mixed hyperlipidemia 10/09/2015  . Hypokalemia 10/09/2015  . Headache above the eye region 06/16/2013  . Edema extremities 05/18/2013  . 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 GERD (gastroesophageal reflux disease) 04/11/2013  . Personality disorder (HCC) 10/16/2012  . Chronic pain syndrome 06/18/2012  . S/P ankle fusion 05/21/2012  . Schizoaffective disorder, bipolar type (HCC) 02/20/2012  . 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 Hypothyroidism 11/16/2002    Past Surgical History:  Procedure Laterality Date  . ABDOMINAL HYSTERECTOMY    . ANKLE SURGERY     pt. states long time ago    Prior to Admission medications   Medication  Sig Start Date End Date Taking? Authorizing Provider  albuterol (PROVENTIL HFA;VENTOLIN HFA) 108 (90 Base) MCG/ACT inhaler Inhale 2 puffs into the lungs every 6 (six) hours as needed for wheezing or shortness of breath. 05/13/16   Nita SickleVeronese, Dennison, MD  ARIPiprazole (ABILIFY) 10 MG tablet Take 10 mg by mouth daily.    [provider]  furosemide (LASIX) 20 MG tablet Take 2 tablets (40 mg total) by mouth daily. 05/13/16   Nita SickleVeronese, Caban, MD  haloperidol (HALDOL) 10 MG tablet Take 10 mg by mouth 4 (four) times daily.    [provider]  ibuprofen (ADVIL,MOTRIN) 800 MG tablet Take 1 tablet (800 mg total) by mouth every 8 (eight) hours as needed. 01/11/17   Governor RooksLord, Rebecca, MD  levothyroxine (SYNTHROID, LEVOTHROID) 100 MCG tablet Take 100 mcg by mouth daily before breakfast.    [provider]  meloxicam (MOBIC) 7.5 MG tablet Take 7.5 mg by mouth daily.    [provider]  naproxen (NAPROSYN) 500 MG tablet Take 1 tablet (500 mg total) by mouth 2 (two) times daily with a meal. 11/03/16   Lutricia Feiloemer, William P, PA-C  traMADol (ULTRAM) 50 MG tablet Take 1 tablet (50 mg total) by mouth every 6 (six) hours as needed for up to 3 days. 03/07/17 03/10/17  Orvil FeilWoods, Jaclyn M, PA-C  venlafaxine (EFFEXOR) 100 MG tablet Take 100 mg by mouth 2 (two) times daily.    [provider]    Allergies Codeine; Sulfa antibiotics; Penicillins; and Aspirin  Family History  Problem Relation Age of Onset  .  Heart failure Mother     Social History Social History   Tobacco Use  . Smoking status: Never Smoker  . Smokeless tobacco: Never Used  Substance Use Topics  . Alcohol use: No  . Drug use: No     Review of Systems  Constitutional: No fever/chills Eyes: No visual changes. No discharge ENT: No upper respiratory complaints. Cardiovascular: no chest pain. Respiratory: no cough. No SOB. Musculoskeletal: Patient has right thigh and shank pain.  Skin: Negative for rash,  abrasions, lacerations, ecchymosis. Neurological: Negative for headaches, focal weakness or numbness.  ____________________________________________   PHYSICAL EXAM:  VITAL SIGNS: ED Triage Vitals  Enc Vitals Group     BP 03/07/17 1720 (!) 153/105     Pulse Rate 03/07/17 1720 96     Resp 03/07/17 1720 18     Temp 03/07/17 1720 98.2 F (36.8 C)     Temp Source 03/07/17 1720 Oral     SpO2 03/07/17 1717 96 %     Weight 03/07/17 1721 270 lb (122.5 kg)     Height 03/07/17 1721 5\' 1"  (1.549 m)     Head Circumference --      Peak Flow --      Pain Score 03/07/17 1721 8     Pain Loc --      Pain Edu? --      Excl. in GC? --      Constitutional: Alert and oriented. Well appearing and in no acute distress. Eyes: Conjunctivae are normal. PERRL. EOMI. Head: Atraumatic. Cardiovascular: Normal rate, regular rhythm. Normal S1 and S2.  Good peripheral circulation. Respiratory: Normal respiratory effort without tachypnea or retractions. Lungs CTAB. Good air entry to the bases with no decreased or absent breath sounds. Musculoskeletal: Patient performs passive range of motion at the right hip and right knee.  Patient has pain with palpation over the distal right thigh and right shank.  Palpable dorsalis pedis pulse, right. Neurologic:  Normal speech and language. No gross focal neurologic deficits are appreciated.  Skin:  Skin is warm, dry and intact. No rash noted. Psychiatric: Mood and affect are normal. Speech and behavior are normal. Patient exhibits appropriate insight and judgement.   ____________________________________________   LABS (all labs ordered are listed, but only abnormal results are displayed)  Labs Reviewed - No data to display ____________________________________________  EKG   ____________________________________________  RADIOLOGY Geraldo Pitter, personally viewed and evaluated these images (plain radiographs) as part of my medical decision making, as well  as reviewing the written report by the radiologist.    Dg Tibia/fibula Right  Result Date: 03/07/2017 CLINICAL DATA:  Pain following fall EXAM: RIGHT TIBIA AND FIBULA - 2 VIEW COMPARISON:  None. FINDINGS: Frontal and lateral views were obtained. 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 There is osteoarthritic change in the hindfoot. There has been surgical removal of the distal fibula. No acute fracture or dislocation.  No abnormal periosteal reaction. IMPRESSION: Evidence of old trauma in the ankle region with surgical removal of the distal tibia. 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 No acute fracture or dislocation. Electronically Signed   By: Bretta Bang III M.D.   On: 03/07/2017 18:53   Dg Knee Complete 4 Views Right  Result Date: 03/07/2017 CLINICAL DATA:  Pain following fall EXAM: RIGHT KNEE - COMPLETE 4+ VIEW COMPARISON:  None. FINDINGS: Frontal, lateral, and bilateral  oblique views were obtained. There is no fracture or dislocation. No evident joint effusion. There is mild generalized joint space narrowing with slightly greater narrowing medially than elsewhere. There is spurring in all compartments. No erosive change. IMPRESSION: Osteoarthritic change, slightly more notable in the medial compartment than elsewhere but with involvement of all compartments. No fracture or joint effusion. Electronically Signed   By: Bretta Bang III M.D.   On: 03/07/2017 18:52    ____________________________________________    PROCEDURES  Procedure(s) performed:    Procedures    Medications  traMADol (ULTRAM) tablet 100 mg (100 mg Oral Given 03/07/17 1850)     ____________________________________________   INITIAL IMPRESSION / ASSESSMENT AND PLAN / ED COURSE  Pertinent labs & imaging results that were available during my care  of the patient were reviewed by me and considered in my medical decision making (see chart for details).  Review of the [place] CSRS was performed in accordance of the [facility] prior to dispensing any controlled drugs.     Assessment and Plan: Right lower extremity pain: Patient presents to the emergency department with right lower extremity pain after a fall.  Patient exhibited tenderness of the right thigh and right shank.  Differential diagnosis included contusion versus fracture.  X-ray examination revealed no acute fractures or bony abnormalities.  Patient was discharged with a short course of tramadol and advised to follow-up with primary care as needed.  All patient questions were answered.    ____________________________________________  FINAL CLINICAL IMPRESSION(S) / ED DIAGNOSES  Final diagnoses:  Fall, initial encounter      NEW MEDICATIONS STARTED DURING THIS VISIT:  ED Discharge Orders        Ordered    traMADol (ULTRAM) 50 MG tablet  Every 6 hours PRN     03/07/17 1913          This chart was dictated using voice recognition software/Dragon. Despite best efforts to proofread, errors can occur which can change the meaning. Any change was purely unintentional.    Orvil Feil, PA-C 03/07/17 Brunetta Jeans, MD 03/07/17 2122

## 2017-03-18 ENCOUNTER — Encounter (INDEPENDENT_AMBULATORY_CARE_PROVIDER_SITE_OTHER): Payer: Self-pay | Admitting: Vascular Surgery

## 2017-03-18 ENCOUNTER — Other Ambulatory Visit (INDEPENDENT_AMBULATORY_CARE_PROVIDER_SITE_OTHER): Payer: Self-pay | Admitting: Vascular Surgery

## 2017-03-18 ENCOUNTER — Encounter (INDEPENDENT_AMBULATORY_CARE_PROVIDER_SITE_OTHER): Payer: Self-pay

## 2017-03-18 DIAGNOSIS — M7989 Other specified soft tissue disorders: Secondary | ICD-10-CM

## 2017-03-19 ENCOUNTER — Encounter (INDEPENDENT_AMBULATORY_CARE_PROVIDER_SITE_OTHER): Payer: Self-pay

## 2017-03-19 ENCOUNTER — Encounter (INDEPENDENT_AMBULATORY_CARE_PROVIDER_SITE_OTHER): Payer: Self-pay | Admitting: Vascular Surgery

## 2017-03-27 ENCOUNTER — Ambulatory Visit (INDEPENDENT_AMBULATORY_CARE_PROVIDER_SITE_OTHER): Payer: Medicaid Other | Admitting: Vascular Surgery

## 2017-03-27 ENCOUNTER — Encounter (INDEPENDENT_AMBULATORY_CARE_PROVIDER_SITE_OTHER): Payer: Self-pay | Admitting: Vascular Surgery

## 2017-03-27 ENCOUNTER — Ambulatory Visit (INDEPENDENT_AMBULATORY_CARE_PROVIDER_SITE_OTHER): Payer: Medicaid Other

## 2017-03-27 VITALS — BP 144/95 | HR 100 | Resp 18 | Wt 293.4 lb

## 2017-03-27 DIAGNOSIS — R600 Localized edema: Secondary | ICD-10-CM

## 2017-03-27 DIAGNOSIS — M7989 Other specified soft tissue disorders: Secondary | ICD-10-CM

## 2017-03-27 DIAGNOSIS — W19XXXA Unspecified fall, initial encounter: Secondary | ICD-10-CM | POA: Diagnosis not present

## 2017-03-27 MED ORDER — TRAMADOL HCL 50 MG PO TABS: ORAL_TABLET | ORAL | 0 refills | Status: DC

## 2017-03-27 NOTE — Progress Notes (Signed)
Subjective:    Patient ID: Rhonda Palmer, female    DOB: [birth year], 47 y.o.   MRN: [id number] Chief Complaint  Patient presents with  . Follow-up    bil leg swelling and pain   Presents as a new patient referred by Dr. Dario Guardian for evaluation of right lower extremity pain and edema.  The patient and endorses trauma to the right lower extremity after a fall over gum balls approximately 1 week ago.  The patient states her mechanism of fall was her right leg fell underneath her.  Since her fall the patient has been experiencing "horrible pain".  She was given tramadol for 2 days and told to follow-up in our office.  An ABI was ordered.  The patient was seen in the ED immediately after the fall taken there by EMS.  X-rays of the right leg yielded no fracture.  The patient states the pain is constant and worsens with walking.  The patient notes that her calf is very swollen.  Patient denies any erythema or ulceration to the right calf.  The patient underwent an ABI which was notable for triphasic tibials and no evidence of significant lower extremity arterial disease.  The bilateral toe brachial indices are normal.  The patient denies any shortness of breath or chest pain.  The patient states there is pain with palpation to her right calf.  The patient denies any fever, nausea or vomiting.   Review of Systems  Constitutional: Negative.   HENT: Negative.   Eyes: Negative.   Respiratory: Negative.   Cardiovascular: Positive for leg swelling.       Right lower extremity pain  Gastrointestinal: Negative.   Endocrine: Negative.   Genitourinary: Negative.   Musculoskeletal: Negative.   Skin: Negative.   Allergic/Immunologic: Negative.   Neurological: Negative.   Hematological: Negative.   Psychiatric/Behavioral: Negative.       Objective:   Physical Exam  Constitutional: She is oriented to person, place, and time. She appears well-developed and well-nourished. No distress.  Morbidly obese    HENT:  Head: Normocephalic and atraumatic.  Eyes: Conjunctivae are normal. Pupils are equal, round, and reactive to light.  Neck: Normal range of motion.  Cardiovascular: Normal rate, regular rhythm, normal heart sounds and intact distal pulses.  Pulses:      Radial pulses are 2+ on the right side, and 2+ on the left side.  Hard to palpate pedal pulses however the bilateral feet are warm Right lower extremity: There is pain to palpation to the right calf.  There is pain with dorsiflexion.  There is no erythema or ulceration noted.  There is mild to moderate edema noted to the proximal calf.  Pulmonary/Chest: Effort normal and breath sounds normal.  Musculoskeletal: Normal range of motion. She exhibits edema.  Neurological: She is alert and oriented to person, place, and time.  Skin: Skin is warm and dry. She is not diaphoretic.  Psychiatric: She has a normal mood and affect. Her behavior is normal. Judgment and thought content normal.  Vitals reviewed.  BP (!) 144/95 (BP Location: Right Arm)   Pulse 100   Resp 18   Wt 293 lb 6.4 oz (133.1 kg)   BMI 55.44 kg/m   Past Medical History:  Diagnosis Date  . Asthma   . Bipolar 1 disorder (HCC)   . Edema leg for twenty years per patient   taking lasix for edema  . Fibromyalgia   . Ovarian cyst   . Schizo affective schizophrenia (  HCC)   . Thyroid disease    Social History   Socioeconomic History  . Marital status: Married    Spouse name: Not on file  . Number of children: Not on file  . Years of education: Not on file  . Highest education level: Not on file  Social Needs  . Financial resource strain: Not on file  . Food insecurity - worry: Not on file  . Food insecurity - inability: Not on file  . Transportation needs - medical: Not on file  . Transportation needs - non-medical: Not on file  Occupational History  . Not on file  Tobacco Use  . Smoking status: Never Smoker  . Smokeless tobacco: Never Used  Substance and  Sexual Activity  . Alcohol use: No  . Drug use: No  . Sexual activity: Not on file  Other Topics Concern  . Not on file  Social History Narrative  . Not on file   Past Surgical History:  Procedure Laterality Date  . ABDOMINAL HYSTERECTOMY    . ANKLE SURGERY     pt. states long time ago   Family History  Problem Relation Age of Onset  . Heart failure Mother    Allergies  Allergen Reactions  . Codeine Hives, Itching, Rash and Swelling  . Sulfa Antibiotics Anaphylaxis  . Penicillins Itching  . Aspirin Rash      Assessment & Plan:  Presents as a new patient referred by Dr. Dario GuardianJadali for evaluation of right lower extremity pain and edema.  The patient and endorses trauma to the right lower extremity after a fall over gum balls approximately 1 week ago.  The patient states her mechanism of fall was her right leg fell underneath her.  Since her fall the patient has been experiencing "horrible pain".  She was given tramadol for 2 days and told to follow-up in our office.  An ABI was ordered.  The patient was seen in the ED immediately after the fall taken there by EMS.  X-rays of the right leg yielded no fracture.  The patient states the pain is constant and worsens with walking.  The patient notes that her calf is very swollen.  Patient denies any erythema or ulceration to the right calf.  The patient underwent an ABI which was notable for triphasic tibials and no evidence of significant lower extremity arterial disease.  The bilateral toe brachial indices are normal.  The patient denies any shortness of breath or chest pain.  The patient states there is pain with palpation to her right calf.  The patient denies any fever, nausea or vomiting.  1. Fall, initial encounter - New Patient with recent fall to the right lower extremity Patient seen in the ED immediately after the fall taken there by EMS x-ray of the right lower extremity with no fracture Referring physician order an ABI there is no  lower extremity arterial disease Due to the trauma, pain and edema concerning for possible DVT or hematoma.  We will bring the patient back as soon as possible to undergo a right lower extremity venous duplex to rule out DVT I will place the patient in a right lower extremity Unna boot to help control her edema and provide some relief The patient was told to elevate her leg heart level or higher as much as possible I will give the patient a prescription for tramadol 50 mg 1 tab every 6 hours as needed for pain #60 The patient understands that this will be  the only pain prescription I can give her If the patient experiences any shortness of breath or chest pain she is to go to the emergency room immediately The patient expresses her understanding  - VAS [place] LOWER EXTREMITY VENOUS (DVT); Future  2. Edema extremities - New Encouraged good control as its slows the progression of atherosclerotic disease  - VAS [place] LOWER EXTREMITY VENOUS (DVT); Future  Current Outpatient Medications on File Prior to Visit  Medication Sig Dispense Refill  . albuterol (PROVENTIL HFA;VENTOLIN HFA) 108 (90 Base) MCG/ACT inhaler Inhale 2 puffs into the lungs every 6 (six) hours as needed for wheezing or shortness of breath. 1 Inhaler 2  . ARIPiprazole (ABILIFY) 10 MG tablet Take 10 mg by mouth daily.    . furosemide (LASIX) 20 MG tablet Take 2 tablets (40 mg total) by mouth daily. 30 tablet 0  . haloperidol (HALDOL) 10 MG tablet Take 10 mg by mouth 4 (four) times daily.    Marland Kitchen ibuprofen (ADVIL,MOTRIN) 800 MG tablet Take 1 tablet (800 mg total) by mouth every 8 (eight) hours as needed. 30 tablet 0  . levothyroxine (SYNTHROID, LEVOTHROID) 100 MCG tablet Take 100 mcg by mouth daily before breakfast.    . meloxicam (MOBIC) 7.5 MG tablet Take 7.5 mg by mouth daily.    . naproxen (NAPROSYN) 500 MG tablet Take 1 tablet (500 mg total) by mouth 2 (two) times daily with a meal. 60 tablet 0  . venlafaxine (EFFEXOR) 100 MG tablet  Take 100 mg by mouth 2 (two) times daily.     No current facility-administered medications on file prior to visit.    There are no Patient Instructions on file for this visit. No Follow-up on file.  Katlyne Nishida A Samanthajo Payano, PA-C

## 2017-03-30 ENCOUNTER — Encounter (INDEPENDENT_AMBULATORY_CARE_PROVIDER_SITE_OTHER): Payer: Self-pay | Admitting: Vascular Surgery

## 2017-03-30 ENCOUNTER — Ambulatory Visit (INDEPENDENT_AMBULATORY_CARE_PROVIDER_SITE_OTHER): Payer: Medicaid Other

## 2017-03-30 ENCOUNTER — Ambulatory Visit (INDEPENDENT_AMBULATORY_CARE_PROVIDER_SITE_OTHER): Payer: Medicaid Other | Admitting: Vascular Surgery

## 2017-03-30 VITALS — BP 135/98 | HR 90 | Resp 17 | Wt 288.6 lb

## 2017-03-30 DIAGNOSIS — W19XXXD Unspecified fall, subsequent encounter: Secondary | ICD-10-CM | POA: Diagnosis not present

## 2017-03-30 DIAGNOSIS — R600 Localized edema: Secondary | ICD-10-CM | POA: Diagnosis not present

## 2017-03-30 DIAGNOSIS — W19XXXA Unspecified fall, initial encounter: Secondary | ICD-10-CM

## 2017-03-30 NOTE — Progress Notes (Signed)
Subjective:    Patient ID: Rhonda Palmer Lise Dishman, female    DOB: 10-19-1970, 47 y.o.   MRN: [id number] Chief Complaint  Patient presents with  . Follow-up    rle dvt ultrasound   Patient last seen on Friday for her initial evaluation status post a fall to the right lower extremity.  Patient's ABI has been normal.  [facility] without fracture.  I have brought the patient back today to rule out any deep vein thrombosis due to trauma.  The patient continues to experience right lower extremity pain with and without activity.  The patient denies any ulceration to the right lower extremity.  The patient denies any fever, nausea vomiting.  Right lower extremity venous duplex was notable for no evidence of deep vein thrombosis.  No evidence of superficial venous thrombosis.   Review of Systems  Constitutional: Negative.   HENT: Negative.   Eyes: Negative.   Respiratory: Negative.   Cardiovascular:       Right lower extremity trauma Right lower extremity pain  Gastrointestinal: Negative.   Endocrine: Negative.   Genitourinary: Negative.   Musculoskeletal: Negative.   Skin: Negative.   Allergic/Immunologic: Negative.   Neurological: Negative.   Hematological: Negative.   Psychiatric/Behavioral: Negative.       Objective:   Physical Exam  Constitutional: She is oriented to person, place, and time. She appears well-developed and well-nourished. No distress.  HENT:  Head: Normocephalic and atraumatic.  Eyes: Conjunctivae are normal. Pupils are equal, round, and reactive to light.  Neck: Normal range of motion.  Cardiovascular: Normal rate, regular rhythm, normal heart sounds and intact distal pulses.  Pulses:      Radial pulses are 2+ on the right side, and 2+ on the left side.  Unable to palpate pedal pulses bilaterally due to body habitus  Pulmonary/Chest: Effort normal and breath sounds normal.  Musculoskeletal: Normal range of motion. She  exhibits edema (Mild to moderate right lower extremity edema).  Neurological: She is alert and oriented to person, place, and time.  Skin: Skin is warm and dry. She is not diaphoretic.  Psychiatric: She has a normal mood and affect. Her behavior is normal. Judgment and thought content normal.  Vitals reviewed.  BP (!) 135/98 (BP Location: Right Arm)   Pulse 90   Resp 17   Wt 288 lb 9.6 oz (130.9 kg)   BMI 54.53 kg/m   Past Medical History:  Diagnosis Date  . Asthma   . Bipolar 1 disorder (HCC)   . Edema leg for twenty years per patient   taking lasix for edema  . Fibromyalgia   . Ovarian cyst   . Schizo affective schizophrenia (HCC)   . Thyroid disease    Social History   Socioeconomic History  . Marital status: Married    Spouse name: Not on file  . Number of children: Not on file  . Years of education: Not on file  . Highest education level: Not on file  Social Needs  . Financial resource strain: Not on file  . Food insecurity - worry: Not on file  . Food insecurity - inability: Not on file  . Transportation needs - medical: Not on file  . Transportation needs - non-medical: Not on file  Occupational History  . Not on file  Tobacco Use  . Smoking status: Never Smoker  . Smokeless tobacco: Never Used  Substance and Sexual Activity  . Alcohol use: No  .  Drug use: No  . Sexual activity: Not on file  Other Topics Concern  . Not on file  Social History Narrative  . Not on file   Past Surgical History:  Procedure Laterality Date  . ABDOMINAL HYSTERECTOMY    . ANKLE SURGERY     pt. states long time ago   Family History  Problem Relation Age of Onset  . Heart failure Mother    Allergies  Allergen Reactions  . Codeine Hives, Itching, Rash and Swelling  . Sulfa Antibiotics Anaphylaxis  . Penicillins Itching  . Aspirin Rash     Assessment & Plan:  Patient last seen on Friday for her initial evaluation status post a fall to the right lower extremity.   Patient's ABI has been normal.  X-rays taken in the [facility] ED without fracture.  I have brought the patient back today to rule out any deep vein thrombosis due to trauma.  The patient continues to experience right lower extremity pain with and without activity.  The patient denies any ulceration to the right lower extremity.  The patient denies any fever, nausea vomiting.  Right lower extremity venous duplex was notable for no evidence of deep vein thrombosis.  No evidence of superficial venous thrombosis.  1. Class 3 severe obesity due to excess calories in adult, unspecified BMI, unspecified whether serious comorbidity present (HCC) - Stable Patient's severe obesity status is a contributing factor to her right lower extremity pain after her recent fall.  2. Fall, subsequent encounter - Stable ABI last Friday was notable for no significant lower extremity arterial disease Right lower extremity venous duplex today without deep vein thrombosis or superficial thrombophlebitis Right lower extremity x-rays completed at [facility] without fracture Patient was given tramadol on Friday to help with discomfort Patient encouraged to follow-up with her primary care physician for further care as her discomfort is not stemming from arterial or venous issues. She is to follow-up as needed  Current Outpatient Medications on File Prior to Visit  Medication Sig Dispense Refill  . albuterol (PROVENTIL HFA;VENTOLIN HFA) 108 (90 Base) MCG/ACT inhaler Inhale 2 puffs into the lungs every 6 (six) hours as needed for wheezing or shortness of breath. 1 Inhaler 2  . ARIPiprazole (ABILIFY) 10 MG tablet Take 10 mg by mouth daily.    . furosemide (LASIX) 20 MG tablet Take 2 tablets (40 mg total) by mouth daily. 30 tablet 0  . haloperidol (HALDOL) 10 MG tablet Take 10 mg by mouth 4 (four) times daily.    Marland Kitchen ibuprofen (ADVIL,MOTRIN) 800 MG tablet Take 1 tablet (800 mg total) by  mouth every 8 (eight) hours as needed. 30 tablet 0  . levothyroxine (SYNTHROID, LEVOTHROID) 100 MCG tablet Take 100 mcg by mouth daily before breakfast.    . meloxicam (MOBIC) 7.5 MG tablet Take 7.5 mg by mouth daily.    . naproxen (NAPROSYN) 500 MG tablet Take 1 tablet (500 mg total) by mouth 2 (two) times daily with a meal. 60 tablet 0  . traMADol (ULTRAM) 50 MG tablet One to Two Tabs By Mouth Every Six Hours As Needed For Pain 60 tablet 0  . venlafaxine (EFFEXOR) 100 MG tablet Take 100 mg by mouth 2 (two) times daily.     No current facility-administered medications on file prior to visit.    There are no Patient Instructions on file for this visit. No Follow-up on file.  Blayne Frankie A Amrom Ore, PA-C

## 2017-04-06 ENCOUNTER — Telehealth (INDEPENDENT_AMBULATORY_CARE_PROVIDER_SITE_OTHER): Payer: Self-pay | Admitting: Vascular Surgery

## 2017-04-06 NOTE — Telephone Encounter (Signed)
As I explained to the patient when I first prescribed her the tramadol, I can only give her one prescription as I cannot continue to treat her chronic pain issues.  She has been ruled out for any arterial, venous or DVT issues.  I cannot prescribe a controlled substance if she does not have a vascular issue as we are a vascular surgery practice.  I recommend she follow-up with her primary care physician or if her pain has worsened seek medical attention in the emergency department.

## 2017-04-06 NOTE — Telephone Encounter (Signed)
I spoke with patient and inform her with the instructions KS has advised

## 2017-04-14 ENCOUNTER — Emergency Department
Admission: EM | Admit: 2017-04-14 | Discharge: 2017-04-14 | Disposition: A | Discharge status: Home / Self Care | Payer: Medicaid Other | Attending: Emergency Medicine | Admitting: Emergency Medicine

## 2017-04-14 ENCOUNTER — Emergency Department: Payer: Medicaid Other

## 2017-04-14 ENCOUNTER — Encounter: Payer: Self-pay | Admitting: Emergency Medicine

## 2017-04-14 ENCOUNTER — Other Ambulatory Visit: Payer: Self-pay

## 2017-04-14 DIAGNOSIS — R300 Dysuria: Secondary | ICD-10-CM | POA: Insufficient documentation

## 2017-04-14 DIAGNOSIS — R109 Unspecified abdominal pain: Secondary | ICD-10-CM

## 2017-04-14 DIAGNOSIS — E039 Hypothyroidism, unspecified: Secondary | ICD-10-CM | POA: Diagnosis not present

## 2017-04-14 DIAGNOSIS — J45909 Unspecified asthma, uncomplicated: Secondary | ICD-10-CM | POA: Insufficient documentation

## 2017-04-14 DIAGNOSIS — R1011 Right upper quadrant pain: Secondary | ICD-10-CM | POA: Insufficient documentation

## 2017-04-14 DIAGNOSIS — Z79899 Other long term (current) drug therapy: Secondary | ICD-10-CM | POA: Insufficient documentation

## 2017-04-14 LAB — COMPREHENSIVE METABOLIC PANEL
ALT: 9 U/L — ABNORMAL LOW (ref 14–54)
AST: 17 U/L (ref 15–41)
Albumin: 3.8 g/dL (ref 3.5–5.0)
Alkaline Phosphatase: 114 U/L (ref 38–126)
Anion gap: 8 (ref 5–15)
BILIRUBIN TOTAL: 0.4 mg/dL (ref 0.3–1.2)
BUN: 16 mg/dL (ref 6–20)
CHLORIDE: 108 mmol/L (ref 101–111)
CO2: 23 mmol/L (ref 22–32)
Calcium: 8.9 mg/dL (ref 8.9–10.3)
Creatinine, Ser: 0.65 mg/dL (ref 0.44–1.00)
GFR calc Af Amer: >60 mL/min (ref >60)
GFR calc non Af Amer: >60 mL/min (ref >60)
Glucose, Bld: 123 mg/dL — ABNORMAL HIGH (ref 65–99)
POTASSIUM: 3.7 mmol/L (ref 3.5–5.1)
Sodium: 139 mmol/L (ref 135–145)
Total Protein: 8.2 g/dL — ABNORMAL HIGH (ref 6.5–8.1)

## 2017-04-14 LAB — CBC WITH DIFFERENTIAL/PLATELET
BASOS PCT: 1 %
Basophils Absolute: 0.1 10*3/uL (ref 0–0.1)
Eosinophils Absolute: 0.6 10*3/uL (ref 0–0.7)
Eosinophils Relative: 5 %
HEMATOCRIT: 34.3 % — AB (ref 35.0–47.0)
Hemoglobin: 10.9 g/dL — ABNORMAL LOW (ref 12.0–16.0)
Lymphocytes Relative: 35 %
Lymphs Abs: 3.8 10*3/uL — ABNORMAL HIGH (ref 1.0–3.6)
MCH: 25.4 pg — ABNORMAL LOW (ref 26.0–34.0)
MCHC: 31.7 g/dL — ABNORMAL LOW (ref 32.0–36.0)
MCV: 80.2 fL (ref 80.0–100.0)
MONO ABS: 0.7 10*3/uL (ref 0.2–0.9)
Monocytes Relative: 6 %
NEUTROS ABS: 5.8 10*3/uL (ref 1.4–6.5)
Neutrophils Relative %: 53 %
PLATELETS: 510 10*3/uL — AB (ref 150–440)
RBC: 4.27 MIL/uL (ref 3.80–5.20)
RDW: 17.1 % — AB (ref 11.5–14.5)
WBC: 10.9 10*3/uL (ref 3.6–11.0)

## 2017-04-14 LAB — URINALYSIS, COMPLETE (UACMP) WITH MICROSCOPIC
Bilirubin Urine: NEGATIVE
Glucose, UA: NEGATIVE mg/dL
Hgb urine dipstick: NEGATIVE
Ketones, ur: NEGATIVE mg/dL
Leukocytes, UA: NEGATIVE
Nitrite: NEGATIVE
Protein, ur: NEGATIVE mg/dL
Specific Gravity, Urine: 1.033 — ABNORMAL HIGH (ref 1.005–1.030)
pH: 5 (ref 5.0–8.0)

## 2017-04-14 MED ORDER — ONDANSETRON HCL 4 MG/2ML IJ SOLN
4.0000 mg | Freq: Once | INTRAMUSCULAR | Status: AC
  Administered 2017-04-14: 4 mg via INTRAVENOUS
  Filled 2017-04-14: qty 2

## 2017-04-14 MED ORDER — KETOROLAC TROMETHAMINE 10 MG PO TABS: 10.0000 mg | ORAL_TABLET | Freq: Four times a day (QID) | ORAL | 0 refills | Status: DC | PRN

## 2017-04-14 MED ORDER — OXYCODONE-ACETAMINOPHEN 5-325 MG PO TABS
2.0000 | ORAL_TABLET | Freq: Once | ORAL | Status: AC
  Administered 2017-04-14: 2 via ORAL
  Filled 2017-04-14: qty 2

## 2017-04-14 MED ORDER — SODIUM CHLORIDE 0.9 % IV BOLUS (SEPSIS)
1000.0000 mL | Freq: Once | INTRAVENOUS | Status: AC
Start: 2017-04-14 — End: 2017-04-14
  Administered 2017-04-14: 1000 mL via INTRAVENOUS

## 2017-04-14 MED ORDER — TRAMADOL HCL 50 MG PO TABS: 50.0000 mg | ORAL_TABLET | Freq: Four times a day (QID) | ORAL | 0 refills | Status: DC | PRN

## 2017-04-14 MED ORDER — ONDANSETRON 4 MG PO TBDP: 4.0000 mg | ORAL_TABLET | Freq: Three times a day (TID) | ORAL | 0 refills | Status: DC | PRN

## 2017-04-14 MED ORDER — KETOROLAC TROMETHAMINE 30 MG/ML IJ SOLN
30.0000 mg | Freq: Once | INTRAMUSCULAR | Status: AC
  Administered 2017-04-14: 30 mg via INTRAVENOUS
  Filled 2017-04-14: qty 1

## 2017-04-14 MED ORDER — MORPHINE SULFATE (PF) 4 MG/ML IV SOLN
4.0000 mg | Freq: Once | INTRAVENOUS | Status: AC
  Administered 2017-04-14: 4 mg via INTRAVENOUS
  Filled 2017-04-14: qty 1

## 2017-04-14 NOTE — ED Notes (Signed)
Patient transported to CT at this time. 

## 2017-04-14 NOTE — ED Provider Notes (Signed)
[facility] Emergency Department Provider Note   ____________________________________________   First MD Initiated Contact with Patient 04/14/17 [id number]     (approximate)  I have reviewed the triage vital signs and the nursing notes.   HISTORY  Chief Complaint Flank Pain    HPI Corretta Munce is a 47 y.o. female who comes into the hospital today with some right-sided flank and abdominal pain.  The patient reports that yesterday evening she had some severe right flank pain that moved around to her right abdomen.  The patient reports that she is also had some difficulty urinating and some mild discomfort when she attempts to urinate.  The patient reports that she was diagnosed with a kidney stone in December and it feels exactly the same.  The patient endorses some nausea but no vomiting.  She took some naproxen and ibuprofen at home but it did not help with her pain.  The patient rates her pain currently an 8.5 out of 10 in intensity.  The patient states that she has not seen any blood in her urine.  She has had some urgency.  She is here today for evaluation.  Past Medical History:  Diagnosis Date  . Asthma   . Bipolar 1 disorder (HCC)   . Edema leg for twenty years per patient   taking lasix for edema  . Fibromyalgia   . Ovarian cyst   . Schizo affective schizophrenia (HCC)   . Thyroid disease     Patient Active Problem List   Diagnosis Date Noted  . Fall 03/27/2017  . Long term (current) use of opiate analgesic 06/26/2016  . Long term prescription opiate use 06/26/2016  . Opiate use 06/26/2016  . Obesity 01/17/2016  . History of opioid abuse 01/17/2016  . Asthma 01/17/2016  . Mixed hyperlipidemia 10/09/2015  . Hypokalemia 10/09/2015  . Headache above the eye region 06/16/2013  . Edema extremities 05/18/2013  . 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 GERD (gastroesophageal reflux disease) 04/11/2013  . Personality disorder (HCC)  10/16/2012  . Chronic pain syndrome 06/18/2012  . S/P ankle fusion 05/21/2012  . Schizoaffective disorder, bipolar type (HCC) 02/20/2012  . 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 Hypothyroidism 11/16/2002    Past Surgical History:  Procedure Laterality Date  . ABDOMINAL HYSTERECTOMY    . ANKLE SURGERY     pt. states long time ago    Prior to Admission medications   Medication Sig Start Date End Date Taking? Authorizing Provider  albuterol (PROVENTIL HFA;VENTOLIN HFA) 108 (90 Base) MCG/ACT inhaler Inhale 2 puffs into the lungs every 6 (six) hours as needed for wheezing or shortness of breath. 05/13/16   Nita Sickle, MD  ARIPiprazole (ABILIFY) 10 MG tablet Take 10 mg by mouth daily.    [provider]  furosemide (LASIX) 20 MG tablet Take 2 tablets (40 mg total) by mouth daily. 05/13/16   Nita Sickle, MD  haloperidol (HALDOL) 10 MG tablet Take 10 mg by mouth 4 (four) times daily.    [provider]  ibuprofen (ADVIL,MOTRIN) 800 MG tablet Take 1 tablet (800 mg total) by mouth every 8 (eight) hours as needed. 01/11/17   Governor Rooks, MD  ketorolac (TORADOL) 10 MG tablet Take 1 tablet (10 mg total) by mouth every 6 (six) hours as needed. 04/14/17   Rebecka Apley, MD  levothyroxine (SYNTHROID, LEVOTHROID) 100 MCG tablet Take 100  mcg by mouth daily before breakfast.    [provider]  meloxicam (MOBIC) 7.5 MG tablet Take 7.5 mg by mouth daily.    [provider]  naproxen (NAPROSYN) 500 MG tablet Take 1 tablet (500 mg total) by mouth 2 (two) times daily with a meal. 11/03/16   Lutricia Feil, PA-C  ondansetron (ZOFRAN ODT) 4 MG disintegrating tablet Take 1 tablet (4 mg total) by mouth every 8 (eight) hours as needed for nausea or vomiting. 04/14/17   Rebecka Apley, MD  traMADol Janean Sark) 50 MG tablet One to Two Tabs By Mouth Every Six Hours As  Needed For Pain 03/27/17   Stegmayer, Cala Bradford A, PA-C  traMADol (ULTRAM) 50 MG tablet Take 1 tablet (50 mg total) by mouth every 6 (six) hours as needed. 04/14/17   Rebecka Apley, MD  venlafaxine (EFFEXOR) 100 MG tablet Take 100 mg by mouth 2 (two) times daily.    [provider]    Allergies Codeine; Sulfa antibiotics; Penicillins; and Aspirin  Family History  Problem Relation Age of Onset  . Heart failure Mother     Social History Social History   Tobacco Use  . Smoking status: Never Smoker  . Smokeless tobacco: Never Used  Substance Use Topics  . Alcohol use: No  . Drug use: No    Review of Systems  Constitutional: No fever/chills Eyes: No visual changes. ENT: No sore throat. Cardiovascular: Denies chest pain. Respiratory: Denies shortness of breath. Gastrointestinal:  abdominal pain.  nausea, no vomiting.  No diarrhea.  No constipation. Genitourinary: Negative for dysuria. Musculoskeletal: Right flank pain Skin: Negative for rash. Neurological: Negative for headaches, focal weakness or numbness.   ____________________________________________   PHYSICAL EXAM:  VITAL SIGNS: ED Triage Vitals  Enc Vitals Group     BP 04/14/17 0428 (!) 159/100     Pulse Rate 04/14/17 0428 (!) 106     Resp 04/14/17 0428 18     Temp 04/14/17 0428 98 F (36.7 C)     Temp Source 04/14/17 0428 Oral     SpO2 04/14/17 0428 98 %     Weight 04/14/17 0427 280 lb (127 kg)     Height 04/14/17 0427 5' (1.524 m)     Head Circumference --      Peak Flow --      Pain Score 04/14/17 0427 9     Pain Loc --      Pain Edu? --      Excl. in GC? --     Constitutional: Alert and oriented. Well appearing and in moderate distress. Eyes: Conjunctivae are normal. PERRL. EOMI. Head: Atraumatic. Nose: No congestion/rhinnorhea. Mouth/Throat: Mucous membranes are moist.  Oropharynx non-erythematous. Cardiovascular: Normal rate, regular rhythm. Grossly normal heart sounds.  Good  peripheral circulation. Respiratory: Normal respiratory effort.  No retractions. Lungs CTAB. Gastrointestinal: Soft with some right sided abdominal pain to palpation no distention.  Positive bowel sounds, right CVA tenderness Musculoskeletal: No lower extremity tenderness nor edema.   Neurologic:  Normal speech and language.  Skin:  Skin is warm, dry and intact.  Psychiatric: Mood and affect are normal.   ____________________________________________   LABS (all labs ordered are listed, but only abnormal results are displayed)  Labs Reviewed  URINALYSIS, COMPLETE (UACMP) WITH MICROSCOPIC - Abnormal; Notable for the following components:      Result Value   Color, Urine YELLOW (*)    APPearance CLOUDY (*)    Specific Gravity, Urine 1.033 (*)  Bacteria, UA RARE (*)    Squamous Epithelial / LPF 6-30 (*)    All other components within normal limits  CBC WITH DIFFERENTIAL/PLATELET - Abnormal; Notable for the following components:   Hemoglobin 10.9 (*)    HCT 34.3 (*)    MCH 25.4 (*)    MCHC 31.7 (*)    RDW 17.1 (*)    Platelets 510 (*)    Lymphs Abs 3.8 (*)    All other components within normal limits  COMPREHENSIVE METABOLIC PANEL - Abnormal; Notable for the following components:   Glucose, Bld 123 (*)    Total Protein 8.2 (*)    ALT 9 (*)    All other components within normal limits   ____________________________________________  EKG  none ____________________________________________  RADIOLOGY  ED MD interpretation: CT renal stone study: No acute intra-abdominal process  Official radiology report(s): Ct Renal Stone Study  Result Date: 04/14/2017 CLINICAL DATA:  Right flank pain with increased urinary urgency and frequency. EXAM: CT ABDOMEN AND PELVIS WITHOUT CONTRAST TECHNIQUE: Multidetector CT imaging of the abdomen and pelvis was performed following the standard protocol without IV contrast. COMPARISON:  CT abdomen pelvis dated January 11, 2017. FINDINGS: Lower  chest: No acute abnormality. Hepatobiliary: No focal liver abnormality is seen. Status post cholecystectomy. No biliary dilatation. Pancreas: Unremarkable. No pancreatic ductal dilatation or surrounding inflammatory changes. Spleen: Normal in size without focal abnormality. Adrenals/Urinary Tract: Adrenal glands are unremarkable. Kidneys are normal, without renal calculi, focal lesion, or hydronephrosis. Bladder is unremarkable. Stomach/Bowel: Stomach is within normal limits. Appendix appears normal. No evidence of bowel wall thickening, distention, or inflammatory changes. Vascular/Lymphatic: No significant vascular findings are present. No enlarged abdominal or pelvic lymph nodes. Reproductive: Status post hysterectomy. The there is a 1.6 cm benign simple cyst in the left ovary, unchanged. Other: Tiny fat containing umbilical hernia. No free fluid or pneumoperitoneum. Musculoskeletal: No acute or significant osseous findings. IMPRESSION: 1.  No acute intra-abdominal process. Electronically Signed   By: Obie DredgeWilliam T Derry M.D.   On: 04/14/2017 07:20    ____________________________________________   PROCEDURES  Procedure(s) performed: None  Procedures  Critical Care performed: No  ____________________________________________   INITIAL IMPRESSION / ASSESSMENT AND PLAN / ED COURSE  As part of my medical decision making, I reviewed the following data within the electronic MEDICAL RECORD NUMBER Notes from prior ED visits and [place] Controlled Substance Database   This is a 47 year old female who comes into the hospital today with some right-sided flank pain and a concern for kidney stone.  My differential diagnosis includes kidney stone, urinary tract infection, musculoskeletal pain.  I did check some blood work on the patient to include a CBC and a CMP.  The patient also had a urinalysis which showed some cloudy appearing concentrated urine but rare bacteria and 6-30 rbcs  I did give the patient a dose  of morphine Zofran and a liter of normal saline.  I will give the patient a shot of Toradol as well.  Her CT scan came back without showing any kidney stones.  I will have the patient follow-up with urology for further evaluation of this pain.  She will be discharged to home.      ____________________________________________   FINAL CLINICAL IMPRESSION(S) / ED DIAGNOSES  Final diagnoses:  Flank pain     ED Discharge Orders        Ordered    ketorolac (TORADOL) 10 MG tablet  Every 6 hours PRN     04/14/17 [id number]  traMADol (ULTRAM) 50 MG tablet  Every 6 hours PRN     04/14/17 0822    ondansetron (ZOFRAN ODT) 4 MG disintegrating tablet  Every 8 hours PRN     04/14/17 1610       Note:  This document was prepared using Dragon voice recognition software and may include unintentional dictation errors.    Rebecka Apley, MD 04/14/17 [phone]

## 2017-04-14 NOTE — ED Notes (Signed)

## 2017-04-14 NOTE — ED Notes (Signed)
Pt reports RLQ abdominal pain with radiation into the RIGHT flank. Pt reports previous h/x of kidney stones and states presenting s/x's are similar.

## 2017-04-14 NOTE — ED Triage Notes (Addendum)
Pt to triage via w/c with no distress noted, brought in by EMS; pt reports right flank pain radiating into abd since yesterday accomp by nausea, urgency & frequency

## 2017-04-14 NOTE — Discharge Instructions (Signed)
Please follow up with urology for further evaluation of your flank pain

## 2017-04-20 ENCOUNTER — Emergency Department
Admission: EM | Admit: 2017-04-20 | Discharge: 2017-04-20 | Disposition: A | Discharge status: Home / Self Care | Payer: Medicaid Other | Attending: Emergency Medicine | Admitting: Emergency Medicine

## 2017-04-20 ENCOUNTER — Ambulatory Visit
Admission: EM | Admit: 2017-04-20 | Discharge: 2017-04-20 | Disposition: A | Discharge status: Home / Self Care | Payer: Medicaid Other | Attending: Family Medicine | Admitting: Family Medicine

## 2017-04-20 ENCOUNTER — Encounter: Payer: Self-pay | Admitting: *Deleted

## 2017-04-20 ENCOUNTER — Other Ambulatory Visit: Payer: Self-pay

## 2017-04-20 DIAGNOSIS — J45909 Unspecified asthma, uncomplicated: Secondary | ICD-10-CM | POA: Insufficient documentation

## 2017-04-20 DIAGNOSIS — F250 Schizoaffective disorder, bipolar type: Secondary | ICD-10-CM | POA: Diagnosis not present

## 2017-04-20 DIAGNOSIS — M797 Fibromyalgia: Secondary | ICD-10-CM | POA: Diagnosis not present

## 2017-04-20 DIAGNOSIS — G894 Chronic pain syndrome: Secondary | ICD-10-CM | POA: Insufficient documentation

## 2017-04-20 DIAGNOSIS — E782 Mixed hyperlipidemia: Secondary | ICD-10-CM | POA: Insufficient documentation

## 2017-04-20 DIAGNOSIS — F319 Bipolar disorder, unspecified: Secondary | ICD-10-CM | POA: Diagnosis not present

## 2017-04-20 DIAGNOSIS — R109 Unspecified abdominal pain: Secondary | ICD-10-CM

## 2017-04-20 DIAGNOSIS — Z79899 Other long term (current) drug therapy: Secondary | ICD-10-CM | POA: Diagnosis not present

## 2017-04-20 DIAGNOSIS — E669 Obesity, unspecified: Secondary | ICD-10-CM | POA: Insufficient documentation

## 2017-04-20 DIAGNOSIS — K219 Gastro-esophageal reflux disease without esophagitis: Secondary | ICD-10-CM | POA: Insufficient documentation

## 2017-04-20 DIAGNOSIS — R110 Nausea: Secondary | ICD-10-CM | POA: Insufficient documentation

## 2017-04-20 DIAGNOSIS — Z79890 Hormone replacement therapy (postmenopausal): Secondary | ICD-10-CM | POA: Diagnosis not present

## 2017-04-20 DIAGNOSIS — E039 Hypothyroidism, unspecified: Secondary | ICD-10-CM | POA: Diagnosis not present

## 2017-04-20 LAB — URINALYSIS, COMPLETE (UACMP) WITH MICROSCOPIC
Bilirubin Urine: NEGATIVE
GLUCOSE, UA: NEGATIVE mg/dL
Ketones, ur: NEGATIVE mg/dL
Leukocytes, UA: NEGATIVE
Nitrite: NEGATIVE
Protein, ur: NEGATIVE mg/dL
SPECIFIC GRAVITY, URINE: 1.015 (ref 1.005–1.030)
pH: 6 (ref 5.0–8.0)

## 2017-04-20 LAB — COMPREHENSIVE METABOLIC PANEL
ALK PHOS: 112 U/L (ref 38–126)
ALT: 11 U/L — AB (ref 14–54)
AST: 16 U/L (ref 15–41)
Albumin: 3.8 g/dL (ref 3.5–5.0)
Anion gap: 10 (ref 5–15)
BUN: 17 mg/dL (ref 6–20)
CO2: 26 mmol/L (ref 22–32)
CREATININE: 0.66 mg/dL (ref 0.44–1.00)
Calcium: 9.2 mg/dL (ref 8.9–10.3)
Chloride: 101 mmol/L (ref 101–111)
Glucose, Bld: 115 mg/dL — ABNORMAL HIGH (ref 65–99)
Potassium: 4.5 mmol/L (ref 3.5–5.1)
Sodium: 137 mmol/L (ref 135–145)
Total Bilirubin: 0.4 mg/dL (ref 0.3–1.2)
Total Protein: 8.3 g/dL — ABNORMAL HIGH (ref 6.5–8.1)

## 2017-04-20 LAB — CBC
HCT: 34.6 % — ABNORMAL LOW (ref 35.0–47.0)
Hemoglobin: 11.1 g/dL — ABNORMAL LOW (ref 12.0–16.0)
MCH: 25.5 pg — AB (ref 26.0–34.0)
MCHC: 32.1 g/dL (ref 32.0–36.0)
MCV: 79.6 fL — ABNORMAL LOW (ref 80.0–100.0)
PLATELETS: 414 10*3/uL (ref 150–440)
RBC: 4.35 MIL/uL (ref 3.80–5.20)
RDW: 17.1 % — ABNORMAL HIGH (ref 11.5–14.5)
WBC: 10.4 10*3/uL (ref 3.6–11.0)

## 2017-04-20 MED ORDER — TRAMADOL HCL 50 MG PO TABS
ORAL_TABLET | ORAL | Status: AC
  Filled 2017-04-20: qty 2

## 2017-04-20 MED ORDER — TRAMADOL HCL 50 MG PO TABS
100.0000 mg | ORAL_TABLET | Freq: Once | ORAL | Status: AC
  Administered 2017-04-20: 100 mg via ORAL

## 2017-04-20 MED ORDER — KETOROLAC TROMETHAMINE 60 MG/2ML IM SOLN
30.0000 mg | Freq: Once | INTRAMUSCULAR | Status: AC
  Administered 2017-04-20: 30 mg via INTRAMUSCULAR

## 2017-04-20 NOTE — ED Triage Notes (Signed)
Pt reports right flank pain, radiates around to the right lower abdomen for about 1 week. Pt states she was seen here for possible kidney stone, referred to urology for the same. Pt says she has been using her meds (toradol, ultram and zofran) without relief.

## 2017-04-20 NOTE — ED Provider Notes (Signed)
[facility] Emergency Department Provider Note  Time seen: 10:57 PM  I have reviewed the triage vital signs and the nursing notes.   HISTORY  Chief Complaint Flank Pain    HPI Rhonda Palmer is a 47 y.o. female with a past medical history of asthma, bipolar, fibromyalgia, schizoaffective, presents to the emergency department for right flank pain.  According to the patient for the past 1 week she has been expensing right flank pain.  Patient was seen in the emergency department 6 days ago for the same had a CT scan that showed normal results and largely normal lab work.  Patient went to urgent care earlier today and was told if the pain worsens to go to the emergency department.  Patient states the pain did not relieve so she came to the emergency department.  She denies any dysuria or hematuria.  States moderate to severe sharp pains in her right flank.  Occasional nausea but denies vomiting.  Denies diarrhea.  Denies fever.   Past Medical History:  Diagnosis Date  . Asthma   . Bipolar 1 disorder (HCC)   . Edema leg for twenty years per patient   taking lasix for edema  . Fibromyalgia   . Ovarian cyst   . Schizo affective schizophrenia (HCC)   . Thyroid disease     Patient Active Problem List   Diagnosis Date Noted  . Fall 03/27/2017  . Long term (current) use of opiate analgesic 06/26/2016  . Long term prescription opiate use 06/26/2016  . Opiate use 06/26/2016  . Obesity 01/17/2016  . History of opioid abuse 01/17/2016  . Asthma 01/17/2016  . Mixed hyperlipidemia 10/09/2015  . Hypokalemia 10/09/2015  . Headache above the eye region 06/16/2013  . Edema extremities 05/18/2013  . 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 GERD (gastroesophageal reflux disease) 04/11/2013  . Personality disorder (HCC) 10/16/2012  . Chronic pain syndrome 06/18/2012  . S/P ankle fusion 05/21/2012  . Schizoaffective disorder, bipolar type (HCC) 02/20/2012   . 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 Hypothyroidism 11/16/2002    Past Surgical History:  Procedure Laterality Date  . ABDOMINAL HYSTERECTOMY    . ANKLE SURGERY     pt. states long time ago    Prior to Admission medications   Medication Sig Start Date End Date Taking? Authorizing Provider  ARIPiprazole (ABILIFY) 10 MG tablet Take 10 mg by mouth daily.    [provider]  furosemide (LASIX) 20 MG tablet Take 2 tablets (40 mg total) by mouth daily. 05/13/16   Nita Sickle, MD  ibuprofen (ADVIL,MOTRIN) 800 MG tablet Take 1 tablet (800 mg total) by mouth every 8 (eight) hours as needed. 01/11/17   Governor Rooks, MD  ketorolac (TORADOL) 10 MG tablet Take 1 tablet (10 mg total) by mouth every 6 (six) hours as needed. 04/14/17   Rebecka Apley, MD  levothyroxine (SYNTHROID, LEVOTHROID) 100 MCG tablet Take 100 mcg by mouth daily before breakfast.    [provider]  mirtazapine (REMERON) 30 MG tablet TAKE 1 TABLET BY MOUTH AT BEDTIME AS NEEDED FOR SLEEP 02/13/16   [provider]  naproxen (NAPROSYN) 500 MG tablet Take 1 tablet (500 mg total) by mouth 2 (two) times daily with a meal. 11/03/16   Lutricia Feil, PA-C  ondansetron (ZOFRAN ODT) 4 MG disintegrating tablet Take 1 tablet (4 mg total) by mouth every 8 (  eight) hours as needed for nausea or vomiting. 04/14/17   Rebecka ApleyWebster, Allison P, MD  venlafaxine (EFFEXOR) 100 MG tablet Take 100 mg by mouth 2 (two) times daily.    [provider]    Allergies  Allergen Reactions  . Codeine Hives, Itching, Rash and Swelling  . Sulfa Antibiotics Anaphylaxis  . Penicillins Itching  . Aspirin Rash    Family History  Problem Relation Age of Onset  . Heart failure Mother     Social History Social History   Tobacco Use  . Smoking status: Never Smoker  . Smokeless tobacco: Never Used  Substance Use Topics   . Alcohol use: No  . Drug use: No    Review of Systems Constitutional: Negative for fever. Eyes: Negative for visual complaints ENT: Negative for recent illness/congestion Cardiovascular: Negative for chest pain. Respiratory: Negative for shortness of breath. Gastrointestinal: Sharp right flank pain, intermittent.  Positive for nausea.  Negative for vomiting or diarrhea Genitourinary: No dysuria or hematuria. Musculoskeletal: Negative for musculoskeletal complaints Skin: Negative for skin complaints  Neurological: Negative for headache All other ROS negative  ____________________________________________   PHYSICAL EXAM:  VITAL SIGNS: ED Triage Vitals [04/20/17 2043]  Enc Vitals Group     BP (!) 143/96     Pulse Rate 96     Resp 18     Temp 99.2 F (37.3 C)     Temp Source Oral     SpO2 100 %     Weight 280 lb (127 kg)     Height 5\' 1"  (1.549 m)     Head Circumference      Peak Flow      Pain Score 8     Pain Loc      Pain Edu?      Excl. in GC?     Constitutional: Alert and oriented. Well appearing and in no distress. Eyes: Normal exam ENT   Head: Normocephalic and atraumatic.   Mouth/Throat: Mucous membranes are moist. Cardiovascular: Normal rate, regular rhythm. No murmur Respiratory: Normal respiratory effort without tachypnea nor retractions. Breath sounds are clear  Gastrointestinal: Soft and nontender. No distention.  There is no CVA tenderness. Musculoskeletal: Nontender with normal range of motion in all extremities. Neurologic:  Normal speech and language. No gross focal neurologic deficits Skin:  Skin is warm, dry and intact.  Psychiatric: Mood and affect are normal.   ____________________________________________   INITIAL IMPRESSION / ASSESSMENT AND PLAN / ED COURSE  Pertinent labs & imaging results that were available during my care of the patient were reviewed by me and considered in my medical decision making (see chart for  details).  Patient presents emergency department for right flank pain times 1 week.  Differential could include ureterolithiasis, appendicitis, gallbladder disease, urinary tract infection or pyelonephritis.  I reviewed the patient's workup including her urgent care visit from earlier today and ER visit from earlier this week.  CT scan was read as negative.  I reviewed the CT imaging myself and did not see any signs of ureterolithiasis or any other concerning findings on the CT scan.  Patient's lab work in the emergency department is normal including normal kidney function.  Normal white blood cell count.  I reviewed the patient's urinalysis from urgent care that did show 6-30 RBCs.  I also reviewed the note from urgent care in which they state the patient has had multiple controlled prescriptions filled and is currently prescribed tramadol at home.  Patient received Toradol by injection  at urgent care.  I does the patient one-time dose of tramadol in the emergency department.  After reviewing the patient's narcotic database I discussed with patient that I would not be prescribing her any controlled substances.  I offered repeat CT imaging or CT imaging with contrast to further evaluate, patient declines at this time.  States she has follow-up with urology on Friday would rather wait until then.  Patient will be discharged from the emergency department.  Again I discussed that would not be refilling any narcotic medication for the patient, she is agreeable.  I did discuss return precautions for any worsening pain or development of fever.  ____________________________________________   FINAL CLINICAL IMPRESSION(S) / ED DIAGNOSES  Right flank pain    Minna Antis, MD 04/20/17 2301

## 2017-04-20 NOTE — ED Provider Notes (Signed)
[facility]    CSN: [id number] Arrival date & time: 04/20/17  1826     History   Chief Complaint Chief Complaint  Patient presents with  . Flank Pain    HPI Rhonda Palmer is a 47 y.o. female.   HPI  47 year old female presents with right flank pain rated an 8 out of 10.  No nausea or vomiting.  She states the pain radiates from her right flank into her right groin.  Review of medical records she was seen [facility] emergency department on 04/14/2017 with the similar complaints.  At that time CT scan did not show any evidence of stone.  She had received morphine intramuscularly as well as oxycodone and Toradol at the time of discharge.  She also had a intramuscular injection of Toradol. 3 days later she was seen again and prescribed a prescription of Toradol another prescriber.  I searched the [place] substance abuse registry found that she has had numerous prescriptions for tramadol and hydrocodone from different pharmacies.  Today her urine did not show any red blood cells on the spun sample and a trace on dipstick.  He does not appear to be in any great deal of pain today.           Past Medical History:  Diagnosis Date  . Asthma   . Bipolar 1 disorder (HCC)   . Edema leg for twenty years per patient   taking lasix for edema  . Fibromyalgia   . Ovarian cyst   . Schizo affective schizophrenia (HCC)   . Thyroid disease     Patient Active Problem List   Diagnosis Date Noted  . Fall 03/27/2017  . Long term (current) use of opiate analgesic 06/26/2016  . Long term prescription opiate use 06/26/2016  . Opiate use 06/26/2016  . Obesity 01/17/2016  . History of opioid abuse 01/17/2016  . Asthma 01/17/2016  . Mixed hyperlipidemia 10/09/2015  . Hypokalemia 10/09/2015  . Headache above the eye region 06/16/2013  . Edema extremities 05/18/2013  . 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 GERD (gastroesophageal reflux disease) 04/11/2013  .  Personality disorder (HCC) 10/16/2012  . Chronic pain syndrome 06/18/2012  . S/P ankle fusion 05/21/2012  . Schizoaffective disorder, bipolar type (HCC) 02/20/2012  . 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 Hypothyroidism 11/16/2002    Past Surgical History:  Procedure Laterality Date  . ABDOMINAL HYSTERECTOMY    . ANKLE SURGERY     pt. states long time ago    OB History    Gravida  3   Para  2   Term  2   Preterm      AB      Living  2     SAB      TAB      Ectopic      Multiple      Live Births               Home Medications    Prior to Admission medications   Medication Sig Start Date End Date Taking? Authorizing Provider  mirtazapine (REMERON) 30 MG tablet TAKE 1 TABLET BY MOUTH AT BEDTIME AS NEEDED FOR SLEEP 02/13/16  Yes [provider]  ARIPiprazole (ABILIFY) 10 MG tablet Take 10 mg by mouth daily.    [provider]  furosemide (LASIX) 20 MG tablet Take 2 tablets (40 mg total) by  mouth daily. 05/13/16   Nita Sickle, MD  ibuprofen (ADVIL,MOTRIN) 800 MG tablet Take 1 tablet (800 mg total) by mouth every 8 (eight) hours as needed. 01/11/17   Governor Rooks, MD  ketorolac (TORADOL) 10 MG tablet Take 1 tablet (10 mg total) by mouth every 6 (six) hours as needed. 04/14/17   Rebecka Apley, MD  levothyroxine (SYNTHROID, LEVOTHROID) 100 MCG tablet Take 100 mcg by mouth daily before breakfast.    [provider]  naproxen (NAPROSYN) 500 MG tablet Take 1 tablet (500 mg total) by mouth 2 (two) times daily with a meal. 11/03/16   Lutricia Feil, PA-C  ondansetron (ZOFRAN ODT) 4 MG disintegrating tablet Take 1 tablet (4 mg total) by mouth every 8 (eight) hours as needed for nausea or vomiting. 04/14/17   Rebecka Apley, MD  venlafaxine (EFFEXOR) 100 MG tablet Take 100 mg by mouth 2 (two) times daily.    [provider]     Family History Family History  Problem Relation Age of Onset  . Heart failure Mother     Social History Social History   Tobacco Use  . Smoking status: Never Smoker  . Smokeless tobacco: Never Used  Substance Use Topics  . Alcohol use: No  . Drug use: No     Allergies   Codeine; Sulfa antibiotics; Penicillins; and Aspirin   Review of Systems Review of Systems   Physical Exam Triage Vital Signs ED Triage Vitals  Enc Vitals Group     BP 04/20/17 1836 (!) 137/95     Pulse Rate 04/20/17 1836 (!) 102     Resp 04/20/17 1836 20     Temp 04/20/17 1836 98.5 F (36.9 C)     Temp Source 04/20/17 1836 Oral     SpO2 04/20/17 1836 99 %     Weight 04/20/17 1838 288 lb (130.6 kg)     Height 04/20/17 1838 5' (1.524 m)     Head Circumference --      Peak Flow --      Pain Score 04/20/17 1836 8     Pain Loc --      Pain Edu? --      Excl. in GC? --    No data found.  Updated Vital Signs BP (!) 137/95 (BP Location: Right Arm)   Pulse (!) 102   Temp 98.5 F (36.9 C) (Oral)   Resp 20   Ht 5' (1.524 m)   Wt 288 lb (130.6 kg)   SpO2 99%   BMI 56.25 kg/m   Visual Acuity Right Eye Distance:   Left Eye Distance:   Bilateral Distance:    Right Eye Near:   Left Eye Near:    Bilateral Near:     Physical Exam   UC Treatments / Results  Labs (all labs ordered are listed, but only abnormal results are displayed) Labs Reviewed  URINALYSIS, COMPLETE (UACMP) WITH MICROSCOPIC - Abnormal; Notable for the following components:      Result Value   APPearance HAZY (*)    Hgb urine dipstick TRACE (*)    Squamous Epithelial / LPF 6-30 (*)    Bacteria, UA RARE (*)    All other components within normal limits    EKG None Radiology No results found.  Procedures Procedures (including critical care time)  Medications Ordered in UC Medications  ketorolac (TORADOL) injection 30 mg (30 mg Intramuscular Given 04/20/17 1945)     Initial Impression / Assessment and  Plan /  UC Course  I have reviewed the triage vital signs and the nursing notes.  Pertinent labs & imaging results that were available during my care of the patient were reviewed by me and considered in my medical decision making (see chart for details).     Plan: 1. Test/x-ray results and diagnosis reviewed with patient 2. rx as per orders; risks, benefits, potential side effects reviewed with patient 3. Recommend supportive treatment with duration and screening her urine.  Advised her to go the emergency room if she gets more pain since I do not have anything to offer here.   based on the findings of the [place] substance abuse registry that I would not provide her with any narcotics would have to obtain further narcotics from the emergency room.  She states that she does have an appointment with urologist on Friday but they directed her here because of her pain today.  Do not have substantial findings to indicate a kidney stone today. 4. F/u prn if symptoms worsen or don't improve   Final Clinical Impressions(s) / UC Diagnoses   Final diagnoses:  Right flank pain    ED Discharge Orders    None       Controlled Substance Prescriptions [place] Controlled Substance Registry consulted? Substance abuse registry showed a overdose risk score of 570.  Numerous prescriptions given for narcotics mostly tramadol from numerous prescribers as well as numerous pharmacies.  Did not receive any narcotic medications from this office.   Lutricia FeilRoemer, Vaanya Shambaugh P, PA-C 04/20/17 2047

## 2017-04-20 NOTE — ED Triage Notes (Signed)
Pt with right flank pain and was seen in the ER at [facility] a week ago for same. Has f/u with urology on Friday.  Pain 8/10

## 2017-04-21 ENCOUNTER — Telehealth: Payer: Self-pay

## 2017-04-21 NOTE — Telephone Encounter (Signed)
Pt has called multiple times demanding to be seen sooner than Friday as a new patient. Reinforced with pt there are no new patient appointments any sooner than Friday. Reinforced with pt to seek tx at the ER if pain is unbearable as [facility] can not provide any information until she is an established pt. Pt became upset and hung up.

## 2017-04-22 LAB — URINE CULTURE

## 2017-04-24 ENCOUNTER — Ambulatory Visit: Payer: Medicaid Other | Admitting: Urology

## 2017-04-27 ENCOUNTER — Ambulatory Visit: Payer: Self-pay | Admitting: Urology

## 2017-04-28 NOTE — Progress Notes (Signed)
04/29/2017 9:37 AM   Rhonda Palmer 1971/01/05 [id number]  Referring provider: Sherrie Mustache, MD No address on file  Chief Complaint  Patient presents with  . Nephrolithiasis    HPI: Patient is a 47 year old Caucasian female who is referred by [facility] ED for nephrolithiasis.  She presented to the ED on 04/14/2017 for right-sided flank pain that radiated to the abdomen.  Serum creatinine was 0.65.  Her WBC count was 10.9.  Her UA was positive for 6-30 RBC's and 0-5 WBC's.  CT Renal stone study was negative for stones.  She then returned back to the ED on 04/20/2017 for similar complaints.  Serum creatinine was 0.66.  Her WBC count was 10.4.  Her UA was positive for 0-5 RBC's and 0-5 WBC's.  Her urine culture was positive for multiple species.    Today, she is experiencing right flank pain that is radiating to the right waist.  She describes the pain as sharp and jabbing.  7-8/10 pain.  Urinating makes it worse.  Nothing helps the pain.    She had an episode of gross hematuria.  She is experiencing frequency, nocturia x 3 and intermittency, but these as baseline symptoms   Her CATH UA was positive for 0-5 WBC's and 0-10 epithelial cells.  Patient denies any dysuria or suprapubic/flank pain.  Patient denies any fevers, chills, nausea or vomiting.   She is not a smoker.  She is not exposed to second hand smoke.  She has not worked with chemicals.  She is HTN and has a high BMI.    PMH: Past Medical History:  Diagnosis Date  . Asthma   . Bipolar 1 disorder (HCC)   . Edema leg for twenty years per patient   taking lasix for edema  . Fibromyalgia   . Ovarian cyst   . Schizo affective schizophrenia (HCC)   . Thyroid disease     Surgical History: Past Surgical History:  Procedure Laterality Date  . ABDOMINAL HYSTERECTOMY    . ANKLE SURGERY     pt. states long time ago    Home Medications:  Allergies as of 04/29/2017      Reactions   Codeine Hives, Itching, Rash, Swelling   Sulfa Antibiotics Anaphylaxis   Penicillins Itching   Aspirin Rash      Medication List        Accurate as of 04/29/17  9:37 AM. Always use your most recent med list.          ARIPiprazole 10 MG tablet Commonly known as:  ABILIFY Take 10 mg by mouth daily.   furosemide 20 MG tablet Commonly known as:  LASIX Take 2 tablets (40 mg total) by mouth daily.   ibuprofen 800 MG tablet Commonly known as:  ADVIL,MOTRIN Take 1 tablet (800 mg total) by mouth every 8 (eight) hours as needed.   ketorolac 10 MG tablet Commonly known as:  TORADOL Take 1 tablet (10 mg total) by mouth every 6 (six) hours as needed.   levothyroxine 100 MCG tablet Commonly known as:  SYNTHROID, LEVOTHROID Take 100 mcg by mouth daily before breakfast.   mirtazapine 30 MG tablet Commonly known as:  REMERON TAKE 1 TABLET BY MOUTH AT BEDTIME AS NEEDED FOR SLEEP   naproxen 500 MG tablet Commonly known as:  NAPROSYN Take 1 tablet (500 mg total) by mouth 2 (two) times daily with a meal.   ondansetron 4 MG disintegrating tablet Commonly known as:  ZOFRAN ODT Take 1  tablet (4 mg total) by mouth every 8 (eight) hours as needed for nausea or vomiting.   venlafaxine 100 MG tablet Commonly known as:  EFFEXOR Take 100 mg by mouth 2 (two) times daily.       Allergies:  Allergies  Allergen Reactions  . Codeine Hives, Itching, Rash and Swelling  . Sulfa Antibiotics Anaphylaxis  . Penicillins Itching  . Aspirin Rash    Family History: Family History  Problem Relation Age of Onset  . Heart failure Mother   . Bladder Cancer Neg Hx   . Kidney cancer Neg Hx     Social History:  reports that she has never smoked. She has never used smokeless tobacco. She reports that she does not drink alcohol or use drugs.  ROS: UROLOGY Frequent Urination?: Yes Hard to postpone urination?: No Burning/pain with urination?: No Get up at night to urinate?: Yes Leakage of urine?: No Urine stream starts and stops?:  No Trouble starting stream?: Yes Do you have to strain to urinate?: No Blood in urine?: Yes Urinary tract infection?: No Sexually transmitted disease?: No Injury to kidneys or bladder?: No Painful intercourse?: No Weak stream?: No Currently pregnant?: No Vaginal bleeding?: No Last menstrual period?: n  Gastrointestinal Nausea?: Yes Vomiting?: No Indigestion/heartburn?: No Diarrhea?: No Constipation?: No  Constitutional Fever: No Night sweats?: No Weight loss?: No Fatigue?: No  Skin Skin rash/lesions?: No Itching?: No  Eyes Blurred vision?: No Double vision?: No  Ears/Nose/Throat Sore throat?: No Sinus problems?: No  Hematologic/Lymphatic Swollen glands?: No Easy bruising?: No  Cardiovascular Leg swelling?: No Chest pain?: No  Respiratory Cough?: No Shortness of breath?: No  Endocrine Excessive thirst?: No  Musculoskeletal Back pain?: No Joint pain?: No  Neurological Headaches?: No Dizziness?: No  Psychologic Depression?: No Anxiety?: No  Physical Exam: BP (!) 139/93 (BP Location: Right Arm, Patient Position: Sitting, Cuff Size: Large)   Pulse (!) 114   Ht 5\' 1"  (1.549 m)   Wt 290 lb 8 oz (131.8 kg)   BMI 54.89 kg/m   Constitutional: Well nourished. Alert and oriented, No acute distress. HEENT: [place] AT, moist mucus membranes. Trachea midline, no masses. Cardiovascular: No clubbing, cyanosis, or edema. Respiratory: Normal respiratory effort, no increased work of breathing. GI: Abdomen is soft, non tender, non distended, no abdominal masses. Liver and spleen not palpable.  No hernias appreciated.  Stool sample for occult testing is not indicated.   GU: No CVA tenderness.  No bladder fullness or masses.   Skin: No rashes, bruises or suspicious lesions. Lymph: No cervical or inguinal adenopathy. Neurologic: Grossly intact, no focal deficits, moving all 4 extremities. Psychiatric: Normal mood and affect.  Laboratory Data: Lab Results   Component Value Date   WBC 10.4 04/20/2017   HGB 11.1 (L) 04/20/2017   HCT 34.6 (L) 04/20/2017   MCV 79.6 (L) 04/20/2017   PLT 414 04/20/2017    Lab Results  Component Value Date   CREATININE 0.66 04/20/2017    No results found for: PSA  No results found for: TESTOSTERONE  No results found for: HGBA1C  No results found for: TSH  No results found for: CHOL, HDL, CHOLHDL, VLDL, LDLCALC  Lab Results  Component Value Date   AST 16 04/20/2017   Lab Results  Component Value Date   ALT 11 (L) 04/20/2017   No components found for: ALKALINEPHOPHATASE No components found for: BILIRUBINTOTAL  No results found for: ESTRADIOL  Urinalysis 0-5 WBC's.  0-10 Epithelial cells.  See Epic.  I have reviewed the labs.   Pertinent Imaging: CLINICAL DATA:  Right flank pain with increased urinary urgency and frequency.  EXAM: CT ABDOMEN AND PELVIS WITHOUT CONTRAST  TECHNIQUE: Multidetector CT imaging of the abdomen and pelvis was performed following the standard protocol without IV contrast.  COMPARISON:  CT abdomen pelvis dated January 11, 2017.  FINDINGS: Lower chest: No acute abnormality.  Hepatobiliary: No focal liver abnormality is seen. Status post cholecystectomy. No biliary dilatation.  Pancreas: Unremarkable. No pancreatic ductal dilatation or surrounding inflammatory changes.  Spleen: Normal in size without focal abnormality.  Adrenals/Urinary Tract: Adrenal glands are unremarkable. Kidneys are normal, without renal calculi, focal lesion, or hydronephrosis. Bladder is unremarkable.  Stomach/Bowel: Stomach is within normal limits. Appendix appears normal. No evidence of bowel wall thickening, distention, or inflammatory changes.  Vascular/Lymphatic: No significant vascular findings are present. No enlarged abdominal or pelvic lymph nodes.  Reproductive: Status post hysterectomy. The there is a 1.6 cm benign simple cyst in the left ovary,  unchanged.  Other: Tiny fat containing umbilical hernia. No free fluid or pneumoperitoneum.  Musculoskeletal: No acute or significant osseous findings.  IMPRESSION: 1.  No acute intra-abdominal process.   Electronically Signed   By: Obie Dredge M.D.   On: 04/14/2017 07:20  I have independently reviewed the films.    Assessment & Plan:    1. Gross hematuria  - I explained to the patient that there are a number of causes that can be associated with blood in the urine, such as stones, UTI's, damage to the urinary tract and/or cancer.  - At this time, I felt that the patient warranted further urologic evaluation.   The AUA guidelines state that a CT urogram is the preferred imaging study to evaluate hematuria.  - I explained to the patient that a contrast material will be injected into a vein and that in rare instances, an allergic reaction can result and may even life threatening   The patient denies any allergies to contrast, iodine and/or seafood and is not taking metformin  - Her reproductive status is s/p hysterectomy  - Following the imaging study,  I've recommended a cystoscopy. I described how this is performed, typically in an office setting with a flexible cystoscope. We described the risks, benefits, and possible side effects, the most common of which is a minor amount of blood in the urine and/or burning which usually resolves in 24 to 48 hours.    - The patient had the opportunity to ask questions which were answered. Based upon this discussion, the patient is willing to proceed. Therefore, I've ordered: a CT Urogram and cystoscopy.  - The patient will return following all of the above for discussion of the results.   - UA  - Urine culture  - BUN + creatinine    2. Right flank pain Non contrast study did not demonstrate any stones or hydronephrosis See above  Return for CT Urogram report and cystoscopy.  These notes generated with voice recognition software. I  apologize for typographical errors.  Michiel Cowboy, PA-C  [facility] [address] [place], [place] [address] [phone]

## 2017-04-29 ENCOUNTER — Encounter: Payer: Self-pay | Admitting: Urology

## 2017-04-29 ENCOUNTER — Ambulatory Visit (INDEPENDENT_AMBULATORY_CARE_PROVIDER_SITE_OTHER): Payer: Medicaid Other | Admitting: Urology

## 2017-04-29 VITALS — BP 139/93 | HR 114 | Ht 61.0 in | Wt 290.5 lb

## 2017-04-29 DIAGNOSIS — R109 Unspecified abdominal pain: Secondary | ICD-10-CM | POA: Diagnosis not present

## 2017-04-29 DIAGNOSIS — R3129 Other microscopic hematuria: Secondary | ICD-10-CM

## 2017-04-29 DIAGNOSIS — R310 Gross hematuria: Secondary | ICD-10-CM | POA: Diagnosis not present

## 2017-04-29 LAB — URINALYSIS, COMPLETE
Bilirubin, UA: NEGATIVE
GLUCOSE, UA: NEGATIVE
Ketones, UA: NEGATIVE
LEUKOCYTES UA: NEGATIVE
Nitrite, UA: NEGATIVE
RBC, UA: NEGATIVE
Specific Gravity, UA: 1.015 (ref 1.005–1.030)
Urobilinogen, Ur: 1 mg/dL (ref 0.2–1.0)
pH, UA: 8.5 — ABNORMAL HIGH (ref 5.0–7.5)

## 2017-04-29 LAB — MICROSCOPIC EXAMINATION
Bacteria, UA: NONE SEEN
RBC, UA: NONE SEEN /hpf (ref 0–2)

## 2017-04-29 NOTE — Progress Notes (Signed)
In and Out Catheterization  Patient is present today for a I & O catheterization due to hematuria. Patient was cleaned and prepped in a sterile fashion with betadine and Lidocaine 2% jelly was instilled into the urethra.  A 14FR cath was inserted no complications were noted , 60ml of urine return was noted, urine was yellow in color. A clean urine sample was collected for UA, UCX. Bladder was drained  And catheter was removed with out difficulty.    Preformed by: Teressa Lowerarrie Bill Mcvey, CMA

## 2017-04-29 NOTE — Patient Instructions (Signed)
Hematuria, Adult Hematuria is blood in your urine. It can be caused by a bladder infection, kidney infection, prostate infection, kidney stone, or cancer of your urinary tract. Infections can usually be treated with medicine, and a kidney stone usually will pass through your urine. If neither of these is the cause of your hematuria, further workup to find out the reason may be needed. It is very important that you tell your health care provider about any blood you see in your urine, even if the blood stops without treatment or happens without causing pain. Blood in your urine that happens and then stops and then happens again can be a symptom of a very serious condition. Also, pain is not a symptom in the initial stages of many urinary cancers. Follow these instructions at home:  Drink lots of fluid, 3-4 quarts a day. If you have been diagnosed with an infection, cranberry juice is especially recommended, in addition to large amounts of water.  Avoid caffeine, tea, and carbonated beverages because they tend to irritate the bladder.  Avoid alcohol because it may irritate the prostate.  Take all medicines as directed by your health care provider.  If you were prescribed an antibiotic medicine, finish it all even if you start to feel better.  If you have been diagnosed with a kidney stone, follow your health care provider's instructions regarding straining your urine to catch the stone.  Empty your bladder often. Avoid holding urine for long periods of time.  After a bowel movement, women should cleanse front to back. Use each tissue only once.  Empty your bladder before and after sexual intercourse if you are a female. Contact a health care provider if:  You develop back pain.  You have a fever.  You have a feeling of sickness in your stomach (nausea) or vomiting.  Your symptoms are not better in 3 days. Return sooner if you are getting worse. Get help right away if:  You develop  severe vomiting and are unable to keep the medicine down.  You develop severe back or abdominal pain despite taking your medicines.  You begin passing a large amount of blood or clots in your urine.  You feel extremely weak or faint, or you pass out. This information is not intended to replace advice given to you by your health care provider. Make sure you discuss any questions you have with your health care provider. Document Released: 01/13/2005 Document Revised: 06/21/2015 Document Reviewed: 09/13/2012 Elsevier Interactive Patient Education  2017 [employer]  CT Scan A CT scan (computed tomography scan) is an imaging scan. It uses X-rays and a computer to make detailed pictures of different areas inside the body. A CT scan can give more information than a regular X-ray exam. A CT scan provides data about internal organs, soft tissue structures, blood vessels, and bones. In this procedure, the pictures will be taken in a large machine that has an opening (CT scanner). Tell a health care provider about:  Any allergies you have.  All medicines you are taking, including vitamins, herbs, eye drops, creams, and over-the-counter medicines.  Any blood disorders you have.  Any surgeries you have had.  Any medical conditions you have.  Whether you are pregnant or may be pregnant. What are the risks? Generally, this is a safe procedure. However, problems may occur, including:  An allergic reaction to dyes.  Development of cancer from excessive exposure to radiation from multiple CT scans. This is rare.  What happens before   the procedure? Staying hydrated Follow instructions from your health care provider about hydration, which may include:  Up to 2 hours before the procedure - you may continue to drink clear liquids, such as water, clear fruit juice, black coffee, and plain tea.  Eating and drinking restrictions Follow instructions from your health care provider about eating and  drinking, which may include:  24 hours before the procedure - stop drinking caffeinated beverages, such as energy drinks, tea, soda, coffee, and hot chocolate.  8 hours before the procedure - stop eating heavy meals or foods such as meat, fried foods, or fatty foods.  6 hours before the procedure - stop eating light meals or foods, such as toast or cereal.  6 hours before the procedure - stop drinking milk or drinks that contain milk.  2 hours before the procedure - stop drinking clear liquids.  General instructions  Remove any jewelry.  Ask your health care provider about changing or stopping your regular medicines. This is especially important if you are taking diabetes medicines or blood thinners. What happens during the procedure?  You will lie on a table with your arms above your head.  An IV tube may be inserted into one of your veins.  The contrast dye may be injected into the IV tube. You may feel warm or have a metallic taste in your mouth.  The table you will be lying on will move into the CT scanner.  You will be able to see, hear, and talk to the person running the machine while you are in it. Follow that person's instructions.  The CT scanner will move around you to take pictures. Do not move while it is scanning. Staying still helps the scanner to get a good image.  When the best possible pictures have been taken, the machine will be turned off. The table will be moved out of the machine.  The IV tube will be removed. The procedure may vary among health care providers and hospitals. What happens after the procedure?  It is up to you to get the results of your procedure. Ask your health care provider, or the department that is doing the procedure, when your results will be ready. Summary  A CT scan is an imaging scan.  A CT scan uses X-rays and a computer to make detailed pictures of different areas of your body.  Follow instructions from your health care  provider about eating and drinking before the procedure.  You will be able to see, hear, and talk to the person running the machine while you are in it. Follow that person's instructions. This information is not intended to replace advice given to you by your health care provider. Make sure you discuss any questions you have with your health care provider. Document Released: 02/21/2004 Document Revised: 02/16/2016 Document Reviewed: 02/16/2016 Elsevier Interactive Patient Education  2018 [employer].  Cystoscopy Cystoscopy is a procedure that is used to help diagnose and sometimes treat conditions that affect that lower urinary tract. The lower urinary tract includes the bladder and the tube that drains urine from the bladder out of the body (urethra). Cystoscopy is performed with a thin, tube-shaped instrument with a light and camera at the end (cystoscope). The cystoscope may be hard (rigid) or flexible, depending on the goal of the procedure.The cystoscope is inserted through the urethra, into the bladder. Cystoscopy may be recommended if you have:  Urinary tractinfections that keep coming back (recurring).  Blood in the urine (hematuria).    Loss of bladder control (urinary incontinence) or an overactive bladder.  Unusual cells found in a urine sample.  A blockage in the urethra.  Painful urination.  An abnormality in the bladder found during an intravenous pyelogram (IVP) or CT scan.  Cystoscopy may also be done to remove a sample of tissue to be examined under a microscope (biopsy). Tell a health care provider about:  Any allergies you have.  All medicines you are taking, including vitamins, herbs, eye drops, creams, and over-the-counter medicines.  Any problems you or family members have had with anesthetic medicines.  Any blood disorders you have.  Any surgeries you have had.  Any medical conditions you have.  Whether you are pregnant or may be pregnant. What are the  risks? Generally, this is a safe procedure. However, problems may occur, including:  Infection.  Bleeding.  Allergic reactions to medicines.  Damage to other structures or organs.  What happens before the procedure?  Ask your health care provider about: ? Changing or stopping your regular medicines. This is especially important if you are taking diabetes medicines or blood thinners. ? Taking medicines such as aspirin and ibuprofen. These medicines can thin your blood. Do not take these medicines before your procedure if your health care provider instructs you not to.  Follow instructions from your health care provider about eating or drinking restrictions.  You may be given antibiotic medicine to help prevent infection.  You may have an exam or testing, such as X-rays of the bladder, urethra, or kidneys.  You may have urine tests to check for signs of infection.  Plan to have someone take you home after the procedure. What happens during the procedure?  To reduce your risk of infection,your health care team will wash or sanitize their hands.  You will be given one or more of the following: ? A medicine to help you relax (sedative). ? A medicine to numb the area (local anesthetic).  The area around the opening of your urethra will be cleaned.  The cystoscope will be passed through your urethra into your bladder.  Germ-free (sterile)fluid will flow through the cystoscope to fill your bladder. The fluid will stretch your bladder so that your surgeon can clearly examine your bladder walls.  The cystoscope will be removed and your bladder will be emptied. The procedure may vary among health care providers and hospitals. What happens after the procedure?  You may have some soreness or pain in your abdomen and urethra. Medicines will be available to help you.  You may have some blood in your urine.  Do not drive for 24 hours if you received a sedative. This information is  not intended to replace advice given to you by your health care provider. Make sure you discuss any questions you have with your health care provider. Document Released: 01/11/2000 Document Revised: 05/24/2015 Document Reviewed: 11/30/2014 Elsevier Interactive Patient Education  2018 Elsevier Inc.  

## 2017-04-30 LAB — BUN+CREAT
BUN/Creatinine Ratio: 11 (ref 9–23)
BUN: 6 mg/dL (ref 6–24)
CREATININE: 0.56 mg/dL — AB (ref 0.57–1.00)
GFR calc Af Amer: 128 mL/min/{1.73_m2} (ref >59)
GFR, EST NON AFRICAN AMERICAN: 111 mL/min/{1.73_m2} (ref >59)

## 2017-05-01 LAB — CULTURE, URINE COMPREHENSIVE

## 2017-05-11 ENCOUNTER — Telehealth: Payer: Self-pay | Admitting: Urology

## 2017-05-11 ENCOUNTER — Other Ambulatory Visit: Payer: Self-pay | Admitting: Urology

## 2017-05-11 DIAGNOSIS — R310 Gross hematuria: Secondary | ICD-10-CM

## 2017-05-11 NOTE — Progress Notes (Signed)
CTU orders are in.   

## 2017-05-11 NOTE — Telephone Encounter (Signed)
Carollee HerterShannon the CT urogram never got ordered for this patient. Can you put the order in please?   Thanks, Marcelino DusterMichelle

## 2017-05-27 ENCOUNTER — Encounter: Payer: Self-pay | Admitting: Urology

## 2017-05-27 ENCOUNTER — Other Ambulatory Visit: Payer: Medicaid Other | Admitting: Urology

## 2017-06-10 ENCOUNTER — Encounter: Payer: Self-pay | Admitting: Emergency Medicine

## 2017-06-10 ENCOUNTER — Emergency Department: Payer: Medicaid Other

## 2017-06-10 ENCOUNTER — Emergency Department
Admission: EM | Admit: 2017-06-10 | Discharge: 2017-06-10 | Disposition: A | Discharge status: Home / Self Care | Payer: Medicaid Other | Attending: Emergency Medicine | Admitting: Emergency Medicine

## 2017-06-10 ENCOUNTER — Other Ambulatory Visit: Payer: Self-pay

## 2017-06-10 DIAGNOSIS — Y999 Unspecified external cause status: Secondary | ICD-10-CM | POA: Diagnosis not present

## 2017-06-10 DIAGNOSIS — Y92002 Bathroom of unspecified non-institutional (private) residence single-family (private) house as the place of occurrence of the external cause: Secondary | ICD-10-CM | POA: Insufficient documentation

## 2017-06-10 DIAGNOSIS — W228XXA Striking against or struck by other objects, initial encounter: Secondary | ICD-10-CM | POA: Diagnosis not present

## 2017-06-10 DIAGNOSIS — Y93E1 Activity, personal bathing and showering: Secondary | ICD-10-CM | POA: Diagnosis not present

## 2017-06-10 DIAGNOSIS — E039 Hypothyroidism, unspecified: Secondary | ICD-10-CM | POA: Diagnosis not present

## 2017-06-10 DIAGNOSIS — Z79899 Other long term (current) drug therapy: Secondary | ICD-10-CM | POA: Insufficient documentation

## 2017-06-10 DIAGNOSIS — S76211A Strain of adductor muscle, fascia and tendon of right thigh, initial encounter: Secondary | ICD-10-CM | POA: Diagnosis not present

## 2017-06-10 DIAGNOSIS — J45909 Unspecified asthma, uncomplicated: Secondary | ICD-10-CM | POA: Diagnosis not present

## 2017-06-10 DIAGNOSIS — S79921A Unspecified injury of right thigh, initial encounter: Secondary | ICD-10-CM | POA: Diagnosis present

## 2017-06-10 MED ORDER — TRAMADOL HCL 50 MG PO TABS: 25.0000 mg | ORAL_TABLET | Freq: Four times a day (QID) | ORAL | 0 refills | Status: AC | PRN

## 2017-06-10 MED ORDER — CYCLOBENZAPRINE HCL 10 MG PO TABS
5.0000 mg | ORAL_TABLET | Freq: Once | ORAL | Status: AC
  Administered 2017-06-10: 5 mg via ORAL
  Filled 2017-06-10: qty 1

## 2017-06-10 MED ORDER — IBUPROFEN 600 MG PO TABS: 600.0000 mg | ORAL_TABLET | Freq: Four times a day (QID) | ORAL | 0 refills | Status: DC | PRN

## 2017-06-10 MED ORDER — TRAMADOL HCL 50 MG PO TABS
50.0000 mg | ORAL_TABLET | Freq: Once | ORAL | Status: AC
  Administered 2017-06-10: 50 mg via ORAL
  Filled 2017-06-10: qty 1

## 2017-06-10 MED ORDER — MELOXICAM 7.5 MG PO TABS
7.5000 mg | ORAL_TABLET | Freq: Once | ORAL | Status: AC
  Administered 2017-06-10: 7.5 mg via ORAL
  Filled 2017-06-10: qty 1

## 2017-06-10 MED ORDER — CYCLOBENZAPRINE HCL 5 MG PO TABS: ORAL_TABLET | ORAL | 0 refills | Status: DC

## 2017-06-10 MED ORDER — TRAMADOL HCL 50 MG PO TABS: 50.0000 mg | ORAL_TABLET | Freq: Once | ORAL | Status: DC

## 2017-06-10 MED ORDER — LIDOCAINE 5 % EX PTCH: 1.0000 | MEDICATED_PATCH | Freq: Two times a day (BID) | CUTANEOUS | 0 refills | Status: DC

## 2017-06-10 MED ORDER — LIDOCAINE 5 % EX PTCH
1.0000 | MEDICATED_PATCH | CUTANEOUS | Status: DC
  Administered 2017-06-10: 1 via TRANSDERMAL
  Filled 2017-06-10: qty 1

## 2017-06-10 NOTE — ED Triage Notes (Signed)
Patient states she fell this AM trying to step out of tub shower and fell onto side of tub.  Complaining of pain right side of groin.

## 2017-06-10 NOTE — ED Notes (Signed)
First Nurse Note:  Patient states she fell in shower this AM and now has pain in right groin area.

## 2017-06-10 NOTE — ED Provider Notes (Signed)
[facility] Emergency Department Provider Note  ____________________________________________  Time seen: Approximately 10:24 AM  I have reviewed the triage vital signs and the nursing notes.   HISTORY  Chief Complaint Groin Injury    HPI Rhonda Palmer is a 47 y.o. female that presents to the emergency department for evaluation of right groin pain after injury this morning.  Patient was getting out of the tub when she slipped and landed on the right side of her groin.  She has been walking but with pain.  It does not feel weak.  She is having trouble lifting her leg due to pain.  She is concerned that she injured a muscle.  She would like an x-ray.  No additional injuries.  No numbness, tingling.  Past Medical History:  Diagnosis Date  . Asthma   . Bipolar 1 disorder (HCC)   . Edema leg for twenty years per patient   taking lasix for edema  . Fibromyalgia   . Ovarian cyst   . Schizo affective schizophrenia (HCC)   . Thyroid disease     Patient Active Problem List   Diagnosis Date Noted  . Fall 03/27/2017  . Long term (current) use of opiate analgesic 06/26/2016  . Long term prescription opiate use 06/26/2016  . Opiate use 06/26/2016  . Obesity 01/17/2016  . History of opioid abuse 01/17/2016  . Asthma 01/17/2016  . Mixed hyperlipidemia 10/09/2015  . Hypokalemia 10/09/2015  . Headache above the eye region 06/16/2013  . Edema extremities 05/18/2013  . 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 GERD (gastroesophageal reflux disease) 04/11/2013  . Personality disorder (HCC) 10/16/2012  . Chronic pain syndrome 06/18/2012  . S/P ankle fusion 05/21/2012  . Schizoaffective disorder, bipolar type (HCC) 02/20/2012  . 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 Hypothyroidism 11/16/2002    Past Surgical History:  Procedure Laterality Date  .  ABDOMINAL HYSTERECTOMY    . ANKLE SURGERY     pt. states long time ago    Prior to Admission medications   Medication Sig Start Date End Date Taking? Authorizing Provider  ARIPiprazole (ABILIFY) 10 MG tablet Take 10 mg by mouth daily.    [provider]  cyclobenzaprine (FLEXERIL) 5 MG tablet Take 1-2 tablets 3 times daily as needed 06/10/17   Enid Derry, PA-C  furosemide (LASIX) 20 MG tablet Take 2 tablets (40 mg total) by mouth daily. 05/13/16   Nita Sickle, MD  ibuprofen (ADVIL,MOTRIN) 600 MG tablet Take 1 tablet (600 mg total) by mouth every 6 (six) hours as needed. 06/10/17   Enid Derry, PA-C  ketorolac (TORADOL) 10 MG tablet Take 1 tablet (10 mg total) by mouth every 6 (six) hours as needed. 04/14/17   Rebecka Apley, MD  levothyroxine (SYNTHROID, LEVOTHROID) 100 MCG tablet Take 100 mcg by mouth daily before breakfast.    [provider]  lidocaine (LIDODERM) 5 % Place 1 patch onto the skin every 12 (twelve) hours. Remove & Discard patch within 12 hours or as directed by MD 06/10/17 06/10/18  Enid Derry, PA-C  mirtazapine (REMERON) 30 MG tablet TAKE 1 TABLET BY MOUTH AT BEDTIME AS NEEDED FOR SLEEP 02/13/16   [provider]  naproxen (NAPROSYN) 500 MG tablet Take 1 tablet (500 mg total) by mouth 2 (two) times daily with a meal. 11/03/16   Lutricia Feil, PA-C  ondansetron (ZOFRAN ODT) 4 MG disintegrating tablet  Take 1 tablet (4 mg total) by mouth every 8 (eight) hours as needed for nausea or vomiting. 04/14/17   Rebecka Apley, MD  traMADol (ULTRAM) 50 MG tablet Take 0.5 tablets (25 mg total) by mouth every 6 (six) hours as needed. 06/10/17 06/10/18  Enid Derry, PA-C  venlafaxine (EFFEXOR) 100 MG tablet Take 100 mg by mouth 2 (two) times daily.    [provider]    Allergies Codeine; Sulfa antibiotics; Penicillins; and Aspirin  Family History  Problem Relation Age of Onset  . Heart failure Mother   . Bladder Cancer Neg Hx    . Kidney cancer Neg Hx     Social History Social History   Tobacco Use  . Smoking status: Never Smoker  . Smokeless tobacco: Never Used  Substance Use Topics  . Alcohol use: No  . Drug use: No     Review of Systems  Cardiovascular: No chest pain. Respiratory: No SOB. Gastrointestinal: No abdominal pain.  No nausea, no vomiting.  Musculoskeletal: Positive for groin pain.  Skin: Negative for rash, abrasions, lacerations, ecchymosis. Neurological: Negative for headaches, numbness or tingling   ____________________________________________   PHYSICAL EXAM:  VITAL SIGNS: ED Triage Vitals  Enc Vitals Group     BP 06/10/17 0921 107/69     Pulse Rate 06/10/17 0921 92     Resp 06/10/17 0921 16     Temp 06/10/17 0921 98.3 F (36.8 C)     Temp Source 06/10/17 0921 Oral     SpO2 06/10/17 0921 92 %     Weight 06/10/17 0922 290 lb (131.5 kg)     Height 06/10/17 0922 5' (1.524 m)     Head Circumference --      Peak Flow --      Pain Score 06/10/17 0921 8     Pain Loc --      Pain Edu? --      Excl. in GC? --      Constitutional: Alert and oriented. Well appearing and in no acute distress. Eyes: Conjunctivae are normal. PERRL. EOMI. Head: Atraumatic. ENT:      Ears:      Nose: No congestion/rhinnorhea.      Mouth/Throat: Mucous membranes are moist.  Neck: No stridor.  Cardiovascular: Normal rate, regular rhythm.  Good peripheral circulation. Respiratory: Normal respiratory effort without tachypnea or retractions. Lungs CTAB. Good air entry to the bases with no decreased or absent breath sounds. Musculoskeletal: Full range of motion to all extremities. No gross deformities appreciated.  Strength equal in lower extremities bilaterally.  Normal gait. Neurologic:  Normal speech and language. No gross focal neurologic deficits are appreciated.  Skin:  Skin is warm, dry and intact. No rash noted. Psychiatric: Mood and affect are normal. Speech and behavior are normal.  Patient exhibits appropriate insight and judgement.   ____________________________________________   LABS (all labs ordered are listed, but only abnormal results are displayed)  Labs Reviewed - No data to display ____________________________________________  EKG   ____________________________________________  RADIOLOGY Lexine Baton, personally viewed and evaluated these images (plain radiographs) as part of my medical decision making, as well as reviewing the written report by the radiologist.  Dg Hip Unilat W Or Wo Pelvis 2-3 Views Right  Result Date: 06/10/2017 CLINICAL DATA:  Acute RIGHT hip pain following recent fall. Initial encounter. EXAM: DG HIP (WITH OR WITHOUT PELVIS) 2-3V RIGHT COMPARISON:  10/06/2016 radiographs FINDINGS: There is no evidence of hip fracture or dislocation. There is no  evidence of arthropathy or other focal bone abnormality. IMPRESSION: Negative. Electronically Signed   By: Harmon Pier M.D.   On: 06/10/2017 10:50    ____________________________________________    PROCEDURES  Procedure(s) performed:    Procedures    Medications  lidocaine (LIDODERM) 5 % 1 patch (1 patch Transdermal Patch Applied 06/10/17 1101)  cyclobenzaprine (FLEXERIL) tablet 5 mg (5 mg Oral Given 06/10/17 1100)  meloxicam (MOBIC) tablet 7.5 mg (7.5 mg Oral Given 06/10/17 1100)  traMADol (ULTRAM) tablet 50 mg (50 mg Oral Given 06/10/17 1154)     ____________________________________________   INITIAL IMPRESSION / ASSESSMENT AND PLAN / ED COURSE  Pertinent labs & imaging results that were available during my care of the patient were reviewed by me and considered in my medical decision making (see chart for details).  Review of the [place] CSRS was performed in accordance of the [facility] prior to dispensing any controlled drugs.  Patient presented to the emergency department for evaluation of groin injury.  Vital signs and exam are reassuring.  X-ray negative for acute bony  abnormalities.  Patient will be discharged home with prescriptions for tramadol, Lidoderm, ibuprofen, flexeril. Patient is to follow up with PCP as directed. Patient is given ED precautions to return to the ED for any worsening or new symptoms.     ____________________________________________  FINAL CLINICAL IMPRESSION(S) / ED DIAGNOSES  Final diagnoses:  Groin strain, right, initial encounter      NEW MEDICATIONS STARTED DURING THIS VISIT:  ED Discharge Orders        Ordered    traMADol (ULTRAM) 50 MG tablet  Every 6 hours PRN     06/10/17 1127    ibuprofen (ADVIL,MOTRIN) 600 MG tablet  Every 6 hours PRN     06/10/17 1127    cyclobenzaprine (FLEXERIL) 5 MG tablet     06/10/17 1127    lidocaine (LIDODERM) 5 %  Every 12 hours     06/10/17 1127          This chart was dictated using voice recognition software/Dragon. Despite best efforts to proofread, errors can occur which can change the meaning. Any change was purely unintentional.    Enid Derry, PA-C 06/10/17 1500    Emily Filbert, MD 06/10/17 [phone]

## 2017-06-10 NOTE — ED Notes (Signed)
Pt ambulatory to POV without difficulty. VSS. NAD. Discahrge instructions, RX, follow up discussed.

## 2017-06-10 NOTE — ED Notes (Signed)
See triage note  States she slipped while getting out of tub  States she did a split  Having pain to groin area and into right hip area  Ambulates well to treatment room

## 2017-07-28 ENCOUNTER — Emergency Department
Admission: EM | Admit: 2017-07-28 | Discharge: 2017-07-28 | Disposition: A | Discharge status: Home / Self Care | Payer: Medicaid Other | Attending: Emergency Medicine | Admitting: Emergency Medicine

## 2017-07-28 ENCOUNTER — Encounter: Payer: Self-pay | Admitting: Emergency Medicine

## 2017-07-28 ENCOUNTER — Emergency Department: Payer: Medicaid Other

## 2017-07-28 ENCOUNTER — Other Ambulatory Visit: Payer: Self-pay

## 2017-07-28 DIAGNOSIS — E039 Hypothyroidism, unspecified: Secondary | ICD-10-CM | POA: Insufficient documentation

## 2017-07-28 DIAGNOSIS — Z79899 Other long term (current) drug therapy: Secondary | ICD-10-CM | POA: Insufficient documentation

## 2017-07-28 DIAGNOSIS — M25552 Pain in left hip: Secondary | ICD-10-CM | POA: Diagnosis not present

## 2017-07-28 DIAGNOSIS — J45909 Unspecified asthma, uncomplicated: Secondary | ICD-10-CM | POA: Insufficient documentation

## 2017-07-28 MED ORDER — LIDOCAINE 5 % EX PTCH
1.0000 | MEDICATED_PATCH | CUTANEOUS | Status: DC
  Administered 2017-07-28: 1 via TRANSDERMAL
  Filled 2017-07-28: qty 1

## 2017-07-28 MED ORDER — MELOXICAM 7.5 MG PO TABS: 7.5000 mg | ORAL_TABLET | Freq: Every day | ORAL | 2 refills | Status: AC

## 2017-07-28 MED ORDER — KETOROLAC TROMETHAMINE 30 MG/ML IJ SOLN
30.0000 mg | Freq: Once | INTRAMUSCULAR | Status: AC
  Administered 2017-07-28: 30 mg via INTRAMUSCULAR
  Filled 2017-07-28: qty 1

## 2017-07-28 MED ORDER — LIDOCAINE 5 % EX PTCH: 1.0000 | MEDICATED_PATCH | Freq: Two times a day (BID) | CUTANEOUS | 0 refills | Status: DC

## 2017-07-28 NOTE — ED Notes (Signed)
FIRST NURSE NOTE:  Pt c/o left hip pain, pt states she is able to walk on it, but causes pain. States when she isn't walking there is no pain. Pt states the pain has been there 5 days. Pt states she fell about 1 week ago, but states she did not hit her hip.

## 2017-07-28 NOTE — ED Notes (Addendum)
See triage note  Presents with left hip pain states she had a fall on her tailbone about a month  Ago.  Was seen by her ortho MD  But states now she is having pain to left hip   Feels like it is "out of socket"  States she is able to walk but having pain

## 2017-07-28 NOTE — ED Triage Notes (Signed)
Pain to left hip for 5 days.  7/10 pain when walking 5/10 pain when sitting

## 2017-07-28 NOTE — ED Provider Notes (Signed)
[facility] Emergency Department Provider Note  ____________________________________________  Time seen: Approximately 10:39 AM  I have reviewed the triage vital signs and the nursing notes.   HISTORY  Chief Complaint Hip Pain    HPI Rhonda Palmer is a 47 y.o. female that presents emergency department for evaluation of left hip pain for 1 week.  Patient states that she fell trying to get on a horse 1 week ago and landed on her  buttocks.  Pain over her left hip has been consistent for the last week.  It feels like it is "out of socket." Pain does not radiate.  Pain is worse with walking.  It improves with sitting and rest.  No fever, chills, urinary symptoms.  Past Medical History:  Diagnosis Date  . Asthma   . Bipolar 1 disorder (HCC)   . Edema leg for twenty years per patient   taking lasix for edema  . Fibromyalgia   . Ovarian cyst   . Schizo affective schizophrenia (HCC)   . Thyroid disease     Patient Active Problem List   Diagnosis Date Noted  . Fall 03/27/2017  . Long term (current) use of opiate analgesic 06/26/2016  . Long term prescription opiate use 06/26/2016  . Opiate use 06/26/2016  . Obesity 01/17/2016  . History of opioid abuse 01/17/2016  . Asthma 01/17/2016  . Mixed hyperlipidemia 10/09/2015  . Hypokalemia 10/09/2015  . Headache above the eye region 06/16/2013  . Edema extremities 05/18/2013  . 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 GERD (gastroesophageal reflux disease) 04/11/2013  . Personality disorder (HCC) 10/16/2012  . Chronic pain syndrome 06/18/2012  . S/P ankle fusion 05/21/2012  . Schizoaffective disorder, bipolar type (HCC) 02/20/2012  . 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 Hypothyroidism 11/16/2002    Past Surgical History:  Procedure Laterality Date  . ABDOMINAL HYSTERECTOMY    . ANKLE SURGERY      pt. states long time ago    Prior to Admission medications   Medication Sig Start Date End Date Taking? Authorizing Provider  ARIPiprazole (ABILIFY) 10 MG tablet Take 10 mg by mouth daily.    [provider]  cyclobenzaprine (FLEXERIL) 5 MG tablet Take 1-2 tablets 3 times daily as needed 06/10/17   Enid Derry, PA-C  furosemide (LASIX) 20 MG tablet Take 2 tablets (40 mg total) by mouth daily. 05/13/16   Nita Sickle, MD  ibuprofen (ADVIL,MOTRIN) 600 MG tablet Take 1 tablet (600 mg total) by mouth every 6 (six) hours as needed. 06/10/17   Enid Derry, PA-C  ketorolac (TORADOL) 10 MG tablet Take 1 tablet (10 mg total) by mouth every 6 (six) hours as needed. 04/14/17   Rebecka Apley, MD  levothyroxine (SYNTHROID, LEVOTHROID) 100 MCG tablet Take 100 mcg by mouth daily before breakfast.    [provider]  lidocaine (LIDODERM) 5 % Place 1 patch onto the skin every 12 (twelve) hours. Remove & Discard patch within 12 hours or as directed by MD 07/28/17 07/28/18  Enid Derry, PA-C  meloxicam (MOBIC) 7.5 MG tablet Take 1 tablet (7.5 mg total) by mouth daily for 10 days. 07/28/17 08/07/17  Enid Derry, PA-C  mirtazapine (REMERON) 30 MG tablet TAKE 1 TABLET BY MOUTH AT BEDTIME AS NEEDED FOR SLEEP 02/13/16   [provider]  naproxen (NAPROSYN) 500 MG tablet Take 1 tablet (500 mg total) by mouth 2 (two) times  daily with a meal. 11/03/16   Ovid Curdoemer, William P, PA-C  ondansetron (ZOFRAN ODT) 4 MG disintegrating tablet Take 1 tablet (4 mg total) by mouth every 8 (eight) hours as needed for nausea or vomiting. 04/14/17   Rebecka ApleyWebster, Allison P, MD  traMADol (ULTRAM) 50 MG tablet Take 0.5 tablets (25 mg total) by mouth every 6 (six) hours as needed. 06/10/17 06/10/18  Enid DerryWagner, Tzvi Economou, PA-C  venlafaxine (EFFEXOR) 100 MG tablet Take 100 mg by mouth 2 (two) times daily.    [provider]    Allergies Codeine; Sulfa antibiotics; Penicillins; and Aspirin  Family History   Problem Relation Age of Onset  . Heart failure Mother   . Bladder Cancer Neg Hx   . Kidney cancer Neg Hx     Social History Social History   Tobacco Use  . Smoking status: Never Smoker  . Smokeless tobacco: Never Used  Substance Use Topics  . Alcohol use: No  . Drug use: No     Review of Systems  Constitutional: No fever/chills Gastrointestinal: No abdominal pain.  No nausea, no vomiting.  Musculoskeletal: Positive for hip pain. Negative for back pain.  Genitourinary: Negative for dysuria, urgency, frequency, hematuria.  Skin: Negative for rash, abrasions, lacerations, ecchymosis. Neurological: Negative for headaches, numbness or tingling   ____________________________________________   PHYSICAL EXAM:  VITAL SIGNS: ED Triage Vitals  Enc Vitals Group     BP 07/28/17 0937 (!) 156/104     Pulse Rate 07/28/17 0937 89     Resp 07/28/17 0937 18     Temp 07/28/17 0937 98.2 F (36.8 C)     Temp Source 07/28/17 0937 Oral     SpO2 07/28/17 0937 99 %     Weight 07/28/17 1004 290 lb (131.5 kg)     Height 07/28/17 1004 5\' 4"  (1.626 m)     Head Circumference --      Peak Flow --      Pain Score 07/28/17 0933 7     Pain Loc --      Pain Edu? --      Excl. in GC? --      Constitutional: Alert and oriented. Well appearing and in no acute distress. Eyes: Conjunctivae are normal. PERRL. EOMI. Head: Atraumatic. ENT:      Ears:      Nose: No congestion/rhinnorhea.      Mouth/Throat: Mucous membranes are moist.  Neck: No stridor.  Cardiovascular: Normal rate, regular rhythm.  Good peripheral circulation. Respiratory: Normal respiratory effort without tachypnea or retractions. Lungs CTAB. Good air entry to the bases with no decreased or absent breath sounds. Gastrointestinal: Bowel sounds 4 quadrants. Soft and nontender to palpation. No guarding or rigidity. No palpable masses. No distention. No CVA tenderness. Musculoskeletal: Full range of motion to all extremities. No  gross deformities appreciated.  Tenderness to palpation over iliac crest.  Full range of motion of hip.  Normal gait. Neurologic:  Normal speech and language. No gross focal neurologic deficits are appreciated.  Skin:  Skin is warm, dry and intact. No rash noted. Psychiatric: Mood and affect are normal. Speech and behavior are normal. Patient exhibits appropriate insight and judgement.   ____________________________________________   LABS (all labs ordered are listed, but only abnormal results are displayed)  Labs Reviewed - No data to display ____________________________________________  EKG   ____________________________________________  RADIOLOGY Lexine BatonI, Zhuri Krass, personally viewed and evaluated these images (plain radiographs) as part of my medical decision making, as well as reviewing the  written report by the radiologist.  Dg Hip Unilat With Pelvis 2-3 Views Left  Result Date: 07/28/2017 CLINICAL DATA:  Left hip pain. Painful when walking but no symptoms when not walking. Duration of symptoms 5 days. The patient reports falling 1 week ago but did not hit her hip to her knowledge. EXAM: DG HIP (WITH OR WITHOUT PELVIS) 2-3V LEFT COMPARISON:  AP pelvis of May 29th 2019. FINDINGS: The bony pelvis is subjectively adequately mineralized. There is no acute or healing pelvic fracture. There is no lytic or blastic bony lesion. AP and lateral views of the left hip reveal preservation of the joint space. The articular surfaces of the femoral head and acetabulum remains smoothly rounded. The femoral neck, intertrochanteric, and subtrochanteric regions are normal. IMPRESSION: There is no acute or significant chronic bony abnormality of the left hip. Electronically Signed   By: David  [place] M.D.   On: 07/28/2017 09:59    ____________________________________________    PROCEDURES  Procedure(s) performed:    Procedures    Medications  ketorolac (TORADOL) 30 MG/ML injection 30 mg (30 mg  Intramuscular Given 07/28/17 1058)     ____________________________________________   INITIAL IMPRESSION / ASSESSMENT AND PLAN / ED COURSE  Pertinent labs & imaging results that were available during my care of the patient were reviewed by me and considered in my medical decision making (see chart for details).  Review of the [place] CSRS was performed in accordance of the [facility] prior to dispensing any controlled drugs.  Patient presented to the emergency department for evaluation of hip pain for 1 week.  Vital signs and exam are reassuring.  X-ray negative for acute bony abnormalities.  Patient will be discharged home with prescriptions for Mobic and Lidoderm. Patient is to follow up with PCP as directed. Patient is given ED precautions to return to the ED for any worsening or new symptoms.     ____________________________________________  FINAL CLINICAL IMPRESSION(S) / ED DIAGNOSES  Final diagnoses:  Left hip pain      NEW MEDICATIONS STARTED DURING THIS VISIT:  ED Discharge Orders        Ordered    meloxicam (MOBIC) 7.5 MG tablet  Daily     07/28/17 1111    lidocaine (LIDODERM) 5 %  Every 12 hours     07/28/17 1111          This chart was dictated using voice recognition software/Dragon. Despite best efforts to proofread, errors can occur which can change the meaning. Any change was purely unintentional.    Enid Derry, PA-C 07/28/17 1545    Emily Filbert, MD 07/29/17 [phone]

## 2017-09-08 ENCOUNTER — Other Ambulatory Visit: Payer: Self-pay | Admitting: Orthopedic Surgery

## 2017-09-08 DIAGNOSIS — M533 Sacrococcygeal disorders, not elsewhere classified: Secondary | ICD-10-CM

## 2017-09-14 ENCOUNTER — Emergency Department (HOSPITAL_COMMUNITY): Payer: Medicaid Other

## 2017-09-14 ENCOUNTER — Other Ambulatory Visit: Payer: Self-pay

## 2017-09-14 ENCOUNTER — Emergency Department (HOSPITAL_COMMUNITY)
Admission: EM | Admit: 2017-09-14 | Discharge: 2017-09-14 | Disposition: A | Discharge status: Home / Self Care | Payer: Medicaid Other | Attending: Emergency Medicine | Admitting: Emergency Medicine

## 2017-09-14 ENCOUNTER — Encounter (HOSPITAL_COMMUNITY): Payer: Self-pay | Admitting: Emergency Medicine

## 2017-09-14 DIAGNOSIS — J45909 Unspecified asthma, uncomplicated: Secondary | ICD-10-CM | POA: Diagnosis not present

## 2017-09-14 DIAGNOSIS — E039 Hypothyroidism, unspecified: Secondary | ICD-10-CM | POA: Diagnosis not present

## 2017-09-14 DIAGNOSIS — Z79899 Other long term (current) drug therapy: Secondary | ICD-10-CM | POA: Insufficient documentation

## 2017-09-14 DIAGNOSIS — M25552 Pain in left hip: Secondary | ICD-10-CM | POA: Insufficient documentation

## 2017-09-14 DIAGNOSIS — W19XXXA Unspecified fall, initial encounter: Secondary | ICD-10-CM

## 2017-09-14 MED ORDER — METHOCARBAMOL 500 MG PO TABS: 500.0000 mg | ORAL_TABLET | Freq: Three times a day (TID) | ORAL | 0 refills | Status: DC | PRN

## 2017-09-14 MED ORDER — NAPROXEN 500 MG PO TABS: 500.0000 mg | ORAL_TABLET | Freq: Two times a day (BID) | ORAL | 0 refills | Status: DC

## 2017-09-14 NOTE — Discharge Instructions (Addendum)
Please read and follow all provided instructions.  You have been seen today for a fall with hip and lower back pain  Tests performed today include: An x-ray of the affected area - does NOT show any broken bones or dislocations.  Vital signs. See below for your results today.   Home care instructions: -- *PRICE in the first 24-48 hours after injury: Rest Ice- Do not apply ice pack directly to your skin, place towel or similar between your skin and ice/ice pack. Apply ice for 20 min, then remove for 40 min while awake Elevate affected extremity above the level of your heart when not walking around for the first 24-48 hours   Medications:  - Naproxen is a nonsteroidal anti-inflammatory medication that will help with pain and swelling. Be sure to take this medication as prescribed with food, 1 pill every 12 hours,  It should be taken with food, as it can cause stomach upset, and more seriously, stomach bleeding. Do not take other nonsteroidal anti-inflammatory medications with this such as Advil, Motrin, Aleve, Mobic, Goodie Powder, toradol, or Motrin.    - Robaxin is the muscle relaxer I have prescribed, this is meant to help with muscle tightness. Be aware that this medication may make you drowsy therefore the first time you take this it should be at a time you are in an environment where you can rest. Do not drive or operate heavy machinery when taking this medication. Do not drink alcohol or take other sedating medications with this medicine such as narcotics or benzodiazepines.   You make take Tylenol per over the counter dosing with these medications.   We have prescribed you new medication(s) today. Discuss the medications prescribed today with your pharmacist as they can have adverse effects and interactions with your other medicines including over the counter and prescribed medications. Seek medical evaluation if you start to experience new or abnormal symptoms after taking one of these  medicines, seek care immediately if you start to experience difficulty breathing, feeling of your throat closing, facial swelling, or rash as these could be indications of a more serious allergic reaction   Follow-up instructions: Please follow-up with your primary care provider if you continue to have significant pain in 1 week. In this case you may have a more severe injury that requires further care.   Return instructions:  Please return if your digits or extremity are numb or tingling, appear gray or blue, or you have severe pain (also elevate the extremity and loosen splint or wrap if you were given one) Please return if you have redness or fevers.  Please return to the Emergency Department if you experience worsening symptoms.  Please return if you have any other emergent concerns. Additional Information:  Your vital signs today were: BP (!) 152/107 (BP Location: Right Arm)    Pulse (!) 103    Temp 98.9 F (37.2 C) (Oral)    Resp 18    SpO2 99%  If your blood pressure (BP) was elevated above 135/85 this visit, please have this repeated by your doctor within one month. ---------------
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
## 2017-09-17 ENCOUNTER — Other Ambulatory Visit: Payer: Self-pay | Admitting: Orthopedic Surgery

## 2017-09-17 DIAGNOSIS — M533 Sacrococcygeal disorders, not elsewhere classified: Secondary | ICD-10-CM

## 2017-10-01 ENCOUNTER — Ambulatory Visit
Admission: RE | Admit: 2017-10-01 | Discharge: 2017-10-01 | Disposition: A | Discharge status: Home / Self Care | Payer: Medicaid Other | Source: Ambulatory Visit | Attending: Orthopedic Surgery | Admitting: Orthopedic Surgery

## 2017-10-01 DIAGNOSIS — R609 Edema, unspecified: Secondary | ICD-10-CM | POA: Insufficient documentation

## 2017-10-01 DIAGNOSIS — M533 Sacrococcygeal disorders, not elsewhere classified: Secondary | ICD-10-CM | POA: Diagnosis present

## 2017-11-14 ENCOUNTER — Emergency Department
Admission: EM | Admit: 2017-11-14 | Discharge: 2017-11-15 | Disposition: A | Discharge status: Home / Self Care | Payer: Medicaid Other | Attending: Emergency Medicine | Admitting: Emergency Medicine

## 2017-11-14 ENCOUNTER — Other Ambulatory Visit: Payer: Self-pay

## 2017-11-14 DIAGNOSIS — E039 Hypothyroidism, unspecified: Secondary | ICD-10-CM | POA: Insufficient documentation

## 2017-11-14 DIAGNOSIS — Z79899 Other long term (current) drug therapy: Secondary | ICD-10-CM | POA: Diagnosis not present

## 2017-11-14 DIAGNOSIS — R319 Hematuria, unspecified: Secondary | ICD-10-CM | POA: Insufficient documentation

## 2017-11-14 DIAGNOSIS — L304 Erythema intertrigo: Secondary | ICD-10-CM | POA: Insufficient documentation

## 2017-11-14 DIAGNOSIS — M545 Low back pain, unspecified: Secondary | ICD-10-CM

## 2017-11-14 DIAGNOSIS — J45909 Unspecified asthma, uncomplicated: Secondary | ICD-10-CM | POA: Diagnosis not present

## 2017-11-14 DIAGNOSIS — R300 Dysuria: Secondary | ICD-10-CM | POA: Diagnosis present

## 2017-11-14 NOTE — ED Notes (Signed)
Patient to waiting room via wheelchair by EMS.  Per EMS patient with complaint of back pain since 3 pm.  Reports blood in urine earlier.  BP 157/95; CBG 220, 96% on room air, pulse 119.

## 2017-11-14 NOTE — ED Provider Notes (Signed)
[facility] Emergency Department Provider Note  ____________________________________________   First MD Initiated Contact with Patient 11/14/17 2345     (approximate)  I have reviewed the triage vital signs and the nursing notes.   HISTORY  Chief Complaint Groin Pain and Back Pain   HPI Rhonda Palmer is a 47 y.o. female who comes to the emergency department via EMS with right low back pain that began around 3 PM today.  Symptoms came on gradually and have been slowly progressive.  She also reports urinary frequency, dysuria, and hematuria for the past several days.  No fevers or chills.  No nausea or vomiting.  She also notes a foul smell that is been chronic and constant.  She last had sexual intercourse 8 years ago.  She denies vaginal discharge.  Her symptoms seem to be worse when urinating and improved when not.  Her back pain is also worse when sitting up or twisting.    Past Medical History:  Diagnosis Date  . Asthma   . Bipolar 1 disorder (HCC)   . Edema leg for twenty years per patient   taking lasix for edema  . Fibromyalgia   . Ovarian cyst   . Schizo affective schizophrenia (HCC)   . Thyroid disease     Patient Active Problem List   Diagnosis Date Noted  . Fall 03/27/2017  . Long term (current) use of opiate analgesic 06/26/2016  . Long term prescription opiate use 06/26/2016  . Opiate use 06/26/2016  . Obesity 01/17/2016  . History of opioid abuse (HCC) 01/17/2016  . Asthma 01/17/2016  . Mixed hyperlipidemia 10/09/2015  . Hypokalemia 10/09/2015  . Headache above the eye region 06/16/2013  . Edema extremities 05/18/2013  . 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 GERD (gastroesophageal reflux disease) 04/11/2013  . Personality disorder (HCC) 10/16/2012  . Chronic pain syndrome 06/18/2012  . S/P ankle fusion 05/21/2012  . Schizoaffective disorder, bipolar type (HCC) 02/20/2012  . 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 Hypothyroidism 11/16/2002    Past Surgical History:  Procedure Laterality Date  . ABDOMINAL HYSTERECTOMY    . ANKLE SURGERY     pt. states long time ago    Prior to Admission medications   Medication Sig Start Date End Date Taking? Authorizing Provider  ARIPiprazole (ABILIFY) 10 MG tablet Take 10 mg by mouth daily.    [provider]  cephALEXin (KEFLEX) 500 MG capsule Take 1 capsule (500 mg total) by mouth 3 (three) times daily for 5 days. 11/15/17 11/20/17  Merrily Brittle, MD  furosemide (LASIX) 20 MG tablet Take 2 tablets (40 mg total) by mouth daily. 05/13/16   Nita Sickle, MD  HYDROcodone-acetaminophen [facility]) 5-325 MG tablet Take 1 tablet by mouth every 6 (six) hours as needed for up to 7 doses for severe pain. 11/15/17   Merrily Brittle, MD  levothyroxine (SYNTHROID, LEVOTHROID) 100 MCG tablet Take 100 mcg by mouth daily before breakfast.    [provider]  lidocaine (LIDODERM) 5 % Place 1 patch onto the skin every 12 (twelve) hours. Remove & Discard patch within 12 hours or as directed by MD 07/28/17 07/28/18  Enid Derry, PA-C  methocarbamol (ROBAXIN) 500 MG tablet Take 1 tablet (500 mg total) by mouth every 8 (eight) hours as needed. 09/14/17   Petrucelli, Samantha R, PA-C  mirtazapine (REMERON) 30 MG tablet TAKE 1 TABLET BY MOUTH  AT BEDTIME AS NEEDED FOR SLEEP 02/13/16   [provider]  naproxen (NAPROSYN) 500 MG tablet Take 1 tablet (500 mg total) by mouth 2 (two) times daily. 09/14/17   Petrucelli, Samantha R, PA-C  nystatin (MYCOSTATIN/NYSTOP) powder Apply topically 4 (four) times daily. 11/15/17   Merrily Brittle, MD  ondansetron (ZOFRAN ODT) 4 MG disintegrating tablet Take 1 tablet (4 mg total) by mouth every 8 (eight) hours as needed for nausea or vomiting. 04/14/17   Rebecka Apley, MD  traMADol (ULTRAM) 50 MG tablet Take 0.5 tablets (25 mg  total) by mouth every 6 (six) hours as needed. 06/10/17 06/10/18  Enid Derry, PA-C  venlafaxine (EFFEXOR) 100 MG tablet Take 100 mg by mouth 2 (two) times daily.    [provider]    Allergies Codeine; Sulfa antibiotics; Penicillins; and Aspirin  Family History  Problem Relation Age of Onset  . Heart failure Mother   . Bladder Cancer Neg Hx   . Kidney cancer Neg Hx     Social History Social History   Tobacco Use  . Smoking status: Never Smoker  . Smokeless tobacco: Never Used  Substance Use Topics  . Alcohol use: No  . Drug use: No    Review of Systems Constitutional: No fever/chills Eyes: No visual changes. ENT: No sore throat. Cardiovascular: Denies chest pain. Respiratory: Denies shortness of breath. Gastrointestinal: No abdominal pain.  No nausea, no vomiting.  No diarrhea.  No constipation. Genitourinary: Positive for dysuria. Musculoskeletal: Positive for back pain. Skin: Positive for rash. Neurological: Negative for headaches, focal weakness or numbness.   ____________________________________________   PHYSICAL EXAM:  VITAL SIGNS: ED Triage Vitals [11/14/17 2318]  Enc Vitals Group     BP      Pulse      Resp      Temp      Temp src      SpO2      Weight 280 lb (127 kg)     Height 5' (1.524 m)     Head Circumference      Peak Flow      Pain Score 8     Pain Loc      Pain Edu?      Excl. in GC?     Constitutional: Alert and oriented x4 pleasant cooperative speaks in full clear sentences no diaphoresis Eyes: PERRL EOMI. Head: Atraumatic. Nose: No congestion/rhinnorhea. Mouth/Throat: No trismus Neck: No stridor.   Cardiovascular: Normal rate, regular rhythm. Grossly normal heart sounds.  Good peripheral circulation. Respiratory: Normal respiratory effort.  No retractions. Lungs CTAB and moving good air Gastrointestinal: Morbidly obese abdomen soft nondistended nontender no rebound or guarding no peritonitis no costovertebral  tenderness Musculoskeletal: No lower extremity edema   No midline back tenderness.  No paraspinal tenderness or spasm Neurologic:  Normal speech and language. No gross focal neurologic deficits are appreciated. Skin: Please see photo below for intertrigo in her left lower quadrant under her pannus.  She has a similar area that appears fungal under her left breast Psychiatric: Mood and affect are normal. Speech and behavior are normal.      ____________________________________________   DIFFERENTIAL includes but not limited to  Urinary tract infection, pyelonephritis, nephrolithiasis, intertrigo, musculoskeletal pain ____________________________________________   LABS (all labs ordered are listed, but only abnormal results are displayed)  Labs Reviewed  URINALYSIS, COMPLETE (UACMP) WITH MICROSCOPIC - Abnormal; Notable for the following components:      Result Value   Color, Urine YELLOW (*)  APPearance CLOUDY (*)    Hgb urine dipstick SMALL (*)    Leukocytes, UA TRACE (*)    Bacteria, UA RARE (*)    All other components within normal limits  BASIC METABOLIC PANEL - Abnormal; Notable for the following components:   Glucose, Bld 127 (*)    All other components within normal limits  CBC - Abnormal; Notable for the following components:   Hemoglobin 11.5 (*)    MCH 24.6 (*)    MCHC 29.4 (*)    RDW 17.0 (*)    Platelets 414 (*)    nRBC 0.6 (*)    All other components within normal limits  URINE CULTURE    Lab work reviewed by me shows bacteriuria although negative for nitrites.  She does have chronic anemia __________________________________________  EKG   ____________________________________________  RADIOLOGY  CT stone reviewed by me with no clear etiology of the patient's symptoms identified ____________________________________________   PROCEDURES  Procedure(s) performed: no  Procedures  Critical Care performed:  no  ____________________________________________   INITIAL IMPRESSION / ASSESSMENT AND PLAN / ED COURSE  Pertinent labs & imaging results that were available during my care of the patient were reviewed by me and considered in my medical decision making (see chart for details).   As part of my medical decision making, I reviewed the following data within the electronic MEDICAL RECORD NUMBER History obtained from family if available, nursing notes, old chart and ekg, as well as notes from prior ED visits.  The patient comes to the emergency department with flank, back pain along with dysuria and hematuria.  Lab work is reassuring and I sent her off to CT stone to evaluate for stone versus mass.  Her CT scan does not show a clear etiology of her symptoms.  I appreciate that her urinalysis is nitrite negative and does have squamous cells however she reports dysuria and frequency and is possible that she has a urinary tract infection and an early pyelonephritis.  I will send a culture and treat her with a dose of ceftriaxone here as well as 5 days of Keflex for home.  Regarding the foul smell and pain on the left lower quadrant she clearly has a fungal intertrigo.  Given nystatin here as well as nystatin for home.  She did receive 4 mg of IV morphine and 4 mg of IV Zofran for pain and nausea here in the emergency department with significant improvement in her symptoms.  I will also prescribe her a short course of hydrocodone for home.  She is discharged home in improved condition.      ____________________________________________   FINAL CLINICAL IMPRESSION(S) / ED DIAGNOSES  Final diagnoses:  Dysuria  Intertrigo  Acute right-sided low back pain without sciatica      NEW MEDICATIONS STARTED DURING THIS VISIT:  New Prescriptions   CEPHALEXIN (KEFLEX) 500 MG CAPSULE    Take 1 capsule (500 mg total) by mouth 3 (three) times daily for 5 days.   HYDROCODONE-ACETAMINOPHEN (NORCO) 5-325 MG TABLET     Take 1 tablet by mouth every 6 (six) hours as needed for up to 7 doses for severe pain.   NYSTATIN (MYCOSTATIN/NYSTOP) POWDER    Apply topically 4 (four) times daily.     Note:  This document was prepared using Dragon voice recognition software and may include unintentional dictation errors.     Merrily Brittle, MD 11/15/17 [phone]

## 2017-11-14 NOTE — ED Triage Notes (Signed)
Patient to ED via EMS from home.  Reports at approximately 3pm began to have right groin pain that radiates into right lower back. ?blood in urine.

## 2017-11-15 ENCOUNTER — Emergency Department: Payer: Medicaid Other

## 2017-11-15 LAB — BASIC METABOLIC PANEL
ANION GAP: 8 (ref 5–15)
BUN: 14 mg/dL (ref 6–20)
CALCIUM: 9.5 mg/dL (ref 8.9–10.3)
CO2: 28 mmol/L (ref 22–32)
Chloride: 107 mmol/L (ref 98–111)
Creatinine, Ser: 0.53 mg/dL (ref 0.44–1.00)
Glucose, Bld: 127 mg/dL — ABNORMAL HIGH (ref 70–99)
Potassium: 3.9 mmol/L (ref 3.5–5.1)
Sodium: 143 mmol/L (ref 135–145)

## 2017-11-15 LAB — CBC
HCT: 39.1 % (ref 36.0–46.0)
Hemoglobin: 11.5 g/dL — ABNORMAL LOW (ref 12.0–15.0)
MCH: 24.6 pg — AB (ref 26.0–34.0)
MCHC: 29.4 g/dL — ABNORMAL LOW (ref 30.0–36.0)
MCV: 83.7 fL (ref 80.0–100.0)
NRBC: 0.6 % — AB (ref 0.0–0.2)
PLATELETS: 414 10*3/uL — AB (ref 150–400)
RBC: 4.67 MIL/uL (ref 3.87–5.11)
RDW: 17 % — AB (ref 11.5–15.5)
WBC: 9.1 10*3/uL (ref 4.0–10.5)

## 2017-11-15 LAB — URINALYSIS, COMPLETE (UACMP) WITH MICROSCOPIC
Bilirubin Urine: NEGATIVE
Glucose, UA: NEGATIVE mg/dL
KETONES UR: NEGATIVE mg/dL
Nitrite: NEGATIVE
PROTEIN: NEGATIVE mg/dL
Specific Gravity, Urine: 1.019 (ref 1.005–1.030)
pH: 5 (ref 5.0–8.0)

## 2017-11-15 MED ORDER — CEPHALEXIN 500 MG PO CAPS: 500.0000 mg | ORAL_CAPSULE | Freq: Three times a day (TID) | ORAL | 0 refills | Status: AC

## 2017-11-15 MED ORDER — NYSTATIN 100000 UNIT/GM EX OINT
TOPICAL_OINTMENT | Freq: Once | CUTANEOUS | Status: AC
  Administered 2017-11-15: 1 via TOPICAL
  Filled 2017-11-15: qty 15

## 2017-11-15 MED ORDER — ONDANSETRON HCL 4 MG/2ML IJ SOLN
4.0000 mg | Freq: Once | INTRAMUSCULAR | Status: AC
  Administered 2017-11-15: 4 mg via INTRAVENOUS
  Filled 2017-11-15: qty 2

## 2017-11-15 MED ORDER — HYDROCODONE-ACETAMINOPHEN 5-325 MG PO TABS: 1.0000 | ORAL_TABLET | Freq: Four times a day (QID) | ORAL | 0 refills | Status: DC | PRN

## 2017-11-15 MED ORDER — MORPHINE SULFATE (PF) 4 MG/ML IV SOLN
4.0000 mg | Freq: Once | INTRAVENOUS | Status: AC
  Administered 2017-11-15: 4 mg via INTRAVENOUS
  Filled 2017-11-15: qty 1

## 2017-11-15 MED ORDER — SODIUM CHLORIDE 0.9 % IV SOLN
1.0000 g | Freq: Once | INTRAVENOUS | Status: AC
  Administered 2017-11-15: 1 g via INTRAVENOUS
  Filled 2017-11-15: qty 10

## 2017-11-15 MED ORDER — NYSTATIN 100000 UNIT/GM EX POWD: Freq: Four times a day (QID) | CUTANEOUS | 0 refills | Status: DC

## 2017-11-15 NOTE — ED Notes (Signed)
Patient transported to CT 

## 2017-11-15 NOTE — Discharge Instructions (Addendum)
Please take all of your antibiotics as prescribed and make sure you use your antifungal powder under your left breast and under your left pannus 4 times a day for the next 2 weeks.  Follow-up with your primary care physician within 2 days for recheck and return to the emergency department for any concerns.  It was a pleasure to take care of you today, and thank you for coming to our emergency department.  If you have any questions or concerns before leaving please ask the nurse to grab me and I'm more than happy to go through your aftercare instructions again.  If you were prescribed any opioid pain medication today such as Norco, Vicodin, Percocet, morphine, hydrocodone, or oxycodone please make sure you do not drive when you are taking this medication as it can alter your ability to drive safely.  If you have any concerns once you are home that you are not improving or are in fact getting worse before you can make it to your follow-up appointment, please do not hesitate to call 911 and come back for further evaluation.  Merrily Brittle, MD  Results for orders placed or performed during the hospital encounter of 11/14/17  Urinalysis, Complete w Microscopic  Result Value Ref Range   Color, Urine YELLOW (A) YELLOW   APPearance CLOUDY (A) CLEAR   Specific Gravity, Urine 1.019 1.005 - 1.030   pH 5.0 5.0 - 8.0   Glucose, UA NEGATIVE NEGATIVE mg/dL   Hgb urine dipstick SMALL (A) NEGATIVE   Bilirubin Urine NEGATIVE NEGATIVE   Ketones, ur NEGATIVE NEGATIVE mg/dL   Protein, ur NEGATIVE NEGATIVE mg/dL   Nitrite NEGATIVE NEGATIVE   Leukocytes, UA TRACE (A) NEGATIVE   RBC / HPF 0-5 0 - 5 RBC/hpf   WBC, UA 0-5 0 - 5 WBC/hpf   Bacteria, UA RARE (A) NONE SEEN   Squamous Epithelial / LPF 11-20 0 - 5   Mucus PRESENT   Basic metabolic panel  Result Value Ref Range   Sodium 143 135 - 145 mmol/L   Potassium 3.9 3.5 - 5.1 mmol/L   Chloride 107 98 - 111 mmol/L   CO2 28 22 - 32 mmol/L   Glucose, Bld 127  (H) 70 - 99 mg/dL   BUN 14 6 - 20 mg/dL   Creatinine, Ser 1.61 0.44 - 1.00 mg/dL   Calcium 9.5 8.9 - 09.6 mg/dL   GFR calc non Af Amer >60 >60 mL/min   GFR calc Af Amer >60 >60 mL/min   Anion gap 8 5 - 15  CBC  Result Value Ref Range   WBC 9.1 4.0 - 10.5 K/uL   RBC 4.67 3.87 - 5.11 MIL/uL   Hemoglobin 11.5 (L) 12.0 - 15.0 g/dL   HCT 04.5 40.9 - 81.1 %   MCV 83.7 80.0 - 100.0 fL   MCH 24.6 (L) 26.0 - 34.0 pg   MCHC 29.4 (L) 30.0 - 36.0 g/dL   RDW 91.4 (H) 78.2 - 95.6 %   Platelets 414 (H) 150 - 400 K/uL   nRBC 0.6 (H) 0.0 - 0.2 %   Ct Renal Stone Study  Result Date: 11/15/2017 CLINICAL DATA:  Back pain since 3 p.m.  Hematuria. EXAM: CT ABDOMEN AND PELVIS WITHOUT CONTRAST TECHNIQUE: Multidetector CT imaging of the abdomen and pelvis was performed following the standard protocol without IV contrast. COMPARISON:  CT AP 04/14/2017 FINDINGS: Lower chest: Top-normal heart size. Clear lung bases. Minimal dependent bibasilar atelectasis. Hepatobiliary: Cholecystectomy. The unenhanced liver is unremarkable.  Pancreas: Normal Spleen: Normal Adrenals/Urinary Tract: Adrenal glands are unremarkable. Kidneys are normal, without renal calculi, focal lesion, or hydronephrosis. Bladder is unremarkable. Stomach/Bowel: Stomach is within normal limits. Appendix appears normal in caliber and slightly filled with enteric contrast. No evidence of bowel wall thickening, distention, or inflammatory changes. Vascular/Lymphatic: No significant vascular findings are present. No enlarged abdominal or pelvic lymph nodes. Reproductive: Status post hysterectomy. Stable 1.5 cm left follicle. Other: Tiny periumbilical fat containing hernia. No abdominopelvic ascites. Musculoskeletal: Degenerative disc disease L5-S1. No acute nor suspicious osseous lesions. IMPRESSION: No acute intra-abdominal nor pelvic abnormality. Tiny fat containing periumbilical hernia. Physiologic sized follicle noted in the left ovary. Electronically  Signed   By: Tollie Eth M.D.   On: 11/15/2017 01:58

## 2017-11-17 LAB — URINE CULTURE

## 2017-12-20 ENCOUNTER — Other Ambulatory Visit: Payer: Self-pay

## 2017-12-20 ENCOUNTER — Encounter: Payer: Self-pay | Admitting: Emergency Medicine

## 2017-12-20 ENCOUNTER — Emergency Department
Admission: EM | Admit: 2017-12-20 | Discharge: 2017-12-20 | Disposition: A | Discharge status: Home / Self Care | Payer: Medicaid Other | Attending: Emergency Medicine | Admitting: Emergency Medicine

## 2017-12-20 DIAGNOSIS — J45909 Unspecified asthma, uncomplicated: Secondary | ICD-10-CM | POA: Diagnosis not present

## 2017-12-20 DIAGNOSIS — Z79899 Other long term (current) drug therapy: Secondary | ICD-10-CM | POA: Insufficient documentation

## 2017-12-20 DIAGNOSIS — F410 Panic disorder [episodic paroxysmal anxiety] without agoraphobia: Secondary | ICD-10-CM | POA: Insufficient documentation

## 2017-12-20 HISTORY — DX: Anxiety disorder, unspecified: F41.9

## 2017-12-20 MED ORDER — ACETAMINOPHEN 500 MG PO TABS
ORAL_TABLET | ORAL | Status: AC
  Filled 2017-12-20: qty 2

## 2017-12-20 MED ORDER — ACETAMINOPHEN 500 MG PO TABS
1000.0000 mg | ORAL_TABLET | Freq: Once | ORAL | Status: AC
  Administered 2017-12-20: 1000 mg via ORAL

## 2017-12-20 MED ORDER — LORAZEPAM 1 MG PO TABS
1.0000 mg | ORAL_TABLET | Freq: Once | ORAL | Status: AC
  Administered 2017-12-20: 1 mg via ORAL
  Filled 2017-12-20: qty 1

## 2017-12-20 MED ORDER — LORAZEPAM 2 MG PO TABS
2.0000 mg | ORAL_TABLET | Freq: Once | ORAL | Status: AC
  Administered 2017-12-20: 2 mg via ORAL
  Filled 2017-12-20: qty 1

## 2017-12-20 MED ORDER — LORAZEPAM 0.5 MG PO TABS: 0.5000 mg | ORAL_TABLET | Freq: Two times a day (BID) | ORAL | 0 refills | Status: DC

## 2017-12-20 NOTE — ED Notes (Signed)
Pt c/o headache. Informed MD

## 2017-12-20 NOTE — ED Provider Notes (Signed)
[facility] Emergency Department Provider Note   ____________________________________________    I have reviewed the triage vital signs and the nursing notes.   HISTORY  Chief Complaint Panic Attack     HPI Rhonda Palmer is a 47 y.o. female who presents with complaints of anxiety attack.  Patient reports a history of panic attacks and follows with [facility].  She reports that she has been compliant with her medications.  This morning she woke up with a sense of severe anxiety and none of her medications have seemed to help.  No self injury, suicidal ideation.  No chest pain or shortness of breath.  No nausea or vomiting. symptoms are consistent with prior panic attacks   Past Medical History:  Diagnosis Date  . Anxiety   . Asthma   . Bipolar 1 disorder (HCC)   . Edema leg for twenty years per patient   taking lasix for edema  . Fibromyalgia   . Ovarian cyst   . Schizo affective schizophrenia (HCC)   . Thyroid disease     Patient Active Problem List   Diagnosis Date Noted  . Fall 03/27/2017  . Long term (current) use of opiate analgesic 06/26/2016  . Long term prescription opiate use 06/26/2016  . Opiate use 06/26/2016  . Obesity 01/17/2016  . History of opioid abuse (HCC) 01/17/2016  . Asthma 01/17/2016  . Mixed hyperlipidemia 10/09/2015  . Hypokalemia 10/09/2015  . Headache above the eye region 06/16/2013  . Edema extremities 05/18/2013  . 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 GERD (gastroesophageal reflux disease) 04/11/2013  . Personality disorder (HCC) 10/16/2012  . Chronic pain syndrome 06/18/2012  . S/P ankle fusion 05/21/2012  . Schizoaffective disorder, bipolar type (HCC) 02/20/2012  . 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 Hypothyroidism 11/16/2002    Past Surgical History:  Procedure Laterality Date   . ABDOMINAL HYSTERECTOMY    . ANKLE SURGERY     pt. states long time ago    Prior to Admission medications   Medication Sig Start Date End Date Taking? Authorizing Provider  ARIPiprazole (ABILIFY) 10 MG tablet Take 10 mg by mouth daily.    [provider]  furosemide (LASIX) 20 MG tablet Take 2 tablets (40 mg total) by mouth daily. 05/13/16   Nita Sickle, MD  HYDROcodone-acetaminophen [facility]) 5-325 MG tablet Take 1 tablet by mouth every 6 (six) hours as needed for up to 7 doses for severe pain. 11/15/17   Merrily Brittle, MD  levothyroxine (SYNTHROID, LEVOTHROID) 100 MCG tablet Take 100 mcg by mouth daily before breakfast.    [provider]  lidocaine (LIDODERM) 5 % Place 1 patch onto the skin every 12 (twelve) hours. Remove & Discard patch within 12 hours or as directed by MD 07/28/17 07/28/18  Enid Derry, PA-C  LORazepam (ATIVAN) 0.5 MG tablet Take 1 tablet (0.5 mg total) by mouth 2 (two) times daily. 12/20/17 12/20/18  Jene Every, MD  methocarbamol (ROBAXIN) 500 MG tablet Take 1 tablet (500 mg total) by mouth every 8 (eight) hours as needed. 09/14/17   Petrucelli, Samantha R, PA-C  mirtazapine (REMERON) 30 MG tablet TAKE 1 TABLET BY MOUTH AT BEDTIME AS NEEDED FOR SLEEP 02/13/16   [provider]  naproxen (NAPROSYN) 500 MG tablet Take 1 tablet (500 mg total) by mouth 2 (two) times daily. 09/14/17   Petrucelli, Pleas Koch, PA-C  nystatin (MYCOSTATIN/NYSTOP) powder Apply topically 4 (four) times daily. 11/15/17   Merrily Brittleifenbark, Neil, MD  ondansetron (ZOFRAN ODT) 4 MG disintegrating tablet Take 1 tablet (4 mg total) by mouth every 8 (eight) hours as needed for nausea or vomiting. 04/14/17   Rebecka ApleyWebster, Allison P, MD  traMADol (ULTRAM) 50 MG tablet Take 0.5 tablets (25 mg total) by mouth every 6 (six) hours as needed. 06/10/17 06/10/18  Enid DerryWagner, Ashley, PA-C  venlafaxine (EFFEXOR) 100 MG tablet Take 100 mg by mouth 2 (two) times daily.    [provider]      Allergies Codeine; Sulfa antibiotics; Penicillins; and Aspirin  Family History  Problem Relation Age of Onset  . Heart failure Mother   . Bladder Cancer Neg Hx   . Kidney cancer Neg Hx     Social History Social History   Tobacco Use  . Smoking status: Never Smoker  . Smokeless tobacco: Never Used  Substance Use Topics  . Alcohol use: No  . Drug use: No    Review of Systems  Constitutional: No fever/chills Eyes: No visual changes.  ENT: No sore throat. Cardiovascular: Denies chest pain. Respiratory: Denies shortness of breath. Gastrointestinal: No abdominal pain.  No nausea, no vomiting.   Genitourinary: Negative for dysuria. Musculoskeletal: Negative for back pain. Skin: Negative for rash. Neurological: Negative for headaches or weakness   ____________________________________________   PHYSICAL EXAM:  VITAL SIGNS: ED Triage Vitals  Enc Vitals Group     BP 12/20/17 1535 118/78     Pulse Rate 12/20/17 1535 94     Resp 12/20/17 1535 16     Temp 12/20/17 1535 98 F (36.7 C)     Temp Source 12/20/17 1535 Oral     SpO2 12/20/17 1535 99 %     Weight 12/20/17 1540 127 kg (279 lb 15.8 oz)     Height --      Head Circumference --      Peak Flow --      Pain Score 12/20/17 1540 0     Pain Loc --      Pain Edu? --      Excl. in GC? --     Constitutional: Alert and oriented.  Anxious Eyes: Conjunctivae are normal.   Nose: No congestion/rhinnorhea. Mouth/Throat: Mucous membranes are moist.    Cardiovascular: Normal rate, regular rhythm. Grossly normal heart sounds.  Good peripheral circulation. Respiratory: Normal respiratory effort.  No retractions.  Gastrointestinal: Soft and nontender. No distention  Musculoskeletal:   Warm and well perfused Neurologic:  Normal speech and language. No gross focal neurologic deficits are appreciated.  Skin:  Skin is warm, dry and intact. No rash noted. Psychiatric: Anxious mood.  Speech and behavior are  normal.  ____________________________________________   LABS (all labs ordered are listed, but only abnormal results are displayed)  Labs Reviewed - No data to display ____________________________________________  EKG  ED ECG REPORT I, Jene Everyobert Emmily Pellegrin, the attending physician, personally viewed and interpreted this ECG.  Date: 12/20/2017  Rhythm: normal sinus rhythm QRS Axis: normal Intervals: normal ST/T Wave abnormalities: normal Narrative Interpretation: no evidence of acute ischemia  ____________________________________________  RADIOLOGY  None ____________________________________________   PROCEDURES  Procedure(s) performed: No  Procedures   Critical Care performed: No ____________________________________________   INITIAL IMPRESSION / ASSESSMENT AND PLAN / ED COURSE  Pertinent labs & imaging results that were available during my care of the patient were reviewed by me and considered in my medical decision making (see chart for details).  Patient well-appearing in no acute distress.  Exam is reassuring.  EKG unremarkable, vital signs unremarkable.  We will treat with p.o. Ativan and reevaluate  Patient had significant improvement after treatment, she states that she is ready to call her husband to pick her up.  Recommend outpatient follow-up with her mental health professional.    ____________________________________________   FINAL CLINICAL IMPRESSION(S) / ED DIAGNOSES  Final diagnoses:  Anxiety attack        Note:  This document was prepared using Dragon voice recognition software and may include unintentional dictation errors.    Jene Every, MD 12/20/17 [phone]

## 2017-12-20 NOTE — ED Triage Notes (Addendum)
C/O anxiety attack since this am.  States awoke this morning feeling this way.  Called Mental Health to try to get a provider to change her medicine, but was unable to get a hold of provider, so referred to ED.  Patient currently takes Klonidine 0.5 mg BID, effexor 150 mg QD, levothyroxine 300 mg, jardance  QD.  Patient is AAOx3.  Skin warm and dry. NAD.

## 2017-12-20 NOTE — ED Notes (Signed)
Pt given meal tray and water 

## 2017-12-20 NOTE — ED Notes (Signed)
Pt calm and cooperative at this time. Pt states severe anxiety and that her medicine is just not helping at this time. Respirations even and unlabored. Pt denies SI/HI and any pain.

## 2018-02-12 ENCOUNTER — Emergency Department: Payer: Medicaid Other

## 2018-02-12 ENCOUNTER — Emergency Department
Admission: EM | Admit: 2018-02-12 | Discharge: 2018-02-12 | Disposition: A | Discharge status: Home / Self Care | Payer: Medicaid Other | Attending: Emergency Medicine | Admitting: Emergency Medicine

## 2018-02-12 ENCOUNTER — Encounter: Payer: Self-pay | Admitting: Emergency Medicine

## 2018-02-12 DIAGNOSIS — E039 Hypothyroidism, unspecified: Secondary | ICD-10-CM | POA: Insufficient documentation

## 2018-02-12 DIAGNOSIS — R079 Chest pain, unspecified: Secondary | ICD-10-CM | POA: Diagnosis present

## 2018-02-12 DIAGNOSIS — J45909 Unspecified asthma, uncomplicated: Secondary | ICD-10-CM | POA: Diagnosis not present

## 2018-02-12 DIAGNOSIS — R0789 Other chest pain: Secondary | ICD-10-CM | POA: Diagnosis not present

## 2018-02-12 LAB — BASIC METABOLIC PANEL
ANION GAP: 7 (ref 5–15)
BUN: 13 mg/dL (ref 6–20)
CHLORIDE: 110 mmol/L (ref 98–111)
CO2: 24 mmol/L (ref 22–32)
Calcium: 8.9 mg/dL (ref 8.9–10.3)
Creatinine, Ser: 0.48 mg/dL (ref 0.44–1.00)
GFR calc non Af Amer: >60 mL/min (ref >60)
Glucose, Bld: 105 mg/dL — ABNORMAL HIGH (ref 70–99)
POTASSIUM: 3.7 mmol/L (ref 3.5–5.1)
Sodium: 141 mmol/L (ref 135–145)

## 2018-02-12 LAB — CBC
HEMATOCRIT: 39.8 % (ref 36.0–46.0)
Hemoglobin: 11.7 g/dL — ABNORMAL LOW (ref 12.0–15.0)
MCH: 24.5 pg — ABNORMAL LOW (ref 26.0–34.0)
MCHC: 29.4 g/dL — ABNORMAL LOW (ref 30.0–36.0)
MCV: 83.4 fL (ref 80.0–100.0)
NRBC: 0.3 % — AB (ref 0.0–0.2)
Platelets: 534 10*3/uL — ABNORMAL HIGH (ref 150–400)
RBC: 4.77 MIL/uL (ref 3.87–5.11)
RDW: 17.2 % — ABNORMAL HIGH (ref 11.5–15.5)
WBC: 11.4 10*3/uL — ABNORMAL HIGH (ref 4.0–10.5)

## 2018-02-12 LAB — TROPONIN I: Troponin I: <0.03 ng/mL (ref <0.03)

## 2018-02-12 LAB — FIBRIN DERIVATIVES D-DIMER (ARMC ONLY): FIBRIN DERIVATIVES D-DIMER (ARMC): 832.28 ng{FEU}/mL — AB (ref 0.00–499.00)

## 2018-02-12 MED ORDER — IOHEXOL 350 MG/ML SOLN
75.0000 mL | Freq: Once | INTRAVENOUS | Status: AC | PRN
  Administered 2018-02-12: 75 mL via INTRAVENOUS
  Filled 2018-02-12: qty 75

## 2018-02-12 MED ORDER — DIAZEPAM 5 MG PO TABS
10.0000 mg | ORAL_TABLET | Freq: Once | ORAL | Status: AC
  Administered 2018-02-12: 10 mg via ORAL
  Filled 2018-02-12: qty 2

## 2018-02-12 MED ORDER — HYDROMORPHONE HCL 1 MG/ML IJ SOLN
1.0000 mg | Freq: Once | INTRAMUSCULAR | Status: AC
  Administered 2018-02-12: 1 mg via INTRAVENOUS
  Filled 2018-02-12: qty 1

## 2018-02-12 NOTE — ED Triage Notes (Signed)
Pt in via EMS from home with c/o CP for the last 3 hours. EKG WNL. EMS reports pt with sores over her body that pt reports are from her cats.

## 2018-02-12 NOTE — ED Triage Notes (Signed)
Pt reports pain to her left chest for the past 3 hours. Pt reports has some SOB as well. Pt reports this is the third spell in the last 3 weeks. Pt described the pain as tight and non radiating.

## 2018-02-12 NOTE — ED Notes (Signed)
Patient transported to CT 

## 2018-02-12 NOTE — ED Provider Notes (Signed)
[facility] Emergency Department Provider Note       Time seen: ----------------------------------------- 4:33 PM on 02/12/2018 -----------------------------------------   I have reviewed the triage vital signs and the nursing notes.  HISTORY   Chief Complaint Chest Pain and Nausea   HPI Rhonda Palmer is a 48 y.o. female with a history of anxiety, asthma, bipolar disorder, edema, fibromyalgia, schizophrenia who presents to the ED for left-sided chest pain for the past 3 hours.  She has had some shortness of breath as well.  Patient states this is the third event she has had since Christmas time.  She states the pain is tight and nonradiating.  Past Medical History:  Diagnosis Date  . Anxiety   . Asthma   . Bipolar 1 disorder (HCC)   . Edema leg for twenty years per patient   taking lasix for edema  . Fibromyalgia   . Ovarian cyst   . Schizo affective schizophrenia (HCC)   . Thyroid disease     Patient Active Problem List   Diagnosis Date Noted  . Fall 03/27/2017  . Long term (current) use of opiate analgesic 06/26/2016  . Long term prescription opiate use 06/26/2016  . Opiate use 06/26/2016  . Obesity 01/17/2016  . History of opioid abuse (HCC) 01/17/2016  . Asthma 01/17/2016  . Mixed hyperlipidemia 10/09/2015  . Hypokalemia 10/09/2015  . Headache above the eye region 06/16/2013  . Edema extremities 05/18/2013  . 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 GERD (gastroesophageal reflux disease) 04/11/2013  . Personality disorder (HCC) 10/16/2012  . Chronic pain syndrome 06/18/2012  . S/P ankle fusion 05/21/2012  . Schizoaffective disorder, bipolar type (HCC) 02/20/2012  . 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 Hypothyroidism 11/16/2002    Past Surgical History:  Procedure Laterality Date  . ABDOMINAL HYSTERECTOMY    . ANKLE  SURGERY     pt. states long time ago    Allergies Codeine; Sulfa antibiotics; Penicillins; and Aspirin  Social History Social History   Tobacco Use  . Smoking status: Never Smoker  . Smokeless tobacco: Never Used  Substance Use Topics  . Alcohol use: No  . Drug use: No   Review of Systems Constitutional: Negative for fever. Cardiovascular: Positive for chest pain Respiratory: Shortness of breath Gastrointestinal: Negative for abdominal pain, vomiting and diarrhea. Musculoskeletal: Negative for back pain. Skin: Positive for skin lesions Neurological: Negative for headaches, focal weakness or numbness.  All systems negative/normal/unremarkable except as stated in the HPI  ____________________________________________   PHYSICAL EXAM:  VITAL SIGNS: ED Triage Vitals [02/12/18 1530]  Enc Vitals Group     BP      Pulse      Resp      Temp      Temp src      SpO2      Weight 280 lb (127 kg)     Height 5' (1.524 m)     Head Circumference      Peak Flow      Pain Score 7     Pain Loc      Pain Edu?      Excl. in GC?     Constitutional: Alert and oriented.  Anxious, no distress Eyes: Conjunctivae are normal. Normal extraocular movements. ENT      Head: Normocephalic and atraumatic.      Nose: No congestion/rhinnorhea.      Mouth/Throat:  Mucous membranes are moist.      Neck: No stridor. Cardiovascular: Normal rate, regular rhythm. No murmurs, rubs, or gallops. Respiratory: Normal respiratory effort without tachypnea nor retractions. Breath sounds are clear and equal bilaterally. No wheezes/rales/rhonchi. Gastrointestinal: Soft and nontender. Normal bowel sounds Musculoskeletal: Nontender with normal range of motion in extremities.  Peripheral edema is noted Neurologic:  Normal speech and language. No gross focal neurologic deficits are appreciated.  Skin:  Skin is warm, dry and intact. No rash noted. Psychiatric: Mood and affect are normal. Speech and behavior  are normal.  ____________________________________________  EKG: Interpreted by me.  Sinus rhythm rate of 77 bpm, LVH, normal axis, normal QT  ____________________________________________  ED COURSE:  As part of my medical decision making, I reviewed the following data within the electronic MEDICAL RECORD NUMBER History obtained from family if available, nursing notes, old chart and ekg, as well as notes from prior ED visits. Patient presented for chest pain, we will assess with labs and imaging as indicated at this time.   Procedures ____________________________________________   LABS (pertinent positives/negatives)  Labs Reviewed  BASIC METABOLIC PANEL - Abnormal; Notable for the following components:      Result Value   Glucose, Bld 105 (*)    All other components within normal limits  CBC - Abnormal; Notable for the following components:   WBC 11.4 (*)    Hemoglobin 11.7 (*)    MCH 24.5 (*)    MCHC 29.4 (*)    RDW 17.2 (*)    Platelets 534 (*)    nRBC 0.3 (*)    All other components within normal limits  FIBRIN DERIVATIVES D-DIMER ([facility] ONLY) - Abnormal; Notable for the following components:   Fibrin derivatives D-dimer ([facility]) 832.28 (*)    All other components within normal limits  TROPONIN I  TROPONIN I    RADIOLOGY Images were viewed by me  Chest x-ray/CTA  IMPRESSION: 1. No pulmonary emboli. 2. Increased prominence of axillary and mediastinal lymph nodes. These nodes are nonspecific but could be reactive. Recommend a follow-up CT scan in 3-6 months for follow-up. 3. Postoperative changes in the anterior right chest. ____________________________________________   DIFFERENTIAL DIAGNOSIS   Musculoskeletal pain, GERD, anxiety, unstable angina, PE  FINAL ASSESSMENT AND PLAN  Chest pain   Plan: The patient had presented for nonspecific chest pain. Patient's labs are unremarkable with exception of an elevated d-dimer. Patient's imaging revealed lymph nodes that  were enlarged but no other acute process.  She is cleared for outpatient follow-up and has been advised to follow-up in 6 months for repeat CT imaging   Ulice DashJohnathan E , MD    Note: This note was generated in part or whole with voice recognition software. Voice recognition is usually quite accurate but there are transcription errors that can and very often do occur. I apologize for any typographical errors that were not detected and corrected.     Emily Filbert,  E, MD 02/12/18 1901

## 2018-02-23 ENCOUNTER — Emergency Department: Payer: Medicaid Other

## 2018-02-23 ENCOUNTER — Other Ambulatory Visit: Payer: Self-pay

## 2018-02-23 ENCOUNTER — Emergency Department
Admission: EM | Admit: 2018-02-23 | Discharge: 2018-02-23 | Disposition: A | Discharge status: Home / Self Care | Payer: Medicaid Other | Attending: Emergency Medicine | Admitting: Emergency Medicine

## 2018-02-23 DIAGNOSIS — F319 Bipolar disorder, unspecified: Secondary | ICD-10-CM | POA: Insufficient documentation

## 2018-02-23 DIAGNOSIS — F419 Anxiety disorder, unspecified: Secondary | ICD-10-CM | POA: Diagnosis not present

## 2018-02-23 DIAGNOSIS — J45909 Unspecified asthma, uncomplicated: Secondary | ICD-10-CM | POA: Insufficient documentation

## 2018-02-23 DIAGNOSIS — T7840XA Allergy, unspecified, initial encounter: Secondary | ICD-10-CM | POA: Diagnosis not present

## 2018-02-23 DIAGNOSIS — Z79899 Other long term (current) drug therapy: Secondary | ICD-10-CM | POA: Insufficient documentation

## 2018-02-23 DIAGNOSIS — T783XXA Angioneurotic edema, initial encounter: Secondary | ICD-10-CM | POA: Diagnosis not present

## 2018-02-23 DIAGNOSIS — F259 Schizoaffective disorder, unspecified: Secondary | ICD-10-CM | POA: Diagnosis not present

## 2018-02-23 DIAGNOSIS — E039 Hypothyroidism, unspecified: Secondary | ICD-10-CM | POA: Insufficient documentation

## 2018-02-23 DIAGNOSIS — R079 Chest pain, unspecified: Secondary | ICD-10-CM

## 2018-02-23 DIAGNOSIS — F1190 Opioid use, unspecified, uncomplicated: Secondary | ICD-10-CM | POA: Insufficient documentation

## 2018-02-23 LAB — CBC
HCT: 42.1 % (ref 36.0–46.0)
Hemoglobin: 12.7 g/dL (ref 12.0–15.0)
MCH: 24.9 pg — ABNORMAL LOW (ref 26.0–34.0)
MCHC: 30.2 g/dL (ref 30.0–36.0)
MCV: 82.4 fL (ref 80.0–100.0)
NRBC: 0.3 % — AB (ref 0.0–0.2)
PLATELETS: 436 10*3/uL — AB (ref 150–400)
RBC: 5.11 MIL/uL (ref 3.87–5.11)
RDW: 17.3 % — ABNORMAL HIGH (ref 11.5–15.5)
WBC: 9.7 10*3/uL (ref 4.0–10.5)

## 2018-02-23 LAB — BASIC METABOLIC PANEL
ANION GAP: 8 (ref 5–15)
BUN: 11 mg/dL (ref 6–20)
CALCIUM: 9.3 mg/dL (ref 8.9–10.3)
CHLORIDE: 101 mmol/L (ref 98–111)
CO2: 28 mmol/L (ref 22–32)
Creatinine, Ser: 0.83 mg/dL (ref 0.44–1.00)
GFR calc non Af Amer: >60 mL/min (ref >60)
Glucose, Bld: 122 mg/dL — ABNORMAL HIGH (ref 70–99)
Potassium: 3.9 mmol/L (ref 3.5–5.1)
Sodium: 137 mmol/L (ref 135–145)

## 2018-02-23 LAB — TROPONIN I: Troponin I: <0.03 ng/mL (ref <0.03)

## 2018-02-23 MED ORDER — PREDNISONE 50 MG PO TABS: ORAL_TABLET | ORAL | 0 refills | Status: DC

## 2018-02-23 MED ORDER — EPINEPHRINE 0.3 MG/0.3ML IJ SOAJ: 0.3000 mg | INTRAMUSCULAR | 0 refills | Status: DC | PRN

## 2018-02-23 MED ORDER — METHYLPREDNISOLONE SODIUM SUCC 125 MG IJ SOLR
125.0000 mg | Freq: Once | INTRAMUSCULAR | Status: AC
  Administered 2018-02-23: 125 mg via INTRAVENOUS
  Filled 2018-02-23: qty 2

## 2018-02-23 MED ORDER — SODIUM CHLORIDE 0.9% FLUSH
3.0000 mL | Freq: Once | INTRAVENOUS | Status: AC
  Administered 2018-02-23: 3 mL via INTRAVENOUS

## 2018-02-23 MED ORDER — KETOROLAC TROMETHAMINE 30 MG/ML IJ SOLN
INTRAMUSCULAR | Status: AC
  Administered 2018-02-23: 15 mg
  Filled 2018-02-23: qty 1

## 2018-02-23 MED ORDER — FAMOTIDINE IN NACL 20-0.9 MG/50ML-% IV SOLN
20.0000 mg | Freq: Once | INTRAVENOUS | Status: AC
  Administered 2018-02-23: 20 mg via INTRAVENOUS
  Filled 2018-02-23: qty 50

## 2018-02-23 NOTE — ED Provider Notes (Signed)
[facility] Emergency Department Provider Note  ____________________________________________   First MD Initiated Contact with Patient 02/23/18 1750     (approximate)  I have reviewed the triage vital signs and the nursing notes.   HISTORY  Chief Complaint Facial Pain and Chest Pain   HPI Rhonda Palmer is a 48 y.o. female with a history of bipolar disorder as well as anxiety and asthma who presented emergency department today complaining of chest pain which is been intermittent over the past week as well as a rash to the left upper chest and left-sided facial swelling.  Patient says that the chest pain radiates through to her back.  Is not associate with shortness of breath, nausea vomiting or diaphoresis.  Does not worsen with deep breathing.  States that she then started to have facial swelling earlier today to the left side of her face.  Says that it is not itchy but painful.  Denies any new medications.  New soaps or detergents.   Past Medical History:  Diagnosis Date  . Anxiety   . Asthma   . Bipolar 1 disorder (HCC)   . Edema leg for twenty years per patient   taking lasix for edema  . Fibromyalgia   . Ovarian cyst   . Schizo affective schizophrenia (HCC)   . Thyroid disease     Patient Active Problem List   Diagnosis Date Noted  . Fall 03/27/2017  . Long term (current) use of opiate analgesic 06/26/2016  . Long term prescription opiate use 06/26/2016  . Opiate use 06/26/2016  . Obesity 01/17/2016  . History of opioid abuse (HCC) 01/17/2016  . Asthma 01/17/2016  . Mixed hyperlipidemia 10/09/2015  . Hypokalemia 10/09/2015  . Headache above the eye region 06/16/2013  . Edema extremities 05/18/2013  . 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 GERD (gastroesophageal reflux disease) 04/11/2013  . Personality disorder (HCC) 10/16/2012  . Chronic pain syndrome 06/18/2012  . S/P ankle fusion 05/21/2012  . Schizoaffective  disorder, bipolar type (HCC) 02/20/2012  . 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 Hypothyroidism 11/16/2002    Past Surgical History:  Procedure Laterality Date  . ABDOMINAL HYSTERECTOMY    . ANKLE SURGERY     pt. states long time ago    Prior to Admission medications   Medication Sig Start Date End Date Taking? Authorizing Provider  ARIPiprazole ER (ABILIFY MAINTENA) 300 MG SRER injection Inject 300 mg into the muscle every 28 (twenty-eight) days.   Yes [provider]  clonazePAM (KLONOPIN) 0.5 MG tablet Take 0.5 mg by mouth 2 (two) times daily. 02/18/18  Yes [provider]  furosemide (LASIX) 20 MG tablet Take 2 tablets (40 mg total) by mouth daily. 05/13/16  Yes Veronese, Washington, MD  gabapentin (NEURONTIN) 300 MG capsule Take 300 mg by mouth 3 (three) times daily. 12/16/17  Yes [provider]  JARDIANCE 25 MG TABS tablet Take 25 mg by mouth daily. 01/13/18  Yes [provider]  levothyroxine (SYNTHROID, LEVOTHROID) 100 MCG tablet Take 100 mcg by mouth daily before breakfast.   Yes [provider]  LINZESS 72 MCG capsule Take 72 mg by mouth daily. 01/21/18  Yes [provider]  mirtazapine (REMERON) 30 MG tablet TAKE 1 TABLET BY MOUTH AT BEDTIME AS NEEDED FOR SLEEP 02/13/16  Yes [provider]  ondansetron (ZOFRAN ODT) 4 MG disintegrating tablet Take 1 tablet (4  mg total) by mouth every 8 (eight) hours as needed for nausea or vomiting. 04/14/17  Yes Rebecka ApleyWebster, Allison P, MD  traMADol (ULTRAM) 50 MG tablet Take 0.5 tablets (25 mg total) by mouth every 6 (six) hours as needed. 06/10/17 06/10/18 Yes Enid DerryWagner, Ashley, PA-C  venlafaxine (EFFEXOR) 100 MG tablet Take 100 mg by mouth 2 (two) times daily.   Yes [provider]    Allergies Codeine; Sulfa antibiotics; Penicillins; and Aspirin  Family History  Problem Relation Age of  Onset  . Heart failure Mother   . Bladder Cancer Neg Hx   . Kidney cancer Neg Hx     Social History Social History   Tobacco Use  . Smoking status: Never Smoker  . Smokeless tobacco: Never Used  Substance Use Topics  . Alcohol use: No  . Drug use: No    Review of Systems  Constitutional: No fever/chills Eyes: No visual changes. ENT: No sore throat. Cardiovascular as above Respiratory: Denies shortness of breath. Gastrointestinal: No abdominal pain.  No nausea, no vomiting.  No diarrhea.  No constipation. Genitourinary: Negative for dysuria. Musculoskeletal: As above Skin: Negative for rash. Neurological: Negative for headaches, focal weakness or numbness.   ____________________________________________   PHYSICAL EXAM:  VITAL SIGNS: ED Triage Vitals  Enc Vitals Group     BP 02/23/18 1708 (!) 126/93     Pulse Rate 02/23/18 1708 84     Resp 02/23/18 1708 18     Temp 02/23/18 1708 98.2 F (36.8 C)     Temp Source 02/23/18 1708 Oral     SpO2 02/23/18 1708 100 %     Weight 02/23/18 1709 280 lb (127 kg)     Height 02/23/18 1709 5' (1.524 m)     Head Circumference --      Peak Flow --      Pain Score 02/23/18 1708 7     Pain Loc --      Pain Edu? --      Excl. in GC? --     Constitutional: Alert and oriented. Well appearing and in no acute distress. Eyes: Conjunctivae are normal.  Head: Atraumatic. Nose: No congestion/rhinnorhea. Mouth/Throat: Mucous membranes are moist.  No pharyngeal exudate or erythema.  Tenderness palpation along the anterior and left lateral gumline without any swelling or fluctuance or exudate.  There is also swelling to the left side of the upper lip extending onto the patient's left cheek and to the nose.  Mild tenderness to palpation here as well with mild erythema without any induration or exudate.  No fluctuance. Neck: No stridor.   Cardiovascular: Normal rate, regular rhythm. Grossly normal heart sounds.  Good peripheral circulation  with equal bilateral radial as well as dorsalis pedis pulses. Respiratory: Normal respiratory effort.  No retractions. Lungs CTAB. Gastrointestinal: Soft and nontender. No distention. No CVA tenderness. Musculoskeletal: No lower extremity tenderness nor edema.  No joint effusions. Neurologic:  Normal speech and language. No gross focal neurologic deficits are appreciated. Skin: Audible excoriations to the thoracic wall as well as the abdominal wall in multiple stages of healing.  No bullae.  No erythema, induration or exudate.  Also with what appears to be a patch of erythema to the left side of the sternum approximately 3 x 4 cm with some mild scaling.  No induration, exudate.  No tenderness to palpation.  Psychiatric: Mood and affect are normal. Speech and behavior are normal.  ____________________________________________   LABS (all labs ordered are listed, but only  abnormal results are displayed)  Labs Reviewed  BASIC METABOLIC PANEL - Abnormal; Notable for the following components:      Result Value   Glucose, Bld 122 (*)    All other components within normal limits  CBC - Abnormal; Notable for the following components:   MCH 24.9 (*)    RDW 17.3 (*)    Platelets 436 (*)    nRBC 0.3 (*)    All other components within normal limits  TROPONIN I   ____________________________________________  EKG  ED ECG REPORT I, Arelia Longestavid M Schaevitz, the attending physician, personally viewed and interpreted this ECG.   Date: 02/23/2018  EKG Time: 1707  Rate: 91  Rhythm: normal sinus rhythm  Axis: Normal  Intervals:none  ST&T Change: No ST segment elevation or depression.  No abnormal T wave inversion.  ____________________________________________  RADIOLOGY  No active cardiopulmonary disease on chest x-ray ____________________________________________   PROCEDURES  Procedure(s) performed:   Procedures  Critical Care performed:    ____________________________________________   INITIAL IMPRESSION / ASSESSMENT AND PLAN / ED COURSE  Pertinent labs & imaging results that were available during my care of the patient were reviewed by me and considered in my medical decision making (see chart for details).  Differential diagnosis includes, but is not limited to, ACS, aortic dissection, pulmonary embolism, cardiac tamponade, pneumothorax, pneumonia, pericarditis, myocarditis, GI-related causes including esophagitis/gastritis, and musculoskeletal chest wall pain.   As part of my medical decision making, I reviewed the following data within the electronic MEDICAL RECORD NUMBER Notes from prior ED visits  ----------------------------------------- 8:42 PM on 02/23/2018 -----------------------------------------  Patient now with swelling that is decreased.  Still not complaining of any shortness of breath or feeling of her throat closing.  Also with rash to left upper chest which is lighter/improved.  Patient still complaining of chest pain. PERC negative.  Unlikely to be dissection as the pain is been ongoing for a week and the patient appears quite well.  Patient now stating that she started new blood pressure medication approximate 1 week ago which I urged her to stop.  Possible allergic reaction from this.  Patient will be discharged with steroids as well as an EpiPen.  She knows to return for any worsening or returning symptoms.  Also I repalpated the patient's gumline and there is no tenderness there.  I believe this will reduce the probability that this is a dental infection and is most likely some sort of angioedema or allergic reaction.  The patient has been observed approximately 4 hours in the emergency department at this time without any airway involvement causing her throat to close or any respiratory distress.  She is improved and will be discharged home at this time.  ____________________________________________   FINAL  CLINICAL IMPRESSION(S) / ED DIAGNOSES  Angioedema.  Allergic reaction.  Chest pain.  NEW MEDICATIONS STARTED DURING THIS VISIT:  New Prescriptions   No medications on file     Note:  This document was prepared using Dragon voice recognition software and may include unintentional dictation errors.     Myrna BlazerSchaevitz, David Matthew, MD 02/23/18 2043

## 2018-02-23 NOTE — ED Triage Notes (Signed)
Pt c/o sudden onset sharp chest pain about 2-3 hrs PTA, states then her husband noticed swelling to the left side of the face. Pt has noted swelling to the upper lip and left side of face with some redness that is acute and painful . Pt also has a red blistered area to the upper chest that the pt reports itching for the past 3 weeks. While undressing the pt for ECG pt has noted multiple wounds to abd and chest that the pt states she picks at all the time.

## 2018-02-28 ENCOUNTER — Encounter: Payer: Self-pay | Admitting: Gynecology

## 2018-02-28 ENCOUNTER — Other Ambulatory Visit: Payer: Self-pay

## 2018-02-28 ENCOUNTER — Ambulatory Visit
Admission: EM | Admit: 2018-02-28 | Discharge: 2018-02-28 | Disposition: A | Discharge status: Home / Self Care | Payer: Medicaid Other | Attending: Family Medicine | Admitting: Family Medicine

## 2018-02-28 DIAGNOSIS — J029 Acute pharyngitis, unspecified: Secondary | ICD-10-CM

## 2018-02-28 DIAGNOSIS — R05 Cough: Secondary | ICD-10-CM | POA: Insufficient documentation

## 2018-02-28 DIAGNOSIS — R69 Illness, unspecified: Secondary | ICD-10-CM | POA: Diagnosis not present

## 2018-02-28 DIAGNOSIS — R059 Cough, unspecified: Secondary | ICD-10-CM

## 2018-02-28 DIAGNOSIS — J111 Influenza due to unidentified influenza virus with other respiratory manifestations: Secondary | ICD-10-CM

## 2018-02-28 LAB — RAPID INFLUENZA A&B ANTIGENS
Influenza A (ARMC): NEGATIVE
Influenza B (ARMC): NEGATIVE

## 2018-02-28 LAB — RAPID STREP SCREEN (MED CTR MEBANE ONLY): Streptococcus, Group A Screen (Direct): NEGATIVE

## 2018-02-28 MED ORDER — OSELTAMIVIR PHOSPHATE 75 MG PO CAPS: 75.0000 mg | ORAL_CAPSULE | Freq: Two times a day (BID) | ORAL | 0 refills | Status: AC

## 2018-02-28 MED ORDER — BENZONATATE 100 MG PO CAPS: 100.0000 mg | ORAL_CAPSULE | Freq: Three times a day (TID) | ORAL | 0 refills | Status: DC | PRN

## 2018-02-28 NOTE — ED Triage Notes (Signed)
Per patient with chills/ cough / sore throat.

## 2018-02-28 NOTE — Discharge Instructions (Addendum)
Recommend start Tamiflu 75mg  twice a day as directed. May take Tessalon cough pills 1 every 8 hours as needed. Increase fluids to help loosen up mucus. May use Albuterol inhaler 2 puffs every 6 hours as needed for cough and wheezing. May continue Advil as directed for pain or fever. Follow-up with your PCP in 3 to 4 days if not improving.

## 2018-03-01 NOTE — ED Provider Notes (Signed)
[facility]    CSN: [id number] Arrival date & time: 02/28/18  1302     History   Chief Complaint No chief complaint on file.   HPI Tonny Bollmanara Lise Frank is a 48 y.o. female.   48 year old female presents with chills, fever, cough, sore throat, runny nose, and nausea that started 2 to 3 days ago. Also having difficulty sleeping due to cough. Has taken Advil cold medication with minimal relief. Here with daughter who also has similar symptoms. Relative dx with pneumonia 5 to 6 days ago. Did not have the flu vaccine this season. Has history of multiple chronic health issues including anxiety, depression, bipolar disorder, schizophrenia, fibromyalgia, thyroid disorder, intestinal disorder, asthma and peripheral edema and currently on Lasix, Effexor, Abilify, Neurontin, Remeron, Jardiance, Klonopin, Tramadol, Linzess, and Synthroid daily and Zofran and Albuterol prn. Recently seen in the ER on 02/22/18 for possible allergic reaction and currently on Prednisone daily.   The history is provided by the patient.    Past Medical History:  Diagnosis Date  . Anxiety   . Asthma   . Bipolar 1 disorder (HCC)   . Edema leg for twenty years per patient   taking lasix for edema  . Fibromyalgia   . Ovarian cyst   . Schizo affective schizophrenia (HCC)   . Thyroid disease     Patient Active Problem List   Diagnosis Date Noted  . Fall 03/27/2017  . Long term (current) use of opiate analgesic 06/26/2016  . Long term prescription opiate use 06/26/2016  . Opiate use 06/26/2016  . Obesity 01/17/2016  . History of opioid abuse (HCC) 01/17/2016  . Asthma 01/17/2016  . Mixed hyperlipidemia 10/09/2015  . Hypokalemia 10/09/2015  . Headache above the eye region 06/16/2013  . Edema extremities 05/18/2013  . 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 GERD (gastroesophageal reflux disease) 04/11/2013  . Personality disorder (HCC) 10/16/2012  . Chronic pain syndrome 06/18/2012  . S/P  ankle fusion 05/21/2012  . Schizoaffective disorder, bipolar type (HCC) 02/20/2012  . 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 Hypothyroidism 11/16/2002    Past Surgical History:  Procedure Laterality Date  . ABDOMINAL HYSTERECTOMY    . ANKLE SURGERY     pt. states long time ago    OB History    Gravida  3   Para  2   Term  2   Preterm      AB      Living  2     SAB      TAB      Ectopic      Multiple      Live Births               Home Medications    Prior to Admission medications   Medication Sig Start Date End Date Taking? Authorizing Provider  albuterol (PROVENTIL HFA;VENTOLIN HFA) 108 (90 Base) MCG/ACT inhaler Inhale 2 puffs into the lungs every 6 (six) hours as needed for wheezing or shortness of breath.   Yes [provider]  ARIPiprazole ER (ABILIFY MAINTENA) 300 MG SRER injection Inject 300 mg into the muscle every 28 (twenty-eight) days.   Yes [provider]  clonazePAM (KLONOPIN) 0.5 MG tablet Take 0.5 mg by mouth 2 (two) times daily. 02/18/18  Yes [provider]  EPINEPHrine (EPIPEN 2-PAK) 0.3 mg/0.3 mL IJ SOAJ injection Inject 0.3 mLs (0.3 mg  total) into the muscle as needed for anaphylaxis. 02/23/18  Yes Schaevitz, Myra Rude, MD  furosemide (LASIX) 20 MG tablet Take 2 tablets (40 mg total) by mouth daily. 05/13/16  Yes Veronese, [place], MD  gabapentin (NEURONTIN) 300 MG capsule Take 300 mg by mouth 3 (three) times daily. 12/16/17  Yes [provider]  JARDIANCE 25 MG TABS tablet Take 25 mg by mouth daily. 01/13/18  Yes [provider]  levothyroxine (SYNTHROID, LEVOTHROID) 100 MCG tablet Take 100 mcg by mouth daily before breakfast.   Yes [provider]  LINZESS 72 MCG capsule Take 72 mg by mouth daily. 01/21/18  Yes [provider]  mirtazapine (REMERON) 30 MG tablet TAKE 1 TABLET BY  MOUTH AT BEDTIME AS NEEDED FOR SLEEP 02/13/16  Yes [provider]  ondansetron (ZOFRAN ODT) 4 MG disintegrating tablet Take 1 tablet (4 mg total) by mouth every 8 (eight) hours as needed for nausea or vomiting. 04/14/17  Yes Rebecka Apley, MD  traMADol (ULTRAM) 50 MG tablet Take 0.5 tablets (25 mg total) by mouth every 6 (six) hours as needed. 06/10/17 06/10/18 Yes Enid Derry, PA-C  venlafaxine (EFFEXOR) 100 MG tablet Take 100 mg by mouth 2 (two) times daily.   Yes [provider]  benzonatate (TESSALON) 100 MG capsule Take 1 capsule (100 mg total) by mouth 3 (three) times daily as needed for cough. 02/28/18   Sudie Grumbling, NP  oseltamivir (TAMIFLU) 75 MG capsule Take 1 capsule (75 mg total) by mouth every 12 (twelve) hours for 5 days. 02/28/18 03/05/18  Sudie Grumbling, NP    Family History Family History  Problem Relation Age of Onset  . Heart failure Mother   . Bladder Cancer Neg Hx   . Kidney cancer Neg Hx     Social History Social History   Tobacco Use  . Smoking status: Never Smoker  . Smokeless tobacco: Never Used  Substance Use Topics  . Alcohol use: No  . Drug use: No     Allergies   Codeine; Sulfa antibiotics; Penicillins; and Aspirin   Review of Systems Review of Systems  Constitutional: Positive for activity change, appetite change, chills, fatigue and fever.  HENT: Positive for congestion, postnasal drip, rhinorrhea, sinus pressure, sinus pain, sore throat and trouble swallowing. Negative for ear discharge, ear pain, facial swelling, mouth sores, nosebleeds and sneezing.   Eyes: Negative for pain, discharge, redness and itching.  Respiratory: Positive for cough, chest tightness, shortness of breath and wheezing.   Gastrointestinal: Positive for diarrhea and nausea. Negative for abdominal pain and vomiting.  Musculoskeletal: Positive for arthralgias, back pain and myalgias. Negative for neck pain and neck stiffness.  Skin: Negative for color  change, rash and wound.  Neurological: Positive for headaches. Negative for dizziness, tremors, seizures, syncope, weakness, light-headedness and numbness.  Hematological: Negative for adenopathy. Does not bruise/bleed easily.  Psychiatric/Behavioral: Positive for dysphoric mood and sleep disturbance.     Physical Exam Triage Vital Signs ED Triage Vitals  Enc Vitals Group     BP 02/28/18 1335 130/65     Pulse Rate 02/28/18 1335 (!) 106     Resp 02/28/18 1335 18     Temp 02/28/18 1335 99.7 F (37.6 C)     Temp Source 02/28/18 1335 Oral     SpO2 02/28/18 1335 96 %     Weight 02/28/18 1334 280 lb (127 kg)     Height --      Head Circumference --  Peak Flow --      Pain Score 02/28/18 1334 6     Pain Loc --      Pain Edu? --      Excl. in GC? --    No data found.  Updated Vital Signs BP 130/65 (BP Location: Left Arm)   Pulse (!) 106   Temp 99.7 F (37.6 C) (Oral)   Resp 18   Wt 280 lb (127 kg)   SpO2 96%   BMI 54.68 kg/m   Visual Acuity Right Eye Distance:   Left Eye Distance:   Bilateral Distance:    Right Eye Near:   Left Eye Near:    Bilateral Near:     Physical Exam Vitals signs and nursing note reviewed.  Constitutional:      General: She is awake. She is not in acute distress.    Appearance: She is well-developed and overweight. She is ill-appearing.     Comments: Patient sitting in exam chair in no acute distress but appear ill.   HENT:     Head: Normocephalic and atraumatic.     Right Ear: Hearing, tympanic membrane, ear canal and external ear normal.     Left Ear: Hearing, tympanic membrane, ear canal and external ear normal.     Nose: Mucosal edema and congestion present.     Right Sinus: Maxillary sinus tenderness and frontal sinus tenderness present.     Left Sinus: Maxillary sinus tenderness and frontal sinus tenderness present.     Mouth/Throat:     Lips: Pink.     Mouth: Mucous membranes are moist.     Pharynx: Uvula midline. Posterior  oropharyngeal erythema present. No pharyngeal swelling or oropharyngeal exudate.  Eyes:     Extraocular Movements: Extraocular movements intact.     Conjunctiva/sclera: Conjunctivae normal.  Neck:     Musculoskeletal: Normal range of motion and neck supple. Muscular tenderness (along cervical lymph nodes bilaterally) present.  Cardiovascular:     Rate and Rhythm: Regular rhythm. Tachycardia present.     Heart sounds: Normal heart sounds. No murmur.  Pulmonary:     Effort: Pulmonary effort is normal. No respiratory distress.     Breath sounds: Normal air entry. No decreased air movement. Examination of the right-upper field reveals wheezing. Examination of the left-upper field reveals wheezing. Examination of the right-lower field reveals wheezing. Examination of the left-lower field reveals wheezing. Wheezing present. No decreased breath sounds, rhonchi or rales.  Musculoskeletal: Normal range of motion.  Lymphadenopathy:     Cervical: No cervical adenopathy.  Skin:    General: Skin is warm and dry.     Capillary Refill: Capillary refill takes less than 2 seconds.  Neurological:     General: No focal deficit present.     Mental Status: She is alert and oriented to person, place, and time.  Psychiatric:        Attention and Perception: Attention normal.        Mood and Affect: Mood normal.        Speech: Speech normal.        Behavior: Behavior is slowed. Behavior is cooperative.      UC Treatments / Results  Labs (all labs ordered are listed, but only abnormal results are displayed) Labs Reviewed  RAPID INFLUENZA A&B ANTIGENS ([facility] ONLY)  RAPID STREP SCREEN ([facility] ONLY)  CULTURE, GROUP A STREP [facility])    EKG None  Radiology No results found.  Procedures Procedures (including critical care time)  Medications Ordered in UC Medications - No data to display  Initial Impression / Assessment and Plan / UC Course  I have reviewed the triage vital signs and the  nursing notes.  Pertinent labs & imaging results that were available during my care of the patient were reviewed by me and considered in my medical decision making (see chart for details).    Reviewed negative rapid strep test results and negative rapid influenza test results with patient. Discussed that influenza dx based upon history, clinical findings and community presence. Discussed that she probably has influenza. Patient desires anti-viral medications. Start Tamiflu 75mg  twice a day as directed. May take Tessalon cough pills 1 every 8 hours as needed. Continue to increase fluids to help loosen up mucus. May use Albuterol inhaler 2 puffs every 6 hours as needed for cough and wheezing. May continue Advil as directed for pain or fever. Follow-up with her PCP in 3 to 4 days if not improving.   Final Clinical Impressions(s) / UC Diagnoses   Final diagnoses:  Influenza-like illness  Sore throat  Cough     Discharge Instructions     Recommend start Tamiflu 75mg  twice a day as directed. May take Tessalon cough pills 1 every 8 hours as needed. Increase fluids to help loosen up mucus. May use Albuterol inhaler 2 puffs every 6 hours as needed for cough and wheezing. May continue Advil as directed for pain or fever. Follow-up with your PCP in 3 to 4 days if not improving.     ED Prescriptions    Medication Sig Dispense Auth. Provider   oseltamivir (TAMIFLU) 75 MG capsule Take 1 capsule (75 mg total) by mouth every 12 (twelve) hours for 5 days. 10 capsule Sudie Grumbling, NP   benzonatate (TESSALON) 100 MG capsule Take 1 capsule (100 mg total) by mouth 3 (three) times daily as needed for cough. 21 capsule Sudie Grumbling, NP     Controlled Substance Prescriptions [place] Controlled Substance Registry consulted? Not Applicable   Sudie Grumbling, NP 03/01/18 1228

## 2018-03-03 LAB — CULTURE, GROUP A STREP (THRC)

## 2018-05-19 ENCOUNTER — Other Ambulatory Visit: Payer: Self-pay

## 2018-05-19 ENCOUNTER — Emergency Department: Payer: Medicaid Other

## 2018-05-19 ENCOUNTER — Emergency Department
Admission: EM | Admit: 2018-05-19 | Discharge: 2018-05-19 | Disposition: A | Discharge status: Home / Self Care | Payer: Medicaid Other | Attending: Emergency Medicine | Admitting: Emergency Medicine

## 2018-05-19 DIAGNOSIS — Y93I9 Activity, other involving external motion: Secondary | ICD-10-CM | POA: Insufficient documentation

## 2018-05-19 DIAGNOSIS — M7918 Myalgia, other site: Secondary | ICD-10-CM

## 2018-05-19 DIAGNOSIS — S299XXA Unspecified injury of thorax, initial encounter: Secondary | ICD-10-CM | POA: Diagnosis not present

## 2018-05-19 DIAGNOSIS — Y998 Other external cause status: Secondary | ICD-10-CM | POA: Diagnosis not present

## 2018-05-19 DIAGNOSIS — Y92410 Unspecified street and highway as the place of occurrence of the external cause: Secondary | ICD-10-CM | POA: Diagnosis not present

## 2018-05-19 DIAGNOSIS — Z79899 Other long term (current) drug therapy: Secondary | ICD-10-CM | POA: Diagnosis not present

## 2018-05-19 DIAGNOSIS — S0990XA Unspecified injury of head, initial encounter: Secondary | ICD-10-CM | POA: Insufficient documentation

## 2018-05-19 MED ORDER — CYCLOBENZAPRINE HCL 5 MG PO TABS: ORAL_TABLET | ORAL | 0 refills | Status: DC

## 2018-05-19 MED ORDER — LIDOCAINE 5 % EX PTCH: 1.0000 | MEDICATED_PATCH | CUTANEOUS | 0 refills | Status: DC

## 2018-05-19 MED ORDER — KETOROLAC TROMETHAMINE 10 MG PO TABS: 10.0000 mg | ORAL_TABLET | Freq: Four times a day (QID) | ORAL | 0 refills | Status: DC | PRN

## 2018-05-19 MED ORDER — KETOROLAC TROMETHAMINE 30 MG/ML IJ SOLN
30.0000 mg | Freq: Once | INTRAMUSCULAR | Status: AC
  Administered 2018-05-19: 30 mg via INTRAMUSCULAR
  Filled 2018-05-19: qty 1
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
## 2018-05-19 NOTE — Discharge Instructions (Signed)
Please call primary care for an appointment early next week for reevaluation.  Please take Toradol for inflammation and Flexeril to help relax your muscles.  Do not take any additional ibuprofen.  You can take additional Tylenol as needed.  Your x-rays and CT are reassuring.  Your x-ray does not show any broken ribs.

## 2018-05-19 NOTE — ED Notes (Signed)
Patient transported to X-ray 
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
## 2018-06-22 ENCOUNTER — Other Ambulatory Visit: Payer: Self-pay | Admitting: Orthopedic Surgery

## 2018-06-22 DIAGNOSIS — M25552 Pain in left hip: Secondary | ICD-10-CM

## 2018-07-06 ENCOUNTER — Ambulatory Visit: Admission: RE | Admit: 2018-07-06 | Payer: Medicaid Other | Source: Ambulatory Visit

## 2018-07-22 ENCOUNTER — Ambulatory Visit: Admission: RE | Admit: 2018-07-22 | Payer: Medicaid Other | Source: Ambulatory Visit

## 2018-08-09 ENCOUNTER — Ambulatory Visit: Admission: RE | Admit: 2018-08-09 | Payer: Medicaid Other | Source: Ambulatory Visit

## 2018-08-20 ENCOUNTER — Ambulatory Visit: Admission: RE | Admit: 2018-08-20 | Payer: Medicaid Other | Source: Ambulatory Visit

## 2018-09-02 ENCOUNTER — Ambulatory Visit: Admission: RE | Admit: 2018-09-02 | Payer: Medicaid Other | Source: Ambulatory Visit

## 2018-09-27 ENCOUNTER — Ambulatory Visit: Payer: Medicaid Other

## 2018-10-07 ENCOUNTER — Ambulatory Visit: Payer: Medicaid Other

## 2018-10-12 ENCOUNTER — Emergency Department
Admission: EM | Admit: 2018-10-12 | Discharge: 2018-10-12 | Disposition: A | Discharge status: Home / Self Care | Payer: Medicaid Other | Attending: Emergency Medicine | Admitting: Emergency Medicine

## 2018-10-12 ENCOUNTER — Other Ambulatory Visit: Payer: Self-pay

## 2018-10-12 ENCOUNTER — Encounter: Payer: Self-pay | Admitting: Emergency Medicine

## 2018-10-12 DIAGNOSIS — Z7984 Long term (current) use of oral hypoglycemic drugs: Secondary | ICD-10-CM | POA: Diagnosis not present

## 2018-10-12 DIAGNOSIS — Z79899 Other long term (current) drug therapy: Secondary | ICD-10-CM | POA: Insufficient documentation

## 2018-10-12 DIAGNOSIS — K0889 Other specified disorders of teeth and supporting structures: Secondary | ICD-10-CM | POA: Diagnosis present

## 2018-10-12 DIAGNOSIS — E039 Hypothyroidism, unspecified: Secondary | ICD-10-CM | POA: Insufficient documentation

## 2018-10-12 DIAGNOSIS — J45909 Unspecified asthma, uncomplicated: Secondary | ICD-10-CM | POA: Insufficient documentation

## 2018-10-12 MED ORDER — LIDOCAINE VISCOUS HCL 2 % MT SOLN
15.0000 mL | Freq: Once | OROMUCOSAL | Status: AC
  Administered 2018-10-12: 15 mL via OROMUCOSAL
  Filled 2018-10-12: qty 15

## 2018-10-12 MED ORDER — OXYCODONE-ACETAMINOPHEN 5-325 MG PO TABS: 1.0000 | ORAL_TABLET | ORAL | 0 refills | Status: AC | PRN

## 2018-10-12 NOTE — ED Provider Notes (Signed)
[facility] Emergency Department Provider Note   ____________________________________________   First MD Initiated Contact with Patient 10/12/18 0725     (approximate)  I have reviewed the triage vital signs and the nursing notes.   HISTORY  Chief Complaint Dental Pain    HPI Rhonda Palmer is a 48 y.o. female patient complains of dental pain status post extraction 4 days ago.   Patient has left voice messages with her dentist.  They have not replied.  Patient state no edema.  Patient rates the pain 8/10.  Patient described the pain is "aching".  No palliative measure for complaint.      Past Medical History:  Diagnosis Date   Anxiety    Asthma    Bipolar 1 disorder (HCC)    Edema leg for twenty years per patient   taking lasix for edema   Fibromyalgia    Ovarian cyst    Schizo affective schizophrenia (HCC)    Thyroid disease     Patient Active Problem List   Diagnosis Date Noted   Fall 03/27/2017   Long term (current) use of opiate analgesic 06/26/2016   Long term prescription opiate use 06/26/2016   Opiate use 06/26/2016   Obesity 01/17/2016   History of opioid abuse (HCC) 01/17/2016   Asthma 01/17/2016   Mixed hyperlipidemia 10/09/2015   Hypokalemia 10/09/2015   Headache above the eye region 06/16/2013   Edema extremities 05/18/2013   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 states long time ago    Prior to  Admission medications   Medication Sig Start Date End Date Taking? Authorizing Provider  torsemide (DEMADEX) 20 MG tablet Take 20 mg by mouth daily.   Yes [provider]  albuterol (PROVENTIL HFA;VENTOLIN HFA) 108 (90 Base) MCG/ACT inhaler Inhale 2 puffs into the lungs every 6 (six) hours as needed for wheezing or shortness of breath.    [provider]  ARIPiprazole ER (ABILIFY MAINTENA) 300 MG SRER injection Inject 300 mg into the muscle every 28 (twenty-eight) days.    [provider]  clonazePAM (KLONOPIN) 0.5 MG tablet Take 0.5 mg by mouth 2 (two) times daily. 02/18/18   [provider]  EPINEPHrine (EPIPEN 2-PAK) 0.3 mg/0.3 mL IJ SOAJ injection Inject 0.3 mLs (0.3 mg total) into the muscle as needed for anaphylaxis. 02/23/18   Schaevitz, Myra Rude, MD  furosemide (LASIX) 20 MG tablet Take 2 tablets (40 mg total) by mouth daily. 05/13/16   Nita Sickle, MD  gabapentin (NEURONTIN) 300 MG capsule Take 300 mg by mouth 3 (three) times daily. 12/16/17   [provider]  JARDIANCE 25 MG TABS tablet Take 25 mg by mouth daily. 01/13/18   [provider]  levothyroxine (SYNTHROID, LEVOTHROID) 100 MCG tablet Take 100 mcg by mouth daily before breakfast.    [provider]  LINZESS 72 MCG capsule Take 72 mg by mouth daily. 01/21/18   [provider]  mirtazapine (REMERON) 30 MG tablet TAKE 1 TABLET BY MOUTH AT BEDTIME  AS NEEDED FOR SLEEP 02/13/16   [provider]  oxyCODONE-acetaminophen (PERCOCET) 5-325 MG tablet Take 1 tablet by mouth every 4 (four) hours as needed for up to 5 days for severe pain. 10/12/18 10/17/18  Sable Feil, PA-C  venlafaxine (EFFEXOR) 100 MG tablet Take 100 mg by mouth 2 (two) times daily.    [provider]    Allergies Codeine, Sulfa antibiotics, Penicillins, and Aspirin  Family History  Problem Relation Age of Onset   Heart failure Mother    Bladder Cancer Neg Hx     Kidney cancer Neg Hx     Social History Social History   Tobacco Use   Smoking status: Never Smoker   Smokeless tobacco: Never Used  Substance Use Topics   Alcohol use: No   Drug use: No    Review of Systems Constitutional: No fever/chills Eyes: No visual changes. ENT: No sore throat.  Dental pain Cardiovascular: Denies chest pain. Respiratory: Denies shortness of breath. Gastrointestinal: No abdominal pain.  No nausea, no vomiting.  No diarrhea.  No constipation. Genitourinary: Negative for dysuria. Musculoskeletal: Negative for back pain. Skin: Negative for rash. Neurological: Negative for headaches, focal weakness or numbness. Psychiatric:  Anxiety, bipolar, schizophrenic. Allergic/Immunilogical: Codeine, aspirin, sulfa, and penicillin.  ____________________________________________   PHYSICAL EXAM:  VITAL SIGNS: ED Triage Vitals  Enc Vitals Group     BP 10/12/18 0719 (!) 140/99     Pulse Rate 10/12/18 0719 85     Resp 10/12/18 0719 18     Temp 10/12/18 0719 98.1 F (36.7 C)     Temp Source 10/12/18 0719 Oral     SpO2 10/12/18 0719 100 %     Weight 10/12/18 0713 300 lb (136.1 kg)     Height 10/12/18 0713 5\' 3"  (1.6 m)     Head Circumference --      Peak Flow --      Pain Score 10/12/18 0712 8     Pain Loc --      Pain Edu? --      Excl. in [place]? --    Constitutional: Alert and oriented. Well appearing and in no acute distress. Mouth/Throat: Mucous membranes are moist.  Oropharynx non-erythematous.  Right lower molar extraction. Neck: No cervical spine tenderness to palpation. Hematological/Lymphatic/Immunilogical: No cervical lymphadenopathy. Cardiovascular: Normal rate, regular rhythm. Grossly normal heart sounds.  Good peripheral circulation. Respiratory: Normal respiratory effort.  No retractions. Lungs CTAB.  ____________________________________________   LABS (all labs ordered are listed, but only abnormal results are displayed)  Labs Reviewed -  No data to display ____________________________________________  EKG   ____________________________________________  RADIOLOGY  ED MD interpretation:    Official radiology report(s): No results found.  ____________________________________________   PROCEDURES  Procedure(s) performed (including Critical Care):  Procedures   ____________________________________________   INITIAL IMPRESSION / ASSESSMENT AND PLAN / ED COURSE  As part of my medical decision making, I reviewed the following data within the [place] was evaluated in Emergency Department on 10/12/2018 for the symptoms described in the history of present illness. She was evaluated in the context of the global COVID-19 pandemic, which necessitated consideration that the patient might be at risk for infection with the SARS-CoV-2 virus that causes COVID-19. Institutional protocols and algorithms that pertain to the evaluation of patients at risk for COVID-19 are in a state of rapid change based on information released by regulatory bodies including the CDC  and federal and state organizations. These policies and algorithms were followed during the patient's care in the ED.  Dental pain status post extraction.  Patient advised to follow-up with treating dentist.  Patient given discharge care instructions and a prescription for 5 days Percocet.      ____________________________________________   FINAL CLINICAL IMPRESSION(S) / ED DIAGNOSES  Final diagnoses:  Pain, dental     ED Discharge Orders         Ordered    oxyCODONE-acetaminophen (PERCOCET) 5-325 MG tablet  Every 4 hours PRN     10/12/18 0730           Note:  This document was prepared using Dragon voice recognition software and may include unintentional dictation errors.    Joni ReiningSmith, Larence Thone K, PA-C 10/12/18 [id number]    Emily FilbertWilliams, Jonathan E, MD 10/12/18 [phone]

## 2018-10-12 NOTE — Discharge Instructions (Addendum)
Advised to follow-up with treating dental clinic.

## 2018-10-12 NOTE — ED Notes (Signed)
See triage note  Presents with increased dental pain  States she some dental work on Friday  having increased pain with some swelling

## 2018-10-12 NOTE — ED Triage Notes (Signed)
Pt states she had a tooth extracted on Friday and is having pain.

## 2018-10-31 ENCOUNTER — Other Ambulatory Visit: Payer: Self-pay

## 2018-10-31 ENCOUNTER — Ambulatory Visit
Admission: RE | Admit: 2018-10-31 | Discharge: 2018-10-31 | Disposition: A | Discharge status: Home / Self Care | Payer: Medicaid Other | Source: Ambulatory Visit | Attending: Orthopedic Surgery | Admitting: Orthopedic Surgery

## 2018-10-31 DIAGNOSIS — M25552 Pain in left hip: Secondary | ICD-10-CM

## 2018-11-26 ENCOUNTER — Emergency Department: Payer: Medicaid Other

## 2018-11-26 ENCOUNTER — Emergency Department
Admission: EM | Admit: 2018-11-26 | Discharge: 2018-11-26 | Disposition: A | Discharge status: Home / Self Care | Payer: Medicaid Other | Attending: Emergency Medicine | Admitting: Emergency Medicine

## 2018-11-26 ENCOUNTER — Other Ambulatory Visit: Payer: Self-pay

## 2018-11-26 DIAGNOSIS — K5900 Constipation, unspecified: Secondary | ICD-10-CM | POA: Insufficient documentation

## 2018-11-26 DIAGNOSIS — N135 Crossing vessel and stricture of ureter without hydronephrosis: Secondary | ICD-10-CM | POA: Insufficient documentation

## 2018-11-26 DIAGNOSIS — J45909 Unspecified asthma, uncomplicated: Secondary | ICD-10-CM | POA: Diagnosis not present

## 2018-11-26 DIAGNOSIS — Z79899 Other long term (current) drug therapy: Secondary | ICD-10-CM | POA: Insufficient documentation

## 2018-11-26 DIAGNOSIS — R1031 Right lower quadrant pain: Secondary | ICD-10-CM | POA: Diagnosis present

## 2018-11-26 DIAGNOSIS — E039 Hypothyroidism, unspecified: Secondary | ICD-10-CM | POA: Insufficient documentation

## 2018-11-26 LAB — URINALYSIS, COMPLETE (UACMP) WITH MICROSCOPIC
Bilirubin Urine: NEGATIVE
Glucose, UA: >=500 mg/dL — AB
Hgb urine dipstick: NEGATIVE
Ketones, ur: NEGATIVE mg/dL
Leukocytes,Ua: NEGATIVE
Nitrite: NEGATIVE
Protein, ur: NEGATIVE mg/dL
Specific Gravity, Urine: 1.007 (ref 1.005–1.030)
pH: 6 (ref 5.0–8.0)

## 2018-11-26 LAB — COMPREHENSIVE METABOLIC PANEL
ALT: 90 U/L — ABNORMAL HIGH (ref 0–44)
AST: 23 U/L (ref 15–41)
Albumin: 3.6 g/dL (ref 3.5–5.0)
Alkaline Phosphatase: 271 U/L — ABNORMAL HIGH (ref 38–126)
Anion gap: 12 (ref 5–15)
BUN: 11 mg/dL (ref 6–20)
CO2: 24 mmol/L (ref 22–32)
Calcium: 9.2 mg/dL (ref 8.9–10.3)
Chloride: 104 mmol/L (ref 98–111)
Creatinine, Ser: 0.73 mg/dL (ref 0.44–1.00)
GFR calc Af Amer: >60 mL/min (ref >60)
GFR calc non Af Amer: >60 mL/min (ref >60)
Glucose, Bld: 107 mg/dL — ABNORMAL HIGH (ref 70–99)
Potassium: 3.2 mmol/L — ABNORMAL LOW (ref 3.5–5.1)
Sodium: 140 mmol/L (ref 135–145)
Total Bilirubin: 0.4 mg/dL (ref 0.3–1.2)
Total Protein: 7.7 g/dL (ref 6.5–8.1)

## 2018-11-26 LAB — CBC
HCT: 41 % (ref 36.0–46.0)
Hemoglobin: 11.8 g/dL — ABNORMAL LOW (ref 12.0–15.0)
MCH: 23.1 pg — ABNORMAL LOW (ref 26.0–34.0)
MCHC: 28.8 g/dL — ABNORMAL LOW (ref 30.0–36.0)
MCV: 80.2 fL (ref 80.0–100.0)
Platelets: 341 10*3/uL (ref 150–400)
RBC: 5.11 MIL/uL (ref 3.87–5.11)
RDW: 20.4 % — ABNORMAL HIGH (ref 11.5–15.5)
WBC: 9.7 10*3/uL (ref 4.0–10.5)
nRBC: 0.3 % — ABNORMAL HIGH (ref 0.0–0.2)

## 2018-11-26 LAB — LIPASE, BLOOD: Lipase: 25 U/L (ref 11–51)

## 2018-11-26 MED ORDER — SODIUM CHLORIDE 0.9 % IV SOLN
1000.0000 mL | Freq: Once | INTRAVENOUS | Status: AC
  Administered 2018-11-26: 1000 mL via INTRAVENOUS

## 2018-11-26 MED ORDER — MORPHINE SULFATE (PF) 4 MG/ML IV SOLN
4.0000 mg | Freq: Once | INTRAVENOUS | Status: AC
  Administered 2018-11-26: 4 mg via INTRAVENOUS
  Filled 2018-11-26: qty 1

## 2018-11-26 MED ORDER — MAGNESIUM CITRATE PO SOLN
1.0000 | Freq: Once | ORAL | Status: AC
  Administered 2018-11-26: 1 via ORAL
  Filled 2018-11-26: qty 296

## 2018-11-26 MED ORDER — ONDANSETRON HCL 4 MG/2ML IJ SOLN
4.0000 mg | Freq: Once | INTRAMUSCULAR | Status: AC
  Administered 2018-11-26: 4 mg via INTRAVENOUS
  Filled 2018-11-26: qty 2

## 2018-11-26 MED ORDER — SODIUM CHLORIDE 0.9% FLUSH
3.0000 mL | Freq: Once | INTRAVENOUS | Status: AC
  Administered 2018-11-26: 3 mL via INTRAVENOUS

## 2018-11-26 MED ORDER — LACTULOSE 20 GM/30ML PO SOLN: 15.0000 mL | Freq: Every day | ORAL | 0 refills | Status: DC

## 2018-11-26 NOTE — ED Notes (Signed)
Pt otf for imaging 
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
## 2018-11-26 NOTE — ED Notes (Signed)
IV team at bedside 

## 2018-11-26 NOTE — ED Notes (Addendum)
Dr. Corky Downs notified pt c/o 7/10 pain in abdomen, pt states medication that was given earlier helped her pain but the pain has returned. IV team consult placed due to reports of multiple IV attempts by previous nurse and failed ultrasound IV by ER RN.

## 2018-11-26 NOTE — ED Notes (Signed)
Dr. Corky Downs made aware of patient's IV status
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
## 2018-11-26 NOTE — ED Notes (Signed)
PT spoke with daughter and daughter arranged for a friend to come and pick her up.

## 2018-12-04 ENCOUNTER — Emergency Department: Payer: Medicaid Other

## 2018-12-04 ENCOUNTER — Encounter: Payer: Self-pay | Admitting: Emergency Medicine

## 2018-12-04 ENCOUNTER — Other Ambulatory Visit: Payer: Self-pay

## 2018-12-04 ENCOUNTER — Emergency Department
Admission: EM | Admit: 2018-12-04 | Discharge: 2018-12-04 | Disposition: A | Discharge status: Home / Self Care | Payer: Medicaid Other | Attending: Emergency Medicine | Admitting: Emergency Medicine

## 2018-12-04 DIAGNOSIS — L03116 Cellulitis of left lower limb: Secondary | ICD-10-CM | POA: Diagnosis not present

## 2018-12-04 DIAGNOSIS — L0390 Cellulitis, unspecified: Secondary | ICD-10-CM

## 2018-12-04 DIAGNOSIS — J45909 Unspecified asthma, uncomplicated: Secondary | ICD-10-CM | POA: Insufficient documentation

## 2018-12-04 DIAGNOSIS — R4182 Altered mental status, unspecified: Secondary | ICD-10-CM | POA: Diagnosis not present

## 2018-12-04 DIAGNOSIS — Z79899 Other long term (current) drug therapy: Secondary | ICD-10-CM | POA: Diagnosis not present

## 2018-12-04 DIAGNOSIS — R2242 Localized swelling, mass and lump, left lower limb: Secondary | ICD-10-CM | POA: Diagnosis present

## 2018-12-04 DIAGNOSIS — E039 Hypothyroidism, unspecified: Secondary | ICD-10-CM | POA: Insufficient documentation

## 2018-12-04 LAB — COMPREHENSIVE METABOLIC PANEL
ALT: 19 U/L (ref 0–44)
AST: 15 U/L (ref 15–41)
Albumin: 3.6 g/dL (ref 3.5–5.0)
Alkaline Phosphatase: 155 U/L — ABNORMAL HIGH (ref 38–126)
Anion gap: 9 (ref 5–15)
BUN: 15 mg/dL (ref 6–20)
CO2: 27 mmol/L (ref 22–32)
Calcium: 9 mg/dL (ref 8.9–10.3)
Chloride: 103 mmol/L (ref 98–111)
Creatinine, Ser: 0.75 mg/dL (ref 0.44–1.00)
GFR calc Af Amer: >60 mL/min (ref >60)
GFR calc non Af Amer: >60 mL/min (ref >60)
Glucose, Bld: 124 mg/dL — ABNORMAL HIGH (ref 70–99)
Potassium: 4.1 mmol/L (ref 3.5–5.1)
Sodium: 139 mmol/L (ref 135–145)
Total Bilirubin: 0.6 mg/dL (ref 0.3–1.2)
Total Protein: 7.6 g/dL (ref 6.5–8.1)

## 2018-12-04 LAB — URINALYSIS, COMPLETE (UACMP) WITH MICROSCOPIC
Bilirubin Urine: NEGATIVE
Glucose, UA: >=500 mg/dL — AB
Hgb urine dipstick: NEGATIVE
Ketones, ur: NEGATIVE mg/dL
Leukocytes,Ua: NEGATIVE
Nitrite: NEGATIVE
Protein, ur: NEGATIVE mg/dL
Specific Gravity, Urine: 1.027 (ref 1.005–1.030)
pH: 6 (ref 5.0–8.0)

## 2018-12-04 LAB — CBC
HCT: 35.7 % — ABNORMAL LOW (ref 36.0–46.0)
Hemoglobin: 10.3 g/dL — ABNORMAL LOW (ref 12.0–15.0)
MCH: 23.3 pg — ABNORMAL LOW (ref 26.0–34.0)
MCHC: 28.9 g/dL — ABNORMAL LOW (ref 30.0–36.0)
MCV: 80.8 fL (ref 80.0–100.0)
Platelets: 397 10*3/uL (ref 150–400)
RBC: 4.42 MIL/uL (ref 3.87–5.11)
RDW: 19.2 % — ABNORMAL HIGH (ref 11.5–15.5)
WBC: 9.4 10*3/uL (ref 4.0–10.5)
nRBC: 0 % (ref 0.0–0.2)

## 2018-12-04 LAB — URINE DRUG SCREEN, QUALITATIVE (ARMC ONLY)
Amphetamines, Ur Screen: NOT DETECTED
Barbiturates, Ur Screen: NOT DETECTED
Benzodiazepine, Ur Scrn: POSITIVE — AB
Cannabinoid 50 Ng, Ur ~~LOC~~: NOT DETECTED
Cocaine Metabolite,Ur ~~LOC~~: NOT DETECTED
MDMA (Ecstasy)Ur Screen: NOT DETECTED
Methadone Scn, Ur: NOT DETECTED
Opiate, Ur Screen: NOT DETECTED
Phencyclidine (PCP) Ur S: NOT DETECTED
Tricyclic, Ur Screen: NOT DETECTED

## 2018-12-04 MED ORDER — DOXYCYCLINE HYCLATE 100 MG PO CAPS: 100.0000 mg | ORAL_CAPSULE | Freq: Two times a day (BID) | ORAL | 0 refills | Status: AC

## 2018-12-04 NOTE — ED Notes (Signed)
Pt requesting pain medications for her chronic leg pain. Is currently on tramadol for the pain. Pt is alert, when I pointed out she didn't know the day of the week (she said Sunday) her response was "well, its either sat or sun, I don't keep track". Put a hat in the toilet for a urine sample.
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
## 2018-12-04 NOTE — Discharge Instructions (Addendum)
Please seek medical attention for any high fevers, chest pain, shortness of breath, change in behavior, persistent vomiting, bloody stool or any other new or concerning symptoms.  

## 2018-12-04 NOTE — ED Notes (Signed)
Pt given cup of coke by this tech

## 2018-12-04 NOTE — ED Notes (Signed)
Pt request phone to call husband. Phone given. Pt wants to leave. Informs pt of delay and not ready to be discharged. Pt states "I'm ready to go home, my husband only has one time to come get me". Pain still ongoing in leg. Will notify RN.
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
## 2019-01-22 ENCOUNTER — Emergency Department: Payer: Medicaid Other

## 2019-01-22 ENCOUNTER — Emergency Department
Admission: EM | Admit: 2019-01-22 | Discharge: 2019-01-22 | Disposition: A | Discharge status: Home / Self Care | Payer: Medicaid Other | Attending: Emergency Medicine | Admitting: Emergency Medicine

## 2019-01-22 ENCOUNTER — Other Ambulatory Visit: Payer: Self-pay

## 2019-01-22 DIAGNOSIS — J45909 Unspecified asthma, uncomplicated: Secondary | ICD-10-CM | POA: Diagnosis not present

## 2019-01-22 DIAGNOSIS — Z79899 Other long term (current) drug therapy: Secondary | ICD-10-CM | POA: Diagnosis not present

## 2019-01-22 DIAGNOSIS — E039 Hypothyroidism, unspecified: Secondary | ICD-10-CM | POA: Insufficient documentation

## 2019-01-22 DIAGNOSIS — R0789 Other chest pain: Secondary | ICD-10-CM | POA: Diagnosis not present

## 2019-01-22 MED ORDER — CYCLOBENZAPRINE HCL 5 MG PO TABS: 5.0000 mg | ORAL_TABLET | Freq: Three times a day (TID) | ORAL | 0 refills | Status: DC | PRN

## 2019-01-22 MED ORDER — ORPHENADRINE CITRATE 30 MG/ML IJ SOLN
60.0000 mg | INTRAMUSCULAR | Status: AC
  Administered 2019-01-22: 16:00:00 60 mg via INTRAMUSCULAR
  Filled 2019-01-22: qty 2

## 2019-01-22 MED ORDER — KETOROLAC TROMETHAMINE 30 MG/ML IJ SOLN
30.0000 mg | Freq: Once | INTRAMUSCULAR | Status: AC
  Administered 2019-01-22: 16:00:00 30 mg via INTRAMUSCULAR
  Filled 2019-01-22: qty 1

## 2019-01-22 MED ORDER — LIDOCAINE 5 % EX PTCH: 1.0000 | MEDICATED_PATCH | CUTANEOUS | 0 refills | Status: AC

## 2019-01-22 MED ORDER — KETOROLAC TROMETHAMINE 10 MG PO TABS: 10.0000 mg | ORAL_TABLET | Freq: Three times a day (TID) | ORAL | 0 refills | Status: DC

## 2019-01-22 NOTE — ED Notes (Signed)
Pt to ED via [facility]. Pt rolled out of bed this morning and hit the left side of her ribs on the heater. Hurts to take a deep breath. Denies LOC or hitting head. Pt is in NAD.

## 2019-01-22 NOTE — ED Triage Notes (Signed)
States rolled out of bed and hit L ribs on heater 3 hours ago. Painful.

## 2019-01-22 NOTE — ED Provider Notes (Signed)
[facility] Emergency Department Provider Note ____________________________________________  Time seen: 1535  I have reviewed the triage vital signs and the nursing notes.  HISTORY  Chief Complaint  Chest Pain  HPI Rhonda Palmer is a 48 y.o. female resents to the ED for evaluation of acute chest wall pain.  Patient describes she rolled out of the bed while asleep, and apparently landed on a bedside heater.  She describes pain to the left ribs since the accident.   She denies any shortness of breath, cough, hemoptysis, or congestion.  She also denies any head injury, or loss of consciousness.  Past Medical History:  Diagnosis Date  . Anxiety   . Asthma   . Bipolar 1 disorder ([facility])   . Edema leg for twenty years per patient   taking lasix for edema  . Fibromyalgia   . Ovarian cyst   . Schizo affective schizophrenia ([facility])   . Thyroid disease     Patient Active Problem List   Diagnosis Date Noted  . Fall 03/27/2017  . Long term (current) use of opiate analgesic 06/26/2016  . Long term prescription opiate use 06/26/2016  . Opiate use 06/26/2016  . Obesity 01/17/2016  . History of opioid abuse ([facility]) 01/17/2016  . Asthma 01/17/2016  . Mixed hyperlipidemia 10/09/2015  . Hypokalemia 10/09/2015  . Headache above the eye region 06/16/2013  . Edema extremities 05/18/2013  . 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 GERD (gastroesophageal reflux disease) 04/11/2013  . Personality disorder ([facility]) 10/16/2012  . Chronic pain syndrome 06/18/2012  . S/P ankle fusion 05/21/2012  . Schizoaffective disorder, bipolar type ([facility]) 02/20/2012  . 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 Hypothyroidism 11/16/2002    Past Surgical History:  Procedure Laterality Date  . ABDOMINAL HYSTERECTOMY    . ANKLE SURGERY     pt. states long time ago    Prior to Admission  medications   Medication Sig Start Date End Date Taking? Authorizing Provider  albuterol (PROVENTIL HFA;VENTOLIN HFA) 108 (90 Base) MCG/ACT inhaler Inhale 2 puffs into the lungs every 6 (six) hours as needed for wheezing or shortness of breath.    [provider]  ARIPiprazole ER (ABILIFY MAINTENA) 300 MG SRER injection Inject 300 mg into the muscle every 28 (twenty-eight) days.    [provider]  clonazePAM (KLONOPIN) 0.5 MG tablet Take 0.5 mg by mouth 3 (three) times daily.  02/18/18   [provider]  cyclobenzaprine (FLEXERIL) 5 MG tablet Take 1 tablet (5 mg total) by mouth 3 (three) times daily as needed. 01/22/19   Lynessa Almanzar, Charlesetta IvoryJenise V Bacon, PA-C  EPINEPHrine (EPIPEN 2-PAK) 0.3 mg/0.3 mL IJ SOAJ injection Inject 0.3 mLs (0.3 mg total) into the muscle as needed for anaphylaxis. 02/23/18   Schaevitz, Myra Rudeavid Matthew, MD  furosemide (LASIX) 20 MG tablet Take 2 tablets (40 mg total) by mouth daily. 05/13/16   Nita SickleVeronese, [place], MD  gabapentin (NEURONTIN) 300 MG capsule Take 300 mg by mouth 3 (three) times daily. 12/16/17   [provider]  JARDIANCE 25 MG TABS tablet Take 25 mg by mouth daily. 01/13/18   [provider]  ketorolac (TORADOL) 10 MG tablet Take 1 tablet (10 mg total) by mouth every 8 (eight) hours. 01/22/19   Garren Greenman, Charlesetta IvoryJenise V Bacon, PA-C  Lactulose 20 GM/30ML SOLN Take 15 mLs (10 g total) by mouth daily. Until bowel function normal 11/26/18  Lavonia Drafts, MD  levothyroxine (SYNTHROID) 150 MCG tablet Take 300 mcg by mouth daily. 10/05/18   [provider]  lidocaine (LIDODERM) 5 % Place 1 patch onto the skin daily for 5 days. Remove & Discard patch after 12 hours of wear each day. 01/22/19 01/27/19  Taleen Prosser, Dannielle Karvonen, PA-C  LINZESS 72 MCG capsule Take 72 mg by mouth daily. 01/21/18   [provider]  QUEtiapine (SEROQUEL) 100 MG tablet Take 100 mg by mouth at bedtime. 10/05/18   [provider]  simvastatin  (ZOCOR) 20 MG tablet Take 20 mg by mouth at bedtime. 10/05/18   [provider]  traMADol (ULTRAM) 50 MG tablet Take 50 mg by mouth 4 (four) times daily as needed. 11/19/18   [provider]  venlafaxine (EFFEXOR) 100 MG tablet Take 100 mg by mouth 2 (two) times daily.    [provider]  zolpidem (AMBIEN) 10 MG tablet Take 10 mg by mouth at bedtime. 10/11/18   [provider]    Allergies Codeine, Sulfa antibiotics, Penicillins, and Aspirin  Family History  Problem Relation Age of Onset  . Heart failure Mother   . Bladder Cancer Neg Hx   . Kidney cancer Neg Hx     Social History Social History   Tobacco Use  . Smoking status: Never Smoker  . Smokeless tobacco: Never Used  Substance Use Topics  . Alcohol use: No  . Drug use: No    Review of Systems  Constitutional: Negative for fever. Eyes: Negative for visual changes. ENT: Negative for sore throat. Cardiovascular: Negative for chest pain. Respiratory: Negative for shortness of breath.  Chest wall pain as above. Gastrointestinal: Negative for abdominal pain, vomiting and diarrhea. Genitourinary: Negative for dysuria. Musculoskeletal: Negative for back pain. Skin: Negative for rash. Neurological: Negative for headaches, focal weakness or numbness. ____________________________________________  PHYSICAL EXAM:  VITAL SIGNS: ED Triage Vitals  Enc Vitals Group     BP 01/22/19 1449 122/79     Pulse Rate 01/22/19 1449 (!) 102     Resp 01/22/19 1449 19     Temp 01/22/19 1449 99.2 F (37.3 C)     Temp Source 01/22/19 1449 Oral     SpO2 01/22/19 1449 100 %     Weight 01/22/19 1449 300 lb (136.1 kg)     Height 01/22/19 1449 5' (1.524 m)     Head Circumference --      Peak Flow --      Pain Score 01/22/19 1506 10     Pain Loc --      Pain Edu? --      Excl. in [place]? --     Constitutional: Alert and oriented. Well appearing and in no distress. Head: Normocephalic and atraumatic. Eyes:  Conjunctivae are normal. Normal extraocular movements Neck: Supple. No thyromegaly. Cardiovascular: Normal rate, regular rhythm. Normal distal pulses. Respiratory: Normal respiratory effort. No wheezes/rales/rhonchi. No chest wall deformity, ecchymosis, or flail chest. Gastrointestinal: Soft and nontender. No distention. Musculoskeletal: Nontender with normal range of motion in all extremities.  Neurologic:  Normal gait without ataxia. Normal speech and language. No gross focal neurologic deficits are appreciated. Skin:  Skin is warm, dry and intact.  Yeast candida noted to intertriginous breast skin.  ____________________________________________   RADIOLOGY  DG Left Rib w/CXR  IMPRESSION: 1. No LEFT rib fractures identified. 2. No acute cardiopulmonary disease.  I, Melvenia Needles, personally viewed and evaluated these images (plain radiographs) as part of my medical decision  making, as well as reviewing the written report by the radiologist. ____________________________________________  PROCEDURES  Toradol 30 mg IM Norflex 60 mg PO Procedures ____________________________________________  INITIAL IMPRESSION / ASSESSMENT AND PLAN / ED COURSE  Presents to the ED for evaluation of injuries following a mechanical fall.  Patient rolled out of bed and landed on a bedside heater, causing chest wall contusion.  She is reassured by her negative x-rays at this time.  No acute rib fracture no cardiopulmonary process is noted.  Patient is treated with anti-inflammatories and muscle relaxants in the ED.  She will be discharged with prescription for the same.  She is also given Lidoderm patches to apply as directed patient follow-up with primary provider return to the ED as needed.  Rhonda Palmer was evaluated in Emergency Department on 01/22/2019 for the symptoms described in the history of present illness. She was evaluated in the context of the global COVID-19 pandemic, which  necessitated consideration that the patient might be at risk for infection with the SARS-CoV-2 virus that causes COVID-19. Institutional protocols and algorithms that pertain to the evaluation of patients at risk for COVID-19 are in a state of rapid change based on information released by regulatory bodies including the CDC and federal and state organizations. These policies and algorithms were followed during the patient's care in the ED. ____________________________________________  FINAL CLINICAL IMPRESSION(S) / ED DIAGNOSES  Final diagnoses:  Chest wall pain      Karmen Stabs, Charlesetta Ivory, PA-C 01/22/19 1659    Concha Se, MD 01/22/19 [phone]

## 2019-01-22 NOTE — ED Notes (Signed)
Patient transported to X-ray 

## 2019-01-22 NOTE — ED Notes (Signed)
St falling this morning rolled out of bed and hit the heater and floor. Denies hitting/ LOC. St feeling nauseous a this time. Left rib cage pain with activity, taking a deep breath. St taking OTC ibuprofen and tramadol w/o relief. St tramadol is Rx at pain clinic for "swollen legs".

## 2019-01-22 NOTE — ED Notes (Addendum)
Pt in no apparent distress, states that it hurts to take deep breaths. Blanket given

## 2019-01-22 NOTE — Discharge Instructions (Signed)
Your exam and XR are normal despite your fall. There is no evidence of fracture or dislocation to the ribs. You will be sore and stiff for a few days. Take the prescription meds as directed. Apply ice and/or moist heat to reduce pain. Follow-up with your provider as needed.

## 2019-01-23 ENCOUNTER — Emergency Department: Payer: Medicaid Other

## 2019-01-23 ENCOUNTER — Encounter: Payer: Self-pay | Admitting: Emergency Medicine

## 2019-01-23 ENCOUNTER — Emergency Department
Admission: EM | Admit: 2019-01-23 | Discharge: 2019-01-23 | Disposition: A | Discharge status: Home / Self Care | Payer: Medicaid Other | Attending: Emergency Medicine | Admitting: Emergency Medicine

## 2019-01-23 DIAGNOSIS — J45909 Unspecified asthma, uncomplicated: Secondary | ICD-10-CM | POA: Insufficient documentation

## 2019-01-23 DIAGNOSIS — R0789 Other chest pain: Secondary | ICD-10-CM | POA: Diagnosis present

## 2019-01-23 DIAGNOSIS — Z79899 Other long term (current) drug therapy: Secondary | ICD-10-CM | POA: Diagnosis not present

## 2019-01-23 DIAGNOSIS — R071 Chest pain on breathing: Secondary | ICD-10-CM | POA: Diagnosis not present

## 2019-01-23 DIAGNOSIS — R0781 Pleurodynia: Secondary | ICD-10-CM | POA: Insufficient documentation

## 2019-01-23 DIAGNOSIS — E039 Hypothyroidism, unspecified: Secondary | ICD-10-CM | POA: Insufficient documentation

## 2019-01-23 DIAGNOSIS — Z7984 Long term (current) use of oral hypoglycemic drugs: Secondary | ICD-10-CM | POA: Diagnosis not present

## 2019-01-23 LAB — URINALYSIS, COMPLETE (UACMP) WITH MICROSCOPIC
Bilirubin Urine: NEGATIVE
Glucose, UA: NEGATIVE mg/dL
Hgb urine dipstick: NEGATIVE
Ketones, ur: NEGATIVE mg/dL
Nitrite: NEGATIVE
Protein, ur: NEGATIVE mg/dL
Specific Gravity, Urine: 1.023 (ref 1.005–1.030)
Squamous Epithelial / HPF: >50 — ABNORMAL HIGH (ref 0–5)
pH: 6 (ref 5.0–8.0)

## 2019-01-23 MED ORDER — LIDOCAINE 5 % EX PTCH
1.0000 | MEDICATED_PATCH | CUTANEOUS | Status: DC
  Administered 2019-01-23: 1 via TRANSDERMAL
  Filled 2019-01-23: qty 1

## 2019-01-23 MED ORDER — DICLOFENAC SODIUM 1 % EX CREA: 1.0000 [IU] | TOPICAL_CREAM | Freq: Two times a day (BID) | CUTANEOUS | 0 refills | Status: DC | PRN

## 2019-01-23 MED ORDER — MELOXICAM 15 MG PO TABS: 15.0000 mg | ORAL_TABLET | Freq: Every day | ORAL | 0 refills | Status: DC

## 2019-01-23 MED ORDER — KETOROLAC TROMETHAMINE 30 MG/ML IJ SOLN
15.0000 mg | Freq: Once | INTRAMUSCULAR | Status: AC
  Administered 2019-01-23: 15 mg via INTRAVENOUS
  Filled 2019-01-23: qty 1

## 2019-01-23 MED ORDER — OXYCODONE-ACETAMINOPHEN 5-325 MG PO TABS
1.0000 | ORAL_TABLET | Freq: Once | ORAL | Status: AC
  Administered 2019-01-23: 1 via ORAL
  Filled 2019-01-23: qty 1

## 2019-01-23 MED ORDER — DICLOFENAC POTASSIUM 50 MG PO TABS: 50.0000 mg | ORAL_TABLET | Freq: Three times a day (TID) | ORAL | 0 refills | Status: DC

## 2019-01-23 MED ORDER — BACLOFEN 5 MG PO TABS: 5.0000 mg | ORAL_TABLET | Freq: Three times a day (TID) | ORAL | 0 refills | Status: DC | PRN

## 2019-01-23 NOTE — ED Provider Notes (Addendum)
[facility] Emergency Department Provider Note  ____________________________________________  Time seen: Approximately 8:40 AM  I have reviewed the triage vital signs and the nursing notes.   HISTORY  Chief Complaint Chest Pain    HPI Rhonda Palmer is a 48 y.o. female that presents to the emergency department for evaluation of left sided rib pain after a fall yesterday morning.  Patient states that she rolled out of bed and hit her left ribs on a heater, which broke her fall to the floor.  She was evaluated in this emergency department yesterday following fall.  Patient states that she was told that she does not have a broken rib. She feels like one of her ribs is broken, however.  She states that she can feel it moving.  Pain is worse with deep breathing and lifting her left arm. She states that pain feels like it is her rib cage and denies any abdominal pain.  She states "my belly feels fine."  She had difficulty sleeping last night due to pain.  She denies hitting her head or losing consciousness.  She has tramadol at home that she has been taking.  She also has ibuprofen, Toradol, and flexeril. These are not working. No SOB, vomiting, abdominal pain.  No urinary symptoms.   Past Medical History:  Diagnosis Date  . Anxiety   . Asthma   . Bipolar 1 disorder (HCC)   . Edema leg for twenty years per patient   taking lasix for edema  . Fibromyalgia   . Ovarian cyst   . Schizo affective schizophrenia (HCC)   . Thyroid disease     Patient Active Problem List   Diagnosis Date Noted  . Fall 03/27/2017  . Long term (current) use of opiate analgesic 06/26/2016  . Long term prescription opiate use 06/26/2016  . Opiate use 06/26/2016  . Obesity 01/17/2016  . History of opioid abuse (HCC) 01/17/2016  . Asthma 01/17/2016  . Mixed hyperlipidemia 10/09/2015  . Hypokalemia 10/09/2015  . Headache above the eye region 06/16/2013  . Edema extremities 05/18/2013   . 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 GERD (gastroesophageal reflux disease) 04/11/2013  . Personality disorder (HCC) 10/16/2012  . Chronic pain syndrome 06/18/2012  . S/P ankle fusion 05/21/2012  . Schizoaffective disorder, bipolar type (HCC) 02/20/2012  . 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 Hypothyroidism 11/16/2002    Past Surgical History:  Procedure Laterality Date  . ABDOMINAL HYSTERECTOMY    . ANKLE SURGERY     pt. states long time ago    Prior to Admission medications   Medication Sig Start Date End Date Taking? Authorizing Provider  albuterol (PROVENTIL HFA;VENTOLIN HFA) 108 (90 Base) MCG/ACT inhaler Inhale 2 puffs into the lungs every 6 (six) hours as needed for wheezing or shortness of breath.    [provider]  ARIPiprazole ER (ABILIFY MAINTENA) 300 MG SRER injection Inject 300 mg into the muscle every 28 (twenty-eight) days.    [provider]  Baclofen 5 MG TABS Take 5 mg by mouth 3 (three) times daily as needed. 01/23/19   Enid Derry, PA-C  clonazePAM (KLONOPIN) 0.5 MG tablet Take 0.5 mg by mouth 3 (three) times daily.  02/18/18   [provider]  cyclobenzaprine (FLEXERIL) 5 MG tablet Take 1 tablet (5 mg total) by mouth 3 (three) times daily as needed. 01/22/19   Menshew, Charlesetta Ivory,  PA-C  diclofenac (CATAFLAM) 50 MG tablet Take 1 tablet (50 mg total) by mouth 3 (three) times daily. 01/23/19   Enid Derry, PA-C  Diclofenac Sodium 1 % CREA Apply 1 Units topically 2 (two) times daily as needed. 01/23/19   Enid Derry, PA-C  EPINEPHrine (EPIPEN 2-PAK) 0.3 mg/0.3 mL IJ SOAJ injection Inject 0.3 mLs (0.3 mg total) into the muscle as needed for anaphylaxis. 02/23/18   Schaevitz, Myra Rude, MD  furosemide (LASIX) 20 MG tablet Take 2 tablets (40 mg total) by mouth daily. 05/13/16   Nita Sickle, MD  gabapentin  (NEURONTIN) 300 MG capsule Take 300 mg by mouth 3 (three) times daily. 12/16/17   [provider]  JARDIANCE 25 MG TABS tablet Take 25 mg by mouth daily. 01/13/18   [provider]  ketorolac (TORADOL) 10 MG tablet Take 1 tablet (10 mg total) by mouth every 8 (eight) hours. 01/22/19   Menshew, Charlesetta Ivory, PA-C  Lactulose 20 GM/30ML SOLN Take 15 mLs (10 g total) by mouth daily. Until bowel function normal 11/26/18   Jene Every, MD  levothyroxine (SYNTHROID) 150 MCG tablet Take 300 mcg by mouth daily. 10/05/18   [provider]  lidocaine (LIDODERM) 5 % Place 1 patch onto the skin daily for 5 days. Remove & Discard patch after 12 hours of wear each day. 01/22/19 01/27/19  Menshew, Charlesetta Ivory, PA-C  LINZESS 72 MCG capsule Take 72 mg by mouth daily. 01/21/18   [provider]  QUEtiapine (SEROQUEL) 100 MG tablet Take 100 mg by mouth at bedtime. 10/05/18   [provider]  simvastatin (ZOCOR) 20 MG tablet Take 20 mg by mouth at bedtime. 10/05/18   [provider]  traMADol (ULTRAM) 50 MG tablet Take 50 mg by mouth 4 (four) times daily as needed. 11/19/18   [provider]  venlafaxine (EFFEXOR) 100 MG tablet Take 100 mg by mouth 2 (two) times daily.    [provider]  zolpidem (AMBIEN) 10 MG tablet Take 10 mg by mouth at bedtime. 10/11/18   [provider]    Allergies Codeine, Sulfa antibiotics, Penicillins, and Aspirin  Family History  Problem Relation Age of Onset  . Heart failure Mother   . Bladder Cancer Neg Hx   . Kidney cancer Neg Hx     Social History Social History   Tobacco Use  . Smoking status: Never Smoker  . Smokeless tobacco: Never Used  Substance Use Topics  . Alcohol use: No  . Drug use: No     Review of Systems  Respiratory: No SOB. Gastrointestinal: No abdominal pain.  No nausea, no vomiting.  Musculoskeletal: Positive for chest wall pain. Skin: Negative for rash, abrasions,  lacerations, ecchymosis. Neurological: Negative for headaches, numbness or tingling   ____________________________________________   PHYSICAL EXAM:  VITAL SIGNS: ED Triage Vitals  Enc Vitals Group     BP 01/23/19 0709 (!) 125/43     Pulse Rate 01/23/19 0709 92     Resp 01/23/19 0709 18     Temp 01/23/19 0709 98.8 F (37.1 C)     Temp Source 01/23/19 0709 Oral     SpO2 01/23/19 0709 100 %     Weight 01/23/19 0703 (!) 300 lb 0.7 oz (136.1 kg)     Height --      Head Circumference --      Peak Flow --      Pain Score 01/23/19 0716 9     Pain Loc --  Pain Edu? --      Excl. in GC? --      Constitutional: Alert and oriented. Well appearing and in no acute distress. Eyes: Conjunctivae are normal. PERRL. EOMI. Head: Atraumatic. ENT:      Ears:      Nose: No congestion/rhinnorhea.      Mouth/Throat: Mucous membranes are moist.  Neck: No stridor. No cervical spine tenderness to palpation. Cardiovascular: Normal rate, regular rhythm.  Good peripheral circulation. Respiratory: Normal respiratory effort without tachypnea or retractions. Lungs CTAB. Good air entry to the bases with no decreased or absent breath sounds. Musculoskeletal: Full range of motion to all extremities. No gross deformities appreciated.  Tenderness to palpation to left lateral and anterior rib cage.  Neurologic:  Normal speech and language. No gross focal neurologic deficits are appreciated.  Skin:  Skin is warm, dry and intact. No rash noted.  No ecchymosis to chest, back or abdomen. Psychiatric: Mood and affect are normal. Speech and behavior are normal. Patient exhibits appropriate insight and judgement.   ____________________________________________   LABS (all labs ordered are listed, but only abnormal results are displayed)  Labs Reviewed  URINALYSIS, COMPLETE (UACMP) WITH MICROSCOPIC - Abnormal; Notable for the following components:      Result Value   Color, Urine YELLOW (*)    APPearance  CLOUDY (*)    Leukocytes,Ua TRACE (*)    Bacteria, UA MANY (*)    Squamous Epithelial / LPF >50 (*)    All other components within normal limits  URINE CULTURE   ____________________________________________  EKG   ____________________________________________  RADIOLOGY Lexine BatonI, Tamiko Leopard, personally viewed and evaluated these images (plain radiographs) as part of my medical decision making, as well as reviewing the written report by the radiologist.  DG Ribs Unilateral W/Chest Left  Result Date: 01/22/2019 CLINICAL DATA:  48 year old who fell off of her couch, landing on the LEFT ANTERIOR chest, now with LEFT UPPER ANTERIOR and LATERAL chest pain. Surgical history includes thymectomy. EXAM: LEFT RIBS AND CHEST - 3+ VIEW COMPARISON:  No prior LEFT rib imaging. Chest x-ray 05/19/2018 and earlier. FINDINGS: No LEFT rib fractures identified. No intrinsic osseous abnormalities involving the LEFT ribs. Cardiomediastinal silhouette unremarkable and unchanged. Post surgical changes related to prior thymectomy. Lungs clear. Bronchovascular markings normal. Pulmonary vascularity normal. No visible pleural effusions. No pneumothorax. IMPRESSION: 1. No LEFT rib fractures identified. 2. No acute cardiopulmonary disease. Electronically Signed   By: Hulan Saashomas  Lawrence M.D.   On: 01/22/2019 15:42   DG Chest Portable 1 View  Result Date: 01/23/2019 CLINICAL DATA:  Worsening rib pain with injury yesterday after falling out of bed. EXAM: PORTABLE CHEST 1 VIEW COMPARISON:  01/22/2019 rib series FINDINGS: Lungs are hypoinflated without consolidation or effusion. Mild cardiomegaly. No definite rib fracture. Remainder of the exam is unchanged. IMPRESSION: No acute findings. Electronically Signed   By: Elberta Fortisaniel  Boyle M.D.   On: 01/23/2019 09:25    ____________________________________________    PROCEDURES  Procedure(s) performed:    Procedures    Medications  oxyCODONE-acetaminophen (PERCOCET/ROXICET)  5-325 MG per tablet 1 tablet (1 tablet Oral Given 01/23/19 0908)  ketorolac (TORADOL) 30 MG/ML injection 15 mg (15 mg Intravenous Given 01/23/19 1032)     ____________________________________________   INITIAL IMPRESSION / ASSESSMENT AND PLAN / ED COURSE  Pertinent labs & imaging results that were available during my care of the patient were reviewed by me and considered in my medical decision making (see chart for details).  Review of the [place]  CSRS was performed in accordance of the [place] prior to dispensing any controlled drugs.    Patient presented to emergency department for evaluation of left rib pain after fall yesterday.  Vital signs and exam are reassuring.   No left rib fracture or acute cardiopulmonary processes on x-ray yesterday per radiology. One-view portable chest x-ray today also reveal no acute cardiopulmonary processes. Patient denies any abdominal discomfort. Patient felt improved after oxycodone.  After review of the [place], patient has had multiple narcotic prescriptions this year and has an overdose risk score of 650. No narcotic prescriptions will be prescribed during this visit.  Patient would like to try something different than the Toradol and Flexeril she received yesterday. She has taken Meloxicam previously from the pain clinic for leg pain and has not found it helpful before. She was given a prescription for diclofenac and baclofen to try tonight instead.  Patient had leukocytes and many bacteria in her urine.  She also had many squamous cells and will be sent for culture.  She denies any urinary symptoms.  Patient is to follow up with PCP and pain clinic as directed. Patient is given ED precautions to return to the ED for any worsening or new symptoms.  Rhonda Palmer was evaluated in [facility] on 01/23/2019 for the symptoms described in the history of present illness. She was evaluated in the context of the global COVID-19 pandemic, which necessitated  consideration that the patient might be at risk for infection with the SARS-CoV-2 virus that causes COVID-19. Institutional protocols and algorithms that pertain to the evaluation of patients at risk for COVID-19 are in a state of rapid change based on information released by regulatory bodies including the CDC and federal and state organizations. These policies and algorithms were followed during the patient's care in the ED.   ____________________________________________  FINAL CLINICAL IMPRESSION(S) / ED DIAGNOSES  Final diagnoses:  Rib pain on left side      NEW MEDICATIONS STARTED DURING THIS VISIT:  ED Discharge Orders         Ordered    meloxicam (MOBIC) 15 MG tablet  Daily,   Status:  Discontinued     01/23/19 1013    Baclofen 5 MG TABS  3 times daily PRN     01/23/19 1013    Diclofenac Sodium 1 % CREA  2 times daily PRN     01/23/19 1015    diclofenac (CATAFLAM) 50 MG tablet  3 times daily     01/23/19 1050              This chart was dictated using voice recognition software/Dragon. Despite best efforts to proofread, errors can occur which can change the meaning. Any change was purely unintentional.    Laban Emperor, PA-C 01/23/19 1554    Laban Emperor, PA-C 01/23/19 1555    Duffy Bruce, MD 01/25/19 1038

## 2019-01-23 NOTE — ED Notes (Signed)
No reaction noted to injection site.  

## 2019-01-23 NOTE — ED Triage Notes (Signed)
C/O worsening rib pain.  States was seen through ED yesterday for same.  Believes she has cracked a rib since evaluation yesterday because pain is worse.  AAOx3.  Skin warm and dry.  No SOB/ DOE.  NAD

## 2019-01-23 NOTE — Discharge Instructions (Addendum)
Please call your primary care provider tomorrow for an appointment early this week.  You can try the diclofenac instead of the Toradol and try the baclofen instead of the Flexeril for pain.  Please use a heating pad.

## 2019-01-24 LAB — URINE CULTURE

## 2019-06-30 IMAGING — CT CT RENAL STONE PROTOCOL
3 of 5 series · 17 of 46 positions shown, 19 images · non-contrast
Comparison: CT AP 04/14/2017

CLINICAL DATA: Back pain since 3 p.m..  Hematuria.

EXAM:
CT ABDOMEN AND PELVIS WITHOUT CONTRAST
TECHNIQUE: Multidetector CT imaging of the abdomen and pelvis was performed
following the standard protocol without IV contrast.

[Series 2: stone full standard · axial · 0.74mm/px · z∈[-1110,-720]mm · 12 of 94 slices shown, 14 images]
[im 8/94  soft-tissue]
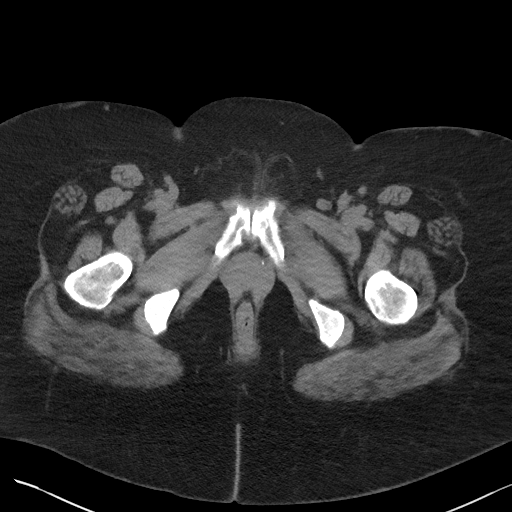
[im 8/94  bone]
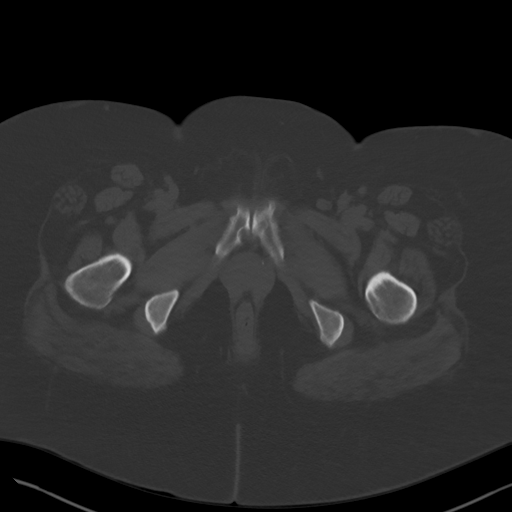
[im 15/94  soft-tissue]
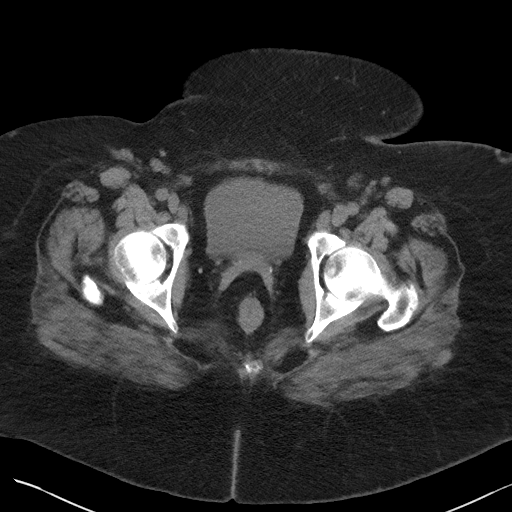
[im 22/94  soft-tissue]
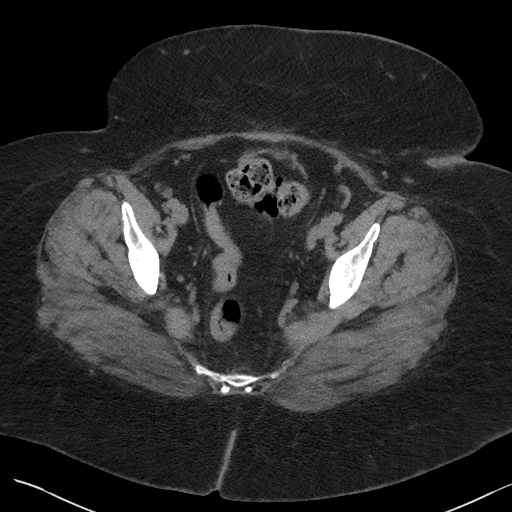
[im 29/94  soft-tissue]
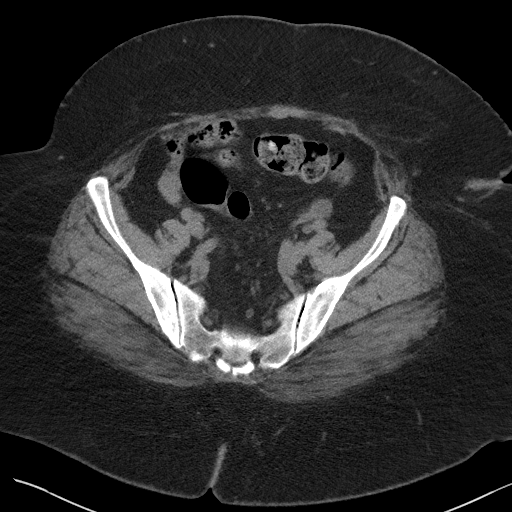
[im 36/94  soft-tissue]
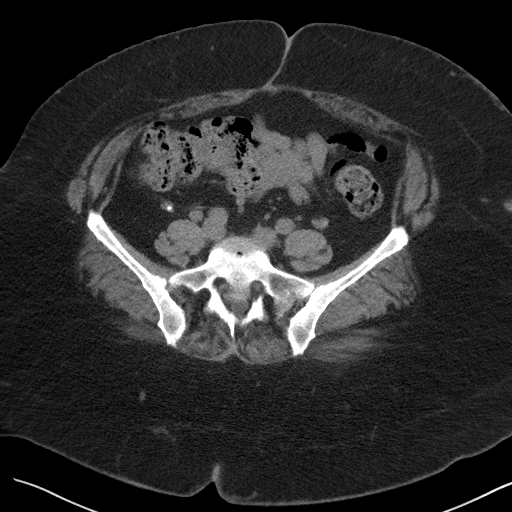
[im 43/94  soft-tissue]
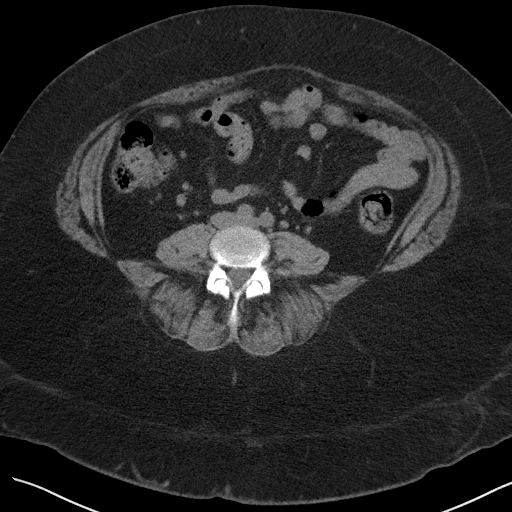
[im 51/94  soft-tissue]
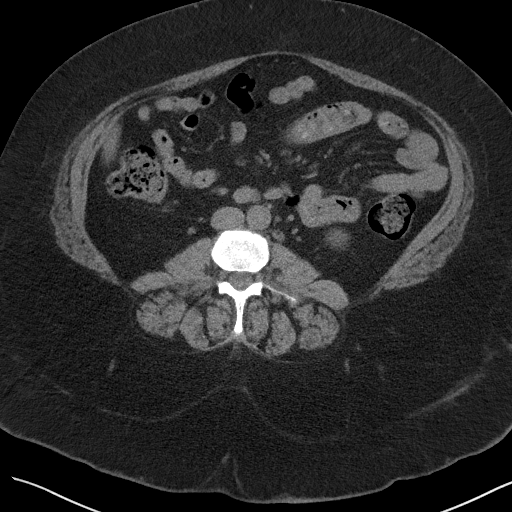
[im 58/94  soft-tissue]
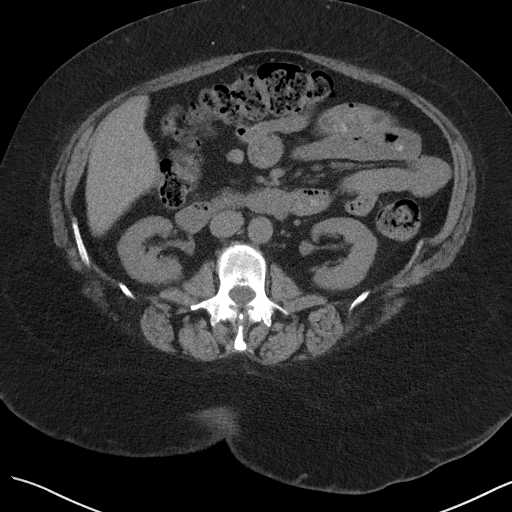
[im 65/94  soft-tissue]
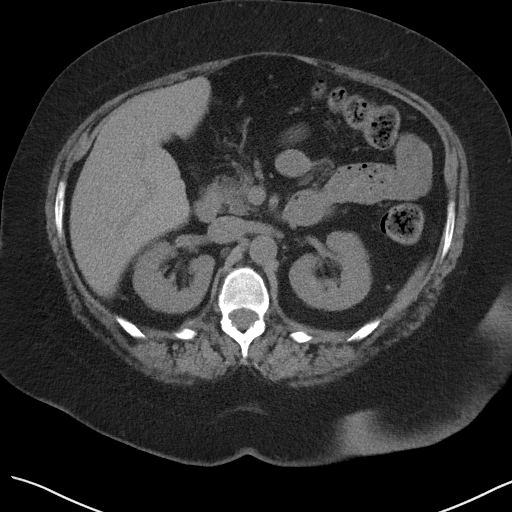
[im 65/94  bone]
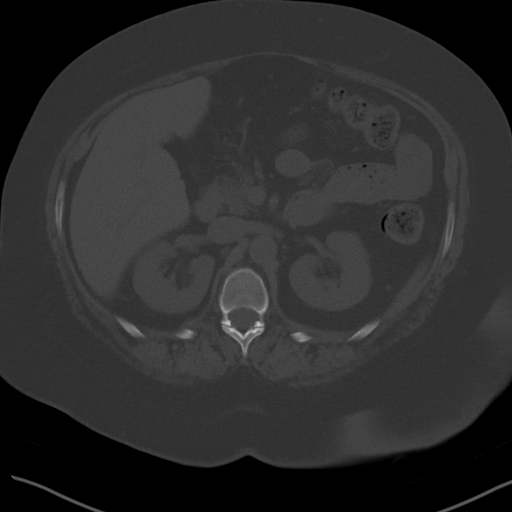
[im 72/94  soft-tissue]
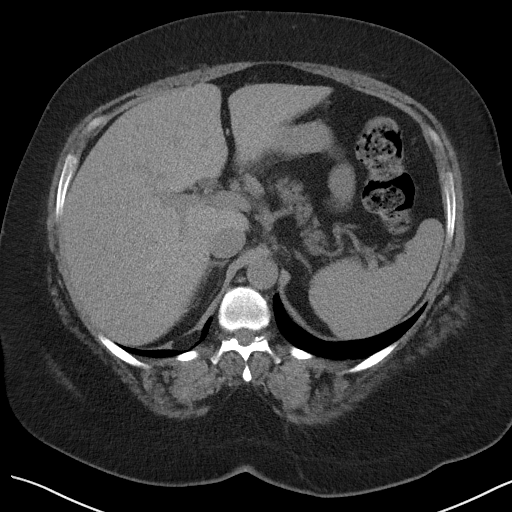
[im 79/94  soft-tissue]
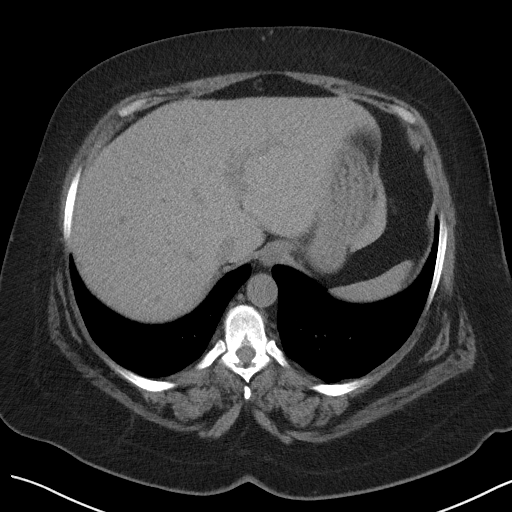
[im 86/94  soft-tissue]
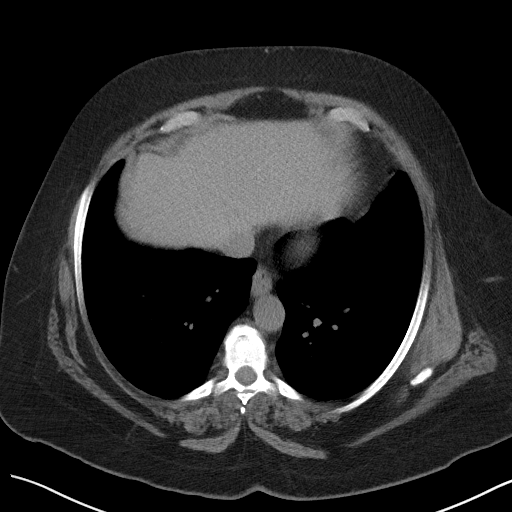

[Series 4: lung bases · axial · 0.74mm/px · z∈[-770,-726]mm · 2 of 28 slices shown]
[im 10/28  bone]
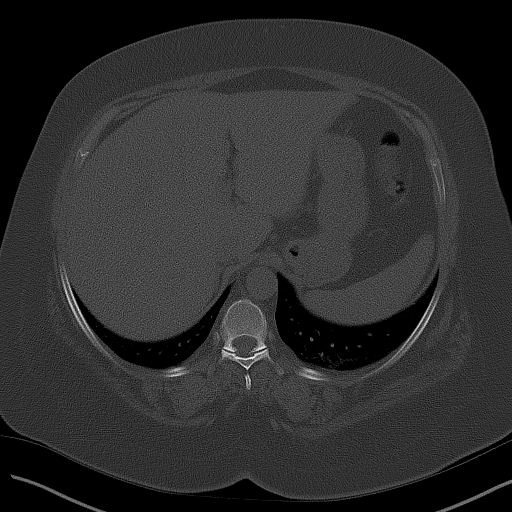
[im 19/28  bone]
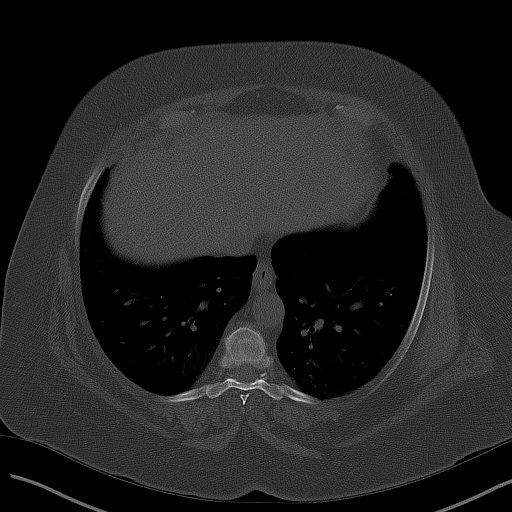

[Series 5: coronal · coronal · 0.91mm/px · 3 of 190 slices shown]
[im 64/190  soft-tissue]
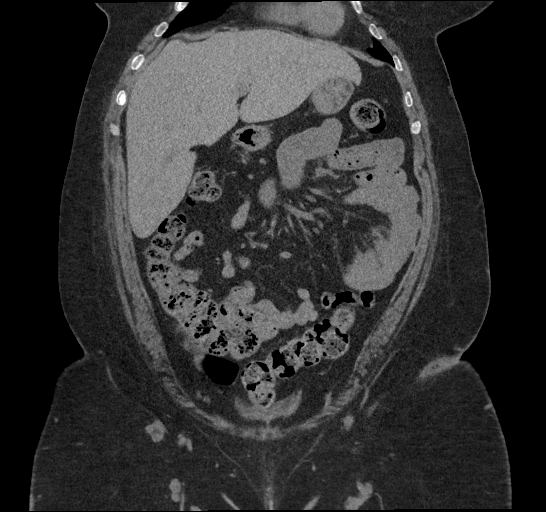
[im 85/190  soft-tissue]
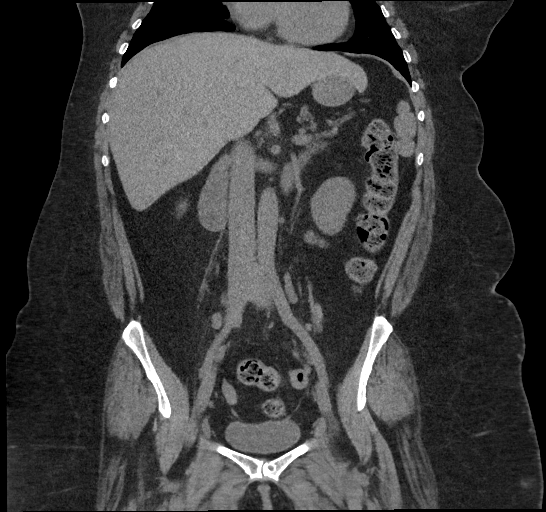
[im 106/190  soft-tissue]
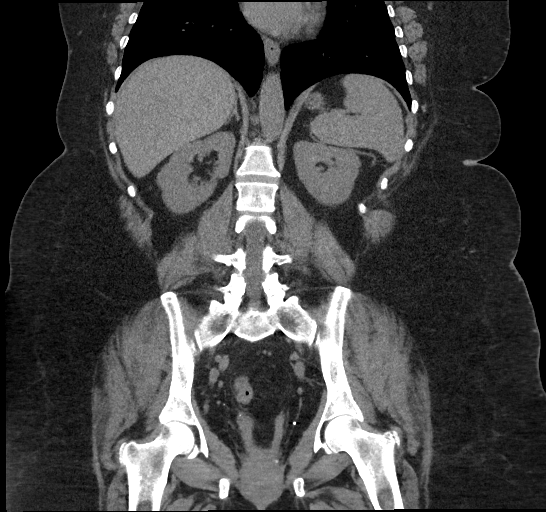

[17 of 46 positions shown; findings below may reference images not displayed]

FINDINGS: Lower chest: Top-normal heart size. Clear lung bases. Minimal
dependent bibasilar atelectasis.

Hepatobiliary: Cholecystectomy. The unenhanced liver is
unremarkable.

Pancreas: Normal

Spleen: Normal

Adrenals/Urinary Tract: Adrenal glands are unremarkable. Kidneys are
normal, without renal calculi, focal lesion, or hydronephrosis.
Bladder is unremarkable.

Stomach/Bowel: Stomach is within normal limits. Appendix appears
normal in caliber and slightly filled with enteric contrast. No
evidence of bowel wall thickening, distention, or inflammatory
changes.

Vascular/Lymphatic: No significant vascular findings are present. No
enlarged abdominal or pelvic lymph nodes.

Reproductive: Status post hysterectomy. Stable 1.5 cm left follicle.

Other: Tiny periumbilical fat containing hernia. No abdominopelvic
ascites.

Musculoskeletal: Degenerative disc disease L5-S1. No acute nor
suspicious osseous lesions.
IMPRESSION: No acute intra-abdominal nor pelvic abnormality. Tiny fat containing
periumbilical hernia. Physiologic sized follicle noted in the left
ovary.

## 2019-10-08 IMAGING — CR DG CHEST 2V
1 series · 2 of 2 positions shown · non-contrast
Comparison: 02/12/2018

CLINICAL DATA: Left side chest pain

EXAM:
CHEST - 2 VIEW

[Series 1: dg chest 2 view · 0.14mm/px · 2 of 2 slices shown]
[im 1/2]
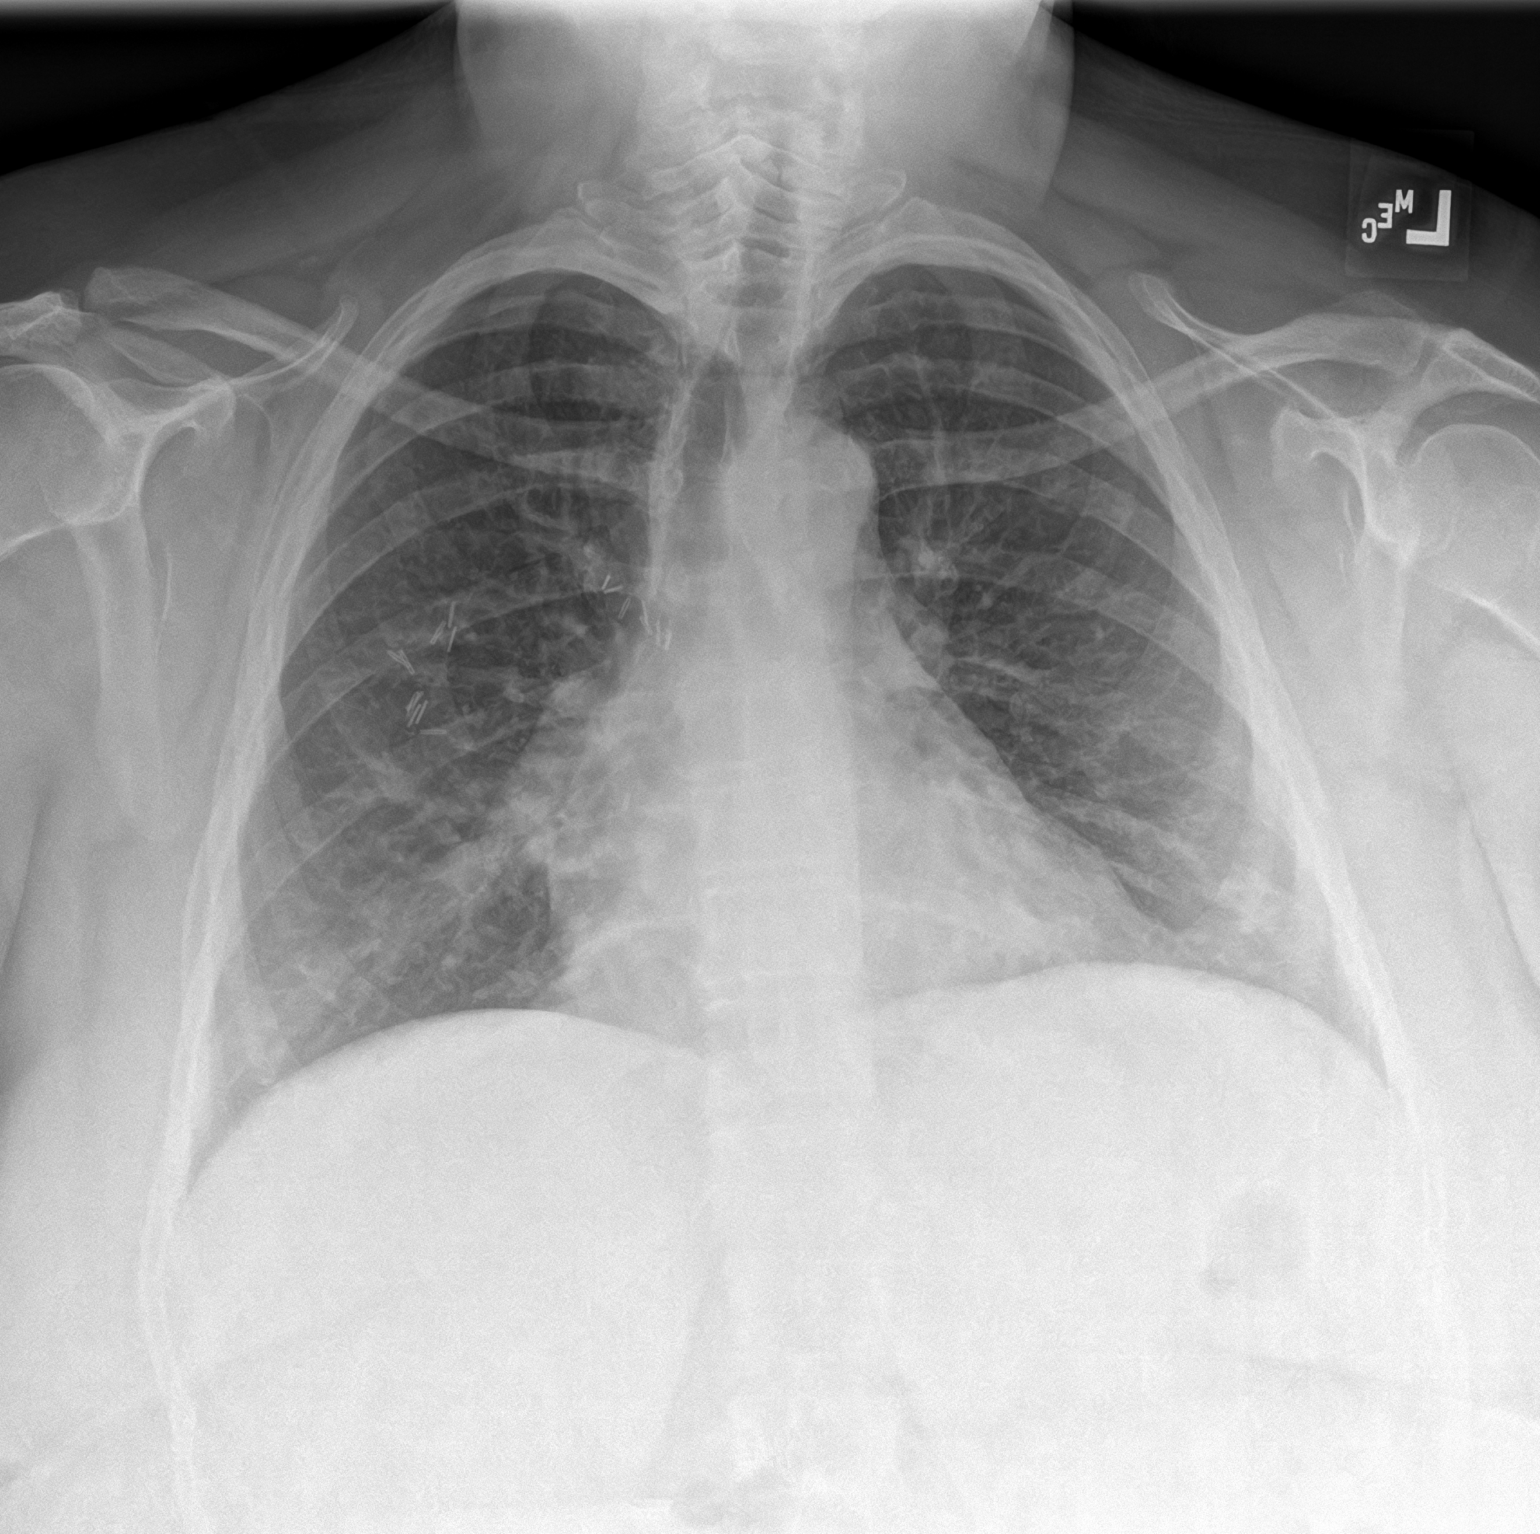
[im 2/2]
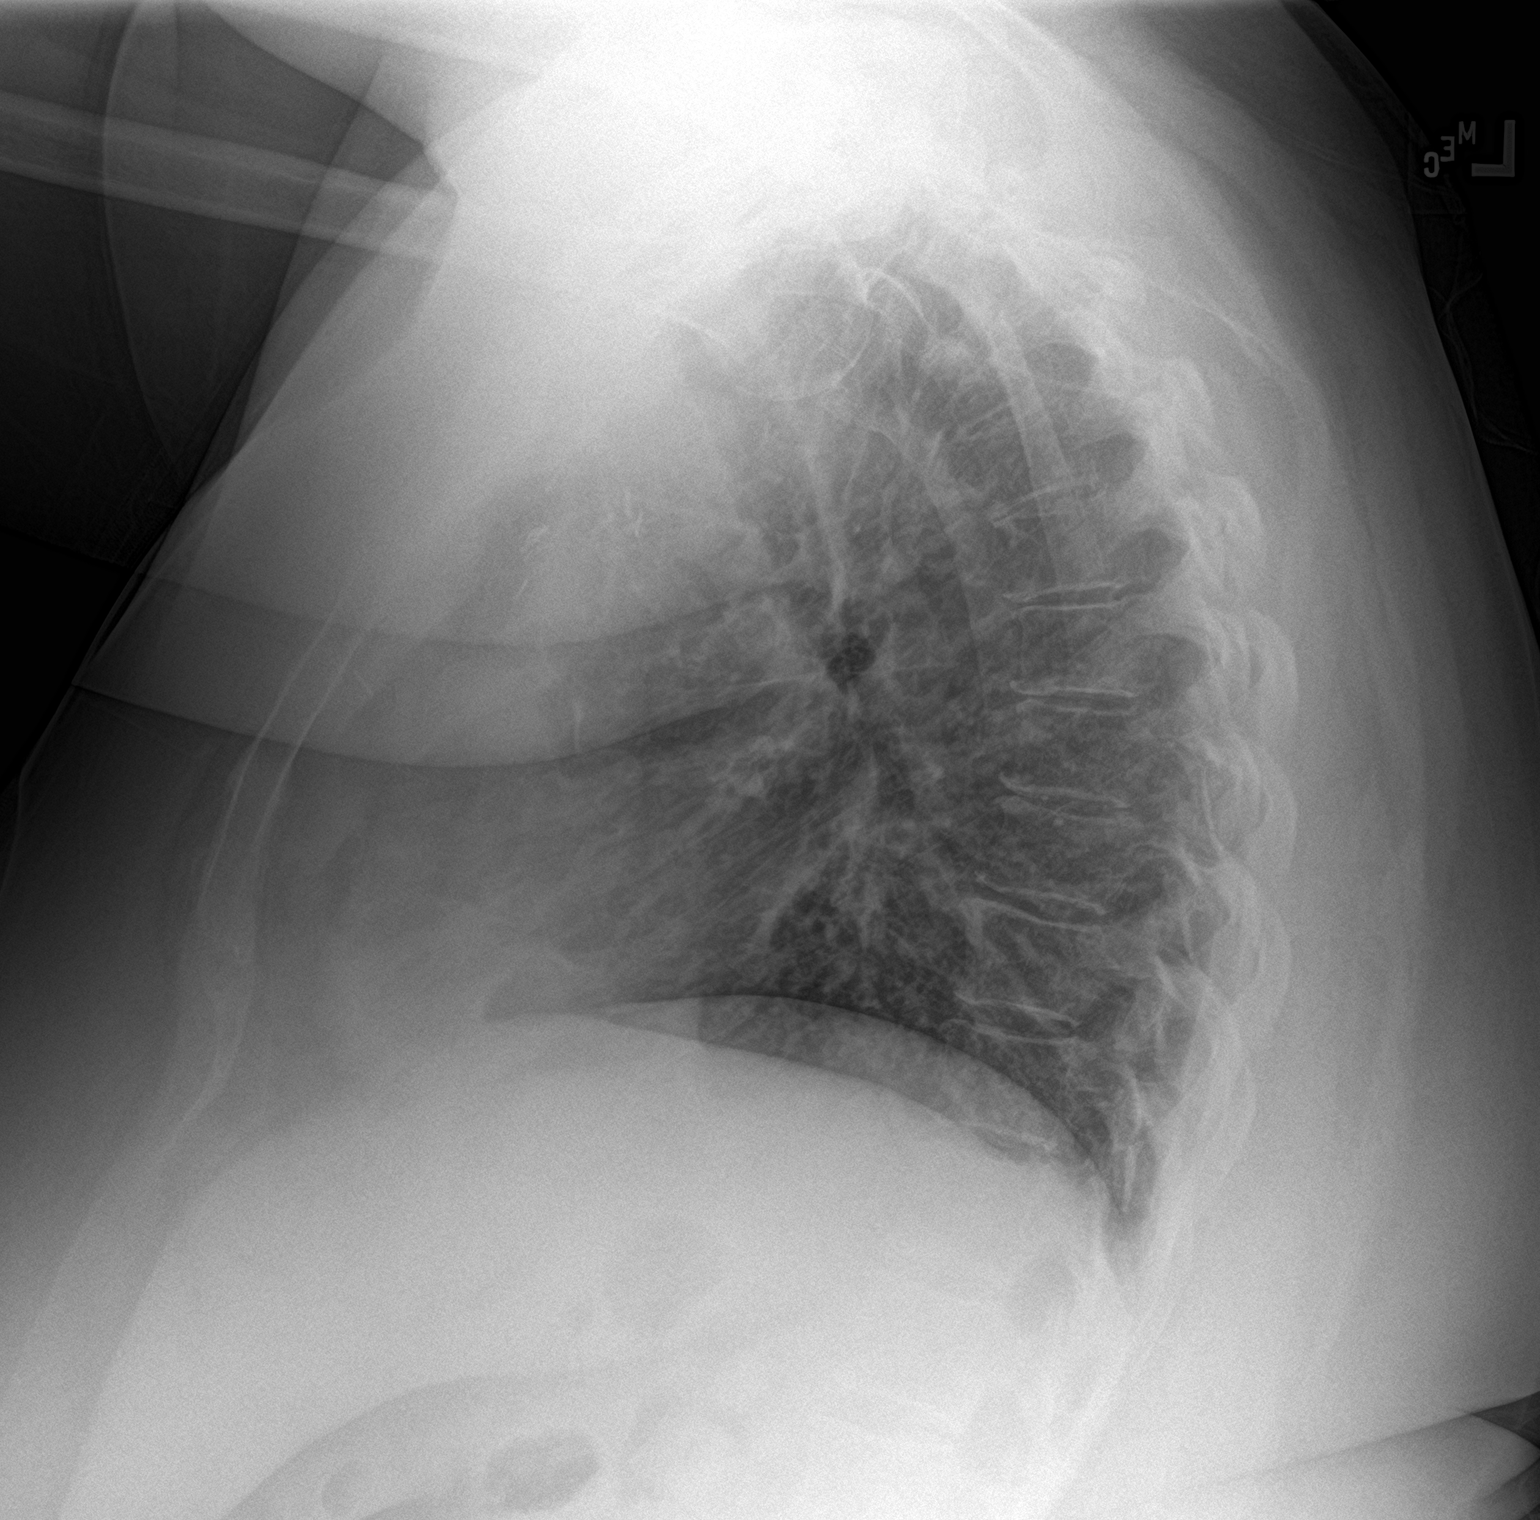

[2 of 2 positions shown; findings below may reference images not displayed]

FINDINGS: Postoperative changes on the right. Heart is upper limits normal in
size. No confluent airspace opacities or effusions. No acute bony
abnormality.
IMPRESSION: No active cardiopulmonary disease.

## 2019-11-20 ENCOUNTER — Emergency Department: Payer: Medicaid Other

## 2019-11-20 ENCOUNTER — Emergency Department
Admission: EM | Admit: 2019-11-20 | Discharge: 2019-11-20 | Disposition: A | Discharge status: Home / Self Care | Payer: Medicaid Other | Attending: Emergency Medicine | Admitting: Emergency Medicine

## 2019-11-20 ENCOUNTER — Other Ambulatory Visit: Payer: Self-pay

## 2019-11-20 DIAGNOSIS — E039 Hypothyroidism, unspecified: Secondary | ICD-10-CM | POA: Diagnosis not present

## 2019-11-20 DIAGNOSIS — M79662 Pain in left lower leg: Secondary | ICD-10-CM | POA: Diagnosis not present

## 2019-11-20 DIAGNOSIS — Z79899 Other long term (current) drug therapy: Secondary | ICD-10-CM | POA: Insufficient documentation

## 2019-11-20 DIAGNOSIS — J45909 Unspecified asthma, uncomplicated: Secondary | ICD-10-CM | POA: Diagnosis not present

## 2019-11-20 LAB — COMPREHENSIVE METABOLIC PANEL
ALT: 18 U/L (ref 0–44)
AST: 18 U/L (ref 15–41)
Albumin: 4 g/dL (ref 3.5–5.0)
Alkaline Phosphatase: 91 U/L (ref 38–126)
Anion gap: 8 (ref 5–15)
BUN: 14 mg/dL (ref 6–20)
CO2: 33 mmol/L — ABNORMAL HIGH (ref 22–32)
Calcium: 9.5 mg/dL (ref 8.9–10.3)
Chloride: 102 mmol/L (ref 98–111)
Creatinine, Ser: 0.53 mg/dL (ref 0.44–1.00)
GFR, Estimated: >60 mL/min (ref >60)
Glucose, Bld: 116 mg/dL — ABNORMAL HIGH (ref 70–99)
Potassium: 3.9 mmol/L (ref 3.5–5.1)
Sodium: 143 mmol/L (ref 135–145)
Total Bilirubin: 0.7 mg/dL (ref 0.3–1.2)
Total Protein: 7.9 g/dL (ref 6.5–8.1)

## 2019-11-20 LAB — CBC
HCT: 39.4 % (ref 36.0–46.0)
Hemoglobin: 12 g/dL (ref 12.0–15.0)
MCH: 27.4 pg (ref 26.0–34.0)
MCHC: 30.5 g/dL (ref 30.0–36.0)
MCV: 90 fL (ref 80.0–100.0)
Platelets: 332 10*3/uL (ref 150–400)
RBC: 4.38 MIL/uL (ref 3.87–5.11)
RDW: 16.6 % — ABNORMAL HIGH (ref 11.5–15.5)
WBC: 8.3 10*3/uL (ref 4.0–10.5)
nRBC: 0 % (ref 0.0–0.2)

## 2019-11-20 LAB — FIBRIN DERIVATIVES D-DIMER (ARMC ONLY): Fibrin derivatives D-dimer (ARMC): 364.45 ng/mL (FEU) (ref 0.00–499.00)

## 2019-11-20 NOTE — ED Notes (Signed)
Patient reports 4-5 days of left calf pain that has progressively gotten worse. Denies any extended car trips or air travel. Leg is warm, dry and normal color for ethnicity.

## 2019-11-20 NOTE — ED Notes (Signed)
[place] technician in patient room for procedure

## 2019-11-20 NOTE — ED Notes (Signed)
[place] Procedure complete. Patient access in.

## 2019-11-20 NOTE — ED Triage Notes (Signed)
Pt states that she has had severe pain in her left calf going to her foot- pt states there is swelling and redness- pt called the nurse line and was told that she needed to come here and get check out for a possible blood clot

## 2019-11-20 NOTE — ED Provider Notes (Signed)
[facility] Emergency Department Provider Note   ____________________________________________    I have reviewed the triage vital signs and the nursing notes.   HISTORY  Chief Complaint Leg Pain     HPI Rhonda Palmer is a 49 y.o. female with a history as noted below presents with complaints of left calf pain.  Patient describes posterior pain in her lower leg on the left times several days.  She reports calling the nurses line and they sent her to the ED for evaluation for possible blood clot.  She denies a history of blood clots.  No pleurisy.  No injury to the area.  Some redness noted.  No fevers or chills.  Has not take anything for this  Past Medical History:  Diagnosis Date  . Anxiety   . Asthma   . Bipolar 1 disorder (HCC)   . Edema leg for twenty years per patient   taking lasix for edema  . Fibromyalgia   . Ovarian cyst   . Schizo affective schizophrenia (HCC)   . Thyroid disease     Patient Active Problem List   Diagnosis Date Noted  . Fall 03/27/2017  . Long term (current) use of opiate analgesic 06/26/2016  . Long term prescription opiate use 06/26/2016  . Opiate use 06/26/2016  . Obesity 01/17/2016  . History of opioid abuse (HCC) 01/17/2016  . Asthma 01/17/2016  . Mixed hyperlipidemia 10/09/2015  . Hypokalemia 10/09/2015  . Headache above the eye region 06/16/2013  . Edema extremities 05/18/2013  . 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 GERD (gastroesophageal reflux disease) 04/11/2013  . Personality disorder (HCC) 10/16/2012  . Chronic pain syndrome 06/18/2012  . S/P ankle fusion 05/21/2012  . Schizoaffective disorder, bipolar type (HCC) 02/20/2012  . 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 Hypothyroidism 11/16/2002    Past Surgical History:  Procedure Laterality Date  . ABDOMINAL HYSTERECTOMY    . ANKLE  SURGERY     pt. states long time ago    Prior to Admission medications   Medication Sig Start Date End Date Taking? Authorizing Provider  albuterol (PROVENTIL HFA;VENTOLIN HFA) 108 (90 Base) MCG/ACT inhaler Inhale 2 puffs into the lungs every 6 (six) hours as needed for wheezing or shortness of breath.    [provider]  ARIPiprazole ER (ABILIFY MAINTENA) 300 MG SRER injection Inject 300 mg into the muscle every 28 (twenty-eight) days.    [provider]  Baclofen 5 MG TABS Take 5 mg by mouth 3 (three) times daily as needed. 01/23/19   Enid Derry, PA-C  clonazePAM (KLONOPIN) 0.5 MG tablet Take 0.5 mg by mouth 3 (three) times daily.  02/18/18   [provider]  cyclobenzaprine (FLEXERIL) 5 MG tablet Take 1 tablet (5 mg total) by mouth 3 (three) times daily as needed. 01/22/19   Menshew, Charlesetta Ivory, PA-C  diclofenac (CATAFLAM) 50 MG tablet Take 1 tablet (50 mg total) by mouth 3 (three) times daily. 01/23/19   Enid Derry, PA-C  Diclofenac Sodium 1 % CREA Apply 1 Units topically 2 (two) times daily as needed. 01/23/19   Enid Derry, PA-C  EPINEPHrine (EPIPEN 2-PAK) 0.3 mg/0.3 mL IJ SOAJ injection Inject 0.3 mLs (0.3 mg total) into the muscle as needed for anaphylaxis. 02/23/18   Schaevitz, Myra Rude, MD  furosemide (LASIX) 20 MG tablet Take 2 tablets (40 mg total) by mouth daily. 05/13/16  Don Perking, [place], MD  gabapentin (NEURONTIN) 300 MG capsule Take 300 mg by mouth 3 (three) times daily. 12/16/17   [provider]  JARDIANCE 25 MG TABS tablet Take 25 mg by mouth daily. 01/13/18   [provider]  ketorolac (TORADOL) 10 MG tablet Take 1 tablet (10 mg total) by mouth every 8 (eight) hours. 01/22/19   Menshew, Charlesetta Ivory, PA-C  Lactulose 20 GM/30ML SOLN Take 15 mLs (10 g total) by mouth daily. Until bowel function normal 11/26/18   Jene Every, MD  levothyroxine (SYNTHROID) 150 MCG tablet Take 300 mcg by mouth daily. 10/05/18    [provider]  LINZESS 72 MCG capsule Take 72 mg by mouth daily. 01/21/18   [provider]  QUEtiapine (SEROQUEL) 100 MG tablet Take 100 mg by mouth at bedtime. 10/05/18   [provider]  simvastatin (ZOCOR) 20 MG tablet Take 20 mg by mouth at bedtime. 10/05/18   [provider]  traMADol (ULTRAM) 50 MG tablet Take 50 mg by mouth 4 (four) times daily as needed. 11/19/18   [provider]  venlafaxine (EFFEXOR) 100 MG tablet Take 100 mg by mouth 2 (two) times daily.    [provider]  zolpidem (AMBIEN) 10 MG tablet Take 10 mg by mouth at bedtime. 10/11/18   [provider]     Allergies Codeine, Sulfa antibiotics, Penicillins, and Aspirin  Family History  Problem Relation Age of Onset  . Heart failure Mother   . Bladder Cancer Neg Hx   . Kidney cancer Neg Hx     Social History Social History   Tobacco Use  . Smoking status: Never Smoker  . Smokeless tobacco: Never Used  Vaping Use  . Vaping Use: Never used  Substance Use Topics  . Alcohol use: No  . Drug use: No    Review of Systems  Constitutional: No fever/chills Eyes: No visual changes.  ENT: No sore throat. Cardiovascular: Denies chest pain. Respiratory: Denies shortness of breath. Gastrointestinal: No abdominal pain.   Genitourinary: Negative for dysuria. Musculoskeletal: As above Skin: Negative for rash. Neurological: Negative for headaches    ____________________________________________   PHYSICAL EXAM:  VITAL SIGNS: ED Triage Vitals  Enc Vitals Group     BP 11/20/19 1025 (!) 142/113     Pulse Rate 11/20/19 1025 93     Resp 11/20/19 1025 16     Temp 11/20/19 1025 98.8 F (37.1 C)     Temp Source 11/20/19 1025 Oral     SpO2 11/20/19 1025 98 %     Weight 11/20/19 1022 136.1 kg (300 lb)     Height 11/20/19 1022 1.549 m (5\' 1" )     Head Circumference --      Peak Flow --      Pain Score 11/20/19 1022 8     Pain Loc --      Pain Edu? --       Excl. in GC? --     Constitutional: Alert and oriented.   Nose: No congestion/rhinnorhea. Mouth/Throat: Mucous membranes are moist.    Cardiovascular: Normal rate, regular rhythm.  Good peripheral circulation. Respiratory: Normal respiratory effort.  No retractions. Gastrointestinal: Soft and nontender. No distention.  No CVA tenderness. Musculoskeletal: Left leg: Left calf, minimal tenderness, no erythema or significant edema, no evidence of infection, warm and well perfused. Neurologic:  Normal speech and language. No gross focal neurologic deficits are appreciated.  Skin:  Skin is warm, dry and intact. No rash  noted. Psychiatric: Mood and affect are normal. Speech and behavior are normal.  ____________________________________________   LABS (all labs ordered are listed, but only abnormal results are displayed)  Labs Reviewed  CBC - Abnormal; Notable for the following components:      Result Value   RDW 16.6 (*)    All other components within normal limits  COMPREHENSIVE METABOLIC PANEL - Abnormal; Notable for the following components:   CO2 33 (*)    Glucose, Bld 116 (*)    All other components within normal limits  FIBRIN DERIVATIVES D-DIMER ([facility] ONLY)   ____________________________________________  EKG  None ____________________________________________  RADIOLOGY  Ultrasound reviewed by me, no evidence of DVT, confirmed by radiology ____________________________________________   PROCEDURES  Procedure(s) performed: No  Procedures   Critical Care performed: No ____________________________________________   INITIAL IMPRESSION / ASSESSMENT AND PLAN / ED COURSE  Pertinent labs & imaging results that were available during my care of the patient were reviewed by me and considered in my medical decision making (see chart for details).  Patient presents for evaluation for possible DVT given calf pain.  Also on the differential is Baker's cyst and  musculoskeletal pain.  Lab work today is reassuring, normal D-dimer.  No evidence of infection on exam.  Minimal tenderness, suspicious for musculoskeletal pain, pending ultrasound  Ultrasound negative for DVT, patient is reassured, recommend supportive care, outpatient follow-up as needed if no improvement.    ____________________________________________   FINAL CLINICAL IMPRESSION(S) / ED DIAGNOSES  Final diagnoses:  Pain of left calf        Note:  This document was prepared using Dragon voice recognition software and may include unintentional dictation errors.   Jene Every, MD 11/20/19 1250

## 2020-01-23 ENCOUNTER — Encounter: Payer: Self-pay | Admitting: Cardiology

## 2020-02-22 ENCOUNTER — Emergency Department
Admission: EM | Admit: 2020-02-22 | Discharge: 2020-02-22 | Disposition: A | Discharge status: Home / Self Care | Payer: Medicaid Other | Attending: Student in an Organized Health Care Education/Training Program | Admitting: Student in an Organized Health Care Education/Training Program

## 2020-02-22 ENCOUNTER — Other Ambulatory Visit: Payer: Self-pay

## 2020-02-22 ENCOUNTER — Emergency Department: Payer: Medicaid Other

## 2020-02-22 DIAGNOSIS — J45909 Unspecified asthma, uncomplicated: Secondary | ICD-10-CM | POA: Diagnosis not present

## 2020-02-22 DIAGNOSIS — E039 Hypothyroidism, unspecified: Secondary | ICD-10-CM | POA: Diagnosis not present

## 2020-02-22 DIAGNOSIS — Z79899 Other long term (current) drug therapy: Secondary | ICD-10-CM | POA: Insufficient documentation

## 2020-02-22 DIAGNOSIS — R079 Chest pain, unspecified: Secondary | ICD-10-CM | POA: Diagnosis present

## 2020-02-22 DIAGNOSIS — R0602 Shortness of breath: Secondary | ICD-10-CM | POA: Insufficient documentation

## 2020-02-22 DIAGNOSIS — R002 Palpitations: Secondary | ICD-10-CM | POA: Diagnosis not present

## 2020-02-22 DIAGNOSIS — R0789 Other chest pain: Secondary | ICD-10-CM

## 2020-02-22 LAB — BASIC METABOLIC PANEL
Anion gap: 11 (ref 5–15)
BUN: 11 mg/dL (ref 6–20)
CO2: 27 mmol/L (ref 22–32)
Calcium: 9 mg/dL (ref 8.9–10.3)
Chloride: 102 mmol/L (ref 98–111)
Creatinine, Ser: 0.56 mg/dL (ref 0.44–1.00)
GFR, Estimated: >60 mL/min (ref >60)
Glucose, Bld: 124 mg/dL — ABNORMAL HIGH (ref 70–99)
Potassium: 4.4 mmol/L (ref 3.5–5.1)
Sodium: 140 mmol/L (ref 135–145)

## 2020-02-22 LAB — CBC
HCT: 40.6 % (ref 36.0–46.0)
Hemoglobin: 12.1 g/dL (ref 12.0–15.0)
MCH: 27.8 pg (ref 26.0–34.0)
MCHC: 29.8 g/dL — ABNORMAL LOW (ref 30.0–36.0)
MCV: 93.1 fL (ref 80.0–100.0)
Platelets: 274 10*3/uL (ref 150–400)
RBC: 4.36 MIL/uL (ref 3.87–5.11)
RDW: 16.6 % — ABNORMAL HIGH (ref 11.5–15.5)
WBC: 9.2 10*3/uL (ref 4.0–10.5)
nRBC: 0 % (ref 0.0–0.2)

## 2020-02-22 LAB — TROPONIN I (HIGH SENSITIVITY)
Troponin I (High Sensitivity): 4 ng/L (ref <18)
Troponin I (High Sensitivity): 5 ng/L (ref <18)

## 2020-02-22 LAB — TSH: TSH: 31.779 u[IU]/mL — ABNORMAL HIGH (ref 0.350–4.500)

## 2020-02-22 LAB — FIBRIN DERIVATIVES D-DIMER (ARMC ONLY): Fibrin derivatives D-dimer (ARMC): 358.94 ng/mL (FEU) (ref 0.00–499.00)

## 2020-02-22 MED ORDER — LEVOTHYROXINE SODIUM 50 MCG PO TABS: 50.0000 ug | ORAL_TABLET | Freq: Every day | ORAL | 0 refills | Status: DC

## 2020-02-22 MED ORDER — CLONAZEPAM 0.5 MG PO TABS
0.5000 mg | ORAL_TABLET | Freq: Once | ORAL | Status: AC
  Administered 2020-02-22: 0.5 mg via ORAL
  Filled 2020-02-22: qty 1

## 2020-02-22 MED ORDER — AMLODIPINE BESYLATE 5 MG PO TABS
10.0000 mg | ORAL_TABLET | Freq: Once | ORAL | Status: AC
  Administered 2020-02-22: 10 mg via ORAL
  Filled 2020-02-22: qty 2

## 2020-02-22 MED ORDER — OXYCODONE-ACETAMINOPHEN 5-325 MG PO TABS
1.0000 | ORAL_TABLET | Freq: Once | ORAL | Status: AC
  Administered 2020-02-22: 1 via ORAL
  Filled 2020-02-22: qty 1

## 2020-02-22 NOTE — Discharge Instructions (Signed)
Please increase your synthroid medication by daily.  I have ordered a rx sent to your pharmacy that can be added to your current regimen.  Follow up with your PCP.  I have also ordered a referral to cardiology for evaluation of your chest discomfort and palpitations.  Please call to schedule a follow up appointment.  Return for any additional questions or concerns.

## 2020-02-22 NOTE — ED Provider Notes (Signed)
[facility] Emergency Department Provider Note    Event Date/Time   First MD Initiated Contact with Patient 02/22/20 1130     (approximate)  I have reviewed the triage vital signs and the nursing notes.   HISTORY  Chief Complaint Chest Pain and Palpitations    HPI Rhonda Palmer is a 50 y.o. female the below listed past medical history presents to the ER for evaluation of midsternal nonradiating chest pain and pressure associated with palpitations occurred this morning also has some shortness of breath associated.  No fevers.  Never had symptoms like this before several days ago.  Describes the pain as a pressure.  No diaphoresis.  Not on any blood thinners.  Has not been feeling like she is wheezing does not have a cough.    Past Medical History:  Diagnosis Date  . Anxiety   . Asthma   . Bipolar 1 disorder (HCC)   . Edema leg for twenty years per patient   taking lasix for edema  . Fibromyalgia   . Ovarian cyst   . Schizo affective schizophrenia (HCC)   . Thyroid disease    Family History  Problem Relation Age of Onset  . Heart failure Mother   . Bladder Cancer Neg Hx   . Kidney cancer Neg Hx    Past Surgical History:  Procedure Laterality Date  . ABDOMINAL HYSTERECTOMY    . ANKLE SURGERY     pt. states long time ago   Patient Active Problem List   Diagnosis Date Noted  . Fall 03/27/2017  . Long term (current) use of opiate analgesic 06/26/2016  . Long term prescription opiate use 06/26/2016  . Opiate use 06/26/2016  . Obesity 01/17/2016  . History of opioid abuse (HCC) 01/17/2016  . Asthma 01/17/2016  . Mixed hyperlipidemia 10/09/2015  . Hypokalemia 10/09/2015  . Headache above the eye region 06/16/2013  . Edema extremities 05/18/2013  . 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 GERD (gastroesophageal reflux disease) 04/11/2013  . Personality disorder (HCC) 10/16/2012  . Chronic pain syndrome 06/18/2012  . S/P  ankle fusion 05/21/2012  . Schizoaffective disorder, bipolar type (HCC) 02/20/2012  . 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 Hypothyroidism 11/16/2002      Prior to Admission medications   Medication Sig Start Date End Date Taking? Authorizing Provider  levothyroxine (SYNTHROID) 50 MCG tablet Take 1 tablet (50 mcg total) by mouth daily. 02/22/20 02/21/21 Yes Willy Eddy, MD  albuterol (PROVENTIL HFA;VENTOLIN HFA) 108 (90 Base) MCG/ACT inhaler Inhale 2 puffs into the lungs every 6 (six) hours as needed for wheezing or shortness of breath.    [provider]  ARIPiprazole ER (ABILIFY MAINTENA) 300 MG SRER injection Inject 300 mg into the muscle every 28 (twenty-eight) days.    [provider]  Baclofen 5 MG TABS Take 5 mg by mouth 3 (three) times daily as needed. 01/23/19   Enid Derry, PA-C  clonazePAM (KLONOPIN) 0.5 MG tablet Take 0.5 mg by mouth 3 (three) times daily.  02/18/18   [provider]  cyclobenzaprine (FLEXERIL) 5 MG tablet Take 1 tablet (5 mg total) by mouth 3 (three) times daily as needed. 01/22/19   Menshew, Charlesetta Ivory, PA-C  diclofenac (CATAFLAM) 50 MG tablet Take 1 tablet (50 mg total) by mouth 3 (three) times daily. 01/23/19   Enid Derry, PA-C  Diclofenac Sodium 1 % CREA  Apply 1 Units topically 2 (two) times daily as needed. 01/23/19   Enid Derry, PA-C  EPINEPHrine (EPIPEN 2-PAK) 0.3 mg/0.3 mL IJ SOAJ injection Inject 0.3 mLs (0.3 mg total) into the muscle as needed for anaphylaxis. 02/23/18   Schaevitz, Myra Rude, MD  furosemide (LASIX) 20 MG tablet Take 2 tablets (40 mg total) by mouth daily. 05/13/16   Nita Sickle, MD  gabapentin (NEURONTIN) 300 MG capsule Take 300 mg by mouth 3 (three) times daily. 12/16/17   [provider]  JARDIANCE 25 MG TABS tablet Take 25 mg by mouth daily. 01/13/18   [provider]   ketorolac (TORADOL) 10 MG tablet Take 1 tablet (10 mg total) by mouth every 8 (eight) hours. 01/22/19   Menshew, Charlesetta Ivory, PA-C  Lactulose 20 GM/30ML SOLN Take 15 mLs (10 g total) by mouth daily. Until bowel function normal 11/26/18   Jene Every, MD  levothyroxine (SYNTHROID) 150 MCG tablet Take 300 mcg by mouth daily. 10/05/18   [provider]  LINZESS 72 MCG capsule Take 72 mg by mouth daily. 01/21/18   [provider]  QUEtiapine (SEROQUEL) 100 MG tablet Take 100 mg by mouth at bedtime. 10/05/18   [provider]  simvastatin (ZOCOR) 20 MG tablet Take 20 mg by mouth at bedtime. 10/05/18   [provider]  traMADol (ULTRAM) 50 MG tablet Take 50 mg by mouth 4 (four) times daily as needed. 11/19/18   [provider]  venlafaxine (EFFEXOR) 100 MG tablet Take 100 mg by mouth 2 (two) times daily.    [provider]  zolpidem (AMBIEN) 10 MG tablet Take 10 mg by mouth at bedtime. 10/11/18   [provider]    Allergies Codeine, Sulfa antibiotics, Penicillins, and Aspirin    Social History Social History   Tobacco Use  . Smoking status: Never Smoker  . Smokeless tobacco: Never Used  Vaping Use  . Vaping Use: Never used  Substance Use Topics  . Alcohol use: No  . Drug use: No    Review of Systems Patient denies headaches, rhinorrhea, blurry vision, numbness, shortness of breath, chest pain, edema, cough, abdominal pain, nausea, vomiting, diarrhea, dysuria, fevers, rashes or hallucinations unless otherwise stated above in HPI. ____________________________________________   PHYSICAL EXAM:  VITAL SIGNS: Vitals:   02/22/20 1243 02/22/20 1450  BP: (!) 172/113 (!) 144/104  Pulse: 92 91  Resp: 16 16  Temp:    SpO2: 98% 95%    Constitutional: Alert and oriented.  Eyes: Conjunctivae are normal.  Head: Atraumatic. Nose: No congestion/rhinnorhea. Mouth/Throat: Mucous membranes are moist.   Neck: No stridor. Painless  ROM.  Cardiovascular: Normal rate, regular rhythm. Grossly normal heart sounds.  Good peripheral circulation. Respiratory: Normal respiratory effort.  No retractions. Lungs CTAB. Gastrointestinal: Soft and nontender. No distention. No abdominal bruits. No CVA tenderness. Genitourinary:  Musculoskeletal: No lower extremity tenderness nor edema.  No joint effusions. Neurologic:  Normal speech and language. No gross focal neurologic deficits are appreciated. No facial droop Skin:  Skin is warm, dry and intact. No rash noted. Psychiatric: Mood and affect are anxious. Speech and behavior are normal.  ____________________________________________   LABS (all labs ordered are listed, but only abnormal results are displayed)  Results for orders placed or performed during the hospital encounter of 02/22/20 (from the past 24 hour(s))  Basic metabolic panel     Status: Abnormal   Collection Time: 02/22/20  8:28 AM  Result Value Ref Range   Sodium 140  135 - 145 mmol/L   Potassium 4.4 3.5 - 5.1 mmol/L   Chloride 102 98 - 111 mmol/L   CO2 27 22 - 32 mmol/L   Glucose, Bld 124 (H) 70 - 99 mg/dL   BUN 11 6 - 20 mg/dL   Creatinine, Ser 1.02 0.44 - 1.00 mg/dL   Calcium 9.0 8.9 - 58.5 mg/dL   GFR, Estimated >27 >78 mL/min   Anion gap 11 5 - 15  CBC     Status: Abnormal   Collection Time: 02/22/20  8:28 AM  Result Value Ref Range   WBC 9.2 4.0 - 10.5 K/uL   RBC 4.36 3.87 - 5.11 MIL/uL   Hemoglobin 12.1 12.0 - 15.0 g/dL   HCT 24.2 35.3 - 61.4 %   MCV 93.1 80.0 - 100.0 fL   MCH 27.8 26.0 - 34.0 pg   MCHC 29.8 (L) 30.0 - 36.0 g/dL   RDW 43.1 (H) 54.0 - 08.6 %   Platelets 274 150 - 400 K/uL   nRBC 0.0 0.0 - 0.2 %  Troponin I (High Sensitivity)     Status: None   Collection Time: 02/22/20  8:28 AM  Result Value Ref Range   Troponin I (High Sensitivity) 4 <18 ng/L  TSH     Status: Abnormal   Collection Time: 02/22/20  8:28 AM  Result Value Ref Range   TSH 31.779 (H) 0.350 - 4.500 uIU/mL   Troponin I (High Sensitivity)     Status: None   Collection Time: 02/22/20  1:09 PM  Result Value Ref Range   Troponin I (High Sensitivity) 5 <18 ng/L  Fibrin derivatives D-Dimer ([facility] only)     Status: None   Collection Time: 02/22/20  1:09 PM  Result Value Ref Range   Fibrin derivatives D-dimer ([facility]) 358.94 0.00 - 499.00 ng/mL (FEU)   ____________________________________________  EKG My review and personal interpretation at Time: 8:24   Indication: palpitations  Rate: 100  Rhythm: sinus Axis: normal Other: normal intervals, no stemi ____________________________________________  RADIOLOGY  I personally reviewed all radiographic images ordered to evaluate for the above acute complaints and reviewed radiology reports and findings.  These findings were personally discussed with the patient.  Please see medical record for radiology report.  ____________________________________________   PROCEDURES  Procedure(s) performed:  Procedures    Critical Care performed: no ____________________________________________   INITIAL IMPRESSION / ASSESSMENT AND PLAN / ED COURSE  Pertinent labs & imaging results that were available during my care of the patient were reviewed by me and considered in my medical decision making (see chart for details).   DDX: Dysrhythmia, pleurisy, bronchitis, CHF, ACS, PE, anxiety, hyperthyroid  Lorinda Copland is a 50 y.o. who presents to the ED with presentation as described above.  Patient is anxious appearing but nontoxic.  Her EKG is nonischemic.  Symptoms seem atypical for ACS initial troponin is negative given her age and risk factors will observe on monitor given her complaint of palpitations.  On telemetry she is showing sinus rhythm in the 80s and 90s.  Chest x-ray without any evidence of infiltrate.  Her D-dimer is negative and she is low risk by Wells criteria is not consistent with PE or DVT.  Clinical Course as of 02/22/20 1518  Wed Feb 22, 2020  1434 Troponin negative not consistent with ACS. She is notably hypertensive but states that she missed her morning blood pressure medications. We will give her dose of blood pressure medication continue to observe make  sure she is not having any discomfort. She is reassessed and asymptomatic at this time. As well as blood pressure downtrending remained stable be appropriate for outpatient follow-up with cardiology. [PR]    Clinical Course User Index [PR] Willy Eddy, MD    The patient was evaluated in Emergency Department today for the symptoms described in the history of present illness. He/she was evaluated in the context of the global COVID-19 pandemic, which necessitated consideration that the patient might be at risk for infection with the SARS-CoV-2 virus that causes COVID-19. Institutional protocols and algorithms that pertain to the evaluation of patients at risk for COVID-19 are in a state of rapid change based on information released by regulatory bodies including the CDC and federal and state organizations. These policies and algorithms were followed during the patient's care in the ED.  As part of my medical decision making, I reviewed the following data within the electronic MEDICAL RECORD NUMBER Nursing notes reviewed and incorporated, Labs reviewed, notes from prior ED visits and [place] Controlled Substance Database   ____________________________________________   FINAL CLINICAL IMPRESSION(S) / ED DIAGNOSES  Final diagnoses:  Atypical chest pain      NEW MEDICATIONS STARTED DURING THIS VISIT:  New Prescriptions   LEVOTHYROXINE (SYNTHROID) 50 MCG TABLET    Take 1 tablet (50 mcg total) by mouth daily.     Note:  This document was prepared using Dragon voice recognition software and may include unintentional dictation errors.    Willy Eddy, MD 02/22/20 [phone]

## 2020-02-22 NOTE — ED Triage Notes (Signed)
Pt to ED via [facility] with c/o chest discomfort and palpitations. Per EMS pt awakened from sleep with palpitations. Per EMS pt with HR of 96 and NSR.   136 (palp)  Per EMS pt took 500mg  BC Goody powder PTA.  112/56 After 1 spray SL Nitro  7/10 CP substernal and non radiating x 3-4 days.

## 2020-03-01 ENCOUNTER — Encounter: Payer: Self-pay | Admitting: Cardiology

## 2020-03-01 ENCOUNTER — Other Ambulatory Visit: Payer: Self-pay

## 2020-03-01 ENCOUNTER — Ambulatory Visit (INDEPENDENT_AMBULATORY_CARE_PROVIDER_SITE_OTHER): Payer: Medicaid Other | Admitting: Cardiology

## 2020-03-01 ENCOUNTER — Telehealth: Payer: Self-pay | Admitting: Cardiology

## 2020-03-01 ENCOUNTER — Ambulatory Visit (INDEPENDENT_AMBULATORY_CARE_PROVIDER_SITE_OTHER): Payer: Medicaid Other

## 2020-03-01 VITALS — BP 148/116 | HR 97 | Ht 61.0 in | Wt 346.4 lb

## 2020-03-01 DIAGNOSIS — R002 Palpitations: Secondary | ICD-10-CM

## 2020-03-01 DIAGNOSIS — I10 Essential (primary) hypertension: Secondary | ICD-10-CM | POA: Diagnosis not present

## 2020-03-01 DIAGNOSIS — R072 Precordial pain: Secondary | ICD-10-CM

## 2020-03-01 DIAGNOSIS — R079 Chest pain, unspecified: Secondary | ICD-10-CM

## 2020-03-01 MED ORDER — NITROGLYCERIN 0.4 MG SL SUBL: 0.4000 mg | SUBLINGUAL_TABLET | SUBLINGUAL | 0 refills | Status: DC | PRN

## 2020-03-01 NOTE — Progress Notes (Signed)
Cardiology Office Note:    Date:  03/01/2020   ID:  Rhonda Palmer, DOB 1970/03/09, MRN [id number]  PCP:  Grayland Jack, FNP  [facility] Cardiologist:  No primary care provider on file.  [facility] Electrophysiologist:  None   Referring MD: Grayland Jack, *   Chief Complaint  Patient presents with  . [facility] follow up    Patient c/o shortness of breath, chest pain and rapid heart beats in the evening.  Medications reviewed by the patient verbally.     History of Present Illness:    Rhonda Palmer is a 50 y.o. female with a hx of hypertension, hyperlipidemia, diabetes, obesity, anxiety, bipolar disorder, who presented to chest pain and palpitations.  Patient complains of chest discomfort and elevated heart rates over the past 6 weeks or so.  Symptoms are not usually associated with exertion.  Symptoms of palpitations typically occur when patient is about to go to sleep or wakes her up from sleep.  Symptoms will call 4 days out of the week and can last for hours.  Also endorses chest pressure which she rates as 7 out of 10 in severity.  She has shortness of breath with exertion which she attributes to her weight.  Was seen in the ED 02/22/2020, work-up with EKG and troponins with no evidence of ischemia.  Was discharged with recommendations for outpatient follow-up with cardiology.  Denies smoking or history of heart disease.  Past Medical History:  Diagnosis Date  . Anxiety   . Asthma   . Bipolar 1 disorder (HCC)   . Edema leg for twenty years per patient   taking lasix for edema  . Fibromyalgia   . Ovarian cyst   . Schizo affective schizophrenia (HCC)   . Thyroid disease     Past Surgical History:  Procedure Laterality Date  . ABDOMINAL HYSTERECTOMY    . ANKLE SURGERY     pt. states long time ago    Current Medications: Current Meds  Medication Sig  . albuterol (PROVENTIL HFA;VENTOLIN HFA) 108 (90 Base) MCG/ACT inhaler Inhale 2 puffs into the  lungs every 6 (six) hours as needed for wheezing or shortness of breath.  . ARIPiprazole ER (ABILIFY MAINTENA) 300 MG SRER injection Inject 300 mg into the muscle every 28 (twenty-eight) days.  Julianne Rice INITIO 675 MG/2.4ML prefilled syringe Inject 2.4 ml intramuscularly single dose  . atorvastatin (LIPITOR) 80 MG tablet TAKE ONE TABLET BY MOUTH QHS.**THIS REPLACES SIMVASTATIN**  . clonazePAM (KLONOPIN) 0.5 MG tablet Take 0.5 mg by mouth 3 (three) times daily.   Marland Kitchen glimepiride (AMARYL) 4 MG tablet Take 4 mg by mouth daily.  . IBU 800 MG tablet Take 800 mg by mouth 2 (two) times daily as needed.  . Levothyroxine Sodium 200 MCG CAPS Take 200 mcg by mouth daily.  Marland Kitchen losartan (COZAAR) 100 MG tablet Take 100 mg by mouth daily.  . metFORMIN (GLUCOPHAGE) 500 MG tablet TAKE ONE TABLET (500 MG) BY MOUTH TWICE DAILY  . ondansetron (ZOFRAN) 4 MG tablet Take 4 mg by mouth every 8 (eight) hours as needed.  . pantoprazole (PROTONIX) 40 MG tablet Take 40 mg by mouth daily.  . propranolol (INDERAL) 20 MG tablet Take 20 mg by mouth 3 (three) times daily.  . QUEtiapine (SEROQUEL) 100 MG tablet Take 100 mg by mouth at bedtime.  . simvastatin (ZOCOR) 20 MG tablet Take 20 mg by mouth at bedtime.  . torsemide (DEMADEX) 20 MG tablet Take 20 mg  by mouth daily.  Marland Kitchen ZOLOFT 50 MG tablet Take 50 mg by mouth daily.     Allergies:   Codeine, Sulfa antibiotics, Penicillins, and Aspirin   Social History   Socioeconomic History  . Marital status: Married    Spouse name: Not on file  . Number of children: Not on file  . Years of education: Not on file  . Highest education level: Not on file  Occupational History  . Not on file  Tobacco Use  . Smoking status: Never Smoker  . Smokeless tobacco: Never Used  Vaping Use  . Vaping Use: Never used  Substance and Sexual Activity  . Alcohol use: No  . Drug use: No  . Sexual activity: Not on file  Other Topics Concern  . Not on file  Social History Narrative  . Not on  file   Social Determinants of Health   Financial Resource Strain: Not on file  Food Insecurity: Not on file  Transportation Needs: Not on file  Physical Activity: Not on file  Stress: Not on file  Social Connections: Not on file     Family History: The patient's family history includes Heart failure in her mother. There is no history of Bladder Cancer or Kidney cancer.  ROS:   Please see the history of present illness.     All other systems reviewed and are negative.  EKGs/Labs/Other Studies Reviewed:    The following studies were reviewed today:   EKG:  EKG is  ordered today.  The ekg ordered today demonstrates sinus rhythm  Recent Labs: 11/20/2019: ALT 18 02/22/2020: BUN 11; Creatinine, Ser 0.56; Hemoglobin 12.1; Platelets 274; Potassium 4.4; Sodium 140; TSH 31.779  Recent Lipid Panel No results found for: CHOL, TRIG, HDL, CHOLHDL, VLDL, LDLCALC, LDLDIRECT   Risk Assessment/Calculations:      Physical Exam:    VS:  BP (!) 148/116 (BP Location: Right Arm, Patient Position: Sitting, Cuff Size: Large)   Pulse 97   Ht 5\' 1"  (1.549 m)   Wt (!) 346 lb 6 oz (157.1 kg)   SpO2 98%   BMI 65.45 kg/m     Wt Readings from Last 3 Encounters:  03/01/20 (!) 346 lb 6 oz (157.1 kg)  02/22/20 300 lb (136.1 kg)  11/20/19 300 lb (136.1 kg)     GEN:  Well nourished, well developed in no acute distress HEENT: Normal NECK: No JVD; No carotid bruits LYMPHATICS: No lymphadenopathy CARDIAC: RRR, no murmurs, rubs, gallops RESPIRATORY:  Clear to auscultation without rales, wheezing or rhonchi  ABDOMEN: Soft, non-tender, non-distended MUSCULOSKELETAL:  No edema; No deformity  SKIN: Warm and dry NEUROLOGIC:  Alert and oriented x 3 PSYCHIATRIC:  Normal affect   ASSESSMENT:    1. Palpitations   2. Precordial pain   3. Morbid obesity (HCC)   4. Primary hypertension   5. Chest pain, unspecified type    PLAN:    In order of problems listed above:  1. History of palpitations,  placed 2-week cardiac monitor to evaluate any significant arrhythmias. 2. Patient with chest pain, risk factors of hypertension, hyperlipidemia, obesity.  Get echocardiogram, get Lexiscan Myoview. 3. Morbid obesity, low-calorie diet, weight loss recommended. 4. Hypertension, continue BP meds for now.  Consider titration if stays elevated at follow-up visit.  Follow-up after cardiac testing.   Shared Decision Making/Informed Consent The risks [chest pain, shortness of breath, cardiac arrhythmias, dizziness, blood pressure fluctuations, myocardial infarction, stroke/transient ischemic attack, nausea, vomiting, allergic reaction, radiation exposure, metallic taste sensation  and life-threatening complications (estimated to be 1 in 10,000)], benefits (risk stratification, diagnosing coronary artery disease, treatment guidance) and alternatives of a nuclear stress test were discussed in detail with Ms. Bulow and she agrees to proceed.    Medication Adjustments/Labs and Tests Ordered: Current medicines are reviewed at length with the patient today.  Concerns regarding medicines are outlined above.  Orders Placed This Encounter  Procedures  . NM Myocar Multi W/Spect W/Wall Motion / EF  . LONG TERM MONITOR (3-14 DAYS)  . EKG 12-Lead  . ECHOCARDIOGRAM COMPLETE   No orders of the defined types were placed in this encounter.   Patient Instructions  Medication Instructions:  Your physician recommends that you continue on your current medications as directed. Please refer to the Current Medication list given to you today.  *If you need a refill on your cardiac medications before your next appointment, please call your pharmacy*   Lab Work: None Ordered If you have labs (blood work) drawn today and your tests are completely normal, you will receive your results only by: Marland Kitchen MyChart Message (if you have MyChart) OR . A paper copy in the mail If you have any lab test that is abnormal or we need to  change your treatment, we will call you to review the results.   Testing/Procedures:  1.  Your physician has requested that you have an echocardiogram. Echocardiography is a painless test that uses sound waves to create images of your heart. It provides your doctor with information about the size and shape of your heart and how well your heart's chambers and valves are working. This procedure takes approximately one hour. There are no restrictions for this procedure.  2.  Your physician has recommended that you wear a Zio monitor for 2 weeks. This monitor is a medical device that records the heart's electrical activity. Doctors most often use these monitors to diagnose arrhythmias. Arrhythmias are problems with the speed or rhythm of the heartbeat. The monitor is a small device applied to your chest. You can wear one while you do your normal daily activities. While wearing this monitor if you have any symptoms to push the button and record what you felt. Once you have worn this monitor for the period of time provider prescribed (Usually 14 days), you will return the monitor device in the postage paid box. Once it is returned they will download the data collected and provide [place] with a report which the provider will then review and we will call you with those results. Important tips:  3.  [facility] MYOVIEW    Your caregiver has ordered a Stress Test with nuclear imaging. The purpose of this test is to evaluate the blood supply to your heart muscle. This procedure is referred to as a "Non-Invasive Stress Test." This is because other than having an IV started in your vein, nothing is inserted or "invades" your body. Cardiac stress tests are done to find areas of poor blood flow to the heart by determining the extent of coronary artery disease (CAD). Some patients exercise on a treadmill, which naturally increases the blood flow to your heart, while others who are  unable to walk on a treadmill due to physical  limitations have a pharmacologic/chemical stress agent called Lexiscan . This medicine will mimic walking on a treadmill by temporarily increasing your coronary blood flow.      PLEASE REPORT TO [facility] MEDICAL MALL ENTRANCE   THE VOLUNTEERS AT THE FIRST DESK WILL DIRECT YOU WHERE  TO GO   *Please note: these test will take two appointments over two days      Date of Procedure:_____________________________________   Arrival Time for Procedure:______________________________    PLEASE NOTIFY THE OFFICE AT LEAST 24 HOURS IN ADVANCE IF YOU ARE UNABLE TO KEEP YOUR APPOINTMENT.  [phone]  PLEASE NOTIFY NUCLEAR MEDICINE AT [facility] AT LEAST 24 HOURS IN ADVANCE IF YOU ARE UNABLE TO KEEP YOUR APPOINTMENT. [phone]       How to prepare for your Myoview test:         _XX__:  Hold diabetes medication the morning of procedure:  glimepiride (AMARYL) 4 MG tablet, metFORMIN (GLUCOPHAGE) 500 MG tablet  _XX___:  Hold betablocker(s) night before procedure and morning of procedure:    propranolol (INDERAL) 20 MG tablet  _XX___:  Hold other medications as follows:  torsemide (DEMADEX) 20 MG tablet    1. Do not eat or drink after midnight  2. No caffeine for 24 hours prior to test  3. No smoking 24 hours prior to test.  4. Unless instructed otherwise, Take your medication with a small sips of water.    5.         Ladies, please do not wear dresses. Skirts or pants are appropriate. Please wear a short sleeve shirt.  6. No perfume, cologne or lotion.  7. Wear comfortable walking shoes. No heels!   1. Avoid showering during the first 24 hours of wearing the monitor. 2. Avoid excessive sweating to help maximize wear time. 3. Do not submerge the device, no hot tubs, and no swimming pools. 4. Keep any lotions or oils away from the patch. 5. After 24 hours you may shower with the patch on. Take brief showers with your back facing the shower head.  6. Do not remove patch once it has been placed  because that will interrupt data and decrease adhesive wear time. 7. Push the button when you have any symptoms and write down what you were feeling. 8. Once you have completed wearing your monitor, remove and place into box which has postage paid and place in your outgoing mailbox.  9. If for some reason you have misplaced your box then call our office and we can provide another box and/or mail it off for you.         Follow-Up: At [facility] you and your health needs are our priority.  As part of our continuing mission to provide you with exceptional heart care, we have created designated Provider Care Teams.  These Care Teams include your primary Cardiologist (physician) and Advanced Practice Providers (APPs -  Physician Assistants and Nurse Practitioners) who all work together to provide you with the care you need, when you need it.  We recommend signing up for the patient portal called "MyChart".  Sign up information is provided on this After Visit Summary.  MyChart is used to connect with patients for Virtual Visits (Telemedicine).  Patients are able to view lab/test results, encounter notes, upcoming appointments, etc.  Non-urgent messages can be sent to your provider as well.   To learn more about what you can do with MyChart, go to [email or web address].    Your next appointment:   6 week(s)  The format for your next appointment:   In Person  Provider:   Debbe Odea, MD   Other Instructions     Signed, Debbe Odea, MD  03/01/2020 1:12 PM    [facility]

## 2020-03-01 NOTE — Patient Instructions (Signed)
Medication Instructions:  Your physician recommends that you continue on your current medications as directed. Please refer to the Current Medication list given to you today.  *If you need a refill on your cardiac medications before your next appointment, please call your pharmacy*   Lab Work: None Ordered If you have labs (blood work) drawn today and your tests are completely normal, you will receive your results only by: Marland Kitchen MyChart Message (if you have MyChart) OR . A paper copy in the mail If you have any lab test that is abnormal or we need to change your treatment, we will call you to review the results.   Testing/Procedures:  1.  Your physician has requested that you have an echocardiogram. Echocardiography is a painless test that uses sound waves to create images of your heart. It provides your doctor with information about the size and shape of your heart and how well your heart's chambers and valves are working. This procedure takes approximately one hour. There are no restrictions for this procedure.  2.  Your physician has recommended that you wear a Zio monitor for 2 weeks. This monitor is a medical device that records the heart's electrical activity. Doctors most often use these monitors to diagnose arrhythmias. Arrhythmias are problems with the speed or rhythm of the heartbeat. The monitor is a small device applied to your chest. You can wear one while you do your normal daily activities. While wearing this monitor if you have any symptoms to push the button and record what you felt. Once you have worn this monitor for the period of time provider prescribed (Usually 14 days), you will return the monitor device in the postage paid box. Once it is returned they will download the data collected and provide [place] with a report which the provider will then review and we will call you with those results. Important tips:  3.  [facility] MYOVIEW    Your caregiver has ordered a Stress Test with  nuclear imaging. The purpose of this test is to evaluate the blood supply to your heart muscle. This procedure is referred to as a "Non-Invasive Stress Test." This is because other than having an IV started in your vein, nothing is inserted or "invades" your body. Cardiac stress tests are done to find areas of poor blood flow to the heart by determining the extent of coronary artery disease (CAD). Some patients exercise on a treadmill, which naturally increases the blood flow to your heart, while others who are  unable to walk on a treadmill due to physical limitations have a pharmacologic/chemical stress agent called Lexiscan . This medicine will mimic walking on a treadmill by temporarily increasing your coronary blood flow.      PLEASE REPORT TO [facility] MEDICAL MALL ENTRANCE   THE VOLUNTEERS AT THE FIRST DESK WILL DIRECT YOU WHERE TO GO   *Please note: these test will take two appointments over two days      Date of Procedure:_____________________________________   Arrival Time for Procedure:______________________________    PLEASE NOTIFY THE OFFICE AT LEAST 24 HOURS IN ADVANCE IF YOU ARE UNABLE TO KEEP YOUR APPOINTMENT.  [phone]  PLEASE NOTIFY NUCLEAR MEDICINE AT [facility] AT LEAST 24 HOURS IN ADVANCE IF YOU ARE UNABLE TO KEEP YOUR APPOINTMENT. [phone]       How to prepare for your Myoview test:         _XX__:  Hold diabetes medication the morning of procedure:  glimepiride (AMARYL) 4 MG tablet, metFORMIN (GLUCOPHAGE) 500  MG tablet  _XX___:  Hold betablocker(s) night before procedure and morning of procedure:    propranolol (INDERAL) 20 MG tablet  _XX___:  Hold other medications as follows:  torsemide (DEMADEX) 20 MG tablet    1. Do not eat or drink after midnight  2. No caffeine for 24 hours prior to test  3. No smoking 24 hours prior to test.  4. Unless instructed otherwise, Take your medication with a small sips of water.    5.         Ladies, please do not wear  dresses. Skirts or pants are appropriate. Please wear a short sleeve shirt.  6. No perfume, cologne or lotion.  7. Wear comfortable walking shoes. No heels!   1. Avoid showering during the first 24 hours of wearing the monitor. 2. Avoid excessive sweating to help maximize wear time. 3. Do not submerge the device, no hot tubs, and no swimming pools. 4. Keep any lotions or oils away from the patch. 5. After 24 hours you may shower with the patch on. Take brief showers with your back facing the shower head.  6. Do not remove patch once it has been placed because that will interrupt data and decrease adhesive wear time. 7. Push the button when you have any symptoms and write down what you were feeling. 8. Once you have completed wearing your monitor, remove and place into box which has postage paid and place in your outgoing mailbox.  9. If for some reason you have misplaced your box then call our office and we can provide another box and/or mail it off for you.         Follow-Up: At [facility], you and your health needs are our priority.  As part of our continuing mission to provide you with exceptional heart care, we have created designated Provider Care Teams.  These Care Teams include your primary Cardiologist (physician) and Advanced Practice Providers (APPs -  Physician Assistants and Nurse Practitioners) who all work together to provide you with the care you need, when you need it.  We recommend signing up for the patient portal called "MyChart".  Sign up information is provided on this After Visit Summary.  MyChart is used to connect with patients for Virtual Visits (Telemedicine).  Patients are able to view lab/test results, encounter notes, upcoming appointments, etc.  Non-urgent messages can be sent to your provider as well.   To learn more about what you can do with MyChart, go to [email or web address].    Your next appointment:   6 week(s)  The format for your next  appointment:   In Person  Provider:   Debbe Odea, MD   Other Instructions

## 2020-03-01 NOTE — Telephone Encounter (Signed)
Called to give patient the following recommendations and there was no VM available. Will try again tomorrow.   Debbe Odea, MD  You 45 minutes ago (4:22 PM)   [facility] for patient to take nitro sublingual prn for chest pain while we perform cardiac testing. Please prescribe 30tabs, no refills. thanks

## 2020-03-01 NOTE — Telephone Encounter (Signed)
Patient seen today in office and states she forgot to ask provider for something for chest pain.  She states she is very uncomfortable and wants to know what she can take.

## 2020-03-02 MED ORDER — NITROGLYCERIN 0.4 MG SL SUBL: 0.4000 mg | SUBLINGUAL_TABLET | SUBLINGUAL | 0 refills | Status: DC | PRN

## 2020-03-02 NOTE — Addendum Note (Signed)
Addended by: Gibson Ramp on: 03/02/2020 09:35 AM   Modules accepted: Orders

## 2020-03-02 NOTE — Telephone Encounter (Signed)
Called patient and relayed the below recommendations. Patient verbalized understanding and agreed with plan.

## 2020-03-02 NOTE — Telephone Encounter (Signed)
Patient returning call.

## 2020-03-03 ENCOUNTER — Other Ambulatory Visit: Payer: Self-pay

## 2020-03-03 ENCOUNTER — Emergency Department
Admission: EM | Admit: 2020-03-03 | Discharge: 2020-03-03 | Disposition: A | Discharge status: Home / Self Care | Payer: Medicaid Other | Attending: Emergency Medicine | Admitting: Emergency Medicine

## 2020-03-03 ENCOUNTER — Encounter: Payer: Self-pay | Admitting: Intensive Care

## 2020-03-03 ENCOUNTER — Emergency Department: Payer: Medicaid Other

## 2020-03-03 ENCOUNTER — Telehealth: Payer: Self-pay | Admitting: Cardiology

## 2020-03-03 DIAGNOSIS — R0789 Other chest pain: Secondary | ICD-10-CM | POA: Diagnosis present

## 2020-03-03 DIAGNOSIS — Z7984 Long term (current) use of oral hypoglycemic drugs: Secondary | ICD-10-CM | POA: Diagnosis not present

## 2020-03-03 DIAGNOSIS — I10 Essential (primary) hypertension: Secondary | ICD-10-CM | POA: Insufficient documentation

## 2020-03-03 DIAGNOSIS — Z79899 Other long term (current) drug therapy: Secondary | ICD-10-CM | POA: Insufficient documentation

## 2020-03-03 DIAGNOSIS — J45909 Unspecified asthma, uncomplicated: Secondary | ICD-10-CM | POA: Insufficient documentation

## 2020-03-03 DIAGNOSIS — E039 Hypothyroidism, unspecified: Secondary | ICD-10-CM | POA: Insufficient documentation

## 2020-03-03 DIAGNOSIS — E119 Type 2 diabetes mellitus without complications: Secondary | ICD-10-CM | POA: Insufficient documentation

## 2020-03-03 HISTORY — DX: Type 2 diabetes mellitus without complications: E11.9

## 2020-03-03 HISTORY — DX: Essential (primary) hypertension: I10

## 2020-03-03 LAB — BASIC METABOLIC PANEL
Anion gap: 8 (ref 5–15)
BUN: 14 mg/dL (ref 6–20)
CO2: 29 mmol/L (ref 22–32)
Calcium: 8.8 mg/dL — ABNORMAL LOW (ref 8.9–10.3)
Chloride: 102 mmol/L (ref 98–111)
Creatinine, Ser: 0.61 mg/dL (ref 0.44–1.00)
GFR, Estimated: >60 mL/min (ref >60)
Glucose, Bld: 113 mg/dL — ABNORMAL HIGH (ref 70–99)
Potassium: 4 mmol/L (ref 3.5–5.1)
Sodium: 139 mmol/L (ref 135–145)

## 2020-03-03 LAB — CBC
HCT: 39.3 % (ref 36.0–46.0)
Hemoglobin: 11.8 g/dL — ABNORMAL LOW (ref 12.0–15.0)
MCH: 27.9 pg (ref 26.0–34.0)
MCHC: 30 g/dL (ref 30.0–36.0)
MCV: 92.9 fL (ref 80.0–100.0)
Platelets: 300 10*3/uL (ref 150–400)
RBC: 4.23 MIL/uL (ref 3.87–5.11)
RDW: 16.9 % — ABNORMAL HIGH (ref 11.5–15.5)
WBC: 9.5 10*3/uL (ref 4.0–10.5)
nRBC: 0 % (ref 0.0–0.2)

## 2020-03-03 LAB — TROPONIN I (HIGH SENSITIVITY): Troponin I (High Sensitivity): 4 ng/L (ref <18)

## 2020-03-03 MED ORDER — MORPHINE SULFATE (PF) 2 MG/ML IV SOLN
2.0000 mg | Freq: Once | INTRAVENOUS | Status: AC
  Administered 2020-03-03: 2 mg via INTRAVENOUS
  Filled 2020-03-03: qty 1

## 2020-03-03 MED ORDER — IOHEXOL 350 MG/ML SOLN
75.0000 mL | Freq: Once | INTRAVENOUS | Status: AC | PRN
  Administered 2020-03-03: 75 mL via INTRAVENOUS

## 2020-03-03 MED ORDER — LIDOCAINE VISCOUS HCL 2 % MT SOLN
15.0000 mL | Freq: Once | OROMUCOSAL | Status: AC
  Administered 2020-03-03: 15 mL via ORAL
  Filled 2020-03-03: qty 15

## 2020-03-03 MED ORDER — ALUM & MAG HYDROXIDE-SIMETH 200-200-20 MG/5ML PO SUSP
30.0000 mL | Freq: Once | ORAL | Status: AC
  Administered 2020-03-03: 30 mL via ORAL
  Filled 2020-03-03: qty 30

## 2020-03-03 MED ORDER — ONDANSETRON HCL 4 MG/2ML IJ SOLN
4.0000 mg | Freq: Once | INTRAMUSCULAR | Status: AC
  Administered 2020-03-03: 4 mg via INTRAVENOUS
  Filled 2020-03-03: qty 2

## 2020-03-03 NOTE — ED Notes (Signed)
EDP at bedside  

## 2020-03-03 NOTE — ED Triage Notes (Signed)
Pt c/o chest discomfort that radiates to back for two days. Worsened yesterday. Took 1 sublingual this AM with no relief. Currently wearing heart monitor

## 2020-03-03 NOTE — ED Notes (Addendum)
Pt states has been having CP that started about 2 days ago, got worse this morning. Has been wearing heart monitor since 03/01/20. Pt took NTG SL with no relief. Pt in NAD at this time. Pt already to xray and back in room.

## 2020-03-03 NOTE — Telephone Encounter (Signed)
Patient called in reporting ongoing chest pain. Was recently seen in the office for chest pain and set up outpatient stress testing and cardiac monitor. Given a Rx for SL nitro. States she has felt palpitations today and reports ongoing episodes of chest pain despite taking nitro. I advised with persistent symptoms she should be seen at closest ER as she would likely need more prompt cardiac evaluation. She was agreeable to present to the ER and thanked me for call back.

## 2020-03-03 NOTE — ED Notes (Signed)
Provided gingerale and ice for pt.

## 2020-03-03 NOTE — ED Notes (Signed)
EDP at bedside. Pt states CP is unchanged. Still 7 or 8/10, L side, non-radiating, describes as sharpness and pressure.

## 2020-03-03 NOTE — ED Provider Notes (Signed)
[facility] Emergency Department Provider Note   ____________________________________________    I have reviewed the triage vital signs and the nursing notes.   HISTORY  Chief Complaint Chest Pain     HPI Rhonda Palmer is a 50 y.o. female with history of diabetes, hypertension, additional history as below who presents with complaints of chest pain.  Patient reports she has had chest discomfort since yesterday.  This is the same pain that brought her into the emergency department on 26 January, records reviewed.  She has seen cardiology since then and has stress test scheduled, they did prescribe nitroglycerin as well which she has taken and has helped somewhat.  She describes left central anterior chest discomfort, no radiation reported to me.  No shortness of breath, no calf pain or swelling.  No back pain.  Past Medical History:  Diagnosis Date  . Anxiety   . Asthma   . Bipolar 1 disorder (HCC)   . Diabetes mellitus without complication (HCC)   . Edema leg for twenty years per patient   taking lasix for edema  . Fibromyalgia   . Hypertension   . Ovarian cyst   . Schizo affective schizophrenia (HCC)   . Thyroid disease     Patient Active Problem List   Diagnosis Date Noted  . Fall 03/27/2017  . Long term (current) use of opiate analgesic 06/26/2016  . Long term prescription opiate use 06/26/2016  . Opiate use 06/26/2016  . Obesity 01/17/2016  . History of opioid abuse (HCC) 01/17/2016  . Asthma 01/17/2016  . Mixed hyperlipidemia 10/09/2015  . Hypokalemia 10/09/2015  . Headache above the eye region 06/16/2013  . Edema extremities 05/18/2013  . 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 GERD (gastroesophageal reflux disease) 04/11/2013  . Personality disorder (HCC) 10/16/2012  . Chronic pain syndrome 06/18/2012  . S/P ankle fusion 05/21/2012  . Schizoaffective disorder, bipolar type (HCC) 02/20/2012  . 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 Hypothyroidism 11/16/2002    Past Surgical History:  Procedure Laterality Date  . ABDOMINAL HYSTERECTOMY    . ANKLE SURGERY     pt. states long time ago    Prior to Admission medications   Medication Sig Start Date End Date Taking? Authorizing Provider  albuterol (PROVENTIL HFA;VENTOLIN HFA) 108 (90 Base) MCG/ACT inhaler Inhale 2 puffs into the lungs every 6 (six) hours as needed for wheezing or shortness of breath.    [provider]  ARIPiprazole ER (ABILIFY MAINTENA) 300 MG SRER injection Inject 300 mg into the muscle every 28 (twenty-eight) days.    [provider]  ARISTADA INITIO 675 MG/2.4ML prefilled syringe Inject 2.4 ml intramuscularly single dose 02/27/20   [provider]  atorvastatin (LIPITOR) 80 MG tablet TAKE ONE TABLET BY MOUTH QHS.**THIS REPLACES SIMVASTATIN** 02/27/20   [provider]  clonazePAM (KLONOPIN) 0.5 MG tablet Take 0.5 mg by mouth 3 (three) times daily.  02/18/18   [provider]  glimepiride (AMARYL) 4 MG tablet Take 4 mg by mouth daily. 02/27/20   [provider]  IBU 800 MG tablet Take 800 mg by mouth 2 (two) times daily as needed. 12/30/19   [provider]  Levothyroxine Sodium 200 MCG CAPS Take 200 mcg by mouth daily. 02/02/20   [provider]  losartan (COZAAR) 100 MG tablet Take 100 mg by mouth daily. 02/27/20   [provider]  metFORMIN (GLUCOPHAGE) 500 MG tablet TAKE ONE TABLET (500 MG) BY MOUTH TWICE DAILY 02/27/20   [provider]  nitroGLYCERIN (NITROSTAT) 0.4 MG SL tablet Place 1 tablet (0.4 mg total) under the tongue every 5 (five) minutes as needed for chest pain. 03/02/20 05/31/20  Debbe Odea, MD  ondansetron (ZOFRAN) 4 MG tablet Take 4 mg by mouth every 8 (eight) hours as needed. 02/27/20   [provider]  pantoprazole (PROTONIX) 40  MG tablet Take 40 mg by mouth daily. 02/27/20   [provider]  propranolol (INDERAL) 20 MG tablet Take 20 mg by mouth 3 (three) times daily. 02/27/20   [provider]  QUEtiapine (SEROQUEL) 100 MG tablet Take 100 mg by mouth at bedtime. 10/05/18   [provider]  simvastatin (ZOCOR) 20 MG tablet Take 20 mg by mouth at bedtime. 10/05/18   [provider]  torsemide (DEMADEX) 20 MG tablet Take 20 mg by mouth daily. 01/23/20   [provider]  traMADol (ULTRAM) 50 MG tablet Take 50 mg by mouth 4 (four) times daily as needed. Patient not taking: Reported on 03/01/2020 11/19/18   [provider]  ZOLOFT 50 MG tablet Take 50 mg by mouth daily. 02/27/20   [provider]  zolpidem (AMBIEN) 10 MG tablet Take 10 mg by mouth at bedtime. Patient not taking: Reported on 03/01/2020 10/11/18   [provider]     Allergies Codeine, Sulfa antibiotics, Penicillins, and Aspirin  Family History  Problem Relation Age of Onset  . Heart failure Mother   . Bladder Cancer Neg Hx   . Kidney cancer Neg Hx     Social History Social History   Tobacco Use  . Smoking status: Never Smoker  . Smokeless tobacco: Never Used  Vaping Use  . Vaping Use: Never used  Substance Use Topics  . Alcohol use: No  . Drug use: No    Review of Systems  Constitutional: No fever/chills Eyes: No visual changes.  ENT: No sore throat. Cardiovascular: As above Respiratory: Denies shortness of breath. Gastrointestinal: No abdominal pain.  No nausea, no vomiting.   Genitourinary: Negative for dysuria. Musculoskeletal: Negative for back pain. Skin: Negative for rash. Neurological: Negative for headaches or weakness   ____________________________________________   PHYSICAL EXAM:  VITAL SIGNS: ED Triage Vitals  Enc Vitals Group     BP 03/03/20 1041 (!) 170/110     Pulse Rate 03/03/20 1041 (!) 102     Resp 03/03/20 1041 (!) 27     Temp 03/03/20 1041  98.4 F (36.9 C)     Temp Source 03/03/20 1041 Oral     SpO2 03/03/20 1041 96 %     Weight 03/03/20 1038 (!) 147.9 kg (326 lb)     Height 03/03/20 1038 1.549 m (5\' 1" )     Head Circumference --      Peak Flow --      Pain Score 03/03/20 1038 8     Pain Loc --      Pain Edu? --      Excl. in GC? --     Constitutional: Alert and oriented.  Eyes: Conjunctivae are normal.   Nose: No congestion/rhinnorhea. Mouth/Throat: Mucous membranes are moist.    Cardiovascular: Normal rate, regular rhythm. Grossly normal heart sounds.  Good peripheral circulation. Respiratory: Normal respiratory effort.  No retractions. Lungs CTAB. Gastrointestinal: Soft and nontender. No distention.  No CVA tenderness.  Musculoskeletal: No lower extremity tenderness nor edema.  Warm and well perfused Neurologic:  Normal speech and language. No gross focal neurologic deficits are appreciated.  Skin:  Skin is warm, dry and intact. No rash noted. Psychiatric: Mood and affect are normal. Speech and behavior are normal.  ____________________________________________   LABS (all labs ordered are listed, but only abnormal results are displayed)  Labs Reviewed  BASIC METABOLIC PANEL - Abnormal; Notable for the following components:      Result Value   Glucose, Bld 113 (*)    Calcium 8.8 (*)    All other components within normal limits  CBC - Abnormal; Notable for the following components:   Hemoglobin 11.8 (*)    RDW 16.9 (*)    All other components within normal limits  TROPONIN I (HIGH SENSITIVITY)   ____________________________________________  EKG  ED ECG REPORT I, Jene Every, the attending physician, personally viewed and interpreted this ECG.  Date: 03/03/2020  Rhythm: normal sinus rhythm QRS Axis: normal Intervals: normal ST/T Wave abnormalities: normal Narrative Interpretation: no evidence of acute ischemia  ____________________________________________  RADIOLOGY  Chest x-ray  reviewed by me, no acute abnormality ____________________________________________   PROCEDURES  Procedure(s) performed: No  .1-3 Lead EKG Interpretation Performed by: Jene Every, MD Authorized by: Jene Every, MD     Interpretation: normal     ECG rate assessment: normal     Rhythm: sinus rhythm     Ectopy: none     Conduction: normal       Critical Care performed: No ____________________________________________   INITIAL IMPRESSION / ASSESSMENT AND PLAN / ED COURSE  Pertinent labs & imaging results that were available during my care of the patient were reviewed by me and considered in my medical decision making (see chart for details).  Patient presents with chest pain as described above.  Is currently undergoing cardiac work-up with cardiology.  EKG is overall reassuring.  Doubt ACS however will send single set high-sensitivity troponin.  Chest x-ray reassuring, symptoms not consistent with PE given no pleurisy cough shortness of breath, had negative D-dimer approximately 1 week ago and today's pain is the same.   Troponin is normal, chest x-ray is normal.  Sent for CTA which is unremarkable  Discussed with Dr. Cristal Deer of cardiology, agrees with outpatient follow-up      ____________________________________________   FINAL CLINICAL IMPRESSION(S) / ED DIAGNOSES  Final diagnoses:  Atypical chest pain        Note:  This document was prepared using Dragon voice recognition software and may include unintentional dictation errors.   Jene Every, MD 03/03/20 [phone]

## 2020-03-10 ENCOUNTER — Telehealth: Payer: Self-pay | Admitting: Medical

## 2020-03-10 NOTE — Telephone Encounter (Signed)
   Patient called the after hours line reporting ongoing chest discomfort. She continues to have intermittent atypical chest discomfort which is non-exertional in nature and not improved with trial of SL nitro, aspirin, or ibuprofen. Additionally she has been taking a PPI for the past 2 weeks without noticeable change. She is currently wearing a cardiac monitor to evaluate tachypalpitations and reports having episodes while wearing the monitor so hopefully that will be revealing. She is scheduled for an echocardiogram and stress test but these will not occur for another 1.5-2 weeks. I discussed ongoing watchful waiting given recent reassuring work-up in the ED 03/03/20 with no significant change in symptoms vs presenting back to the ED. She seems very anxious about the chest pain. If she does return to the ED, may be worth-while expediting her work-up for peace of mind. Will route to Dr. Azucena Cecil as an Lorain Childes.   Beatriz Stallion, PA-C 03/10/20; 10:16 AM

## 2020-03-11 ENCOUNTER — Other Ambulatory Visit: Payer: Self-pay

## 2020-03-11 ENCOUNTER — Emergency Department: Payer: Medicaid Other

## 2020-03-11 ENCOUNTER — Inpatient Hospital Stay
Admission: EM | Admit: 2020-03-11 | Discharge: 2020-03-18 | DRG: 286 | Disposition: A | Discharge status: Home Health | Payer: Medicaid Other | Attending: Family Medicine | Admitting: Family Medicine

## 2020-03-11 DIAGNOSIS — I110 Hypertensive heart disease with heart failure: Principal | ICD-10-CM | POA: Diagnosis present

## 2020-03-11 DIAGNOSIS — Z880 Allergy status to penicillin: Secondary | ICD-10-CM | POA: Diagnosis not present

## 2020-03-11 DIAGNOSIS — R0789 Other chest pain: Principal | ICD-10-CM | POA: Diagnosis present

## 2020-03-11 DIAGNOSIS — Z886 Allergy status to analgesic agent status: Secondary | ICD-10-CM

## 2020-03-11 DIAGNOSIS — Z8249 Family history of ischemic heart disease and other diseases of the circulatory system: Secondary | ICD-10-CM | POA: Diagnosis not present

## 2020-03-11 DIAGNOSIS — Z885 Allergy status to narcotic agent status: Secondary | ICD-10-CM | POA: Diagnosis not present

## 2020-03-11 DIAGNOSIS — Z79890 Hormone replacement therapy (postmenopausal): Secondary | ICD-10-CM | POA: Diagnosis not present

## 2020-03-11 DIAGNOSIS — E119 Type 2 diabetes mellitus without complications: Secondary | ICD-10-CM | POA: Diagnosis present

## 2020-03-11 DIAGNOSIS — M797 Fibromyalgia: Secondary | ICD-10-CM | POA: Diagnosis present

## 2020-03-11 DIAGNOSIS — E039 Hypothyroidism, unspecified: Secondary | ICD-10-CM | POA: Diagnosis present

## 2020-03-11 DIAGNOSIS — Z86718 Personal history of other venous thrombosis and embolism: Secondary | ICD-10-CM

## 2020-03-11 DIAGNOSIS — R0902 Hypoxemia: Secondary | ICD-10-CM

## 2020-03-11 DIAGNOSIS — R079 Chest pain, unspecified: Secondary | ICD-10-CM

## 2020-03-11 DIAGNOSIS — Z20822 Contact with and (suspected) exposure to covid-19: Secondary | ICD-10-CM | POA: Diagnosis present

## 2020-03-11 DIAGNOSIS — Z882 Allergy status to sulfonamides status: Secondary | ICD-10-CM | POA: Diagnosis not present

## 2020-03-11 DIAGNOSIS — E7800 Pure hypercholesterolemia, unspecified: Secondary | ICD-10-CM | POA: Diagnosis not present

## 2020-03-11 DIAGNOSIS — F418 Other specified anxiety disorders: Secondary | ICD-10-CM | POA: Diagnosis present

## 2020-03-11 DIAGNOSIS — Z6841 Body Mass Index (BMI) 40.0 and over, adult: Secondary | ICD-10-CM

## 2020-03-11 DIAGNOSIS — Z79899 Other long term (current) drug therapy: Secondary | ICD-10-CM

## 2020-03-11 DIAGNOSIS — J9811 Atelectasis: Secondary | ICD-10-CM | POA: Diagnosis present

## 2020-03-11 DIAGNOSIS — J9601 Acute respiratory failure with hypoxia: Secondary | ICD-10-CM | POA: Diagnosis present

## 2020-03-11 DIAGNOSIS — I5031 Acute diastolic (congestive) heart failure: Secondary | ICD-10-CM | POA: Diagnosis not present

## 2020-03-11 DIAGNOSIS — R600 Localized edema: Secondary | ICD-10-CM | POA: Diagnosis present

## 2020-03-11 DIAGNOSIS — I10 Essential (primary) hypertension: Secondary | ICD-10-CM | POA: Diagnosis not present

## 2020-03-11 DIAGNOSIS — J4520 Mild intermittent asthma, uncomplicated: Secondary | ICD-10-CM

## 2020-03-11 DIAGNOSIS — I200 Unstable angina: Secondary | ICD-10-CM

## 2020-03-11 DIAGNOSIS — Z7984 Long term (current) use of oral hypoglycemic drugs: Secondary | ICD-10-CM | POA: Diagnosis not present

## 2020-03-11 DIAGNOSIS — F250 Schizoaffective disorder, bipolar type: Secondary | ICD-10-CM | POA: Diagnosis present

## 2020-03-11 DIAGNOSIS — K219 Gastro-esophageal reflux disease without esophagitis: Secondary | ICD-10-CM | POA: Diagnosis present

## 2020-03-11 DIAGNOSIS — Z90710 Acquired absence of both cervix and uterus: Secondary | ICD-10-CM

## 2020-03-11 DIAGNOSIS — E873 Alkalosis: Secondary | ICD-10-CM | POA: Diagnosis not present

## 2020-03-11 DIAGNOSIS — E785 Hyperlipidemia, unspecified: Secondary | ICD-10-CM | POA: Diagnosis present

## 2020-03-11 DIAGNOSIS — D649 Anemia, unspecified: Secondary | ICD-10-CM | POA: Diagnosis not present

## 2020-03-11 DIAGNOSIS — E662 Morbid (severe) obesity with alveolar hypoventilation: Secondary | ICD-10-CM | POA: Diagnosis present

## 2020-03-11 DIAGNOSIS — R072 Precordial pain: Secondary | ICD-10-CM | POA: Diagnosis not present

## 2020-03-11 DIAGNOSIS — R0600 Dyspnea, unspecified: Secondary | ICD-10-CM

## 2020-03-11 DIAGNOSIS — R0609 Other forms of dyspnea: Secondary | ICD-10-CM | POA: Insufficient documentation

## 2020-03-11 DIAGNOSIS — F319 Bipolar disorder, unspecified: Secondary | ICD-10-CM | POA: Diagnosis not present

## 2020-03-11 DIAGNOSIS — I5033 Acute on chronic diastolic (congestive) heart failure: Secondary | ICD-10-CM | POA: Diagnosis present

## 2020-03-11 DIAGNOSIS — J45909 Unspecified asthma, uncomplicated: Secondary | ICD-10-CM | POA: Diagnosis present

## 2020-03-11 DIAGNOSIS — Z9049 Acquired absence of other specified parts of digestive tract: Secondary | ICD-10-CM

## 2020-03-11 LAB — CBC
HCT: 36.9 % (ref 36.0–46.0)
Hemoglobin: 10.9 g/dL — ABNORMAL LOW (ref 12.0–15.0)
MCH: 27.8 pg (ref 26.0–34.0)
MCHC: 29.5 g/dL — ABNORMAL LOW (ref 30.0–36.0)
MCV: 94.1 fL (ref 80.0–100.0)
Platelets: 307 10*3/uL (ref 150–400)
RBC: 3.92 MIL/uL (ref 3.87–5.11)
RDW: 17.2 % — ABNORMAL HIGH (ref 11.5–15.5)
WBC: 9.6 10*3/uL (ref 4.0–10.5)
nRBC: 0.5 % — ABNORMAL HIGH (ref 0.0–0.2)

## 2020-03-11 LAB — URINE DRUG SCREEN, QUALITATIVE (ARMC ONLY)
Amphetamines, Ur Screen: NOT DETECTED
Barbiturates, Ur Screen: NOT DETECTED
Benzodiazepine, Ur Scrn: NOT DETECTED
Cannabinoid 50 Ng, Ur ~~LOC~~: NOT DETECTED
Cocaine Metabolite,Ur ~~LOC~~: NOT DETECTED
MDMA (Ecstasy)Ur Screen: NOT DETECTED
Methadone Scn, Ur: NOT DETECTED
Opiate, Ur Screen: NOT DETECTED
Phencyclidine (PCP) Ur S: NOT DETECTED
Tricyclic, Ur Screen: POSITIVE — AB

## 2020-03-11 LAB — CBG MONITORING, ED
Glucose-Capillary: 108 mg/dL — ABNORMAL HIGH (ref 70–99)
Glucose-Capillary: 114 mg/dL — ABNORMAL HIGH (ref 70–99)
Glucose-Capillary: 117 mg/dL — ABNORMAL HIGH (ref 70–99)

## 2020-03-11 LAB — TROPONIN I (HIGH SENSITIVITY)
Troponin I (High Sensitivity): 3 ng/L (ref <18)
Troponin I (High Sensitivity): 3 ng/L (ref <18)
Troponin I (High Sensitivity): 4 ng/L (ref <18)

## 2020-03-11 LAB — RESP PANEL BY RT-PCR (FLU A&B, COVID) ARPGX2
Influenza A by PCR: NEGATIVE
Influenza B by PCR: NEGATIVE
SARS Coronavirus 2 by RT PCR: NEGATIVE

## 2020-03-11 LAB — BASIC METABOLIC PANEL
Anion gap: 11 (ref 5–15)
BUN: 22 mg/dL — ABNORMAL HIGH (ref 6–20)
CO2: 27 mmol/L (ref 22–32)
Calcium: 9 mg/dL (ref 8.9–10.3)
Chloride: 100 mmol/L (ref 98–111)
Creatinine, Ser: 0.66 mg/dL (ref 0.44–1.00)
GFR, Estimated: >60 mL/min (ref >60)
Glucose, Bld: 128 mg/dL — ABNORMAL HIGH (ref 70–99)
Potassium: 4.9 mmol/L (ref 3.5–5.1)
Sodium: 138 mmol/L (ref 135–145)

## 2020-03-11 LAB — BRAIN NATRIURETIC PEPTIDE: B Natriuretic Peptide: 30.7 pg/mL (ref 0.0–100.0)

## 2020-03-11 LAB — TSH: TSH: 28.292 u[IU]/mL — ABNORMAL HIGH (ref 0.350–4.500)

## 2020-03-11 LAB — FIBRIN DERIVATIVES D-DIMER (ARMC ONLY): Fibrin derivatives D-dimer (ARMC): 417.06 ng/mL (FEU) (ref 0.00–499.00)

## 2020-03-11 MED ORDER — INSULIN ASPART 100 UNIT/ML ~~LOC~~ SOLN
0.0000 [IU] | Freq: Three times a day (TID) | SUBCUTANEOUS | Status: DC
  Administered 2020-03-15: 2 [IU] via SUBCUTANEOUS
  Administered 2020-03-15 – 2020-03-16 (×2): 1 [IU] via SUBCUTANEOUS
  Administered 2020-03-17 – 2020-03-18 (×3): 2 [IU] via SUBCUTANEOUS
  Filled 2020-03-11 (×6): qty 1

## 2020-03-11 MED ORDER — LEVOTHYROXINE SODIUM 100 MCG/5ML IV SOLN
150.0000 ug | Freq: Once | INTRAVENOUS | Status: DC
  Filled 2020-03-11: qty 10

## 2020-03-11 MED ORDER — LEVOTHYROXINE SODIUM 50 MCG PO TABS: 250.0000 ug | ORAL_TABLET | Freq: Every day | ORAL | Status: DC

## 2020-03-11 MED ORDER — IBUPROFEN 400 MG PO TABS
800.0000 mg | ORAL_TABLET | Freq: Two times a day (BID) | ORAL | Status: DC | PRN
  Administered 2020-03-11: 21:00:00 800 mg via ORAL
  Filled 2020-03-11: qty 1

## 2020-03-11 MED ORDER — LEVOTHYROXINE SODIUM 200 MCG PO TABS: 200.0000 ug | ORAL_TABLET | Freq: Every day | ORAL | Status: DC

## 2020-03-11 MED ORDER — CLONAZEPAM 0.5 MG PO TABS
0.5000 mg | ORAL_TABLET | Freq: Three times a day (TID) | ORAL | Status: DC
  Administered 2020-03-11 – 2020-03-18 (×20): 0.5 mg via ORAL
  Filled 2020-03-11 (×21): qty 1

## 2020-03-11 MED ORDER — FUROSEMIDE 10 MG/ML IJ SOLN
60.0000 mg | Freq: Once | INTRAMUSCULAR | Status: AC
  Administered 2020-03-11: 60 mg via INTRAVENOUS
  Filled 2020-03-11: qty 8

## 2020-03-11 MED ORDER — LOSARTAN POTASSIUM 50 MG PO TABS
100.0000 mg | ORAL_TABLET | Freq: Every day | ORAL | Status: DC
  Administered 2020-03-11 – 2020-03-15 (×4): 100 mg via ORAL
  Filled 2020-03-11 (×4): qty 2

## 2020-03-11 MED ORDER — MORPHINE SULFATE (PF) 2 MG/ML IV SOLN: 2.0000 mg | INTRAVENOUS | Status: DC | PRN

## 2020-03-11 MED ORDER — MORPHINE SULFATE (PF) 2 MG/ML IV SOLN
INTRAVENOUS | Status: AC
  Administered 2020-03-11: 2 mg via INTRAVENOUS
  Filled 2020-03-11: qty 1

## 2020-03-11 MED ORDER — TRAMADOL HCL 50 MG PO TABS
50.0000 mg | ORAL_TABLET | Freq: Four times a day (QID) | ORAL | Status: DC | PRN
  Administered 2020-03-11 – 2020-03-18 (×20): 50 mg via ORAL
  Filled 2020-03-11 (×20): qty 1

## 2020-03-11 MED ORDER — ZOLPIDEM TARTRATE 5 MG PO TABS
5.0000 mg | ORAL_TABLET | Freq: Every evening | ORAL | Status: DC | PRN
  Administered 2020-03-11 – 2020-03-18 (×5): 5 mg via ORAL
  Filled 2020-03-11 (×5): qty 1

## 2020-03-11 MED ORDER — TORSEMIDE 20 MG PO TABS
20.0000 mg | ORAL_TABLET | Freq: Every day | ORAL | Status: DC
  Administered 2020-03-12: 20 mg via ORAL
  Filled 2020-03-11 (×2): qty 1

## 2020-03-11 MED ORDER — ALBUTEROL SULFATE HFA 108 (90 BASE) MCG/ACT IN AERS
2.0000 | INHALATION_SPRAY | RESPIRATORY_TRACT | Status: DC | PRN
  Filled 2020-03-11: qty 6.7

## 2020-03-11 MED ORDER — NITROGLYCERIN 0.4 MG SL SUBL: 0.4000 mg | SUBLINGUAL_TABLET | SUBLINGUAL | Status: DC | PRN

## 2020-03-11 MED ORDER — ATORVASTATIN CALCIUM 80 MG PO TABS
80.0000 mg | ORAL_TABLET | Freq: Every day | ORAL | Status: DC
  Administered 2020-03-11 – 2020-03-17 (×7): 80 mg via ORAL
  Filled 2020-03-11 (×3): qty 1
  Filled 2020-03-11: qty 4
  Filled 2020-03-11 (×3): qty 1

## 2020-03-11 MED ORDER — PANTOPRAZOLE SODIUM 40 MG PO TBEC
40.0000 mg | DELAYED_RELEASE_TABLET | Freq: Every day | ORAL | Status: DC
  Administered 2020-03-11 – 2020-03-18 (×7): 40 mg via ORAL
  Filled 2020-03-11 (×7): qty 1

## 2020-03-11 MED ORDER — LEVOTHYROXINE SODIUM 25 MCG PO TABS
225.0000 ug | ORAL_TABLET | Freq: Every day | ORAL | Status: DC
  Administered 2020-03-12 – 2020-03-18 (×7): 225 ug via ORAL
  Filled 2020-03-11 (×8): qty 1

## 2020-03-11 MED ORDER — HYDRALAZINE HCL 20 MG/ML IJ SOLN: 5.0000 mg | INTRAMUSCULAR | Status: DC | PRN

## 2020-03-11 MED ORDER — PROPRANOLOL HCL 20 MG PO TABS
20.0000 mg | ORAL_TABLET | Freq: Three times a day (TID) | ORAL | Status: DC
  Administered 2020-03-11 – 2020-03-18 (×18): 20 mg via ORAL
  Filled 2020-03-11 (×23): qty 1

## 2020-03-11 MED ORDER — QUETIAPINE FUMARATE 300 MG PO TABS
600.0000 mg | ORAL_TABLET | Freq: Every day | ORAL | Status: DC
  Administered 2020-03-11 – 2020-03-17 (×7): 600 mg via ORAL
  Filled 2020-03-11 (×8): qty 2

## 2020-03-11 MED ORDER — ACETAMINOPHEN 325 MG PO TABS
650.0000 mg | ORAL_TABLET | Freq: Four times a day (QID) | ORAL | Status: DC | PRN
  Administered 2020-03-11 – 2020-03-18 (×5): 650 mg via ORAL
  Filled 2020-03-11 (×5): qty 2

## 2020-03-11 MED ORDER — ENOXAPARIN SODIUM 80 MG/0.8ML ~~LOC~~ SOLN
0.5000 mg/kg | SUBCUTANEOUS | Status: DC
  Administered 2020-03-12 – 2020-03-17 (×6): 72.5 mg via SUBCUTANEOUS
  Filled 2020-03-11 (×7): qty 0.8

## 2020-03-11 MED ORDER — INSULIN ASPART 100 UNIT/ML ~~LOC~~ SOLN: 0.0000 [IU] | Freq: Every day | SUBCUTANEOUS | Status: DC

## 2020-03-11 MED ORDER — ONDANSETRON HCL 4 MG/2ML IJ SOLN
4.0000 mg | Freq: Three times a day (TID) | INTRAMUSCULAR | Status: DC | PRN
  Administered 2020-03-12 – 2020-03-16 (×6): 4 mg via INTRAVENOUS
  Filled 2020-03-11 (×6): qty 2

## 2020-03-11 MED ORDER — KETOROLAC TROMETHAMINE 30 MG/ML IJ SOLN
15.0000 mg | Freq: Once | INTRAMUSCULAR | Status: AC
  Administered 2020-03-11: 15 mg via INTRAVENOUS
  Filled 2020-03-11: qty 1

## 2020-03-11 MED ORDER — DM-GUAIFENESIN ER 30-600 MG PO TB12: 1.0000 | ORAL_TABLET | Freq: Two times a day (BID) | ORAL | Status: DC | PRN

## 2020-03-11 NOTE — Consult Note (Addendum)
Cardiology Consultation:   Patient ID: Rhonda Palmer; [id number]; [birth year]   Admit date: 03/11/2020 Date of Consult: 03/11/2020  Primary Care Provider: Grayland JackWilliams, Christine G, FNP Primary Cardiologist: Agbor-Etang Primary Electrophysiologist:  None   Patient Profile:   Rhonda Palmer is a 50 y.o. female with a hx of schizoaffective disorder, bipolar disorder, fibromyalgia, anxiety, DM2, HTN, HLD, anemia, asthma, hypothyroidism, history of DVT in 2004 following pregnancy, lymphedema, prior benzodiazepine and opiate abuse, and obesity who is being seen today for the evaluation of chest pain at the request of Dr. Clyde LundborgNiu.  History of Present Illness:   Rhonda Palmer was evaluated by our office on 03/01/2020 for ED follow-up of chest pain, shortness of breath, and palpitations.  Review of Epic shows numerous ED visits for varying complaints.  She has been seen twice in the ED this year for chest pain felt to be atypical with nonischemic EKG and negative high-sensitivity troponins.  Following her initial visit she was evaluated by our office earlier this month for chest pain and prescribed sublingual nitroglycerin.  Zio patch was placed and she was scheduled for Lexiscan MPI and echo, both of which are scheduled for later this month.  She was recently seen in the ED after her cardiology visit for chest pain that was again felt to be atypical with negative cardiac work-up.  CTA of the chest was limited secondary to body habitus and respiratory motion as well as a suboptimal contrast timing though no obvious PE was identified.  She contacted our on-call service on 2/12 with continued intermittent atypical chest discomfort that was nonexertional and not improved with SL NTG, aspirin, or ibuprofen.  There was noted she appeared quite anxious at that time.  She returned to the ED on 03/11/2020 with 3 to 4 hours of chest pain described as bilateral that was similar to her prior episodes.  Pain did not radiate  and was described as being sharp and worse when laying flat.  She was noted to be hypoxic with O2 saturations of 88% on room air upon arrival which improved with supplemental oxygen via nasal cannula.  She was mildly hypertensive.  Afebrile.  Not vaccinated for Covid.  EKG shows sinus rhythm with no acute ST-T changes.  Chest x-ray showed postoperative changes on the right side with no acute cardiopulmonary process.  High-sensitivity troponin negative x2, BNP normal, Covid PCR negative, D-dimer pending, and urine drug screen pending.  In the ED she was given IV Lasix 60 mg, Toradol, and morphine.  Currently, notes improvement in symptoms following supplemental oxygen, Toradol, and morphine.   Past Medical History:  Diagnosis Date  . Anxiety   . Asthma   . Bipolar 1 disorder (HCC)   . Diabetes mellitus without complication (HCC)   . Edema leg for twenty years per patient   taking lasix for edema  . Fibromyalgia   . Hypertension   . Ovarian cyst   . Schizo affective schizophrenia (HCC)   . Thyroid disease     Past Surgical History:  Procedure Laterality Date  . ABDOMINAL HYSTERECTOMY    . ANKLE SURGERY     pt. states long time ago     Home Meds: Prior to Admission medications   Medication Sig Start Date End Date Taking? Authorizing Provider  albuterol (PROVENTIL HFA;VENTOLIN HFA) 108 (90 Base) MCG/ACT inhaler Inhale 2 puffs into the lungs every 6 (six) hours as needed for wheezing or shortness of breath.    [provider]  ARIPiprazole ER (ABILIFY MAINTENA) 300 MG SRER injection Inject 300 mg into the muscle every 28 (twenty-eight) days.    [provider]  ARISTADA INITIO 675 MG/2.4ML prefilled syringe Inject 2.4 ml intramuscularly single dose 02/27/20   [provider]  atorvastatin (LIPITOR) 80 MG tablet Take 80 mg by mouth at bedtime. 02/27/20   [provider]  clonazePAM (KLONOPIN) 0.5 MG tablet Take 0.5 mg by mouth 3 (three) times daily.   02/18/18   [provider]  glimepiride (AMARYL) 4 MG tablet Take 4 mg by mouth daily. 02/27/20   [provider]  IBU 800 MG tablet Take 800 mg by mouth 2 (two) times daily as needed. 12/30/19   [provider]  Levothyroxine Sodium 200 MCG CAPS Take 200 mcg by mouth daily. 02/02/20   [provider]  losartan (COZAAR) 100 MG tablet Take 100 mg by mouth daily. 02/27/20   [provider]  metFORMIN (GLUCOPHAGE) 500 MG tablet Take 500 mg by mouth 2 (two) times daily with a meal. 02/27/20   [provider]  nitroGLYCERIN (NITROSTAT) 0.4 MG SL tablet Place 1 tablet (0.4 mg total) under the tongue every 5 (five) minutes as needed for chest pain. 03/02/20 05/31/20  Debbe Odea, MD  ondansetron (ZOFRAN) 4 MG tablet Take 4 mg by mouth every 8 (eight) hours as needed. 02/27/20   [provider]  pantoprazole (PROTONIX) 40 MG tablet Take 40 mg by mouth daily. 02/27/20   [provider]  propranolol (INDERAL) 20 MG tablet Take 20 mg by mouth 3 (three) times daily. 02/27/20   [provider]  QUEtiapine (SEROQUEL) 300 MG tablet Take 600 mg by mouth at bedtime. 02/16/20   [provider]  torsemide (DEMADEX) 20 MG tablet Take 20 mg by mouth daily. 01/23/20   [provider]  ZOLOFT 50 MG tablet Take 50 mg by mouth daily. 02/27/20   [provider]  zolpidem (AMBIEN) 5 MG tablet Take 5 mg by mouth at bedtime as needed. 03/05/20   [provider]    Inpatient Medications: Scheduled Meds: . furosemide  60 mg Intravenous Once  . insulin aspart  0-5 Units Subcutaneous QHS  . insulin aspart  0-9 Units Subcutaneous TID WC   Continuous Infusions:  PRN Meds: acetaminophen, albuterol, dextromethorphan-guaiFENesin, hydrALAZINE, morphine injection, nitroGLYCERIN, ondansetron (ZOFRAN) IV  Allergies:   Allergies  Allergen Reactions  . Codeine Hives, Itching, Rash and Swelling  . Sulfa Antibiotics  Anaphylaxis  . Penicillins Itching  . Aspirin Rash    Social History:   Social History   Socioeconomic History  . Marital status: Married    Spouse name: Not on file  . Number of children: Not on file  . Years of education: Not on file  . Highest education level: Not on file  Occupational History  . Not on file  Tobacco Use  . Smoking status: Never Smoker  . Smokeless tobacco: Never Used  Vaping Use  . Vaping Use: Never used  Substance and Sexual Activity  . Alcohol use: No  . Drug use: No  . Sexual activity: Not on file  Other Topics Concern  . Not on file  Social History Narrative  . Not on file   Social Determinants of Health   Financial Resource Strain: Not on file  Food Insecurity: Not on file  Transportation Needs: Not on file  Physical Activity: Not on file  Stress: Not on file  Social Connections: Not on file  Intimate  Partner Violence: Not on file     Family History:   Family History  Problem Relation Age of Onset  . Heart failure Mother   . Bladder Cancer Neg Hx   . Kidney cancer Neg Hx     ROS:  Review of Systems  Constitutional: Positive for malaise/fatigue. Negative for chills, diaphoresis, fever and weight loss.  HENT: Negative for congestion.   Eyes: Negative for discharge and redness.  Respiratory: Positive for shortness of breath. Negative for cough, sputum production and wheezing.   Cardiovascular: Positive for chest pain and leg swelling. Negative for palpitations, orthopnea, claudication and PND.  Gastrointestinal: Negative for abdominal pain, heartburn, nausea and vomiting.  Musculoskeletal: Negative for falls and myalgias.  Skin: Negative for rash.  Neurological: Positive for weakness and headaches. Negative for dizziness, tingling, tremors, sensory change, speech change, focal weakness and loss of consciousness.  Endo/Heme/Allergies: Does not bruise/bleed easily.  Psychiatric/Behavioral: Negative for substance abuse. The patient is  not nervous/anxious.   All other systems reviewed and are negative.     Physical Exam/Data:   Vitals:   03/11/20 0800 03/11/20 0830 03/11/20 0900 03/11/20 0930  BP: (!) 162/86 (!) 155/90 (!) 150/87 (!) 146/88  Pulse: 89 88 88 87  Resp: 19 (!) 21 (!) 22 20  Temp:      TempSrc:      SpO2: 99% 99% 99% 99%  Weight:      Height:       No intake or output data in the 24 hours ending 03/11/20 1125 Filed Weights   03/11/20 0749  Weight: (!) 145.2 kg   Body mass index is 60.46 kg/m.   Physical Exam: General: Well developed, well nourished, in no acute distress. Head: Normocephalic, atraumatic, sclera non-icteric, no xanthomas, nares without discharge.  Neck: Negative for carotid bruits. JVD difficult to assess secondary to body. Lungs: Clear bilaterally to auscultation without wheezes, rales, or rhonchi. Breathing is unlabored. Heart: RRR with S1 S2. No murmurs, rubs, or gallops appreciated.  Palpation does not reproduce chest pain. Abdomen: Soft, non-tender, non-distended with normoactive bowel sounds. No hepatomegaly. No rebound/guarding. No obvious abdominal masses. Msk:  Strength and tone appear normal for age. Extremities: No clubbing or cyanosis.  Bilateral lymphedema noted with hyperpigmentation. Distal pedal pulses are 2+ and equal bilaterally. Neuro: Alert and oriented X 3. No facial asymmetry. No focal deficit. Moves all extremities spontaneously. Psych:  Responds to questions appropriately with a normal affect.   EKG:  The EKG was personally reviewed and demonstrates: NSR, 94 bpm, baseline wandering, LVH, no acute ST-T changes Telemetry:  Telemetry was personally reviewed and demonstrates: SR  Weights: [employer]   03/11/20 0749  Weight: (!) 145.2 kg    Relevant CV Studies:  Zio patch/echo/Lexiscan MPI pending  Laboratory Data:  Chemistry Recent Labs  Lab 03/11/20 0803  NA 138  K 4.9  CL 100  CO2 27  GLUCOSE 128*  BUN 22*  CREATININE 0.66  CALCIUM  9.0  GFRNONAA >60  ANIONGAP 11    No results for input(s): PROT, ALBUMIN, AST, ALT, ALKPHOS, BILITOT in the last 168 hours. Hematology Recent Labs  Lab 03/11/20 0803  WBC 9.6  RBC 3.92  HGB 10.9*  HCT 36.9  MCV 94.1  MCH 27.8  MCHC 29.5*  RDW 17.2*  PLT 307   Cardiac EnzymesNo results for input(s): TROPONINI in the last 168 hours. No results for input(s): TROPIPOC in the last 168 hours.  BNP Recent Labs  Lab 03/11/20 [phone]  BNP 30.7    DDimer No results for input(s): DDIMER in the last 168 hours.  Radiology/Studies:  DG Chest 2 View  Result Date: 03/11/2020 IMPRESSION: Postoperative change on the right. No edema or airspace opacity. Stable cardiac silhouette. Electronically Signed   By: Bretta Bang III M.D.   On: 03/11/2020 08:28    Assessment and Plan:   1.  Chest pain with moderate risk for cardiac etiology: -Overall it is reassuring that the patient has had episodes of chest pain lasting hours in duration with a total of 5 negative high-sensitivity troponins dating back to late January 2022, making a cardiac etiology much less likely -Pain does appear to be worse when laying flat and improved with supine, check echo to evaluate for pericardial effusion.  Of note, EKG does not have characteristic pericarditis ST-T changes -That said, she does have significant CAD risk factors including hypertension, hyperlipidemia, diabetes, and obesity -Echo has been ordered and is pending if this is reassuring suspect she can just follow-up with her previously scheduled outpatient Lexiscan MPI -Further recommendations pending echo  2.  Dyspnea with acute hypoxic respiratory distress: -No evidence of ACS or indication for heparin drip at this time -Volume status is difficult to assess secondary to body habitus though chest x-ray without significant vascular congestion or pulmonary edema and BNP found to be normal, though this can be falsely low in obesity -Cannot exclude some  degree of diastolic dysfunction given elevated BP, LVH noted on EKG, and body habitus -60 mg of IV Lasix ordered by primary service at time of admission, will assess urine output response -Prior CTA chest was without obvious evidence of PE though this study was limited as noted in the radiology note -Primary service could consider VQ scan though this may also be somewhat limited given her body habitus -D-dimer and echo pending -She is noted to have some degree of anemia though not to the point where she would have vital sign abnormalities -O2 saturations have improved nicely with supplemental oxygen via nasal cannula, would not be surprised if there is some degree of obesity hypoventilation syndrome and sleep apnea which should be evaluated in the outpatient setting  3.  Palpitations: -No significant arrhythmia noted on telemetry in the ED -Recent TSH noted to be significantly elevated -Currently wearing Zio patch which will be continued -Follow-up with primary cardiologist as an outpatient  4.  HTN: -Blood pressure is mildly elevated in the ED though stress/anxiety may be contributing to this -Continue PTA medications with titration as needed  5.  Psychiatric history: -Possibly contributing to her overall presentation -Management per internal medicine  6.  Obesity: -Weight loss advised  7.  Anemia: -Appears to be stable -Needs work-up, likely as outpatient  8.  Abnormal TSH: -TSH in late January 2022 elevated at 31.779 -Management per IM    For questions or updates, please contact [facility] Please consult [email or web address] for contact info under Cardiology/STEMI.   Signed, Eula Listen, PA-C [facility] Pager: [phone] 03/11/2020, 11:25 AM  Patient examined chart reviewed Discussed care with patient and PA Exam with morbidly obese female basilar atelectasis distant heart sounds soft abdomen Plus 1-2 LE edema chronic with previus Left TKR. ECG unremarkable troponins  fine despite prolonged SSCP. Low likelihood of acute cardiac problem Echo ordered She has outpatient myovue already scheduled Ok to d/c from cardiac perspective and f/u as outpatient Primary service to assess hypoxia likely from obesity hypoventilation She has had palpitations and is wearing her Zio  patch No arrhythmias on monitor in ER   Charlton Haws MD [facility]

## 2020-03-11 NOTE — ED Notes (Signed)
Pt reports "the only thing that knocked out the pain was the morphine"

## 2020-03-11 NOTE — H&P (Addendum)
History and Physical    Rhonda Palmer ZOX:[id number] DOB: 1971/01/18 DOA: 03/11/2020  Referring MD/NP/PA:   PCP: Grayland Jack, FNP   Patient coming from:  The patient is coming from home.  At baseline, pt is independent for most of ADL.        Chief Complaint: chest pain  HPI: Rhonda Palmer is a 50 y.o. female with medical history significant of morbid obesity, BMI 60, HTN, HLD, DM, asthma, hypothyroidism, schizophrenia, bipolar, leg edema, history of benzo and opiate abuse in remission, DVT not on AC.  Presents with intermittent CP.  Patient states that she has been having intermittent chest pain for almost 2 weeks, which has worsened in the past 2 days. It is located in substernal area, pressure and sharp pain, 7-8 out of 10 severity, nonradiating. It is not pleuritic. Patient denies cough, fever or chills. It is associated with palpitation and shortness of breath. No tenderness in calf areas. Patient has nausea, no vomiting, diarrhea or abdominal pain. No symptoms of UTI or unilateral weakness. Review of Epic shows numerous ED visits for varying complaints.   Patient had CT angiogram of chest on 03/10/2020 whichwas limited secondary to body habitus and respiratory motion as well as a suboptimal contrast timing though no obvious PE was identified. Patient was seen by Dr. Azucena Cecil of cardiology on 03/01/2020 in clinic. She was scheduled for Lexiscan MPI and echo, both of which are scheduled for later this month.    ED Course: pt was found to have trop 3 -->3, pending D-dimer, WBC 9.6, negative Covid PCR, BNP 30.7, electrolytes renal function okay, temperature normal, blood pressure 146/88, heart rate 96, RR 22, oxygen saturation 88% on room air, which improved to 99% on 2 L oxygen. Chest x-ray is negative for acute issues. Patient is admitted to progressive bed as inpatient. Cardiology, Dr. Eden Emms is consulted.   Review of Systems:   General: no fevers, chills, no body weight  gain, has fatigue HEENT: no blurry vision, hearing changes or sore throat Respiratory: has dyspnea, no coughing, wheezing CV: has chest pain,  palpitations GI: has nausea, no vomiting, abdominal pain, diarrhea, constipation GU: no dysuria, burning on urination, increased urinary frequency, hematuria  Ext: has leg edema Neuro: no unilateral weakness, numbness, or tingling, no vision change or hearing loss Skin: no rash, no skin tear. MSK: No muscle spasm, no deformity, no limitation of range of movement in spin Heme: No easy bruising.  Travel history: No recent long distant travel.  Allergy:  Allergies  Allergen Reactions  . Codeine Hives, Itching, Rash and Swelling  . Sulfa Antibiotics Anaphylaxis  . Penicillins Itching    Past Medical History:  Diagnosis Date  . Anxiety   . Asthma   . Bipolar 1 disorder (HCC)   . Diabetes mellitus without complication (HCC)   . Edema leg for twenty years per patient   taking lasix for edema  . Fibromyalgia   . Hypertension   . Ovarian cyst   . Schizo affective schizophrenia (HCC)   . Thyroid disease     Past Surgical History:  Procedure Laterality Date  . ABDOMINAL HYSTERECTOMY    . ANKLE SURGERY     pt. states long time ago    Social History:  reports that she has never smoked. She has never used smokeless tobacco. She reports that she does not drink alcohol and does not use drugs.  Family History:  Family History  Problem Relation Age of Onset  .  Heart failure Mother   . Bladder Cancer Neg Hx   . Kidney cancer Neg Hx      Prior to Admission medications   Medication Sig Start Date End Date Taking? Authorizing Provider  albuterol (PROVENTIL HFA;VENTOLIN HFA) 108 (90 Base) MCG/ACT inhaler Inhale 2 puffs into the lungs every 6 (six) hours as needed for wheezing or shortness of breath.    [provider]  ARIPiprazole ER (ABILIFY MAINTENA) 300 MG SRER injection Inject 300 mg into the muscle every 28 (twenty-eight) days.     [provider]  ARISTADA INITIO 675 MG/2.4ML prefilled syringe Inject 2.4 ml intramuscularly single dose 02/27/20   [provider]  atorvastatin (LIPITOR) 80 MG tablet TAKE ONE TABLET BY MOUTH QHS.**THIS REPLACES SIMVASTATIN** 02/27/20   [provider]  clonazePAM (KLONOPIN) 0.5 MG tablet Take 0.5 mg by mouth 3 (three) times daily.  02/18/18   [provider]  glimepiride (AMARYL) 4 MG tablet Take 4 mg by mouth daily. 02/27/20   [provider]  IBU 800 MG tablet Take 800 mg by mouth 2 (two) times daily as needed. 12/30/19   [provider]  Levothyroxine Sodium 200 MCG CAPS Take 200 mcg by mouth daily. 02/02/20   [provider]  losartan (COZAAR) 100 MG tablet Take 100 mg by mouth daily. 02/27/20   [provider]  metFORMIN (GLUCOPHAGE) 500 MG tablet TAKE ONE TABLET (500 MG) BY MOUTH TWICE DAILY 02/27/20   [provider]  nitroGLYCERIN (NITROSTAT) 0.4 MG SL tablet Place 1 tablet (0.4 mg total) under the tongue every 5 (five) minutes as needed for chest pain. 03/02/20 05/31/20  Debbe Odea, MD  ondansetron (ZOFRAN) 4 MG tablet Take 4 mg by mouth every 8 (eight) hours as needed. 02/27/20   [provider]  pantoprazole (PROTONIX) 40 MG tablet Take 40 mg by mouth daily. 02/27/20   [provider]  propranolol (INDERAL) 20 MG tablet Take 20 mg by mouth 3 (three) times daily. 02/27/20   [provider]  QUEtiapine (SEROQUEL) 100 MG tablet Take 100 mg by mouth at bedtime. 10/05/18   [provider]  simvastatin (ZOCOR) 20 MG tablet Take 20 mg by mouth at bedtime. 10/05/18   [provider]  torsemide (DEMADEX) 20 MG tablet Take 20 mg by mouth daily. 01/23/20   [provider]  traMADol (ULTRAM) 50 MG tablet Take 50 mg by mouth 4 (four) times daily as needed. Patient not taking: Reported on 03/01/2020 11/19/18   [provider]  ZOLOFT 50 MG tablet Take 50 mg by mouth  daily. 02/27/20   [provider]  zolpidem (AMBIEN) 10 MG tablet Take 10 mg by mouth at bedtime. Patient not taking: Reported on 03/01/2020 10/11/18   [provider]    Physical Exam: Vitals:   03/11/20 1000 03/11/20 1030 03/11/20 1100 03/11/20 1130  BP: (!) 172/93 (!) 172/106 (!) 156/94 (!) 159/86  Pulse: 89 90 91 92  Resp: (!) 28 15 (!) 23 19  Temp:      TempSrc:      SpO2: 100% 95% 95% 100%  Weight:      Height:       General: Not in acute distress HEENT:       Eyes: PERRL, EOMI, no scleral icterus.       ENT: No discharge from the ears and nose, no pharynx injection, no tonsillar enlargement.        Neck: Difficult to assess JVD due to  obesity, no bruit, no mass felt. Heme: No neck lymph node enlargement. Cardiac: S1/S2, RRR, No murmurs, No gallops or rubs. Respiratory: No rales, wheezing, rhonchi or rubs. GI: Soft, nondistended, nontender, no rebound pain, no organomegaly, BS present. GU: No hematuria Ext: 1+DP/PT pulse bilaterally. Has chronic venous insufficiency change with leg edema Musculoskeletal: No joint deformities, No joint redness or warmth, no limitation of ROM in spin. Skin: No rashes.  Neuro: Alert, oriented X3, cranial nerves II-XII grossly intact, moves all extremities normally Psych: Patient is not psychotic, no suicidal or hemocidal ideation.  Labs on Admission: I have personally reviewed following labs and imaging studies  CBC: Recent Labs  Lab 03/11/20 0803  WBC 9.6  HGB 10.9*  HCT 36.9  MCV 94.1  PLT 307   Basic Metabolic Panel: Recent Labs  Lab 03/11/20 0803  NA 138  K 4.9  CL 100  CO2 27  GLUCOSE 128*  BUN 22*  CREATININE 0.66  CALCIUM 9.0   GFR: Estimated Creatinine Clearance: 114 mL/min (by C-G formula based on SCr of 0.66 mg/dL). Liver Function Tests: No results for input(s): AST, ALT, ALKPHOS, BILITOT, PROT, ALBUMIN in the last 168 hours. No results for input(s): LIPASE, AMYLASE in the last 168 hours. No  results for input(s): AMMONIA in the last 168 hours. Coagulation Profile: No results for input(s): INR, PROTIME in the last 168 hours. Cardiac Enzymes: No results for input(s): CKTOTAL, CKMB, CKMBINDEX, TROPONINI in the last 168 hours. BNP (last 3 results) No results for input(s): PROBNP in the last 8760 hours. HbA1C: No results for input(s): HGBA1C in the last 72 hours. CBG: No results for input(s): GLUCAP in the last 168 hours. Lipid Profile: No results for input(s): CHOL, HDL, LDLCALC, TRIG, CHOLHDL, LDLDIRECT in the last 72 hours. Thyroid Function Tests: Recent Labs    03/11/20 0951  TSH 28.292*   Anemia Panel: No results for input(s): VITAMINB12, FOLATE, FERRITIN, TIBC, IRON, RETICCTPCT in the last 72 hours. Urine analysis:    Component Value Date/Time   COLORURINE YELLOW (A) 01/23/2019 0853   APPEARANCEUR CLOUDY (A) 01/23/2019 0853   APPEARANCEUR Clear 04/29/2017 0918   LABSPEC 1.023 01/23/2019 0853   PHURINE 6.0 01/23/2019 0853   GLUCOSEU NEGATIVE 01/23/2019 0853   HGBUR NEGATIVE 01/23/2019 0853   BILIRUBINUR NEGATIVE 01/23/2019 0853   BILIRUBINUR Negative 04/29/2017 0918   KETONESUR NEGATIVE 01/23/2019 0853   PROTEINUR NEGATIVE 01/23/2019 0853   NITRITE NEGATIVE 01/23/2019 0853   LEUKOCYTESUR TRACE (A) 01/23/2019 0853   Sepsis Labs: @LABRCNTIP (procalcitonin:4,lacticidven:4) ) Recent Results (from the past 240 hour(s))  Resp Panel by RT-PCR (Flu A&B, Covid) Nasopharyngeal Swab     Status: None   Collection Time: 03/11/20  8:46 AM   Specimen: Nasopharyngeal Swab; Nasopharyngeal(NP) swabs in vial transport medium  Result Value Ref Range Status   SARS Coronavirus 2 by RT PCR NEGATIVE NEGATIVE Final    Comment: (NOTE) SARS-CoV-2 target nucleic acids are NOT DETECTED.  The SARS-CoV-2 RNA is generally detectable in upper respiratory specimens during the acute phase of infection. The lowest concentration of SARS-CoV-2 viral copies this assay can detect is 138  copies/mL. A negative result does not preclude SARS-Cov-2 infection and should not be used as the sole basis for treatment or other patient management decisions. A negative result may occur with  improper specimen collection/handling, submission of specimen other than nasopharyngeal swab, presence of viral mutation(s) within the areas targeted by this assay, and inadequate number of viral copies(<138 copies/mL). A negative result must be combined  with clinical observations, patient history, and epidemiological information. The expected result is Negative.  Fact Sheet for Patients:  BloggerCourse.com  Fact Sheet for Healthcare Providers:  SeriousBroker.it  This test is no t yet approved or cleared by the [place] FDA and  has been authorized for detection and/or diagnosis of SARS-CoV-2 by FDA under an Emergency Use Authorization (EUA). This EUA will remain  in effect (meaning this test can be used) for the duration of the COVID-19 declaration under Section 564(b)(1) of the Act, 21 U.S.C.section 360bbb-3(b)(1), unless the authorization is terminated  or revoked sooner.       Influenza A by PCR NEGATIVE NEGATIVE Final   Influenza B by PCR NEGATIVE NEGATIVE Final    Comment: (NOTE) The Xpert Xpress SARS-CoV-2/FLU/RSV plus assay is intended as an aid in the diagnosis of influenza from Nasopharyngeal swab specimens and should not be used as a sole basis for treatment. Nasal washings and aspirates are unacceptable for Xpert Xpress SARS-CoV-2/FLU/RSV testing.  Fact Sheet for Patients: BloggerCourse.com  Fact Sheet for Healthcare Providers: SeriousBroker.it  This test is not yet approved or cleared by the [place] FDA and has been authorized for detection and/or diagnosis of SARS-CoV-2 by FDA under an Emergency Use Authorization (EUA). This EUA will remain in effect (meaning  this test can be used) for the duration of the COVID-19 declaration under Section 564(b)(1) of the Act, 21 U.S.C. section 360bbb-3(b)(1), unless the authorization is terminated or revoked.  Performed at [facility], [address], [place], [place] [address]      Radiological Exams on Admission: DG Chest 2 View  Result Date: 03/11/2020 CLINICAL DATA:  Chest pain EXAM: CHEST - 2 VIEW COMPARISON:  March 03, 2020 chest radiograph and chest CT FINDINGS: There is no edema or airspace opacity. Heart size and pulmonary vascularity are within normal limits. Postoperative change noted on the right with missing anterior right second rib. Monitor device noted on the left. No pneumothorax. IMPRESSION: Postoperative change on the right. No edema or airspace opacity. Stable cardiac silhouette. Electronically Signed   By: Bretta Bang III M.D.   On: 03/11/2020 08:28     EKG: I have personally reviewed.  Not done in ED, will get one.   Assessment/Plan Principal Problem:   Chest pain Active Problems:   Schizoaffective disorder, bipolar type (HCC)   Hypothyroidism   GERD (gastroesophageal reflux disease)   Edema of extremities   Asthma   Bipolar 1 disorder (HCC)   Depression with anxiety   Hypertension   HLD (hyperlipidemia)   Diabetes mellitus without complication (HCC)   Morbid obesity with BMI of 60.0-69.9, adult (HCC)   Acute respiratory failure with hypoxia (HCC)   Normocytic anemia   Chest pain: Etiology is not clear. Troponin level 3 -->3. Potential differential diagnosis includes CAD and pulmonary embolism. D-dimer is pending. Dr. Eden Emms of cardiology is consulted.  - admit to progressive unit as inpatient - Trend Trop - Repeat EKG in the am  - prn Nitroglycerin, lipitor, propranolol - Patient is allergic to aspirin - Risk factor stratification: will check FLP and A1C  - check UDS - 2d echo - f/u D-dimer -->negative  Acute respiratory failure with hypoxia:  Etiology is not clear. Differential diagnosis include ACS, dCHF, PE and possible OSA/hypoventilation due to morbid obesity. No wheezing or rhonchi on auscultation. Difficult to assess volume status due to morbid obesity -Follow-up D-dimer -->negative -Follow-up 2D echo -Nasal cannula oxygen to maintain oxygen saturation above 93% -Bronchodilators -need  outpt sleep study -Patient was given 60 mg of Lasix in ED -->  will follow up card recommendations for further diuresis  Hypothyroidism: TSH significantly elevated 31.77 on 02/22/2020 -Repeat TSH, if still elevated, will increase Synthroid dose. -Continue home Synthroid 200 mcg daily  Addendum: TSH level 28.29 -->will increase synthroid dose to 225 mcg daily tomorrow. Will give 150 mcg by IV today.  Schizoaffective disorder and  Bipolar 1 disorder and depression with anxiety: -pt is Klonopin, Seroquel, -Abilify IM injection every 28 day -also had Aristada  injection   GERD (gastroesophageal reflux disease) -Protonix  Edema of extremities -will continue home torsemide  -f/u 2D Echo  Asthma: Stable -Bronchodilators  Hypertension -IV hydralazine as needed -Propranolol, Cozaar  HLD (hyperlipidemia) -Lipitor  Diabetes mellitus without complication (HCC): No A1c on record. Blood sugar 128. Patient is taking Amaryl and Metformin at home -Sliding scale insulin -Check A1c  Morbid obesity with BMI of 60.0-69.9, adult (HCC): BMI 60.46 -Diet and exercise.   -Encouraged to lose weight.   Normocytic anemia: Hgb 11.8 on 03/03/20 -->10.9. No bleeding. -f/u by CBC         DVT ppx: SQ Lovenox Code Status: Full code Family Communication: not done, no family member is at bed side.   Disposition Plan:  Anticipate discharge back to previous environment Consults called:  Dr. Eden Emms of card Admission status and Level of care: Progressive Cardiac:   as inpt      Status is: Inpatient  Remains inpatient appropriate because:Inpatient  level of care appropriate due to severity of illness. Patient has multiple comorbidities, now presents with chest pain and acute respiratory failure with hypoxia, has new oxygen requirement. Etiology is not clear. Her presentation is highly complicated. Patient is at high risk of deterioration. Need to be treated in hospital for the least 2 days   Dispo: The patient is from: Home              Anticipated d/c is to: Home              Anticipated d/c date is: 2 days              Patient currently is not medically stable to d/c.   Difficult to place patient No           Date of Service 03/11/2020    Lorretta Harp [facility]   If 7PM-7AM, please contact night-coverage [email or web address] 03/11/2020, 12:07 PM

## 2020-03-11 NOTE — ED Notes (Signed)
Pt taken to xray 

## 2020-03-11 NOTE — ED Triage Notes (Signed)
Pt arrives to ER via [facility] from home for intermittent CP on L and R sides since friday. took a nitro without relief. EMS gave 324 asa. was 88% RA, placed on 2 L and came up to 97%. states has lymphedema. Arrives A&O, able to transfer self from stretcher to stretcher. Has had multiple cardiac workups that were negative per EMS.

## 2020-03-11 NOTE — ED Provider Notes (Signed)
[facility] Emergency Department Provider Note ____________________________________________   Event Date/Time   First MD Initiated Contact with Patient 03/11/20 [phone]     (approximate)  I have reviewed the triage vital signs and the nursing notes.  HISTORY  Chief Complaint Chest Pain   HPI Rhonda Palmer is a 50 y.o. femalewho presents to the ED for evaluation of chest pain.   Chart review indicates history of morbid obesity, HTN, HLD, DM, bipolar disorder.  Chronic edema and swelling on torsemide without echocardiogram in our system.  Non-smoker.  Never vaccinated for COVID.  Patient presents to the ED with about 3-4 hours of bilateral chest pain.  She reports feeling this pain on Friday, 2 days ago, resolving after a matter of hours, and then similar pain recurring this morning.  She reports bilateral chest wall pain that is nonradiating, sharp and achy, and worse with lying flat.  She reports fear of lying flat due to difficulty breathing.  Denies fever, cough, syncope, trauma, emesis, abdominal pain or dysuria.  Past Medical History:  Diagnosis Date  . Anxiety   . Asthma   . Bipolar 1 disorder (HCC)   . Diabetes mellitus without complication (HCC)   . Edema leg for twenty years per patient   taking lasix for edema  . Fibromyalgia   . Hypertension   . Ovarian cyst   . Schizo affective schizophrenia (HCC)   . Thyroid disease     Patient Active Problem List   Diagnosis Date Noted  . Fall 03/27/2017  . Long term (current) use of opiate analgesic 06/26/2016  . Long term prescription opiate use 06/26/2016  . Opiate use 06/26/2016  . Obesity 01/17/2016  . History of opioid abuse (HCC) 01/17/2016  . Asthma 01/17/2016  . Mixed hyperlipidemia 10/09/2015  . Hypokalemia 10/09/2015  . Headache above the eye region 06/16/2013  . Edema extremities 05/18/2013  . 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 GERD (gastroesophageal reflux  disease) 04/11/2013  . Personality disorder (HCC) 10/16/2012  . Chronic pain syndrome 06/18/2012  . S/P ankle fusion 05/21/2012  . Schizoaffective disorder, bipolar type (HCC) 02/20/2012  . 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 Hypothyroidism 11/16/2002    Past Surgical History:  Procedure Laterality Date  . ABDOMINAL HYSTERECTOMY    . ANKLE SURGERY     pt. states long time ago    Prior to Admission medications   Medication Sig Start Date End Date Taking? Authorizing Provider  albuterol (PROVENTIL HFA;VENTOLIN HFA) 108 (90 Base) MCG/ACT inhaler Inhale 2 puffs into the lungs every 6 (six) hours as needed for wheezing or shortness of breath.    [provider]  ARIPiprazole ER (ABILIFY MAINTENA) 300 MG SRER injection Inject 300 mg into the muscle every 28 (twenty-eight) days.    [provider]  ARISTADA INITIO 675 MG/2.4ML prefilled syringe Inject 2.4 ml intramuscularly single dose 02/27/20   [provider]  atorvastatin (LIPITOR) 80 MG tablet TAKE ONE TABLET BY MOUTH QHS.**THIS REPLACES SIMVASTATIN** 02/27/20   [provider]  clonazePAM (KLONOPIN) 0.5 MG tablet Take 0.5 mg by mouth 3 (three) times daily.  02/18/18   [provider]  glimepiride (AMARYL) 4 MG tablet Take 4 mg by mouth daily. 02/27/20   [provider]  IBU 800 MG tablet Take 800 mg by mouth 2 (two) times daily as needed. 12/30/19   [provider]  Levothyroxine Sodium 200 MCG CAPS Take 200 mcg by mouth daily. 02/02/20   [provider]  losartan (COZAAR) 100 MG tablet Take 100 mg by mouth daily. 02/27/20   [provider]  metFORMIN (GLUCOPHAGE) 500 MG tablet TAKE ONE TABLET (500 MG) BY MOUTH TWICE DAILY 02/27/20   [provider]  nitroGLYCERIN (NITROSTAT) 0.4 MG SL tablet Place 1 tablet (0.4 mg total) under the tongue every 5 (five) minutes as  needed for chest pain. 03/02/20 05/31/20  Debbe OdeaAgbor-Etang, Brian, MD  ondansetron (ZOFRAN) 4 MG tablet Take 4 mg by mouth every 8 (eight) hours as needed. 02/27/20   [provider]  pantoprazole (PROTONIX) 40 MG tablet Take 40 mg by mouth daily. 02/27/20   [provider]  propranolol (INDERAL) 20 MG tablet Take 20 mg by mouth 3 (three) times daily. 02/27/20   [provider]  QUEtiapine (SEROQUEL) 100 MG tablet Take 100 mg by mouth at bedtime. 10/05/18   [provider]  simvastatin (ZOCOR) 20 MG tablet Take 20 mg by mouth at bedtime. 10/05/18   [provider]  torsemide (DEMADEX) 20 MG tablet Take 20 mg by mouth daily. 01/23/20   [provider]  traMADol (ULTRAM) 50 MG tablet Take 50 mg by mouth 4 (four) times daily as needed. Patient not taking: Reported on 03/01/2020 11/19/18   [provider]  ZOLOFT 50 MG tablet Take 50 mg by mouth daily. 02/27/20   [provider]  zolpidem (AMBIEN) 10 MG tablet Take 10 mg by mouth at bedtime. Patient not taking: Reported on 03/01/2020 10/11/18   [provider]    Allergies Codeine, Sulfa antibiotics, Penicillins, and Aspirin  Family History  Problem Relation Age of Onset  . Heart failure Mother   . Bladder Cancer Neg Hx   . Kidney cancer Neg Hx     Social History Social History   Tobacco Use  . Smoking status: Never Smoker  . Smokeless tobacco: Never Used  Vaping Use  . Vaping Use: Never used  Substance Use Topics  . Alcohol use: No  . Drug use: No    Review of Systems  Constitutional: No fever/chills Eyes: No visual changes. ENT: No sore throat. Cardiovascular: Positive for chest pain Respiratory: Denies shortness of breath. Gastrointestinal: No abdominal pain.  No nausea, no vomiting.  No diarrhea.  No constipation. Genitourinary: Negative for dysuria. Musculoskeletal: Negative for back pain. Skin: Negative for rash. Neurological: Negative for headaches, focal  weakness or numbness.  ____________________________________________   PHYSICAL EXAM:  VITAL SIGNS: Vitals:   03/11/20 0900 03/11/20 0930  BP: (!) 150/87 (!) 146/88  Pulse: 88 87  Resp: (!) 22 20  Temp:    SpO2: 99% 99%     Constitutional: Alert and oriented. Well appearing and in no acute distress.  Transfers herself from EMS stretcher to our stretcher.  Conversational in full sentences. Eyes: Conjunctivae are normal. PERRL. EOMI. Head: Atraumatic. Nose: No congestion/rhinnorhea. Mouth/Throat: Mucous membranes are moist.  Oropharynx non-erythematous. Neck: No stridor. No cervical spine tenderness to palpation. Cardiovascular: Normal rate, regular rhythm. Grossly normal heart sounds.  Good peripheral circulation. Respiratory: Minimal tachypnea to the low 20s.  Clear lungs without wheezing. Gastrointestinal: Soft , nondistended, nontender to palpation. No CVA tenderness. Musculoskeletal: Chronic venous stasis dermatitis no signs of acute trauma. Neurologic:  Normal speech and language. No gross focal neurologic deficits are appreciated.  Skin:  Skin is warm, dry and intact. No rash noted. Psychiatric: Mood and affect are normal. Speech  and behavior are normal.  ____________________________________________   LABS (all labs ordered are listed, but only abnormal results are displayed)  Labs Reviewed  BASIC METABOLIC PANEL - Abnormal; Notable for the following components:      Result Value   Glucose, Bld 128 (*)    BUN 22 (*)    All other components within normal limits  CBC - Abnormal; Notable for the following components:   Hemoglobin 10.9 (*)    MCHC 29.5 (*)    RDW 17.2 (*)    nRBC 0.5 (*)    All other components within normal limits  RESP PANEL BY RT-PCR (FLU A&B, COVID) ARPGX2  BRAIN NATRIURETIC PEPTIDE  TROPONIN I (HIGH SENSITIVITY)  TROPONIN I (HIGH SENSITIVITY)   ____________________________________________  12 Lead EKG  Sinus rhythm, rate of 94 bpm.   Normal axis.  Normal intervals.  Wandering baseline.  No evidence of acute ischemia. ____________________________________________  RADIOLOGY  ED MD interpretation: 2 view CXR reviewed by me without evidence of acute cardiopulmonary pathology.  Official radiology report(s): DG Chest 2 View  Result Date: 03/11/2020 CLINICAL DATA:  Chest pain EXAM: CHEST - 2 VIEW COMPARISON:  March 03, 2020 chest radiograph and chest CT FINDINGS: There is no edema or airspace opacity. Heart size and pulmonary vascularity are within normal limits. Postoperative change noted on the right with missing anterior right second rib. Monitor device noted on the left. No pneumothorax. IMPRESSION: Postoperative change on the right. No edema or airspace opacity. Stable cardiac silhouette. Electronically Signed   By: Bretta Bang III M.D.   On: 03/11/2020 08:28    ____________________________________________   PROCEDURES and INTERVENTIONS  Procedure(s) performed (including Critical Care):  .1-3 Lead EKG Interpretation Performed by: Delton Prairie, MD Authorized by: Delton Prairie, MD     Interpretation: normal     ECG rate:  86   ECG rate assessment: normal     Rhythm: sinus rhythm     Ectopy: none     Conduction: normal      Medications  furosemide (LASIX) injection 60 mg (has no administration in time range)  ketorolac (TORADOL) 30 MG/ML injection 15 mg (15 mg Intravenous Given 03/11/20 0928)    ____________________________________________   MDM / ED COURSE   50 year old woman presents to the ED with chest pain and hypoxia, possibly due to CHF exacerbation, and requiring medical admission.  Hypoxic to 87/88% on room air, otherwise hemodynamically stable.  Exam with morbid obesity and mild tachypnea, but no distress, signs of trauma or neurovascular deficits.  Chronic venous stasis dermatitis is present.  Blood work is generally unremarkable.  BNP is not elevated, though her obesity may cloud this.  Her  clinical symptoms and picture to me is most concerning for a CHF exacerbation.  She has no wheezing or poor air movement to suggest COPD/asthma.  EKG is nonischemic and troponins are negative without evidence of ACS.  CXR without infiltrate or pneumonia.  Due to her continued hypoxia and discomfort, we will initiate gentle diuresis and discussed the case with hospitalist medicine for  admission.  Clinical Course as of 03/11/20 1042  Sun Mar 11, 2020  1975 Reassessed.  Patient reports some improvement pain after Toradol.  We discussed relatively benign work-up so far.  I turn off her supplemental oxygen so we can further observe and assess if she is hypoxic. [DS]  0956 She dips down to 88% on room air with a good waveform.  Return nasal cannula supplemental O2 back to 2 L  and her sats improved.  I discussed with her my concern for hypoxia and recommendation for medical admission pending Covid swab.  She is agreeable. [DS]    Clinical Course User Index [DS] Delton Prairie, MD    ____________________________________________   FINAL CLINICAL IMPRESSION(S) / ED DIAGNOSES  Final diagnoses:  Other chest pain  Hypoxia     ED Discharge Orders    None       Heinrich Fertig Katrinka Blazing   Note:  This document was prepared using Dragon voice recognition software and may include unintentional dictation errors.   Delton Prairie, MD 03/11/20 1044

## 2020-03-12 ENCOUNTER — Inpatient Hospital Stay (HOSPITAL_COMMUNITY)
Admit: 2020-03-12 | Discharge: 2020-03-12 | Disposition: A | Discharge status: Home / Self Care | Payer: Medicaid Other | Attending: Physician Assistant | Admitting: Physician Assistant

## 2020-03-12 DIAGNOSIS — R079 Chest pain, unspecified: Secondary | ICD-10-CM

## 2020-03-12 DIAGNOSIS — E119 Type 2 diabetes mellitus without complications: Secondary | ICD-10-CM

## 2020-03-12 DIAGNOSIS — R0609 Other forms of dyspnea: Secondary | ICD-10-CM

## 2020-03-12 DIAGNOSIS — J9601 Acute respiratory failure with hypoxia: Secondary | ICD-10-CM | POA: Diagnosis not present

## 2020-03-12 DIAGNOSIS — R0789 Other chest pain: Secondary | ICD-10-CM

## 2020-03-12 DIAGNOSIS — F319 Bipolar disorder, unspecified: Secondary | ICD-10-CM | POA: Diagnosis not present

## 2020-03-12 DIAGNOSIS — R0600 Dyspnea, unspecified: Secondary | ICD-10-CM

## 2020-03-12 DIAGNOSIS — J4520 Mild intermittent asthma, uncomplicated: Secondary | ICD-10-CM | POA: Diagnosis not present

## 2020-03-12 LAB — CBC
HCT: 37.3 % (ref 36.0–46.0)
Hemoglobin: 11.3 g/dL — ABNORMAL LOW (ref 12.0–15.0)
MCH: 28.4 pg (ref 26.0–34.0)
MCHC: 30.3 g/dL (ref 30.0–36.0)
MCV: 93.7 fL (ref 80.0–100.0)
Platelets: 313 10*3/uL (ref 150–400)
RBC: 3.98 MIL/uL (ref 3.87–5.11)
RDW: 17.2 % — ABNORMAL HIGH (ref 11.5–15.5)
WBC: 7.2 10*3/uL (ref 4.0–10.5)
nRBC: 0.4 % — ABNORMAL HIGH (ref 0.0–0.2)

## 2020-03-12 LAB — ECHOCARDIOGRAM COMPLETE
AR max vel: 3.32 cm2
AV Area VTI: 3.39 cm2
AV Area mean vel: 3.35 cm2
AV Mean grad: 2 mmHg
AV Peak grad: 4.2 mmHg
Ao pk vel: 1.03 m/s
Area-P 1/2: 3.6 cm2
Height: 61 in
MV VTI: 2.58 cm2
S' Lateral: 3.4 cm
Weight: 5628.8 oz

## 2020-03-12 LAB — GLUCOSE, CAPILLARY
Glucose-Capillary: 107 mg/dL — ABNORMAL HIGH (ref 70–99)
Glucose-Capillary: 126 mg/dL — ABNORMAL HIGH (ref 70–99)
Glucose-Capillary: 113 mg/dL — ABNORMAL HIGH (ref 70–99)
Glucose-Capillary: 125 mg/dL — ABNORMAL HIGH (ref 70–99)

## 2020-03-12 LAB — HEMOGLOBIN A1C
Hgb A1c MFr Bld: 6.4 % — ABNORMAL HIGH (ref 4.8–5.6)
Mean Plasma Glucose: 136.98 mg/dL

## 2020-03-12 LAB — LIPID PANEL
Cholesterol: 255 mg/dL — ABNORMAL HIGH (ref 0–200)
HDL: 73 mg/dL (ref >40)
LDL Cholesterol: 147 mg/dL — ABNORMAL HIGH (ref 0–99)
Total CHOL/HDL Ratio: 3.5 RATIO
Triglycerides: 177 mg/dL — ABNORMAL HIGH (ref <150)
VLDL: 35 mg/dL (ref 0–40)

## 2020-03-12 LAB — MAGNESIUM: Magnesium: 2.3 mg/dL (ref 1.7–2.4)

## 2020-03-12 MED ORDER — SODIUM CHLORIDE 0.9% FLUSH
3.0000 mL | Freq: Two times a day (BID) | INTRAVENOUS | Status: DC
  Administered 2020-03-12: 3 mL via INTRAVENOUS

## 2020-03-12 MED ORDER — SODIUM CHLORIDE 0.9% FLUSH: 3.0000 mL | INTRAVENOUS | Status: DC | PRN

## 2020-03-12 MED ORDER — LIDOCAINE VISCOUS HCL 2 % MT SOLN
15.0000 mL | Freq: Once | OROMUCOSAL | Status: AC
  Administered 2020-03-12: 15 mL via ORAL
  Filled 2020-03-12: qty 15

## 2020-03-12 MED ORDER — SODIUM CHLORIDE 0.9 % IV SOLN: INTRAVENOUS | Status: DC

## 2020-03-12 MED ORDER — PERFLUTREN LIPID MICROSPHERE
1.0000 mL | INTRAVENOUS | Status: AC | PRN
  Administered 2020-03-12: 2 mL via INTRAVENOUS
  Filled 2020-03-12: qty 10

## 2020-03-12 MED ORDER — SODIUM CHLORIDE 0.9 % IV SOLN: 250.0000 mL | INTRAVENOUS | Status: DC | PRN

## 2020-03-12 MED ORDER — ALUM & MAG HYDROXIDE-SIMETH 200-200-20 MG/5ML PO SUSP
30.0000 mL | Freq: Once | ORAL | Status: AC
  Administered 2020-03-12: 30 mL via ORAL
  Filled 2020-03-12: qty 30

## 2020-03-12 MED ORDER — KETOROLAC TROMETHAMINE 30 MG/ML IJ SOLN
30.0000 mg | Freq: Once | INTRAMUSCULAR | Status: AC
  Administered 2020-03-12: 30 mg via INTRAVENOUS
  Filled 2020-03-12: qty 1

## 2020-03-12 MED ORDER — ASPIRIN 81 MG PO CHEW
81.0000 mg | CHEWABLE_TABLET | ORAL | Status: AC
  Administered 2020-03-13: 81 mg via ORAL
  Filled 2020-03-12: qty 1

## 2020-03-12 NOTE — TOC Progression Note (Signed)
Transition of Care [facility]) - Progression Note    Patient Details  Name: Rhonda Palmer MRN: [id number] Date of Birth: [birth year]  Transition of Care [facility]) CM/SW Contact  Hetty Ely, RN Phone Number: 03/12/2020, 1:16 PM  Clinical Narrative: Spoke with patient about discharge needs, she says she lives [place] [address] with her 52yo daughter, who will assist with any needs she may have. Patient voices being independent with ADL's and do her own shopping and cooking. No TOC needs assessed at this time,will continue to track.           Expected Discharge Plan and Services                                                 Social Determinants of Health (SDOH) Interventions    Readmission Risk Interventions No flowsheet data found.

## 2020-03-12 NOTE — Progress Notes (Signed)
PROGRESS NOTE    Rhonda Palmer  LZJ:[id number] DOB: August 10, 1970 DOA: 03/11/2020 PCP: Grayland Jack, FNP   Brief Narrative: Taken from H&P. Rhonda Palmer is a 50 y.o. female with medical history significant of morbid obesity, BMI 60, HTN, HLD, DM, asthma, hypothyroidism, schizophrenia, bipolar, leg edema, history of benzo and opiate abuse in remission, DVT not on AC.  Presents with intermittent CP.  Patient states that she has been having intermittent chest pain for almost 2 weeks, which has worsened in the past 2 days. It is located in substernal area, pressure and sharp pain, 7-8 out of 10 severity, nonradiating. It is not pleuritic. Patient denies cough, fever or chills. It is associated with palpitation and shortness of breath. No tenderness in calf areas. Patient has nausea, no vomiting, diarrhea or abdominal pain. No symptoms of UTI or unilateral weakness. Review of Epic shows numerous ED visits for varying complaints.  Patient had CT angiogram of chest on 03/10/2020 whichwas limited secondary to body habitus and respiratory motion as well as a suboptimal contrast timing though no obvious PE was identified. Patient was seen by Dr. Azucena Cecil of cardiology on 03/01/2020 in clinic. She was scheduled for Lexiscan MPI and echo, both of which are scheduled for later this month.  EKG without any acute changes and troponin remain negative. Due to her body habitus and persistent pain cardiology decided to proceed with cardiac catheterization tomorrow morning as stress testing might not be very helpful and echo is very limited due to body habitus  Subjective: Patient continued to have substernal chest pain.  Nonradiating, no change in character with deep breathing or by pressing on the chest wall.  Stating that tramadol did help yesterday.  She wants to proceed with stress testing or cardiac catheterization for definitive diagnosis.  No relationship with exertion.  Assessment & Plan:    Principal Problem:   Chest pain Active Problems:   Schizoaffective disorder, bipolar type (HCC)   Hypothyroidism   GERD (gastroesophageal reflux disease)   Edema of extremities   Asthma   Bipolar 1 disorder (HCC)   Depression with anxiety   Hypertension   HLD (hyperlipidemia)   Diabetes mellitus without complication (HCC)   Morbid obesity with BMI of 60.0-69.9, adult (HCC)   Acute respiratory failure with hypoxia (HCC)   Normocytic anemia  Chest pain.  Mostly atypical.  Troponin remain negative.  She had normal echo but limited due to body habitus.  Cardiology was consulted and they decided to proceed with cardiac catheterization tomorrow morning for definitive diagnosis as stress testing will be limited because of her morbid obesity. -Continue with propranolol and Lipitor.  Acute hypoxic respiratory failure.  Concern of obesity hypoventilation syndrome.  Patient was not on any oxygen at home.  Saturating well on 2 L.  D-dimer negative.  Echocardiogram without any significant abnormality. Patient needs sleep study as an outpatient. -Continue with supplemental oxygen to keep the saturation above 90%.  Hypothyroidism.  TSH remains elevated at 28.29, it was elevated at 31.77 in January 2020 and  at that time her Synthroid dose was increased. Synthroid dose was increased to 225 MCG by admitting provider starting from today. -Continue with Synthroid to 25 MCG daily. -Patient will need a repeat TSH in 4 weeks  Schizoaffective disorder and  Bipolar 1 disorder and depression with anxiety: -pt is Klonopin, Seroquel, -Abilify IM injection every 28 day -also had Aristada  injection   GERD (gastroesophageal reflux disease) -Protonix  Edema of extremities -will continue  home torsemide   Asthma: Stable -Bronchodilators  Hypertension -IV hydralazine as needed -Propranolol, Cozaar  HLD (hyperlipidemia) -Lipitor  Diabetes mellitus without complication [facility]):  Patient is taking  Amaryl and Metformin at home.  A1c of 6.4 and CBG within goal. -Sliding scale insulin -Check A1c  Morbid obesity with BMI of 60.0-69.9, adult (HCC): BMI 60.46 -Diet and exercise.  -Encouraged to lose weight.  Normocytic anemia: Hgb 11.8 on 03/03/20 -->10.9. No bleeding. -f/u by CBC  Objective: Vitals:   03/12/20 0018 03/12/20 0532 03/12/20 0802 03/12/20 1149  BP: 118/74 (!) 91/56 112/79 109/82  Pulse: 70 70 77 68  Resp: Temp: 98.3 F (36.8 C) 98.7 F (37.1 C) 97.6 F (36.4 C) 97.7 F (36.5 C)  TempSrc: Oral Oral Oral Oral  SpO2: 98% 91% 97% 99%  Weight: (!) 159.6 kg     Height:        Intake/Output Summary (Last 24 hours) at 03/12/2020 1433 Last data filed at 03/11/2020 2320 Gross per 24 hour  Intake --  Output 300 ml  Net -300 ml   Filed Weights   03/11/20 0749 03/12/20 0018  Weight: (!) 145.2 kg (!) 159.6 kg    Examination:  General exam: Morbidly obese lady, appears calm and comfortable  Respiratory system: Clear to auscultation. Respiratory effort normal. Cardiovascular system: S1 & S2 heard, RRR. No JVD, murmurs, rubs, gallops or clicks. Gastrointestinal system: Soft, nontender, nondistended, bowel sounds positive. Central nervous system: Alert and oriented. No focal neurological deficits.. Extremities: No edema, no cyanosis, pulses intact and symmetrical. Psychiatry: Judgement and insight appear normal. Mood & affect appropriate.    DVT prophylaxis: Lovenox Code Status: Full Family Communication: Discussed with patient Disposition Plan:  Status is: Inpatient  Remains inpatient appropriate because:Inpatient level of care appropriate due to severity of illness   Dispo: The patient is from: Home              Anticipated d/c is to: Home              Anticipated d/c date is: 1 day              Patient currently is not medically stable to d/c.   Difficult to place patient No              Level of care: Progressive Cardiac  Consultants:    Cardiology  Procedures:  Antimicrobials:   Data Reviewed: I have personally reviewed following labs and imaging studies  CBC: Recent Labs  Lab 03/11/20 0803 03/12/20 0431  WBC 9.6 7.2  HGB 10.9* 11.3*  HCT 36.9 37.3  MCV 94.1 93.7  PLT 307 313   Basic Metabolic Panel: Recent Labs  Lab 03/11/20 0803 03/12/20 0431  NA 138  --   K 4.9  --   CL 100  --   CO2 27  --   GLUCOSE 128*  --   BUN 22*  --   CREATININE 0.66  --   CALCIUM 9.0  --   MG  --  2.3   GFR: Estimated Creatinine Clearance: 121.5 mL/min (by C-G formula based on SCr of 0.66 mg/dL). Liver Function Tests: No results for input(s): AST, ALT, ALKPHOS, BILITOT, PROT, ALBUMIN in the last 168 hours. No results for input(s): LIPASE, AMYLASE in the last 168 hours. No results for input(s): AMMONIA in the last 168 hours. Coagulation Profile: No results for input(s): INR, PROTIME in the last 168 hours. Cardiac Enzymes: No results for input(s):  CKTOTAL, CKMB, CKMBINDEX, TROPONINI in the last 168 hours. BNP (last 3 results) No results for input(s): PROBNP in the last 8760 hours. HbA1C: Recent Labs    03/12/20 0431  HGBA1C 6.4*   CBG: Recent Labs  Lab 03/11/20 1244 03/11/20 2047 03/11/20 2212 03/12/20 0804 03/12/20 1150  GLUCAP 108* 114* 117* 107* 126*   Lipid Profile: Recent Labs    03/12/20 0431  CHOL 255*  HDL 73  LDLCALC 147*  TRIG 177*  CHOLHDL 3.5   Thyroid Function Tests: Recent Labs    03/11/20 0951  TSH 28.292*   Anemia Panel: No results for input(s): VITAMINB12, FOLATE, FERRITIN, TIBC, IRON, RETICCTPCT in the last 72 hours. Sepsis Labs: No results for input(s): PROCALCITON, LATICACIDVEN in the last 168 hours.  Recent Results (from the past 240 hour(s))  Resp Panel by RT-PCR (Flu A&B, Covid) Nasopharyngeal Swab     Status: None   Collection Time: 03/11/20  8:46 AM   Specimen: Nasopharyngeal Swab; Nasopharyngeal(NP) swabs in vial transport medium  Result Value Ref Range  Status   SARS Coronavirus 2 by RT PCR NEGATIVE NEGATIVE Final    Comment: (NOTE) SARS-CoV-2 target nucleic acids are NOT DETECTED.  The SARS-CoV-2 RNA is generally detectable in upper respiratory specimens during the acute phase of infection. The lowest concentration of SARS-CoV-2 viral copies this assay can detect is 138 copies/mL. A negative result does not preclude SARS-Cov-2 infection and should not be used as the sole basis for treatment or other patient management decisions. A negative result may occur with  improper specimen collection/handling, submission of specimen other than nasopharyngeal swab, presence of viral mutation(s) within the areas targeted by this assay, and inadequate number of viral copies(<138 copies/mL). A negative result must be combined with clinical observations, patient history, and epidemiological information. The expected result is Negative.  Fact Sheet for Patients:  BloggerCourse.comhttps://www.fda.gov/media/152166/download  Fact Sheet for Healthcare Providers:  SeriousBroker.ithttps://www.fda.gov/media/152162/download  This test is no t yet approved or cleared by the [place] FDA and  has been authorized for detection and/or diagnosis of SARS-CoV-2 by FDA under an Emergency Use Authorization (EUA). This EUA will remain  in effect (meaning this test can be used) for the duration of the COVID-19 declaration under Section 564(b)(1) of the Act, 21 U.S.C.section 360bbb-3(b)(1), unless the authorization is terminated  or revoked sooner.       Influenza A by PCR NEGATIVE NEGATIVE Final   Influenza B by PCR NEGATIVE NEGATIVE Final    Comment: (NOTE) The Xpert Xpress SARS-CoV-2/FLU/RSV plus assay is intended as an aid in the diagnosis of influenza from Nasopharyngeal swab specimens and should not be used as a sole basis for treatment. Nasal washings and aspirates are unacceptable for Xpert Xpress SARS-CoV-2/FLU/RSV testing.  Fact Sheet for  Patients: BloggerCourse.comhttps://www.fda.gov/media/152166/download  Fact Sheet for Healthcare Providers: SeriousBroker.ithttps://www.fda.gov/media/152162/download  This test is not yet approved or cleared by the [place] FDA and has been authorized for detection and/or diagnosis of SARS-CoV-2 by FDA under an Emergency Use Authorization (EUA). This EUA will remain in effect (meaning this test can be used) for the duration of the COVID-19 declaration under Section 564(b)(1) of the Act, 21 U.S.C. section 360bbb-3(b)(1), unless the authorization is terminated or revoked.  Performed at [facility], [address] [place], [place] [address]      Radiology Studies: DG Chest 2 View  Result Date: 03/11/2020 CLINICAL DATA:  Chest pain EXAM: CHEST - 2 VIEW COMPARISON:  March 03, 2020 chest radiograph and chest CT FINDINGS:  There is no edema or airspace opacity. Heart size and pulmonary vascularity are within normal limits. Postoperative change noted on the right with missing anterior right second rib. Monitor device noted on the left. No pneumothorax. IMPRESSION: Postoperative change on the right. No edema or airspace opacity. Stable cardiac silhouette. Electronically Signed   By: Bretta Bang III M.D.   On: 03/11/2020 08:28   ECHOCARDIOGRAM COMPLETE  Result Date: 03/12/2020    ECHOCARDIOGRAM REPORT   Patient Name:   MILLER EDGINGTON Date of Exam: 03/12/2020 Medical Rec #:  [id number]        Height:       61.0 in Accession #:    [id number]       Weight:       351.8 lb Date of Birth:  [birth year]        BSA:          2.401 m Patient Age:    49 years         BP:           112/79 mmHg Patient Gender: F                HR:           77 bpm. Exam Location:  [facility] Procedure: 2D Echo, Color Doppler, Cardiac Doppler and Intracardiac            Opacification Agent Indications:     R07.9 Chest Pain  History:         Patient has no prior history of Echocardiogram examinations.                  Risk Factors:Hypertension and  Diabetes.  Sonographer:     Humphrey Rolls RDCS (AE) Referring Phys:  [id number] Sondra Barges Diagnosing Phys: Lorine Bears MD  Sonographer Comments: Technically difficult study due to poor echo windows. Image acquisition challenging due to patient body habitus. IMPRESSIONS  1. Left ventricular ejection fraction, by estimation, is 55 to 60%. The left ventricle has normal function. The left ventricle has no regional wall motion abnormalities. Left ventricular diastolic parameters were normal.  2. Right ventricular systolic function is normal. The right ventricular size is normal. Tricuspid regurgitation signal is inadequate for assessing PA pressure.  3. The mitral valve is normal in structure. No evidence of mitral valve regurgitation. No evidence of mitral stenosis.  4. The aortic valve is normal in structure. Aortic valve regurgitation is not visualized. No aortic stenosis is present. FINDINGS  Left Ventricle: Left ventricular ejection fraction, by estimation, is 55 to 60%. The left ventricle has normal function. The left ventricle has no regional wall motion abnormalities. Definity contrast agent was given IV to delineate the left ventricular  endocardial borders. The left ventricular internal cavity size was normal in size. There is no left ventricular hypertrophy. Left ventricular diastolic parameters were normal. Right Ventricle: The right ventricular size is normal. No increase in right ventricular wall thickness. Right ventricular systolic function is normal. Tricuspid regurgitation signal is inadequate for assessing PA pressure. Left Atrium: Left atrial size was normal in size. Right Atrium: Right atrial size was normal in size. Pericardium: There is no evidence of pericardial effusion. Mitral Valve: The mitral valve is normal in structure. No evidence of mitral valve regurgitation. No evidence of mitral valve stenosis. MV peak gradient, 3.1 mmHg. The mean mitral valve gradient is 1.0 mmHg. Tricuspid Valve: The  tricuspid valve is normal in structure. Tricuspid valve regurgitation is not demonstrated. No  evidence of tricuspid stenosis. Aortic Valve: The aortic valve is normal in structure. Aortic valve regurgitation is not visualized. No aortic stenosis is present. Aortic valve mean gradient measures 2.0 mmHg. Aortic valve peak gradient measures 4.2 mmHg. Aortic valve area, by VTI measures 3.39 cm. Pulmonic Valve: The pulmonic valve was normal in structure. Pulmonic valve regurgitation is not visualized. No evidence of pulmonic stenosis. Aorta: The aortic root is normal in size and structure. Venous: The inferior vena cava was not well visualized. IAS/Shunts: No atrial level shunt detected by color flow Doppler.  LEFT VENTRICLE PLAX 2D LVIDd:         4.60 cm  Diastology LVIDs:         3.40 cm  LV e' medial:    9.14 cm/s LV PW:         0.90 cm  LV E/e' medial:  7.9 LV IVS:        1.10 cm  LV e' lateral:   7.51 cm/s LVOT diam:     2.20 cm  LV E/e' lateral: 9.6 LV SV:         54 LV SV Index:   22 LVOT Area:     3.80 cm  LEFT ATRIUM         Index LA diam:    3.50 cm 1.46 cm/m  AORTIC VALVE                   PULMONIC VALVE AV Area (Vmax):    3.32 cm    PV Vmax:       1.29 m/s AV Area (Vmean):   3.35 cm    PV Vmean:      90.300 cm/s AV Area (VTI):     3.39 cm    PV VTI:        0.199 m AV Vmax:           103.00 cm/s PV Peak grad:  6.7 mmHg AV Vmean:          72.100 cm/s PV Mean grad:  4.0 mmHg AV VTI:            0.159 m AV Peak Grad:      4.2 mmHg AV Mean Grad:      2.0 mmHg LVOT Vmax:         90.00 cm/s LVOT Vmean:        63.500 cm/s LVOT VTI:          0.142 m LVOT/AV VTI ratio: 0.89  AORTA Ao Root diam: 3.00 cm MITRAL VALVE MV Area (PHT): 3.60 cm    SHUNTS MV Area VTI:   2.58 cm    Systemic VTI:  0.14 m MV Peak grad:  3.1 mmHg    Systemic Diam: 2.20 cm MV Mean grad:  1.0 mmHg MV Vmax:       0.89 m/s MV Vmean:      52.0 cm/s MV Decel Time: 211 msec MV E velocity: 72.00 cm/s MV A velocity: 62.10 cm/s MV E/A ratio:  1.16  Lorine Bears MD Electronically signed by Lorine Bears MD Signature Date/Time: 03/12/2020/12:31:31 PM    Final     Scheduled Meds: . atorvastatin  80 mg Oral QHS  . clonazePAM  0.5 mg Oral TID  . enoxaparin (LOVENOX) injection  0.5 mg/kg Subcutaneous Q24H  . insulin aspart  0-5 Units Subcutaneous QHS  . insulin aspart  0-9 Units Subcutaneous TID WC  . levothyroxine  225 mcg Oral Daily  . losartan  100 mg Oral Daily  . pantoprazole  40 mg Oral Daily  . propranolol  20 mg Oral TID  . QUEtiapine  600 mg Oral QHS  . torsemide  20 mg Oral Daily   Continuous Infusions:   LOS: 1 day   Time spent: 40 minutes.  Arnetha Courser, MD [facility]  If 7PM-7AM, please contact night-coverage [email or web address]  03/12/2020, 2:33 PM   This record has been created using Conservation officer, historic buildings. Errors have been sought and corrected,but may not always be located. Such creation errors do not reflect on the standard of care.

## 2020-03-12 NOTE — H&P (View-Only) (Signed)
 Progress Note  Patient Name: Rhonda Palmer Date of Encounter: 03/12/2020  [facility] Cardiologist: Dr. Agbor-Etang  Subjective   Patient reports she is still having chest pain, yesterday tramadol improved pain. Echo performed this AM. No evidence of PE on labs. Tele with no significant arrhythmias.  Inpatient Medications    Scheduled Meds: . atorvastatin  80 mg Oral QHS  . clonazePAM  0.5 mg Oral TID  . enoxaparin (LOVENOX) injection  0.5 mg/kg Subcutaneous Q24H  . insulin aspart  0-5 Units Subcutaneous QHS  . insulin aspart  0-9 Units Subcutaneous TID WC  . levothyroxine  225 mcg Oral Daily  . levothyroxine  150 mcg Intravenous Once  . losartan  100 mg Oral Daily  . pantoprazole  40 mg Oral Daily  . propranolol  20 mg Oral TID  . QUEtiapine  600 mg Oral QHS  . torsemide  20 mg Oral Daily   Continuous Infusions:  PRN Meds: acetaminophen, albuterol, dextromethorphan-guaiFENesin, hydrALAZINE, ibuprofen, nitroGLYCERIN, ondansetron (ZOFRAN) IV, traMADol, zolpidem   Vital Signs    Vitals:   03/11/20 2201 03/12/20 0018 03/12/20 0532 03/12/20 0802  BP: 102/68 118/74 (!) 91/56 112/79  Pulse: 67 70 70 77  Resp: 17 18 18 17  Temp: 98 F (36.7 C) 98.3 F (36.8 C) 98.7 F (37.1 C) 97.6 F (36.4 C)  TempSrc: Oral Oral Oral Oral  SpO2: 95% 98% 91% 97%  Weight:  (!) 159.6 kg    Height:        Intake/Output Summary (Last 24 hours) at 03/12/2020 0857 Last data filed at 03/11/2020 2320 Gross per 24 hour  Intake -  Output 300 ml  Net -300 ml   Last 3 Weights 03/12/2020 03/11/2020 03/03/2020  Weight (lbs) 351 lb 12.8 oz 320 lb 326 lb  Weight (kg) 159.575 kg 145.151 kg 147.873 kg      Telemetry    NSr, HR 70s - Personally Reviewed  ECG    No new - Personally Reviewed  Physical Exam   GEN: No acute distress.   Neck: No JVD Cardiac: RRR, no murmurs, rubs, or gallops.  Respiratory: diminished GI: Soft, nontender, non-distended  MS: No edema; No  deformity. Neuro:  Nonfocal  Psych: Normal affect   Labs    High Sensitivity Troponin:   Recent Labs  Lab 02/22/20 1309 03/03/20 1043 03/11/20 0803 03/11/20 0951 03/11/20 1725  TROPONINIHS 5 4 3 3 4      Chemistry Recent Labs  Lab 03/11/20 0803  NA 138  K 4.9  CL 100  CO2 27  GLUCOSE 128*  BUN 22*  CREATININE 0.66  CALCIUM 9.0  GFRNONAA >60  ANIONGAP 11     Hematology Recent Labs  Lab 03/11/20 0803 03/12/20 0431  WBC 9.6 7.2  RBC 3.92 3.98  HGB 10.9* 11.3*  HCT 36.9 37.3  MCV 94.1 93.7  MCH 27.8 28.4  MCHC 29.5* 30.3  RDW 17.2* 17.2*  PLT 307 313    BNP Recent Labs  Lab 03/11/20 0803  BNP 30.7     DDimer No results for input(s): DDIMER in the last 168 hours.   Radiology    DG Chest 2 View  Result Date: 03/11/2020 CLINICAL DATA:  Chest pain EXAM: CHEST - 2 VIEW COMPARISON:  March 03, 2020 chest radiograph and chest CT FINDINGS: There is no edema or airspace opacity. Heart size and pulmonary vascularity are within normal limits. Postoperative change noted on the right with missing anterior right second rib. Monitor device   noted on the left. No pneumothorax. IMPRESSION: Postoperative change on the right. No edema or airspace opacity. Stable cardiac silhouette. Electronically Signed   By: Rhonda  Woodruff Palmer M.D.   On: 03/11/2020 08:28    Cardiac Studies   Echo ordered  Patient Profile     50 y.o. 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 pregnancy, lymphedema, prior BZD and opiate abuse, and obesity who is being seen for chest pain.   Assessment & Plan    Chest pain - Patient reported atypical chest pain. Hs troponin negative x 5. EKG without ischemic changes - Patient previously seen in the office for chest pain and  Given NTG. Zio patch applied and Lexiscan and echo were ordered - she has moderate risk for cardiac  etiology. RF include HTN, HLD, diabetes, and obesity - Echo performed - She reports she still has chest pain, Yesterday Tramadol improved the pain  - Further recs echo. If echo is normal can continue with OP lexiscan as previously scheduled  Dyspnea - Volume status difficult to assess due to body habitus - CXR without evidence of CHF - EKG with LVH - prior CT chest showed no PR although evaluation limited due to body habitus. Can consider V/Q scan. Fibrin  derivatives normal - s/p IV lasix 60mg x1 in the ED - d-dimer/fibrin derivatives normal - Echo as above  - PTA torsemide 20mg daily>>continued on admission  Palpitations - Reports palpitations have not occurred during admission. They are generally worse at night - Tele with no significant arrhythmia - Currently wearing Zio patch - TSH elevated started on levothyroxine - can likely follow-up as OP  HTN - PTA meds continued>> losartan 10mg daily and toresemide  Psychiatric history - per IM  Abnormal TSH - TSH 28 this admission - started on levothyroxine - per IM  For questions or updates, please contact [facility] Please consult [email or web address] for contact info under        Signed, Rhonda Palmer H Hason Ofarrell, PA-C  03/12/2020, 8:58 AM   

## 2020-03-12 NOTE — Progress Notes (Signed)
*  PRELIMINARY RESULTS* Echocardiogram 2D Echocardiogram has been performed.  Rhonda Palmer Sample 03/12/2020, 9:22 AM

## 2020-03-12 NOTE — Progress Notes (Signed)
Progress Note  Patient Name: Rhonda Palmer Date of Encounter: 03/12/2020  [facility] Cardiologist: Dr. Azucena Cecil  Subjective   Patient reports she is still having chest pain, yesterday tramadol improved pain. Echo performed this AM. No evidence of PE on labs. Tele with no significant arrhythmias.  Inpatient Medications    Scheduled Meds: . atorvastatin  80 mg Oral QHS  . clonazePAM  0.5 mg Oral TID  . enoxaparin (LOVENOX) injection  0.5 mg/kg Subcutaneous Q24H  . insulin aspart  0-5 Units Subcutaneous QHS  . insulin aspart  0-9 Units Subcutaneous TID WC  . levothyroxine  225 mcg Oral Daily  . levothyroxine  150 mcg Intravenous Once  . losartan  100 mg Oral Daily  . pantoprazole  40 mg Oral Daily  . propranolol  20 mg Oral TID  . QUEtiapine  600 mg Oral QHS  . torsemide  20 mg Oral Daily   Continuous Infusions:  PRN Meds: acetaminophen, albuterol, dextromethorphan-guaiFENesin, hydrALAZINE, ibuprofen, nitroGLYCERIN, ondansetron (ZOFRAN) IV, traMADol, zolpidem   Vital Signs    Vitals:   03/11/20 2201 03/12/20 0018 03/12/20 0532 03/12/20 0802  BP: 102/68 118/74 (!) 91/56 112/79  Pulse: 67 70 70 77  Resp: 17 18 18 17   Temp: 98 F (36.7 C) 98.3 F (36.8 C) 98.7 F (37.1 C) 97.6 F (36.4 C)  TempSrc: Oral Oral Oral Oral  SpO2: 95% 98% 91% 97%  Weight:  (!) 159.6 kg    Height:        Intake/Output Summary (Last 24 hours) at 03/12/2020 0857 Last data filed at 03/11/2020 2320 Gross per 24 hour  Intake -  Output 300 ml  Net -300 ml   Last 3 Weights 03/12/2020 03/11/2020 03/03/2020  Weight (lbs) 351 lb 12.8 oz 320 lb 326 lb  Weight (kg) 159.575 kg 145.151 kg 147.873 kg      Telemetry    NSr, HR 70s - Personally Reviewed  ECG    No new - Personally Reviewed  Physical Exam   GEN: No acute distress.   Neck: No JVD Cardiac: RRR, no murmurs, rubs, or gallops.  Respiratory: diminished GI: Soft, nontender, non-distended  MS: No edema; No  deformity. Neuro:  Nonfocal  Psych: Normal affect   Labs    High Sensitivity Troponin:   Recent Labs  Lab 02/22/20 1309 03/03/20 1043 03/11/20 0803 03/11/20 0951 03/11/20 1725  TROPONINIHS 5 4 3 3 4       Chemistry Recent Labs  Lab 03/11/20 0803  NA 138  K 4.9  CL 100  CO2 27  GLUCOSE 128*  BUN 22*  CREATININE 0.66  CALCIUM 9.0  GFRNONAA >60  ANIONGAP 11     Hematology Recent Labs  Lab 03/11/20 0803 03/12/20 0431  WBC 9.6 7.2  RBC 3.92 3.98  HGB 10.9* 11.3*  HCT 36.9 37.3  MCV 94.1 93.7  MCH 27.8 28.4  MCHC 29.5* 30.3  RDW 17.2* 17.2*  PLT 307 313    BNP Recent Labs  Lab 03/11/20 0803  BNP 30.7     DDimer No results for input(s): DDIMER in the last 168 hours.   Radiology    DG Chest 2 View  Result Date: 03/11/2020 CLINICAL DATA:  Chest pain EXAM: CHEST - 2 VIEW COMPARISON:  March 03, 2020 chest radiograph and chest CT FINDINGS: There is no edema or airspace opacity. Heart size and pulmonary vascularity are within normal limits. Postoperative change noted on the right with missing anterior right second rib. Monitor device  noted on the left. No pneumothorax. IMPRESSION: Postoperative change on the right. No edema or airspace opacity. Stable cardiac silhouette. Electronically Signed   By: Bretta Bang III M.D.   On: 03/11/2020 08:28    Cardiac Studies   Echo ordered  Patient Profile     50 y.o. 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 pregnancy, lymphedema, prior BZD and opiate abuse, and obesity who is being seen for chest pain.   Assessment & Plan    Chest pain - Patient reported atypical chest pain. Hs troponin negative x 5. EKG without ischemic changes - Patient previously seen in the office for chest pain and  Given NTG. Zio patch applied and Lexiscan and echo were ordered - she has moderate risk for cardiac  etiology. RF include HTN, HLD, diabetes, and obesity - Echo performed - She reports she still has chest pain, Yesterday Tramadol improved the pain  - Further recs echo. If echo is normal can continue with OP lexiscan as previously scheduled  Dyspnea - Volume status difficult to assess due to body habitus - CXR without evidence of CHF - EKG with LVH - prior CT chest showed no PR although evaluation limited due to body habitus. Can consider V/Q scan. Fibrin  derivatives normal - s/p IV lasix 60mg  x1 in the ED - d-dimer/fibrin derivatives normal - Echo as above  - PTA torsemide 20mg  daily>>continued on admission  Palpitations - Reports palpitations have not occurred during admission. They are generally worse at night - Tele with no significant arrhythmia - Currently wearing Zio patch - TSH elevated started on levothyroxine - can likely follow-up as OP  HTN - PTA meds continued>> losartan 10mg  daily and toresemide  Psychiatric history - per IM  Abnormal TSH - TSH 28 this admission - started on levothyroxine - per IM  For questions or updates, please contact [facility] Please consult [email or web address] for contact info under        Signed, Hakan Nudelman , PA-C  03/12/2020, 8:58 AM

## 2020-03-13 ENCOUNTER — Encounter
Admission: EM | Disposition: A | Discharge status: Home Health | Payer: Self-pay | Source: Home / Self Care | Attending: Family Medicine

## 2020-03-13 DIAGNOSIS — I5031 Acute diastolic (congestive) heart failure: Secondary | ICD-10-CM

## 2020-03-13 DIAGNOSIS — I200 Unstable angina: Secondary | ICD-10-CM

## 2020-03-13 HISTORY — PX: LEFT HEART CATH AND CORONARY ANGIOGRAPHY: CATH118249

## 2020-03-13 LAB — GLUCOSE, CAPILLARY
Glucose-Capillary: 104 mg/dL — ABNORMAL HIGH (ref 70–99)
Glucose-Capillary: 94 mg/dL (ref 70–99)
Glucose-Capillary: 131 mg/dL — ABNORMAL HIGH (ref 70–99)

## 2020-03-13 SURGERY — LEFT HEART CATH AND CORONARY ANGIOGRAPHY
Anesthesia: Moderate Sedation

## 2020-03-13 MED ORDER — FENTANYL CITRATE (PF) 100 MCG/2ML IJ SOLN
INTRAMUSCULAR | Status: AC
  Filled 2020-03-13: qty 2

## 2020-03-13 MED ORDER — FENTANYL CITRATE (PF) 100 MCG/2ML IJ SOLN
INTRAMUSCULAR | Status: DC | PRN
  Administered 2020-03-13: 25 ug via INTRAVENOUS

## 2020-03-13 MED ORDER — VERAPAMIL HCL 2.5 MG/ML IV SOLN
INTRAVENOUS | Status: DC | PRN
  Administered 2020-03-13: 2.5 mg via INTRAVENOUS

## 2020-03-13 MED ORDER — HEPARIN (PORCINE) IN NACL 1000-0.9 UT/500ML-% IV SOLN
INTRAVENOUS | Status: AC
  Filled 2020-03-13: qty 1000

## 2020-03-13 MED ORDER — HEPARIN SODIUM (PORCINE) 1000 UNIT/ML IJ SOLN
INTRAMUSCULAR | Status: DC | PRN
  Administered 2020-03-13: 5000 [IU] via INTRAVENOUS

## 2020-03-13 MED ORDER — MIDAZOLAM HCL 2 MG/2ML IJ SOLN
INTRAMUSCULAR | Status: DC | PRN
  Administered 2020-03-13: 1 mg via INTRAVENOUS

## 2020-03-13 MED ORDER — LIDOCAINE HCL (PF) 1 % IJ SOLN
INTRAMUSCULAR | Status: AC
  Filled 2020-03-13: qty 30

## 2020-03-13 MED ORDER — MIDAZOLAM HCL 2 MG/2ML IJ SOLN
INTRAMUSCULAR | Status: AC
  Filled 2020-03-13: qty 2

## 2020-03-13 MED ORDER — VERAPAMIL HCL 2.5 MG/ML IV SOLN
INTRAVENOUS | Status: AC
  Filled 2020-03-13: qty 2

## 2020-03-13 MED ORDER — FUROSEMIDE 10 MG/ML IJ SOLN
40.0000 mg | Freq: Two times a day (BID) | INTRAMUSCULAR | Status: DC
  Administered 2020-03-13 – 2020-03-14 (×2): 40 mg via INTRAVENOUS
  Filled 2020-03-13 (×2): qty 4

## 2020-03-13 MED ORDER — TRAMADOL HCL 50 MG PO TABS
ORAL_TABLET | ORAL | Status: AC
  Administered 2020-03-13: 50 mg via ORAL
  Filled 2020-03-13: qty 1

## 2020-03-13 MED ORDER — SODIUM CHLORIDE 0.9% FLUSH
3.0000 mL | Freq: Two times a day (BID) | INTRAVENOUS | Status: DC
  Administered 2020-03-13 – 2020-03-18 (×10): 3 mL via INTRAVENOUS

## 2020-03-13 MED ORDER — HEPARIN SODIUM (PORCINE) 1000 UNIT/ML IJ SOLN
INTRAMUSCULAR | Status: AC
  Filled 2020-03-13: qty 1

## 2020-03-13 MED ORDER — HEPARIN (PORCINE) IN NACL 1000-0.9 UT/500ML-% IV SOLN
INTRAVENOUS | Status: DC | PRN
  Administered 2020-03-13: 500 mL

## 2020-03-13 MED ORDER — ONDANSETRON HCL 4 MG/2ML IJ SOLN
INTRAMUSCULAR | Status: AC
  Administered 2020-03-13: 4 mg
  Filled 2020-03-13: qty 2

## 2020-03-13 MED ORDER — LABETALOL HCL 5 MG/ML IV SOLN: 10.0000 mg | INTRAVENOUS | Status: AC | PRN

## 2020-03-13 MED ORDER — SODIUM CHLORIDE 0.9% FLUSH: 3.0000 mL | INTRAVENOUS | Status: DC | PRN

## 2020-03-13 MED ORDER — SODIUM CHLORIDE 0.9 % IV SOLN: 250.0000 mL | INTRAVENOUS | Status: DC | PRN

## 2020-03-13 MED ORDER — IOHEXOL 300 MG/ML  SOLN
INTRAMUSCULAR | Status: DC | PRN
  Administered 2020-03-13: 75 mL

## 2020-03-13 SURGICAL SUPPLY — 9 items
CATH INFINITI 5 FR JL3.5 (CATHETERS) ×2 IMPLANT
CATH INFINITI JR4 5F (CATHETERS) ×2 IMPLANT
DEVICE RAD COMP TR BAND LRG (VASCULAR PRODUCTS) ×2 IMPLANT
GLIDESHEATH SLEND SS 6F .021 (SHEATH) ×2 IMPLANT
GUIDEWIRE INQWIRE 1.5J.035X260 (WIRE) ×1 IMPLANT
INQWIRE 1.5J .035X260CM (WIRE) ×2
KIT MANI 3VAL PERCEP (MISCELLANEOUS) ×2 IMPLANT
PACK CARDIAC CATH (CUSTOM PROCEDURE TRAY) ×2 IMPLANT
PANNUS RETENTION SYSTEM 2 PAD (MISCELLANEOUS) ×2 IMPLANT

## 2020-03-13 NOTE — Interval H&P Note (Signed)
History and Physical Interval Note:  03/13/2020 10:21 AM  Rhonda Palmer  has presented today for surgery, with the diagnosis of unstable angina.  The various methods of treatment have been discussed with the patient and family. After consideration of risks, benefits and other options for treatment, the patient has consented to  Procedure(s): LEFT HEART CATH AND CORONARY ANGIOGRAPHY (N/A) as a surgical intervention.  The patient's history has been reviewed, patient examined, no change in status, stable for surgery.  I have reviewed the patient's chart and labs.  Questions were answered to the patient's satisfaction.    Cath Lab Visit (complete for each Cath Lab visit)  Clinical Evaluation Leading to the Procedure:   ACS: Yes.    Non-ACS:  N/A  Sherina Stammer

## 2020-03-13 NOTE — Progress Notes (Signed)
Progress Note  Patient Name: Rhonda Palmer Date of Encounter: 03/13/2020  [facility] Cardiologist: Dr. Azucena Cecil  Subjective   Continued chest pain and mild shortness of breath.  No palpitations.  Inpatient Medications    Scheduled Meds: . [MAR Hold] atorvastatin  80 mg Oral QHS  . [MAR Hold] clonazePAM  0.5 mg Oral TID  . [MAR Hold] enoxaparin (LOVENOX) injection  0.5 mg/kg Subcutaneous Q24H  . [MAR Hold] insulin aspart  0-5 Units Subcutaneous QHS  . [MAR Hold] insulin aspart  0-9 Units Subcutaneous TID WC  . [MAR Hold] levothyroxine  225 mcg Oral Daily  . [MAR Hold] losartan  100 mg Oral Daily  . [MAR Hold] pantoprazole  40 mg Oral Daily  . [MAR Hold] propranolol  20 mg Oral TID  . [MAR Hold] QUEtiapine  600 mg Oral QHS  . sodium chloride flush  3 mL Intravenous Q12H  . [MAR Hold] torsemide  20 mg Oral Daily   Continuous Infusions: . sodium chloride    . sodium chloride 100 mL/hr at 03/13/20 0524   PRN Meds: sodium chloride, [MAR Hold] acetaminophen, [MAR Hold] albuterol, [MAR Hold] dextromethorphan-guaiFENesin, [MAR Hold] hydrALAZINE, [MAR Hold] ibuprofen, [MAR Hold] nitroGLYCERIN, [MAR Hold] ondansetron (ZOFRAN) IV, sodium chloride flush, [MAR Hold] traMADol, [MAR Hold] zolpidem   Vital Signs    Vitals:   03/13/20 0338 03/13/20 0856 03/13/20 0928 03/13/20 1023  BP: 125/81 129/70 123/76   Pulse: 75 75 77   Resp: 17 18 15    Temp: 98.4 F (36.9 C) 99.1 F (37.3 C)    TempSrc: Oral Oral    SpO2: 98% 96% 94% 97%  Weight: 116.7 kg     Height:        Intake/Output Summary (Last 24 hours) at 03/13/2020 1106 Last data filed at 03/12/2020 1453 Gross per 24 hour  Intake --  Output 550 ml  Net -550 ml   Last 3 Weights 03/13/2020 03/12/2020 03/11/2020  Weight (lbs) 257 lb 3.2 oz 351 lb 12.8 oz 320 lb  Weight (kg) 116.665 kg 159.575 kg 145.151 kg      Telemetry    Sinus rhythm - Personally Reviewed  ECG    No new EKG.  Physical Exam   GEN: No acute  distress.   Neck: Unable to assess JVP due to body habitus. Cardiac: Distant heart sounds.  RRR w/o murmurs. Respiratory: Mildly diminished breath sounds throughout. GI: Soft, nontender, non-distended  MS: 1+ pretibial edema. Neuro:  Nonfocal  Psych: Normal affect   Labs    High Sensitivity Troponin:   Recent Labs  Lab 02/22/20 1309 03/03/20 1043 03/11/20 0803 03/11/20 0951 03/11/20 1725  TROPONINIHS 5 4 3 3 4       Chemistry Recent Labs  Lab 03/11/20 0803  NA 138  K 4.9  CL 100  CO2 27  GLUCOSE 128*  BUN 22*  CREATININE 0.66  CALCIUM 9.0  GFRNONAA >60  ANIONGAP 11     Hematology Recent Labs  Lab 03/11/20 0803 03/12/20 0431  WBC 9.6 7.2  RBC 3.92 3.98  HGB 10.9* 11.3*  HCT 36.9 37.3  MCV 94.1 93.7  MCH 27.8 28.4  MCHC 29.5* 30.3  RDW 17.2* 17.2*  PLT 307 313    BNP Recent Labs  Lab 03/11/20 0803  BNP 30.7     DDimer No results for input(s): DDIMER in the last 168 hours.   Radiology    CARDIAC CATHETERIZATION  Result Date: 03/13/2020 Conclusions: 1. No angiographically significant coronary artery  disease. 2. Severely elevated left ventricular filling pressure (LVEFD ~40 mmHg at Eliannah Hinde-expiration) with marked respiratory variation. 3. Grossly normal left ventricular contraction. Recommendations: 1. Escalate diuresis for treatment of acute HFpEF. 2. Consider workup for other etiologies of chest pain per hospitalist service if chest pain persists. 3. Primary prevention of coronary artery disease. Yvonne Kendallhristopher Faizon Capozzi, MD [facility]   ECHOCARDIOGRAM COMPLETE  Result Date: 03/12/2020    ECHOCARDIOGRAM REPORT   Patient Name:   Rhonda Palmer Date of Exam: 03/12/2020 Medical Rec #:  [id number]        Height:       61.0 in Accession #:    [id number]       Weight:       351.8 lb Date of Birth:  Sep 26, 1970        BSA:          2.401 m Patient Age:    49 years         BP:           112/79 mmHg Patient Gender: F                HR:           77 bpm. Exam Location:   [facility] Procedure: 2D Echo, Color Doppler, Cardiac Doppler and Intracardiac            Opacification Agent Indications:     R07.9 Chest Pain  History:         Patient has no prior history of Echocardiogram examinations.                  Risk Factors:Hypertension and Diabetes.  Sonographer:     Humphrey RollsJoan Heiss RDCS (AE) Referring Phys:  [id number] Sondra BargesYAN M DUNN Diagnosing Phys: Lorine BearsMuhammad Arida MD  Sonographer Comments: Technically difficult study due to poor echo windows. Image acquisition challenging due to patient body habitus. IMPRESSIONS  1. Left ventricular ejection fraction, by estimation, is 55 to 60%. The left ventricle has normal function. The left ventricle has no regional wall motion abnormalities. Left ventricular diastolic parameters were normal.  2. Right ventricular systolic function is normal. The right ventricular size is normal. Tricuspid regurgitation signal is inadequate for assessing PA pressure.  3. The mitral valve is normal in structure. No evidence of mitral valve regurgitation. No evidence of mitral stenosis.  4. The aortic valve is normal in structure. Aortic valve regurgitation is not visualized. No aortic stenosis is present. FINDINGS  Left Ventricle: Left ventricular ejection fraction, by estimation, is 55 to 60%. The left ventricle has normal function. The left ventricle has no regional wall motion abnormalities. Definity contrast agent was given IV to delineate the left ventricular  endocardial borders. The left ventricular internal cavity size was normal in size. There is no left ventricular hypertrophy. Left ventricular diastolic parameters were normal. Right Ventricle: The right ventricular size is normal. No increase in right ventricular wall thickness. Right ventricular systolic function is normal. Tricuspid regurgitation signal is inadequate for assessing PA pressure. Left Atrium: Left atrial size was normal in size. Right Atrium: Right atrial size was normal in size. Pericardium: There is no  evidence of pericardial effusion. Mitral Valve: The mitral valve is normal in structure. No evidence of mitral valve regurgitation. No evidence of mitral valve stenosis. MV peak gradient, 3.1 mmHg. The mean mitral valve gradient is 1.0 mmHg. Tricuspid Valve: The tricuspid valve is normal in structure. Tricuspid valve regurgitation is not demonstrated. No evidence of tricuspid stenosis. Aortic  Valve: The aortic valve is normal in structure. Aortic valve regurgitation is not visualized. No aortic stenosis is present. Aortic valve mean gradient measures 2.0 mmHg. Aortic valve peak gradient measures 4.2 mmHg. Aortic valve area, by VTI measures 3.39 cm. Pulmonic Valve: The pulmonic valve was normal in structure. Pulmonic valve regurgitation is not visualized. No evidence of pulmonic stenosis. Aorta: The aortic root is normal in size and structure. Venous: The inferior vena cava was not well visualized. IAS/Shunts: No atrial level shunt detected by color flow Doppler.  LEFT VENTRICLE PLAX 2D LVIDd:         4.60 cm  Diastology LVIDs:         3.40 cm  LV e' medial:    9.14 cm/s LV PW:         0.90 cm  LV E/e' medial:  7.9 LV IVS:        1.10 cm  LV e' lateral:   7.51 cm/s LVOT diam:     2.20 cm  LV E/e' lateral: 9.6 LV SV:         54 LV SV Index:   22 LVOT Area:     3.80 cm  LEFT ATRIUM         Index LA diam:    3.50 cm 1.46 cm/m  AORTIC VALVE                   PULMONIC VALVE AV Area (Vmax):    3.32 cm    PV Vmax:       1.29 m/s AV Area (Vmean):   3.35 cm    PV Vmean:      90.300 cm/s AV Area (VTI):     3.39 cm    PV VTI:        0.199 m AV Vmax:           103.00 cm/s PV Peak grad:  6.7 mmHg AV Vmean:          72.100 cm/s PV Mean grad:  4.0 mmHg AV VTI:            0.159 m AV Peak Grad:      4.2 mmHg AV Mean Grad:      2.0 mmHg LVOT Vmax:         90.00 cm/s LVOT Vmean:        63.500 cm/s LVOT VTI:          0.142 m LVOT/AV VTI ratio: 0.89  AORTA Ao Root diam: 3.00 cm MITRAL VALVE MV Area (PHT): 3.60 cm    SHUNTS MV Area  VTI:   2.58 cm    Systemic VTI:  0.14 m MV Peak grad:  3.1 mmHg    Systemic Diam: 2.20 cm MV Mean grad:  1.0 mmHg MV Vmax:       0.89 m/s MV Vmean:      52.0 cm/s MV Decel Time: 211 msec MV E velocity: 72.00 cm/s MV A velocity: 62.10 cm/s MV E/A ratio:  1.16 Lorine Bears MD Electronically signed by Lorine Bears MD Signature Date/Time: 03/12/2020/12:31:31 PM    Final     Cardiac Studies   See above  Patient Profile     50 y.o. 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 pregnancy, lymphedema, prior BZD and opiate abuse, and obesity who is being seen for chest pain.  Assessment & Plan    Unstable angina: CP concerning  for unstable angina given risk factors.  Patient referred for [facility] today given persistent pain, which showed no significant CAD.  LVEDP noted to be severely elevated.  Primary prevention of CAD.  Continue propranolol, as beta-blockers may be helpful for management of microvascular dysfunction.  Acute HFpEF: Exam challenging due to morbid obesity.  LVEDP quite elevated on cath with significant respiratory variation often seen with underlying OSA.  Switch torsemide to furosemide 40 mg IV BID with goal net negative fluid balance of at leas 1-2 L in 24 hours.  Continue propranolol and losartan.  Consider addition of SGLT2 inhibitor; can be discussed as an outpatient with primary cardiologist.  Hypertension: Adequately controlled today.  Continue current medications.  For questions or updates, please contact [facility] Please consult [email or web address] for contact info under [facility].      Signed, Yvonne Kendall, MD  03/13/2020, 11:06 AM

## 2020-03-13 NOTE — Progress Notes (Signed)
PROGRESS NOTE    Rhonda Bollmanara Lise Crutcher  WUJ:[id number] DOB: 29-Jan-1970 DOA: 03/11/2020 PCP: [place] JackWilliams, Christine G, FNP   Brief Narrative: Taken from H&P. Rhonda Palmer is a 50 y.o. female with medical history significant of morbid obesity, BMI 60, HTN, HLD, DM, asthma, hypothyroidism, schizophrenia, bipolar, leg edema, history of benzo and opiate abuse in remission, DVT not on AC.  Presents with intermittent CP.  Patient states that she has been having intermittent chest pain for almost 2 weeks, which has worsened in the past 2 days. It is located in substernal area, pressure and sharp pain, 7-8 out of 10 severity, nonradiating. It is not pleuritic. Patient denies cough, fever or chills. It is associated with palpitation and shortness of breath. No tenderness in calf areas. Patient has nausea, no vomiting, diarrhea or abdominal pain. No symptoms of UTI or unilateral weakness. Review of Epic shows numerous ED visits for varying complaints.  Patient had CT angiogram of chest on 03/10/2020 whichwas limited secondary to body habitus and respiratory motion as well as a suboptimal contrast timing though no obvious PE was identified. Patient was seen by Dr. Azucena CecilAgbor-Etang of cardiology on 03/01/2020 in clinic. She was scheduled for Lexiscan MPI and echo, both of which are scheduled for later this month.  EKG without any acute changes and troponin remain negative. Due to her body habitus and persistent pain cardiology decided to proceed with cardiac catheterization which was negative for any significant stenosis but did showed markedly elevated LV EDP.  Cardiology started her on diuresis.  Subjective: Patient continued to have some substernal chest discomfort.  Denies any shortness of breath while resting.  Waiting for cardiac catheterization later today.  Assessment & Plan:   Principal Problem:   Chest pain Active Problems:   Schizoaffective disorder, bipolar type (HCC)   Hypothyroidism   GERD  (gastroesophageal reflux disease)   Edema of extremities   Asthma   Bipolar 1 disorder (HCC)   Depression with anxiety   Hypertension   HLD (hyperlipidemia)   Diabetes mellitus without complication (HCC)   Morbid obesity with BMI of 60.0-69.9, adult (HCC)   Acute respiratory failure with hypoxia (HCC)   Normocytic anemia   Unstable angina (HCC)  Chest pain.  Mostly atypical.  Troponin remain negative.  She had normal echo but limited due to body habitus.  Cardiology was consulted and they decided to proceed with cardiac catheterization tomorrow morning for definitive diagnosis as stress testing will be limited because of her morbid obesity. -Continue with propranolol and Lipitor.  New onset HFpEF.  Cardiology is recommending diuresis. -Continue with Lasix -Monitor BMP  Acute hypoxic respiratory failure.  Concern of obesity hypoventilation syndrome.  Patient was not on any oxygen at home.  Saturating well on 2 L.  D-dimer negative.  Echocardiogram without any significant abnormality. Patient needs sleep study as an outpatient. -Continue with supplemental oxygen to keep the saturation above 90%.  Hypothyroidism.  TSH remains elevated at 28.29, it was elevated at 31.77 in January 2020 and  at that time her Synthroid dose was increased. Synthroid dose was increased to 225 MCG by admitting provider starting from today. -Continue with Synthroid to 25 MCG daily. -Patient will need a repeat TSH in 4 weeks  Schizoaffective disorder and  Bipolar 1 disorder and depression with anxiety: -pt is Klonopin, Seroquel, -Abilify IM injection every 28 day -also had Aristada  injection   GERD (gastroesophageal reflux disease) -Protonix  Edema of extremities -will continue home torsemide   Asthma: Stable -  Bronchodilators  Hypertension -IV hydralazine as needed -Propranolol, Cozaar  HLD (hyperlipidemia) -Lipitor  Diabetes mellitus without complication [employer]):  Patient is taking Amaryl  and Metformin at home.  A1c of 6.4 and CBG within goal. -Sliding scale insulin  Morbid obesity with BMI of 60.0-69.9, adult (HCC): BMI 60.46 -Diet and exercise.  -Encouraged to lose weight.  Normocytic anemia: Hgb 11.8 on 03/03/20 -->10.9. No bleeding. -f/u by CBC  Objective: Vitals:   03/13/20 1300 03/13/20 1315 03/13/20 1330 03/13/20 1400  BP: (!) 123/92  (!) 123/108 115/76  Pulse: 76 78  75  Resp: 15 16 16 18   Temp:    97.6 F (36.4 C)  TempSrc:    Oral  SpO2: 95% 95%  97%  Weight:      Height:        Intake/Output Summary (Last 24 hours) at 03/13/2020 1743 Last data filed at 03/13/2020 1713 Gross per 24 hour  Intake 392.32 ml  Output 1850 ml  Net -1457.68 ml   Filed Weights   03/11/20 0749 03/12/20 0018 03/13/20 0338  Weight: (!) 145.2 kg (!) 159.6 kg 116.7 kg    Examination:  General.  Morbidly obese lady, in no acute distress. Pulmonary.  Lungs clear bilaterally, normal respiratory effort. CV.  Regular rate and rhythm, no JVD, rub or murmur. Abdomen.  Soft, nontender, nondistended, BS positive. CNS.  Alert and oriented x3.  No focal neurologic deficit. Extremities.  No edema, no cyanosis, pulses intact and symmetrical. Psychiatry.  Judgment and insight appears normal.   DVT prophylaxis: Lovenox Code Status: Full Family Communication: Discussed with patient Disposition Plan:  Status is: Inpatient  Remains inpatient appropriate because:Inpatient level of care appropriate due to severity of illness   Dispo: The patient is from: Home              Anticipated d/c is to: Home              Anticipated d/c date is: 1 day              Patient currently is not medically stable to d/c.   Difficult to place patient No              Level of care: Progressive Cardiac  Consultants:   Cardiology  Procedures:  Antimicrobials:   Data Reviewed: I have personally reviewed following labs and imaging studies  CBC: Recent Labs  Lab 03/11/20 0803  03/12/20 0431  WBC 9.6 7.2  HGB 10.9* 11.3*  HCT 36.9 37.3  MCV 94.1 93.7  PLT 307 313   Basic Metabolic Panel: Recent Labs  Lab 03/11/20 0803 03/12/20 0431  NA 138  --   K 4.9  --   CL 100  --   CO2 27  --   GLUCOSE 128*  --   BUN 22*  --   CREATININE 0.66  --   CALCIUM 9.0  --   MG  --  2.3   GFR: Estimated Creatinine Clearance: 99 mL/min (by C-G formula based on SCr of 0.66 mg/dL). Liver Function Tests: No results for input(s): AST, ALT, ALKPHOS, BILITOT, PROT, ALBUMIN in the last 168 hours. No results for input(s): LIPASE, AMYLASE in the last 168 hours. No results for input(s): AMMONIA in the last 168 hours. Coagulation Profile: No results for input(s): INR, PROTIME in the last 168 hours. Cardiac Enzymes: No results for input(s): CKTOTAL, CKMB, CKMBINDEX, TROPONINI in the last 168 hours. BNP (last 3 results) No results for input(s): PROBNP in the  last 8760 hours. HbA1C: Recent Labs    03/12/20 0431  HGBA1C 6.4*   CBG: Recent Labs  Lab 03/12/20 1732 03/12/20 2007 03/13/20 0854 03/13/20 0936 03/13/20 1636  GLUCAP 113* 125* 104* 94 131*   Lipid Profile: Recent Labs    03/12/20 0431  CHOL 255*  HDL 73  LDLCALC 147*  TRIG 177*  CHOLHDL 3.5   Thyroid Function Tests: Recent Labs    03/11/20 0951  TSH 28.292*   Anemia Panel: No results for input(s): VITAMINB12, FOLATE, FERRITIN, TIBC, IRON, RETICCTPCT in the last 72 hours. Sepsis Labs: No results for input(s): PROCALCITON, LATICACIDVEN in the last 168 hours.  Recent Results (from the past 240 hour(s))  Resp Panel by RT-PCR (Flu A&B, Covid) Nasopharyngeal Swab     Status: None   Collection Time: 03/11/20  8:46 AM   Specimen: Nasopharyngeal Swab; Nasopharyngeal(NP) swabs in vial transport medium  Result Value Ref Range Status   SARS Coronavirus 2 by RT PCR NEGATIVE NEGATIVE Final    Comment: (NOTE) SARS-CoV-2 target nucleic acids are NOT DETECTED.  The SARS-CoV-2 RNA is generally detectable  in upper respiratory specimens during the acute phase of infection. The lowest concentration of SARS-CoV-2 viral copies this assay can detect is 138 copies/mL. A negative result does not preclude SARS-Cov-2 infection and should not be used as the sole basis for treatment or other patient management decisions. A negative result may occur with  improper specimen collection/handling, submission of specimen other than nasopharyngeal swab, presence of viral mutation(s) within the areas targeted by this assay, and inadequate number of viral copies(<138 copies/mL). A negative result must be combined with clinical observations, patient history, and epidemiological information. The expected result is Negative.  Fact Sheet for Patients:  BloggerCourse.com  Fact Sheet for Healthcare Providers:  SeriousBroker.it  This test is no t yet approved or cleared by the [place] FDA and  has been authorized for detection and/or diagnosis of SARS-CoV-2 by FDA under an Emergency Use Authorization (EUA). This EUA will remain  in effect (meaning this test can be used) for the duration of the COVID-19 declaration under Section 564(b)(1) of the Act, 21 U.S.C.section 360bbb-3(b)(1), unless the authorization is terminated  or revoked sooner.       Influenza A by PCR NEGATIVE NEGATIVE Final   Influenza B by PCR NEGATIVE NEGATIVE Final    Comment: (NOTE) The Xpert Xpress SARS-CoV-2/FLU/RSV plus assay is intended as an aid in the diagnosis of influenza from Nasopharyngeal swab specimens and should not be used as a sole basis for treatment. Nasal washings and aspirates are unacceptable for Xpert Xpress SARS-CoV-2/FLU/RSV testing.  Fact Sheet for Patients: BloggerCourse.com  Fact Sheet for Healthcare Providers: SeriousBroker.it  This test is not yet approved or cleared by the [place] FDA and has  been authorized for detection and/or diagnosis of SARS-CoV-2 by FDA under an Emergency Use Authorization (EUA). This EUA will remain in effect (meaning this test can be used) for the duration of the COVID-19 declaration under Section 564(b)(1) of the Act, 21 U.S.C. section 360bbb-3(b)(1), unless the authorization is terminated or revoked.  Performed at [facility], [address] [place], [place] [address]      Radiology Studies: CARDIAC CATHETERIZATION  Result Date: 03/13/2020 Conclusions: 1. No angiographically significant coronary artery disease. 2. Severely elevated left ventricular filling pressure (LVEFD ~40 mmHg at end-expiration) with marked respiratory variation. 3. Grossly normal left ventricular contraction. Recommendations: 1. Escalate diuresis for treatment of acute HFpEF. 2. Consider workup for  other etiologies of chest pain per hospitalist service if chest pain persists. 3. Primary prevention of coronary artery disease. Yvonne Kendall, MD [facility]   ECHOCARDIOGRAM COMPLETE  Result Date: 03/12/2020    ECHOCARDIOGRAM REPORT   Patient Name:   Rhonda Palmer Date of Exam: 03/12/2020 Medical Rec #:  [id number]        Height:       61.0 in Accession #:    [id number]       Weight:       351.8 lb Date of Birth:  Nov 12, 1970        BSA:          2.401 m Patient Age:    49 years         BP:           112/79 mmHg Patient Gender: F                HR:           77 bpm. Exam Location:  [facility] Procedure: 2D Echo, Color Doppler, Cardiac Doppler and Intracardiac            Opacification Agent Indications:     R07.9 Chest Pain  History:         Patient has no prior history of Echocardiogram examinations.                  Risk Factors:Hypertension and Diabetes.  Sonographer:     Humphrey Rolls RDCS (AE) Referring Phys:  [id number] Sondra Barges Diagnosing Phys: Lorine Bears MD  Sonographer Comments: Technically difficult study due to poor echo windows. Image acquisition challenging due to  patient body habitus. IMPRESSIONS  1. Left ventricular ejection fraction, by estimation, is 55 to 60%. The left ventricle has normal function. The left ventricle has no regional wall motion abnormalities. Left ventricular diastolic parameters were normal.  2. Right ventricular systolic function is normal. The right ventricular size is normal. Tricuspid regurgitation signal is inadequate for assessing PA pressure.  3. The mitral valve is normal in structure. No evidence of mitral valve regurgitation. No evidence of mitral stenosis.  4. The aortic valve is normal in structure. Aortic valve regurgitation is not visualized. No aortic stenosis is present. FINDINGS  Left Ventricle: Left ventricular ejection fraction, by estimation, is 55 to 60%. The left ventricle has normal function. The left ventricle has no regional wall motion abnormalities. Definity contrast agent was given IV to delineate the left ventricular  endocardial borders. The left ventricular internal cavity size was normal in size. There is no left ventricular hypertrophy. Left ventricular diastolic parameters were normal. Right Ventricle: The right ventricular size is normal. No increase in right ventricular wall thickness. Right ventricular systolic function is normal. Tricuspid regurgitation signal is inadequate for assessing PA pressure. Left Atrium: Left atrial size was normal in size. Right Atrium: Right atrial size was normal in size. Pericardium: There is no evidence of pericardial effusion. Mitral Valve: The mitral valve is normal in structure. No evidence of mitral valve regurgitation. No evidence of mitral valve stenosis. MV peak gradient, 3.1 mmHg. The mean mitral valve gradient is 1.0 mmHg. Tricuspid Valve: The tricuspid valve is normal in structure. Tricuspid valve regurgitation is not demonstrated. No evidence of tricuspid stenosis. Aortic Valve: The aortic valve is normal in structure. Aortic valve regurgitation is not visualized. No aortic  stenosis is present. Aortic valve mean gradient measures 2.0 mmHg. Aortic valve peak gradient measures 4.2 mmHg. Aortic valve area,  by VTI measures 3.39 cm. Pulmonic Valve: The pulmonic valve was normal in structure. Pulmonic valve regurgitation is not visualized. No evidence of pulmonic stenosis. Aorta: The aortic root is normal in size and structure. Venous: The inferior vena cava was not well visualized. IAS/Shunts: No atrial level shunt detected by color flow Doppler.  LEFT VENTRICLE PLAX 2D LVIDd:         4.60 cm  Diastology LVIDs:         3.40 cm  LV e' medial:    9.14 cm/s LV PW:         0.90 cm  LV E/e' medial:  7.9 LV IVS:        1.10 cm  LV e' lateral:   7.51 cm/s LVOT diam:     2.20 cm  LV E/e' lateral: 9.6 LV SV:         54 LV SV Index:   22 LVOT Area:     3.80 cm  LEFT ATRIUM         Index LA diam:    3.50 cm 1.46 cm/m  AORTIC VALVE                   PULMONIC VALVE AV Area (Vmax):    3.32 cm    PV Vmax:       1.29 m/s AV Area (Vmean):   3.35 cm    PV Vmean:      90.300 cm/s AV Area (VTI):     3.39 cm    PV VTI:        0.199 m AV Vmax:           103.00 cm/s PV Peak grad:  6.7 mmHg AV Vmean:          72.100 cm/s PV Mean grad:  4.0 mmHg AV VTI:            0.159 m AV Peak Grad:      4.2 mmHg AV Mean Grad:      2.0 mmHg LVOT Vmax:         90.00 cm/s LVOT Vmean:        63.500 cm/s LVOT VTI:          0.142 m LVOT/AV VTI ratio: 0.89  AORTA Ao Root diam: 3.00 cm MITRAL VALVE MV Area (PHT): 3.60 cm    SHUNTS MV Area VTI:   2.58 cm    Systemic VTI:  0.14 m MV Peak grad:  3.1 mmHg    Systemic Diam: 2.20 cm MV Mean grad:  1.0 mmHg MV Vmax:       0.89 m/s MV Vmean:      52.0 cm/s MV Decel Time: 211 msec MV E velocity: 72.00 cm/s MV A velocity: 62.10 cm/s MV E/A ratio:  1.16 Lorine Bears MD Electronically signed by Lorine Bears MD Signature Date/Time: 03/12/2020/12:31:31 PM    Final     Scheduled Meds: . atorvastatin  80 mg Oral QHS  . clonazePAM  0.5 mg Oral TID  . enoxaparin (LOVENOX) injection  0.5  mg/kg Subcutaneous Q24H  . furosemide  40 mg Intravenous BID  . insulin aspart  0-5 Units Subcutaneous QHS  . insulin aspart  0-9 Units Subcutaneous TID WC  . levothyroxine  225 mcg Oral Daily  . losartan  100 mg Oral Daily  . ondansetron      . pantoprazole  40 mg Oral Daily  . propranolol  20 mg Oral TID  . QUEtiapine  600 mg  Oral QHS  . sodium chloride flush  3 mL Intravenous Q12H   Continuous Infusions: . sodium chloride       LOS: 2 days   Time spent: 30 minutes.  Arnetha Courser, MD [facility]  If 7PM-7AM, please contact night-coverage [email or web address]  03/13/2020, 5:43 PM   This record has been created using Conservation officer, historic buildings. Errors have been sought and corrected,but may not always be located. Such creation errors do not reflect on the standard of care.

## 2020-03-14 ENCOUNTER — Encounter: Payer: Self-pay | Admitting: Internal Medicine

## 2020-03-14 DIAGNOSIS — E7800 Pure hypercholesterolemia, unspecified: Secondary | ICD-10-CM

## 2020-03-14 DIAGNOSIS — R072 Precordial pain: Secondary | ICD-10-CM

## 2020-03-14 LAB — BASIC METABOLIC PANEL
Anion gap: 12 (ref 5–15)
BUN: 22 mg/dL — ABNORMAL HIGH (ref 6–20)
CO2: 32 mmol/L (ref 22–32)
Calcium: 8.6 mg/dL — ABNORMAL LOW (ref 8.9–10.3)
Chloride: 93 mmol/L — ABNORMAL LOW (ref 98–111)
Creatinine, Ser: 0.66 mg/dL (ref 0.44–1.00)
GFR, Estimated: >60 mL/min (ref >60)
Glucose, Bld: 113 mg/dL — ABNORMAL HIGH (ref 70–99)
Potassium: 4 mmol/L (ref 3.5–5.1)
Sodium: 137 mmol/L (ref 135–145)

## 2020-03-14 LAB — GLUCOSE, CAPILLARY
Glucose-Capillary: 116 mg/dL — ABNORMAL HIGH (ref 70–99)
Glucose-Capillary: 111 mg/dL — ABNORMAL HIGH (ref 70–99)
Glucose-Capillary: 115 mg/dL — ABNORMAL HIGH (ref 70–99)
Glucose-Capillary: 131 mg/dL — ABNORMAL HIGH (ref 70–99)
Glucose-Capillary: 116 mg/dL — ABNORMAL HIGH (ref 70–99)
Glucose-Capillary: 132 mg/dL — ABNORMAL HIGH (ref 70–99)

## 2020-03-14 MED ORDER — TORSEMIDE 20 MG PO TABS
40.0000 mg | ORAL_TABLET | Freq: Every day | ORAL | Status: DC
  Administered 2020-03-14 – 2020-03-15 (×2): 40 mg via ORAL
  Filled 2020-03-14 (×2): qty 2

## 2020-03-14 NOTE — Progress Notes (Addendum)
Progress Note  Patient Name: Rhonda Palmer Date of Encounter: 03/14/2020  [facility] Cardiologist: Agbor-Etang  Subjective   LHC showed no significant CAD, but did show elevated LVEDP and started on IV lasix. Overnight UOP -3.2L. Patient still on 2L O2 and feels breathing is the same. Reports she is still experiencing chest pressure.   Inpatient Medications    Scheduled Meds: . atorvastatin  80 mg Oral QHS  . clonazePAM  0.5 mg Oral TID  . enoxaparin (LOVENOX) injection  0.5 mg/kg Subcutaneous Q24H  . furosemide  40 mg Intravenous BID  . insulin aspart  0-5 Units Subcutaneous QHS  . insulin aspart  0-9 Units Subcutaneous TID WC  . levothyroxine  225 mcg Oral Daily  . losartan  100 mg Oral Daily  . pantoprazole  40 mg Oral Daily  . propranolol  20 mg Oral TID  . QUEtiapine  600 mg Oral QHS  . sodium chloride flush  3 mL Intravenous Q12H   Continuous Infusions: . sodium chloride     PRN Meds: sodium chloride, acetaminophen, albuterol, dextromethorphan-guaiFENesin, hydrALAZINE, ibuprofen, nitroGLYCERIN, ondansetron (ZOFRAN) IV, sodium chloride flush, traMADol, zolpidem   Vital Signs    Vitals:   03/14/20 0140 03/14/20 0215 03/14/20 0457 03/14/20 0732  BP:  117/73 106/81 118/80  Pulse:  77 72 74  Resp:  18 18   Temp: 99.2 F (37.3 C)  98.6 F (37 C) 98.8 F (37.1 C)  TempSrc: Oral  Oral Oral  SpO2: 94% 94% 90% 94%  Weight:  (!) 162.6 kg    Height:        Intake/Output Summary (Last 24 hours) at 03/14/2020 0830 Last data filed at 03/14/2020 0221 Gross per 24 hour  Intake 392.32 ml  Output 3200 ml  Net -2807.68 ml   Last 3 Weights 03/14/2020 03/13/2020 03/12/2020  Weight (lbs) 358 lb 7.5 oz 257 lb 3.2 oz 351 lb 12.8 oz  Weight (kg) 162.6 kg 116.665 kg 159.575 kg      Telemetry    NSR, HR 70s - Personally Reviewed  ECG    No new - Personally Reviewed  Physical Exam   GEN: No acute distress.   Neck: No JVD Cardiac: RRR, no murmurs, rubs, or  gallops.  Respiratory: Clear to auscultation bilaterally. GI: Soft, nontender, non-distended  MS: No edema; No deformity. Neuro:  Nonfocal  Psych: Normal affect   Labs    High Sensitivity Troponin:   Recent Labs  Lab 02/22/20 1309 03/03/20 1043 03/11/20 0803 03/11/20 0951 03/11/20 1725  TROPONINIHS 5 4 3 3 4       Chemistry Recent Labs  Lab 03/11/20 0803  NA 138  K 4.9  CL 100  CO2 27  GLUCOSE 128*  BUN 22*  CREATININE 0.66  CALCIUM 9.0  GFRNONAA >60  ANIONGAP 11     Hematology Recent Labs  Lab 03/11/20 0803 03/12/20 0431  WBC 9.6 7.2  RBC 3.92 3.98  HGB 10.9* 11.3*  HCT 36.9 37.3  MCV 94.1 93.7  MCH 27.8 28.4  MCHC 29.5* 30.3  RDW 17.2* 17.2*  PLT 307 313    BNP Recent Labs  Lab 03/11/20 0803  BNP 30.7     DDimer No results for input(s): DDIMER in the last 168 hours.   Radiology    CARDIAC CATHETERIZATION  Result Date: 03/13/2020 Conclusions: 1. No angiographically significant coronary artery disease. 2. Severely elevated left ventricular filling pressure (LVEFD ~40 mmHg at end-expiration) with marked respiratory variation. 3. Grossly normal  left ventricular contraction. Recommendations: 1. Escalate diuresis for treatment of acute HFpEF. 2. Consider workup for other etiologies of chest pain per hospitalist service if chest pain persists. 3. Primary prevention of coronary artery disease. Yvonne Kendall, MD [facility]   ECHOCARDIOGRAM COMPLETE  Result Date: 03/12/2020    ECHOCARDIOGRAM REPORT   Patient Name:   Rhonda Palmer Date of Exam: 03/12/2020 Medical Rec #:  [id number]        Height:       61.0 in Accession #:    [id number]       Weight:       351.8 lb Date of Birth:  Jun 10, 1970        BSA:          2.401 m Patient Age:    49 years         BP:           112/79 mmHg Patient Gender: F                HR:           77 bpm. Exam Location:  [facility] Procedure: 2D Echo, Color Doppler, Cardiac Doppler and Intracardiac            Opacification Agent  Indications:     R07.9 Chest Pain  History:         Patient has no prior history of Echocardiogram examinations.                  Risk Factors:Hypertension and Diabetes.  Sonographer:     Humphrey Rolls RDCS (AE) Referring Phys:  [id number] Sondra Barges Diagnosing Phys: Lorine Bears MD  Sonographer Comments: Technically difficult study due to poor echo windows. Image acquisition challenging due to patient body habitus. IMPRESSIONS  1. Left ventricular ejection fraction, by estimation, is 55 to 60%. The left ventricle has normal function. The left ventricle has no regional wall motion abnormalities. Left ventricular diastolic parameters were normal.  2. Right ventricular systolic function is normal. The right ventricular size is normal. Tricuspid regurgitation signal is inadequate for assessing PA pressure.  3. The mitral valve is normal in structure. No evidence of mitral valve regurgitation. No evidence of mitral stenosis.  4. The aortic valve is normal in structure. Aortic valve regurgitation is not visualized. No aortic stenosis is present. FINDINGS  Left Ventricle: Left ventricular ejection fraction, by estimation, is 55 to 60%. The left ventricle has normal function. The left ventricle has no regional wall motion abnormalities. Definity contrast agent was given IV to delineate the left ventricular  endocardial borders. The left ventricular internal cavity size was normal in size. There is no left ventricular hypertrophy. Left ventricular diastolic parameters were normal. Right Ventricle: The right ventricular size is normal. No increase in right ventricular wall thickness. Right ventricular systolic function is normal. Tricuspid regurgitation signal is inadequate for assessing PA pressure. Left Atrium: Left atrial size was normal in size. Right Atrium: Right atrial size was normal in size. Pericardium: There is no evidence of pericardial effusion. Mitral Valve: The mitral valve is normal in structure. No evidence of  mitral valve regurgitation. No evidence of mitral valve stenosis. MV peak gradient, 3.1 mmHg. The mean mitral valve gradient is 1.0 mmHg. Tricuspid Valve: The tricuspid valve is normal in structure. Tricuspid valve regurgitation is not demonstrated. No evidence of tricuspid stenosis. Aortic Valve: The aortic valve is normal in structure. Aortic valve regurgitation is not visualized. No aortic stenosis is present. Aortic  valve mean gradient measures 2.0 mmHg. Aortic valve peak gradient measures 4.2 mmHg. Aortic valve area, by VTI measures 3.39 cm. Pulmonic Valve: The pulmonic valve was normal in structure. Pulmonic valve regurgitation is not visualized. No evidence of pulmonic stenosis. Aorta: The aortic root is normal in size and structure. Venous: The inferior vena cava was not well visualized. IAS/Shunts: No atrial level shunt detected by color flow Doppler.  LEFT VENTRICLE PLAX 2D LVIDd:         4.60 cm  Diastology LVIDs:         3.40 cm  LV e' medial:    9.14 cm/s LV PW:         0.90 cm  LV E/e' medial:  7.9 LV IVS:        1.10 cm  LV e' lateral:   7.51 cm/s LVOT diam:     2.20 cm  LV E/e' lateral: 9.6 LV SV:         54 LV SV Index:   22 LVOT Area:     3.80 cm  LEFT ATRIUM         Index LA diam:    3.50 cm 1.46 cm/m  AORTIC VALVE                   PULMONIC VALVE AV Area (Vmax):    3.32 cm    PV Vmax:       1.29 m/s AV Area (Vmean):   3.35 cm    PV Vmean:      90.300 cm/s AV Area (VTI):     3.39 cm    PV VTI:        0.199 m AV Vmax:           103.00 cm/s PV Peak grad:  6.7 mmHg AV Vmean:          72.100 cm/s PV Mean grad:  4.0 mmHg AV VTI:            0.159 m AV Peak Grad:      4.2 mmHg AV Mean Grad:      2.0 mmHg LVOT Vmax:         90.00 cm/s LVOT Vmean:        63.500 cm/s LVOT VTI:          0.142 m LVOT/AV VTI ratio: 0.89  AORTA Ao Root diam: 3.00 cm MITRAL VALVE MV Area (PHT): 3.60 cm    SHUNTS MV Area VTI:   2.58 cm    Systemic VTI:  0.14 m MV Peak grad:  3.1 mmHg    Systemic Diam: 2.20 cm MV Mean  grad:  1.0 mmHg MV Vmax:       0.89 m/s MV Vmean:      52.0 cm/s MV Decel Time: 211 msec MV E velocity: 72.00 cm/s MV A velocity: 62.10 cm/s MV E/A ratio:  1.16 Lorine Bears MD Electronically signed by Lorine Bears MD Signature Date/Time: 03/12/2020/12:31:31 PM    Final     Cardiac Studies   Echo 03/12/20 1. Left ventricular ejection fraction, by estimation, is 55 to 60%. The  left ventricle has normal function. The left ventricle has no regional  wall motion abnormalities. Left ventricular diastolic parameters were  normal.  2. Right ventricular systolic function is normal. The right ventricular  size is normal. Tricuspid regurgitation signal is inadequate for assessing  PA pressure.  3. The mitral valve is normal in structure. No evidence of mitral valve  regurgitation. No  evidence of mitral stenosis.  4. The aortic valve is normal in structure. Aortic valve regurgitation is  not visualized. No aortic stenosis is present.    LHC 03/13/20 Conclusions: 1. No angiographically significant coronary artery disease. 2. Severely elevated left ventricular filling pressure (LVEFD ~40 mmHg at end-expiration) with marked respiratory variation. 3. Grossly normal left ventricular contraction.  Recommendations: 1. Escalate diuresis for treatment of acute HFpEF. 2. Consider workup for other etiologies of chest pain per hospitalist service if chest pain persists. 3. Primary prevention of coronary artery disease.  Yvonne Kendall, MD [facility]    Patient Profile     50 y.o. 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 pregnancy, lymphedema, prior BZD and opiate abuse, and obesity who is being seen for chest pain.  Assessment & Plan    Atypical chest pain - HS trop negative x 5 and EKG w/o ischemic changes - LHC showed no obstructive CAD although showed LVEDP was  severely elevated - Echo showed normal LVEF - continue BB - She still reports chest pressure, which might be from volume overload - cath site, right radial, is stable  Acute HFpEF - LVEDP elevated on cath - torsemide transitioned to IV lasix 40mg  BID - Good UOP overnight, net -3.6L - unsure if weights are accurate  - continue propranolol and losartan - BMET pending - difficult to assess volume status due to body habitus.  HTN  - BP good - continue losartan, propranolol - IV lasix  Respiratory failure - suspected OHS - on supplemental O2 ay 2L - needs OP sleep study  For questions or updates, please contact [facility] Please consult [email or web address] for contact info under        Signed, Cristle Jared , PA-C  03/14/2020, 8:30 AM

## 2020-03-14 NOTE — Progress Notes (Signed)
[facility] PROGRESS NOTE    Dianey Suchy  HUD:[id number] DOB: 18-Apr-1970 DOA: 03/11/2020 PCP: Grayland Jack, FNP      Brief Narrative:  Rhonda Palmer is a 50 y.o. F with morbid obesity BMI >60, schizoaffective disorder/Bipolar, HTN, asthma, DM, hypothyroidism and hx VTE no longer on [facility] who presented with intermittent chest pain now worsening for 2 weeks.  Evidently, these chest pains had been progressive, intermittent, and non-exertional.  She was being worked up as an outpatient by Cardiology, but her symptoms worsened and she came to the ER.  The pains were intermittent, non-exertional, not improved with NTG, aspirin or ibuprofen.  Worse with lying flat.    In the ER, slightly hypoxic.  Recent CTA in context of these symptoms was limited by habitus but no central PE.    2/13 Admitted 2/15 LHC for concern of unstable angina --> no coronary disease but did show severely elevated LVEDP and respiratory variation consistent with OSA     Assessment & Plan:  Acute on chronic diastolic CHF Hypertension Exam confounded by habitus.  CHF had to be diagnosed by LHC.  Net -2.8 L yesterday, -3.6 L on admission  Cr and K stable -Continue furosemide IV -Daily weights, strict I/Os, daily BMP -Continue losartan -Continue propranolol  -PSG as outpatient    ACS ruled out No significant coronary disease, Cardiology recommend primary prevention of CAD.  Diabetes Glucoses normal -Continue SS correction insulin -Continue atorvastatin  Hypothyroidism -Continue levothyroxine  Schizoaffective disorder bipolar disorder -Continue Seroquel, clonazepam  Morbid obesity BMI 60 -Continue Protonix  Mild intermittent asthma No evidence of flare  Normocytic anemia Hgb stable, no clinical bleeding         Disposition: Status is: Inpatient  Remains inpatient appropriate because:IV treatments appropriate due to intensity of illness or inability to take  PO   Dispo: The patient is from: Home              Anticipated d/c is to: Home              Anticipated d/c date is: > 3 days              Patient currently is not medically stable to d/c.   Difficult to place patient No       Level of care: Progressive Cardiac       MDM: The below labs and imaging reports were reviewed and summarized above.  Medication management as above.    DVT prophylaxis: Lovenox  Code Status: FULL Family Communication:     Consultants:   Cardiology  Procedures:   2/14 echo -- EF 50-55%, normal valves  2/15 LHC -- no obstructive disease, LVEDP very elevated           Subjective: Out of breath, still orthopneic.  Still has chest heaviness.  No confusion, vomiting, diarrhea.  Objective: Vitals:   03/14/20 0457 03/14/20 0732 03/14/20 1153 03/14/20 1616  BP: 106/81 118/80 (!) 102/51 100/68  Pulse: 72 74 66 70  Resp: 18  16 15   Temp: 98.6 F (37 C) 98.8 F (37.1 C) 98.7 F (37.1 C) 98.2 F (36.8 C)  TempSrc: Oral Oral Oral Oral  SpO2: 90% 94% 92% 90%  Weight:      Height:        Intake/Output Summary (Last 24 hours) at 03/14/2020 1756 Last data filed at 03/14/2020 0221 Gross per 24 hour  Intake --  Output 1350 ml  Net -1350 ml  Filed Weights   03/12/20 0018 03/13/20 0338 03/14/20 0215  Weight: (!) 159.6 kg 116.7 kg (!) 162.6 kg    Examination: General appearance: Morbidly obese adult female, alert and in no obvious distress.  Sitting up in bed. HEENT: Anicteric, conjunctiva pink, lids and lashes normal. No nasal deformity, discharge, epistaxis.  Lips moist oropharynx moist, no oral lesions, hearing.   Skin: Warm and dry.  No jaundice.  No suspicious rashes or lesions. Cardiac: Heart sounds distant, JVP not visible due to habitus, legs large, but no pitting edema in [place]. Respiratory: Normal respiratory rate and rhythm.  Lung sounds not appreciable due to body habitus Abdomen: Abdomen soft.  No TTP or  guarding. No ascites, distension, hepatosplenomegaly.   MSK: No deformities or effusions. Neuro: Awake and alert.  EOMI, moves all extremities with generalized weakness. Speech fluent.    Psych: Sensorium intact and responding to questions, attention normal. Affect blunted.  Judgment and insight appear normal.    Data Reviewed: I have personally reviewed following labs and imaging studies:  CBC: Recent Labs  Lab 03/11/20 0803 03/12/20 0431  WBC 9.6 7.2  HGB 10.9* 11.3*  HCT 36.9 37.3  MCV 94.1 93.7  PLT 307 313   Basic Metabolic Panel: Recent Labs  Lab 03/11/20 0803 03/12/20 0431 03/14/20 0909  NA 138  --  137  K 4.9  --  4.0  CL 100  --  93*  CO2 27  --  32  GLUCOSE 128*  --  113*  BUN 22*  --  22*  CREATININE 0.66  --  0.66  CALCIUM 9.0  --  8.6*  MG  --  2.3  --    GFR: Estimated Creatinine Clearance: 123.1 mL/min (by C-G formula based on SCr of 0.66 mg/dL). Liver Function Tests: No results for input(s): AST, ALT, ALKPHOS, BILITOT, PROT, ALBUMIN in the last 168 hours. No results for input(s): LIPASE, AMYLASE in the last 168 hours. No results for input(s): AMMONIA in the last 168 hours. Coagulation Profile: No results for input(s): INR, PROTIME in the last 168 hours. Cardiac Enzymes: No results for input(s): CKTOTAL, CKMB, CKMBINDEX, TROPONINI in the last 168 hours. BNP (last 3 results) No results for input(s): PROBNP in the last 8760 hours. HbA1C: Recent Labs    03/12/20 0431  HGBA1C 6.4*   CBG: Recent Labs  Lab 03/13/20 2054 03/14/20 0733 03/14/20 1143 03/14/20 1149 03/14/20 1616  GLUCAP 116* 111* 131* 115* 116*   Lipid Profile: Recent Labs    03/12/20 0431  CHOL 255*  HDL 73  LDLCALC 147*  TRIG 177*  CHOLHDL 3.5   Thyroid Function Tests: No results for input(s): TSH, T4TOTAL, FREET4, T3FREE, THYROIDAB in the last 72 hours. Anemia Panel: No results for input(s): VITAMINB12, FOLATE, FERRITIN, TIBC, IRON, RETICCTPCT in the last 72  hours. Urine analysis:    Component Value Date/Time   COLORURINE YELLOW (A) 01/23/2019 0853   APPEARANCEUR CLOUDY (A) 01/23/2019 0853   APPEARANCEUR Clear 04/29/2017 0918   LABSPEC 1.023 01/23/2019 0853   PHURINE 6.0 01/23/2019 0853   GLUCOSEU NEGATIVE 01/23/2019 0853   HGBUR NEGATIVE 01/23/2019 0853   BILIRUBINUR NEGATIVE 01/23/2019 0853   BILIRUBINUR Negative 04/29/2017 0918   KETONESUR NEGATIVE 01/23/2019 0853   PROTEINUR NEGATIVE 01/23/2019 0853   NITRITE NEGATIVE 01/23/2019 0853   LEUKOCYTESUR TRACE (A) 01/23/2019 0853   Sepsis Labs: @LABRCNTIP (procalcitonin:4,lacticacidven:4)  ) Recent Results (from the past 240 hour(s))  Resp Panel by RT-PCR (Flu A&B, Covid) Nasopharyngeal Swab  Status: None   Collection Time: 03/11/20  8:46 AM   Specimen: Nasopharyngeal Swab; Nasopharyngeal(NP) swabs in vial transport medium  Result Value Ref Range Status   SARS Coronavirus 2 by RT PCR NEGATIVE NEGATIVE Final    Comment: (NOTE) SARS-CoV-2 target nucleic acids are NOT DETECTED.  The SARS-CoV-2 RNA is generally detectable in upper respiratory specimens during the acute phase of infection. The lowest concentration of SARS-CoV-2 viral copies this assay can detect is 138 copies/mL. A negative result does not preclude SARS-Cov-2 infection and should not be used as the sole basis for treatment or other patient management decisions. A negative result may occur with  improper specimen collection/handling, submission of specimen other than nasopharyngeal swab, presence of viral mutation(s) within the areas targeted by this assay, and inadequate number of viral copies(<138 copies/mL). A negative result must be combined with clinical observations, patient history, and epidemiological information. The expected result is Negative.  Fact Sheet for Patients:  BloggerCourse.com  Fact Sheet for Healthcare Providers:  SeriousBroker.it  This  test is no t yet approved or cleared by the [place] FDA and  has been authorized for detection and/or diagnosis of SARS-CoV-2 by FDA under an Emergency Use Authorization (EUA). This EUA will remain  in effect (meaning this test can be used) for the duration of the COVID-19 declaration under Section 564(b)(1) of the Act, 21 U.S.C.section 360bbb-3(b)(1), unless the authorization is terminated  or revoked sooner.       Influenza A by PCR NEGATIVE NEGATIVE Final   Influenza B by PCR NEGATIVE NEGATIVE Final    Comment: (NOTE) The Xpert Xpress SARS-CoV-2/FLU/RSV plus assay is intended as an aid in the diagnosis of influenza from Nasopharyngeal swab specimens and should not be used as a sole basis for treatment. Nasal washings and aspirates are unacceptable for Xpert Xpress SARS-CoV-2/FLU/RSV testing.  Fact Sheet for Patients: BloggerCourse.com  Fact Sheet for Healthcare Providers: SeriousBroker.it  This test is not yet approved or cleared by the [place] FDA and has been authorized for detection and/or diagnosis of SARS-CoV-2 by FDA under an Emergency Use Authorization (EUA). This EUA will remain in effect (meaning this test can be used) for the duration of the COVID-19 declaration under Section 564(b)(1) of the Act, 21 U.S.C. section 360bbb-3(b)(1), unless the authorization is terminated or revoked.  Performed at [facility], [address], [place], [place] [address]          Radiology Studies: CARDIAC CATHETERIZATION  Result Date: 03/13/2020 Conclusions: 1. No angiographically significant coronary artery disease. 2. Severely elevated left ventricular filling pressure (LVEFD ~40 mmHg at end-expiration) with marked respiratory variation. 3. Grossly normal left ventricular contraction. Recommendations: 1. Escalate diuresis for treatment of acute HFpEF. 2. Consider workup for other etiologies of chest pain  per hospitalist service if chest pain persists. 3. Primary prevention of coronary artery disease. Yvonne Kendall, MD [facility]        Scheduled Meds: . atorvastatin  80 mg Oral QHS  . clonazePAM  0.5 mg Oral TID  . enoxaparin (LOVENOX) injection  0.5 mg/kg Subcutaneous Q24H  . insulin aspart  0-5 Units Subcutaneous QHS  . insulin aspart  0-9 Units Subcutaneous TID WC  . levothyroxine  225 mcg Oral Daily  . losartan  100 mg Oral Daily  . pantoprazole  40 mg Oral Daily  . propranolol  20 mg Oral TID  . QUEtiapine  600 mg Oral QHS  . sodium chloride flush  3 mL Intravenous Q12H  .  torsemide  40 mg Oral Daily   Continuous Infusions: . sodium chloride       LOS: 3 days    Time spent: 25 minutes    Alberteen Samhristopher P Thelia Tanksley, MD [facility] 03/14/2020, 5:56 PM     Please page though AMION or Epic secure chat:  For [employer] Holdings Corporation[employer]ion password, contact charge nurse     Estimated body mass index is 67.73 kg/m as calculated from the following:   Height as of this encounter: 5\' 1"  (1.549 m).   Weight as of this encounter: 162.6 kg. Malnutrition Type:   Malnutrition Characteristics:   Nutrition Interventions:      . atorvastatin  80 mg Oral QHS  . clonazePAM  0.5 mg Oral TID  . enoxaparin (LOVENOX) injection  0.5 mg/kg Subcutaneous Q24H  . insulin aspart  0-5 Units Subcutaneous QHS  . insulin aspart  0-9 Units Subcutaneous TID WC  . levothyroxine  225 mcg Oral Daily  . losartan  100 mg Oral Daily  . pantoprazole  40 mg Oral Daily  . propranolol  20 mg Oral TID  . QUEtiapine  600 mg Oral QHS  . sodium chloride flush  3 mL Intravenous Q12H  . torsemide  40 mg Oral Daily   . sodium chloride      sodium chloride, acetaminophen, albuterol, dextromethorphan-guaiFENesin, hydrALAZINE, ibuprofen, nitroGLYCERIN, ondansetron (ZOFRAN) IV, sodium chloride flush, traMADol, zolpidem   Current Meds  Medication Sig  . atorvastatin (LIPITOR) 80 MG tablet Take 80 mg by mouth at  bedtime.  . clonazePAM (KLONOPIN) 0.5 MG tablet Take 0.5 mg by mouth 3 (three) times daily.   Marland Kitchen. glimepiride (AMARYL) 4 MG tablet Take 4 mg by mouth daily.  . IBU 800 MG tablet Take 800 mg by mouth 2 (two) times daily as needed.  . Levothyroxine Sodium 200 MCG CAPS Take 200 mcg by mouth daily.  Marland Kitchen. losartan (COZAAR) 100 MG tablet Take 100 mg by mouth daily.  . metFORMIN (GLUCOPHAGE) 500 MG tablet Take 500 mg by mouth 2 (two) times daily with a meal.  . nitroGLYCERIN (NITROSTAT) 0.4 MG SL tablet Place 1 tablet (0.4 mg total) under the tongue every 5 (five) minutes as needed for chest pain.  . pantoprazole (PROTONIX) 40 MG tablet Take 40 mg by mouth daily.  . propranolol (INDERAL) 20 MG tablet Take 20 mg by mouth 3 (three) times daily.  . QUEtiapine (SEROQUEL) 300 MG tablet Take 600 mg by mouth at bedtime.  . torsemide (DEMADEX) 20 MG tablet Take 20 mg by mouth daily.  Marland Kitchen. zolpidem (AMBIEN) 5 MG tablet Take 5 mg by mouth at bedtime as needed.

## 2020-03-14 NOTE — Consult Note (Addendum)
   Heart Failure Nurse Navigator Note  HFpEF of 55 to 60%.  Left ventricle has normal function.  No left ventricular regional wall motion abnormalities noted.  Right ventricular systolic size is normal with normal right ventricular systolic function.  She presented to the emergency room with complaints of chest discomfort.  She underwent left heart catheterization which revealed severely elevated left ventricular filling pressures of 41 mmHg.  Comorbidities:  Morbid obesity Hypertension Hyperlipidemia Diabetes Anemia  Medications:  Atorvastatin 80 mg daily Losartan 100 mg daily  Inderal 20 mg 3 times a day Torsemide 40 mg daily  Labs:  Sodium 137, potassium 4, chloride 93, CO2 32, BUN 22, creatinine 0.66, hemoglobin A1c 6.4, total cholesterol 255, triglycerides 177, H DL 73, LDL 480.   Assessment:  General-she is awake and alert lying in bed in no acute distress.  HEENT-pupils are equal, unable to evaluate for JVD due to body habitus.  Cardiac- distant heart tones of regular rate and rhythm  Chest-breath sounds are clear anteriorly.  Abdomen obese soft nontender.  Lower extremities pedal pulses are palpable no pitting edema noted.   Psych-is pleasant and appropriate makes good eye contact.  Neurologic-speech is clear she moves all extremities.      Initial visit with patient.  Discussed the lifestyle changes that she needs to make, and what she eats and drinks.  Discussed using no salt.  She states as meals go they eat Hamburger helper, tacos and order from restaurants for delivery.   Also talked about getting involved with the outpatient heart failure clinic.  Also discussed cardiac rehab.  She was given heart failure teaching booklet.  Tresa Endo RN, CHFN

## 2020-03-15 DIAGNOSIS — R072 Precordial pain: Secondary | ICD-10-CM | POA: Diagnosis not present

## 2020-03-15 LAB — CBC
HCT: 37.1 % (ref 36.0–46.0)
Hemoglobin: 11.4 g/dL — ABNORMAL LOW (ref 12.0–15.0)
MCH: 27.9 pg (ref 26.0–34.0)
MCHC: 30.7 g/dL (ref 30.0–36.0)
MCV: 90.9 fL (ref 80.0–100.0)
Platelets: 243 10*3/uL (ref 150–400)
RBC: 4.08 MIL/uL (ref 3.87–5.11)
RDW: 17 % — ABNORMAL HIGH (ref 11.5–15.5)
WBC: 10.3 10*3/uL (ref 4.0–10.5)
nRBC: 0 % (ref 0.0–0.2)

## 2020-03-15 LAB — BASIC METABOLIC PANEL
Anion gap: 13 (ref 5–15)
BUN: 23 mg/dL — ABNORMAL HIGH (ref 6–20)
CO2: 33 mmol/L — ABNORMAL HIGH (ref 22–32)
Calcium: 8.5 mg/dL — ABNORMAL LOW (ref 8.9–10.3)
Chloride: 90 mmol/L — ABNORMAL LOW (ref 98–111)
Creatinine, Ser: 0.77 mg/dL (ref 0.44–1.00)
GFR, Estimated: >60 mL/min (ref >60)
Glucose, Bld: 109 mg/dL — ABNORMAL HIGH (ref 70–99)
Potassium: 5.2 mmol/L — ABNORMAL HIGH (ref 3.5–5.1)
Sodium: 136 mmol/L (ref 135–145)

## 2020-03-15 LAB — GLUCOSE, CAPILLARY
Glucose-Capillary: 106 mg/dL — ABNORMAL HIGH (ref 70–99)
Glucose-Capillary: 166 mg/dL — ABNORMAL HIGH (ref 70–99)
Glucose-Capillary: 123 mg/dL — ABNORMAL HIGH (ref 70–99)
Glucose-Capillary: 113 mg/dL — ABNORMAL HIGH (ref 70–99)

## 2020-03-15 MED ORDER — FUROSEMIDE 10 MG/ML IJ SOLN
20.0000 mg | Freq: Once | INTRAMUSCULAR | Status: AC
  Administered 2020-03-15: 20 mg via INTRAVENOUS
  Filled 2020-03-15: qty 2

## 2020-03-15 MED ORDER — LOSARTAN POTASSIUM 25 MG PO TABS
25.0000 mg | ORAL_TABLET | Freq: Every day | ORAL | Status: DC
  Administered 2020-03-16: 25 mg via ORAL
  Filled 2020-03-15: qty 1

## 2020-03-15 NOTE — Progress Notes (Signed)
Progress Note  Patient Name: Rhonda Palmer Date of Encounter: 03/15/2020  [facility] Cardiologist: [facility]  Subjective   This is reports continued chest pressure, shortness of breath on inspiration Leg swelling has improved Not much improvement in her abdominal distention Has not been out of bed  Inpatient Medications    Scheduled Meds: . atorvastatin  80 mg Oral QHS  . clonazePAM  0.5 mg Oral TID  . enoxaparin (LOVENOX) injection  0.5 mg/kg Subcutaneous Q24H  . furosemide  20 mg Intravenous Once  . insulin aspart  0-5 Units Subcutaneous QHS  . insulin aspart  0-9 Units Subcutaneous TID WC  . levothyroxine  225 mcg Oral Daily  . losartan  100 mg Oral Daily  . pantoprazole  40 mg Oral Daily  . propranolol  20 mg Oral TID  . QUEtiapine  600 mg Oral QHS  . sodium chloride flush  3 mL Intravenous Q12H   Continuous Infusions: . sodium chloride     PRN Meds: sodium chloride, acetaminophen, albuterol, dextromethorphan-guaiFENesin, hydrALAZINE, ibuprofen, nitroGLYCERIN, ondansetron (ZOFRAN) IV, sodium chloride flush, traMADol, zolpidem   Vital Signs    Vitals:   03/14/20 1941 03/15/20 0451 03/15/20 0811 03/15/20 1150  BP: 121/75 109/69 115/80 (!) 94/59  Pulse: 66 70 73 66  Resp: 20 20 18 20   Temp: 98.6 F (37 C) 97.8 F (36.6 C) (!) 97.4 F (36.3 C) 97.9 F (36.6 C)  TempSrc: Oral Oral Oral Oral  SpO2: 93% 96% 95% 96%  Weight:      Height:        Intake/Output Summary (Last 24 hours) at 03/15/2020 1338 Last data filed at 03/15/2020 1149 Gross per 24 hour  Intake 1200 ml  Output 2350 ml  Net -1150 ml   Last 3 Weights 03/14/2020 03/13/2020 03/12/2020  Weight (lbs) 358 lb 7.5 oz 257 lb 3.2 oz 351 lb 12.8 oz  Weight (kg) 162.6 kg 116.665 kg 159.575 kg      Telemetry    Normal sinus rhythm- Personally Reviewed  ECG    - Personally Reviewed  Physical Exam   GEN: No acute distress.  Morbid obesity Neck:  Unable to determine JVD Cardiac: RRR, no  murmurs, rubs, or gallops.  Respiratory: Clear to auscultation bilaterally. GI: Soft, nontender, non-distended  MS: No edema; No deformity. Neuro:  Nonfocal  Psych: Normal affect   Labs    High Sensitivity Troponin:   Recent Labs  Lab 02/22/20 1309 03/03/20 1043 03/11/20 0803 03/11/20 0951 03/11/20 1725  TROPONINIHS 5 4 3 3 4       Chemistry Recent Labs  Lab 03/11/20 0803 03/14/20 0909 03/15/20 0749  NA 138 137 136  K 4.9 4.0 5.2*  CL 100 93* 90*  CO2 27 32 33*  GLUCOSE 128* 113* 109*  BUN 22* 22* 23*  CREATININE 0.66 0.66 0.77  CALCIUM 9.0 8.6* 8.5*  GFRNONAA >60 >60 >60  ANIONGAP 11 12 13      Hematology Recent Labs  Lab 03/11/20 0803 03/12/20 0431 03/15/20 0749  WBC 9.6 7.2 10.3  RBC 3.92 3.98 4.08  HGB 10.9* 11.3* 11.4*  HCT 36.9 37.3 37.1  MCV 94.1 93.7 90.9  MCH 27.8 28.4 27.9  MCHC 29.5* 30.3 30.7  RDW 17.2* 17.2* 17.0*  PLT 307 313 243    BNP Recent Labs  Lab 03/11/20 0803  BNP 30.7     DDimer No results for input(s): DDIMER in the last 168 hours.   Radiology    No results found.  Cardiac Studies     Patient Profile     50 y.o. 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 pregnancy, lymphedema, prior BZD and opiate abuse, and obesity who is being seen for chest pain, diastolic CHF   Assessment & Plan    Chest pain Recent left heart catheterization no coronary disease, results discussed with her Still with left-sided chest pressure, atypical in nature -Echocardiogram with normal ejection fraction\ --Etiology of her symptoms unclear, possibly from diastolic CHF symptoms, or alternate diagnoses, noncardiac etiology -Given markedly elevated left ventricular end-diastolic pressure, concern for continued CHF Management as below  Chronic diastolic CHF - Volume status difficult to assess due to body habitus -Lasix IV  changed to oral by rounding team yesterday 2 days ago at the time of catheterization with markedly elevated left ventricular end-diastolic pressure -Lab work reviewed with hospitalist service, Climbing BUN, drop in chloride, rising CO2 --Consider low-dose IV Lasix this afternoon, reassessment of BMP tomorrow morning  Palpitations - Reports palpitations have not occurred during admission.  --Prior pulsating up into her neck, these have resolved  - TSH elevated started on levothyroxine --- Recommend ZIO monitor as outpatient   HTN Low blood pressure, Will decrease losartan from 100 down to 25 If blood pressure continues to run low may need to decrease propranolol  Psychiatric history Management per primary care, on Seroquel  Abnormal TSH - TSH 28 this admission - started on levothyroxine Outpatient monitoring   Total encounter time more than 35 minutes  Greater than 50% was spent in counseling and coordination of care with the patient   For questions or updates, please contact [facility] Please consult [email or web address] for contact info under        Signed, Julien Nordmann, MD  03/15/2020, 1:38 PM

## 2020-03-15 NOTE — Progress Notes (Signed)
[facility] PROGRESS NOTE    Tonny Bollmanara Lise Hudspeth  BJY:[id number]:[id number] DOB: 20-Apr-1970 DOA: 03/11/2020 PCP: Grayland JackWilliams, Christine G, FNP      Brief Narrative:  Mrs. Rhonda Palmer is a 50 y.o. F with morbid obesity BMI >60, schizoaffective disorder/Bipolar, HTN, asthma, DM, hypothyroidism and hx VTE no longer on [facility] who presented with intermittent chest pain now worsening for 2 weeks.  Evidently, these chest pains had been progressive, intermittent, and non-exertional.  She was being worked up as an outpatient by Cardiology, but her symptoms worsened and she came to the ER.  The pains were intermittent, non-exertional, not improved with NTG, aspirin or ibuprofen.  Worse with lying flat.    In the ER, slightly hypoxic.  Recent CTA in context of these symptoms was limited by habitus but no central PE.    2/13 Admitted 2/15 LHC for concern of unstable angina --> no coronary disease but did show severely elevated LVEDP and respiratory variation consistent with OSA         Assessment & Plan:  Acute on chronic diastolic CHF Hypertension Net -1 L yesterday, 4.6 L on admission. Creatinine stable, but potassium up, chloride down, bicarb up.  Cardiology transition to oral diuretics yesterday. -Small dose IV furosemide today -BMP tomorrow and tailor diuretics to lab work, exam is limited by habitus  -Daily weights, strict I/Os, daily BMP -New losartan, reduce dose -Continue propanolol     OSA Patient diagnosed in the past, prescribed CPAP but does not use it.  -CPAP -Consult TOC for CPAP (since she was diagnosed in the past)      Diabetes Glucoses controlled -Continue insulin corrections -Continue atorvastatin  Hypothyroidism -Continue levothyroxine  Schizoaffective disorder bipolar disorder -Continue Seroquel, clonazepam  Morbid obesity BMI 60 -Continue Protonix  Mild intermittent asthma No evidence of flare  Normocytic anemia Hgb stable, no clinical  bleeding         Disposition: Status is: Inpatient  Remains inpatient appropriate because:Still has symptoms, ongoing diuresis needed, requiring daily lab   Dispo: The patient is from: Home              Anticipated d/c is to: Home              Anticipated d/c date is: > 3 days              Patient currently is not medically stable to d/c.   Difficult to place patient No       Level of care: Med-Surg  The patient was admitted with chest symptoms, ultimately found to be due to congestive heart failure.  She will require ongoing IV Lasix, close lab monitoring, detailed diuretic therapy.     MDM: The below labs and imaging reports were reviewed and summarized above.  Medication management as above.    DVT prophylaxis: Lovenox  Code Status: FULL Family Communication:     Consultants:   Cardiology  Procedures:   2/14 echo -- EF 50-55%, normal valves  2/15 LHC -- no obstructive disease, LVEDP very elevated           Subjective: Patient feels less tightness in her legs.  She still has chest heaviness and shortness of breath.  No vomiting, confusion, sputum, fever.  Objective: Vitals:   03/15/20 0451 03/15/20 0811 03/15/20 1150 03/15/20 1341  BP: 109/69 115/80 (!) 94/59   Pulse: 70 73 66   Resp: 20 18 20    Temp: 97.8 F (36.6 C) (!) 97.4 F (36.3 C) 97.9  F (36.6 C)   TempSrc: Oral Oral Oral   SpO2: 96% 95% 96%   Weight:    (!) 159.4 kg  Height:        Intake/Output Summary (Last 24 hours) at 03/15/2020 1425 Last data filed at 03/15/2020 1356 Gross per 24 hour  Intake 1320 ml  Output 2350 ml  Net -1030 ml   Filed Weights   03/13/20 0338 03/14/20 0215 03/15/20 1341  Weight: 116.7 kg (!) 162.6 kg (!) 159.4 kg    Examination: General appearance: Morbidly obese adult female, lying in bed, no acute distress, interactive     HEENT:    Skin:  Cardiac: Heart sounds distant, JVP not visible due to body habitus, radial pulses not palpable  due to body habitus, difficult to assess for edema due to her lymphedema Respiratory: Lung sounds not auscultated due to body habitus, respiratory rate appears labored with exertion, normal at rest. Abdomen: Abdomen ample, no tenderness palpation MSK:  Neuro: Awake and alert, extraocular movements intact, moves all extremities with generalized weakness, speech fluent Psych: Sensorium intact and responding to questions, attention normal, affect blunted, judgment impaired           Data Reviewed: I have personally reviewed following labs and imaging studies:  CBC: Recent Labs  Lab 03/11/20 0803 03/12/20 0431 03/15/20 0749  WBC 9.6 7.2 10.3  HGB 10.9* 11.3* 11.4*  HCT 36.9 37.3 37.1  MCV 94.1 93.7 90.9  PLT 307 313 243   Basic Metabolic Panel: Recent Labs  Lab 03/11/20 0803 03/12/20 0431 03/14/20 0909 03/15/20 0749  NA 138  --  137 136  K 4.9  --  4.0 5.2*  CL 100  --  93* 90*  CO2 27  --  32 33*  GLUCOSE 128*  --  113* 109*  BUN 22*  --  22* 23*  CREATININE 0.66  --  0.66 0.77  CALCIUM 9.0  --  8.6* 8.5*  MG  --  2.3  --   --    GFR: Estimated Creatinine Clearance: 121.4 mL/min (by C-G formula based on SCr of 0.77 mg/dL). Liver Function Tests: No results for input(s): AST, ALT, ALKPHOS, BILITOT, PROT, ALBUMIN in the last 168 hours. No results for input(s): LIPASE, AMYLASE in the last 168 hours. No results for input(s): AMMONIA in the last 168 hours. Coagulation Profile: No results for input(s): INR, PROTIME in the last 168 hours. Cardiac Enzymes: No results for input(s): CKTOTAL, CKMB, CKMBINDEX, TROPONINI in the last 168 hours. BNP (last 3 results) No results for input(s): PROBNP in the last 8760 hours. HbA1C: No results for input(s): HGBA1C in the last 72 hours. CBG: Recent Labs  Lab 03/14/20 1149 03/14/20 1616 03/14/20 2050 03/15/20 0812 03/15/20 1142  GLUCAP 115* 116* 132* 106* 166*   Lipid Profile: No results for input(s): CHOL, HDL, LDLCALC,  TRIG, CHOLHDL, LDLDIRECT in the last 72 hours. Thyroid Function Tests: No results for input(s): TSH, T4TOTAL, FREET4, T3FREE, THYROIDAB in the last 72 hours. Anemia Panel: No results for input(s): VITAMINB12, FOLATE, FERRITIN, TIBC, IRON, RETICCTPCT in the last 72 hours. Urine analysis:    Component Value Date/Time   COLORURINE YELLOW (A) 01/23/2019 0853   APPEARANCEUR CLOUDY (A) 01/23/2019 0853   APPEARANCEUR Clear 04/29/2017 0918   LABSPEC 1.023 01/23/2019 0853   PHURINE 6.0 01/23/2019 0853   GLUCOSEU NEGATIVE 01/23/2019 0853   HGBUR NEGATIVE 01/23/2019 0853   BILIRUBINUR NEGATIVE 01/23/2019 0853   BILIRUBINUR Negative 04/29/2017 0918   KETONESUR NEGATIVE 01/23/2019  0853   PROTEINUR NEGATIVE 01/23/2019 0853   NITRITE NEGATIVE 01/23/2019 0853   LEUKOCYTESUR TRACE (A) 01/23/2019 0853   Sepsis Labs: @LABRCNTIP (procalcitonin:4,lacticacidven:4)  ) Recent Results (from the past 240 hour(s))  Resp Panel by RT-PCR (Flu A&B, Covid) Nasopharyngeal Swab     Status: None   Collection Time: 03/11/20  8:46 AM   Specimen: Nasopharyngeal Swab; Nasopharyngeal(NP) swabs in vial transport medium  Result Value Ref Range Status   SARS Coronavirus 2 by RT PCR NEGATIVE NEGATIVE Final    Comment: (NOTE) SARS-CoV-2 target nucleic acids are NOT DETECTED.  The SARS-CoV-2 RNA is generally detectable in upper respiratory specimens during the acute phase of infection. The lowest concentration of SARS-CoV-2 viral copies this assay can detect is 138 copies/mL. A negative result does not preclude SARS-Cov-2 infection and should not be used as the sole basis for treatment or other patient management decisions. A negative result may occur with  improper specimen collection/handling, submission of specimen other than nasopharyngeal swab, presence of viral mutation(s) within the areas targeted by this assay, and inadequate number of viral copies(<138 copies/mL). A negative result must be combined  with clinical observations, patient history, and epidemiological information. The expected result is Negative.  Fact Sheet for Patients:  03/13/20  Fact Sheet for Healthcare Providers:  BloggerCourse.com  This test is no t yet approved or cleared by the [employer]ious[employer].[place] FDA and  has been authorized for detection and/or diagnosis of SARS-CoV-2 by FDA under an Emergency Use Authorization (EUA). This EUA will remain  in effect (meaning this test can be used) for the duration of the COVID-19 declaration under Section 564(b)(1) of the Act, 21 U.S.C.section 360bbb-3(b)(1), unless the authorization is terminated  or revoked sooner.       Influenza A by PCR NEGATIVE NEGATIVE Final   Influenza B by PCR NEGATIVE NEGATIVE Final    Comment: (NOTE) The Xpert Xpress SARS-CoV-2/FLU/RSV plus assay is intended as an aid in the diagnosis of influenza from Nasopharyngeal swab specimens and should not be used as a sole basis for treatment. Nasal washings and aspirates are unacceptable for Xpert Xpress SARS-CoV-2/FLU/RSV testing.  Fact Sheet for Patients: [place]  Fact Sheet for Healthcare Providers: BloggerCourse.com  This test is not yet approved or cleared by the [employer]ious[employer]ker.[place] FDA and has been authorized for detection and/or diagnosis of SARS-CoV-2 by FDA under an Emergency Use Authorization (EUA). This EUA will remain in effect (meaning this test can be used) for the duration of the COVID-19 declaration under Section 564(b)(1) of the Act, 21 U.S.C. section 360bbb-3(b)(1), unless the authorization is terminated or revoked.  Performed at [facility], [address] [place], [place]          Radiology Studies: No results found.      Scheduled Meds: . atorvastatin  80 mg Oral QHS  . clonazePAM  0.5 mg Oral TID  . enoxaparin (LOVENOX) injection   0.5 mg/kg Subcutaneous Q24H  . furosemide  20 mg Intravenous Once  . insulin aspart  0-5 Units Subcutaneous QHS  . insulin aspart  0-9 Units Subcutaneous TID WC  . levothyroxine  225 mcg Oral Daily  . [START ON 03/16/2020] losartan  25 mg Oral Daily  . pantoprazole  40 mg Oral Daily  . propranolol  20 mg Oral TID  . QUEtiapine  600 mg Oral QHS  . sodium chloride flush  3 mL Intravenous Q12H   Continuous Infusions: . sodium chloride       LOS: 4  days    Time spent: 25 minutes    Alberteen Sam, MD [facility] 03/15/2020, 2:25 PM     Please page though AMION or Epic secure chat:  For [employer], Higher education careers adviser

## 2020-03-16 DIAGNOSIS — I200 Unstable angina: Secondary | ICD-10-CM | POA: Diagnosis not present

## 2020-03-16 DIAGNOSIS — R072 Precordial pain: Secondary | ICD-10-CM | POA: Diagnosis not present

## 2020-03-16 LAB — BASIC METABOLIC PANEL
Anion gap: 12 (ref 5–15)
BUN: 28 mg/dL — ABNORMAL HIGH (ref 6–20)
CO2: 35 mmol/L — ABNORMAL HIGH (ref 22–32)
Calcium: 9 mg/dL (ref 8.9–10.3)
Chloride: 90 mmol/L — ABNORMAL LOW (ref 98–111)
Creatinine, Ser: 0.79 mg/dL (ref 0.44–1.00)
GFR, Estimated: >60 mL/min (ref >60)
Glucose, Bld: 121 mg/dL — ABNORMAL HIGH (ref 70–99)
Potassium: 3.9 mmol/L (ref 3.5–5.1)
Sodium: 137 mmol/L (ref 135–145)

## 2020-03-16 LAB — GLUCOSE, CAPILLARY
Glucose-Capillary: 131 mg/dL — ABNORMAL HIGH (ref 70–99)
Glucose-Capillary: 116 mg/dL — ABNORMAL HIGH (ref 70–99)
Glucose-Capillary: 117 mg/dL — ABNORMAL HIGH (ref 70–99)
Glucose-Capillary: 141 mg/dL — ABNORMAL HIGH (ref 70–99)

## 2020-03-16 MED ORDER — FUROSEMIDE 10 MG/ML IJ SOLN
40.0000 mg | Freq: Once | INTRAMUSCULAR | Status: AC
  Administered 2020-03-16: 40 mg via INTRAVENOUS
  Filled 2020-03-16: qty 4

## 2020-03-16 NOTE — TOC Progression Note (Signed)
Transition of Care [facility]) - Progression Note    Patient Details  Name: Rhonda Palmer MRN: [id number] Date of Birth: [birth year]  [facility] CM/SW Contact  Hetty Ely, RN Phone Number: 03/16/2020, 3:16 PM  Clinical Narrative: TOC referral for C-Pap, [employer] notified and unable to process due to no sleep study on file. [employer] recommends NIV Trilogy. Attending notified and will have PCP to work up after discharge.           Expected Discharge Plan and Services                                                 Social Determinants of Health (SDOH) Interventions    Readmission Risk Interventions No flowsheet data found.

## 2020-03-16 NOTE — Progress Notes (Signed)
   Heart Failure Nurse Navigator Note  HFpEF 55 to 60%.  Left ventricular has normal function.  No left ventricular regional wall motion abnormalities noted.  Right ventricular systolic function is normal.  She presented to the emergency room with complaints of chest discomfort.  She underwent left heart catheterization which revealed severely elevated left ventricular filling pressures.  Comorbidities:  Morbid obesity Hypertension Hyperlipidemia Diabetes Anemia  Medications:  Atorvastatin 80 mg daily Losartan 100 mg daily Inderal 20 mg 3 times a day  Torsemide 40 mg is being recommended upon discharge.   Labs:  Sodium 137, potassium 3.9, chloride 90, CO2 35, BUN 28, creatinine 0.79.  Intake appears inaccurate at 240 mL Output 700 mL Weight 161.7 kg  Assessment:  General-she is awake and alert lying in bed, continues to complain of midsternal chest pressure, she states that is never really been gone.  She appears in no acute distress.  HEENT unable to assess for JVD due to body habitus.  Pupils are equal.   Cardiac-heart tones of regular rate and rhythm.  Chest-lungs are clear to anterior auscultation.  Musculoskeletal-lower extremities-obese no pitting edema.  Psych-is pleasant and appropriate.  Neurologic-speech is clear.   Met with patient today, went over by teach back method maintaining a low-sodium diet, also discussed importance of sticking with fluid restriction.   Again stressed the importance of following with the heart failure outpatient clinic.   Pricilla Riffle RN, CHFN

## 2020-03-16 NOTE — Progress Notes (Signed)
[facility] PROGRESS NOTE    Rhonda Palmer  TSV:[id number] DOB: 22-Sep-1970 DOA: 03/11/2020 PCP: Grayland Jack, FNP      Brief Narrative:  Rhonda Palmer is a 50 y.o. F with morbid obesity BMI >60, schizoaffective disorder/Bipolar, HTN, asthma, DM, hypothyroidism and hx VTE no longer on [facility] who presented with intermittent chest pain now worsening for 2 weeks.  Evidently, these chest pains had been progressive, intermittent, and non-exertional.  She was being worked up as an outpatient by Cardiology, but her symptoms worsened and she came to the ER.  The pains were intermittent, non-exertional, not improved with NTG, aspirin or ibuprofen.  Worse with lying flat.    In the ER, slightly hypoxic.  Recent CTA in context of these symptoms was limited by habitus but no central PE.    2/13 Admitted 2/15 [facility] for concern of unstable angina --> no coronary disease but did show severely elevated LVEDP and respiratory variation consistent with OSA         Assessment & Plan:  Acute on chronic diastolic CHF Hypertension Net even yesterday, creatinine unchanged, potassium normal, bicarbonate BUN have trended up slightly. Cardiology note difficulty assessing her volume status, different assessors this week have offered somewhat conflicting recommendations.   -Repeat Lasix -Daily weights, strict I/Os, daily BMP -Hold losartan -Continue propranolol      OSA Patient diagnosed in the past, prescribed CPAP but does not use it.  -CPAP  Diabetes Glucoses Controlled -Continue insulin -Cotninue statin  Hypothyroidism -Continue levothyroxine increased from previous  Schizoaffective disorder bipolar disorder -Continue Seroquel, clonazepam  Morbid obesity BMI 60 -Continue Protonix  Mild intermittent asthma No evidence of flare  Normocytic anemia Hgb stable, no clinical bleeding         Disposition: Status is: Inpatient  Remains inpatient  appropriate because:Still has symptoms, ongoing diuresis needed, requiring daily lab   Dispo: The patient is from: Home              Anticipated d/c is to: Home              Anticipated d/c date is: > 3 days              Patient currently is not medically stable to d/c.   Difficult to place patient No       Level of care: Med-Surg  The patient was admitted with chest symptoms, ultimately found to be due to congestive heart failure.   At this point, she has had absoluately no improvement in symptoms, and only minimal change in "leg tightness" I presume from diuresis.  We are forced to use her renal function and bicarb/bun to guide diuresis, and at present, these seem to be only minorly changed.  Continue Lasix Strict I/Os            MDM: The below labs and imaging reports were reviewed and summarized above.  Medication management as above.    DVT prophylaxis: Lovenox  Code Status: FULL Family Communication:     Consultants:   Cardiology  Procedures:   2/14 echo -- EF 50-55%, normal valves  2/15 LHC -- no obstructive disease, LVEDP very elevated           Subjective: Slightly less tightness in her legs.  Still chest heaviness, still shortness of breath.  No fever, no cough, no confusion.  She is overall weak.   Objective: Vitals:   03/16/20 0417 03/16/20 0753 03/16/20 1144 03/16/20 1500  BP: 107/85 122/73  97/74 110/81  Pulse: 72 81 65 72  Resp: 20 18 18 18   Temp: 98.7 F (37.1 C) 97.9 F (36.6 C) 98 F (36.7 C) 98.2 F (36.8 C)  TempSrc: Oral  Oral Oral  SpO2: 93% 95% (!) 88% 96%  Weight: (!) 161.7 kg     Height:        Intake/Output Summary (Last 24 hours) at 03/16/2020 1607 Last data filed at 03/16/2020 1344 Gross per 24 hour  Intake 960 ml  Output 2700 ml  Net -1740 ml   Filed Weights   03/14/20 0215 03/15/20 1341 03/16/20 0417  Weight: (!) 162.6 kg (!) 159.4 kg (!) 161.7 kg    Examination: General appearance: Morbidly  obese adult female, lying in bed, no acute distress, interactive, appears tired     HEENT:    Skin:  Cardiac: RRR, but heart sounds are extremely distant, not able to appreciate JVP, and her lymphedema precludes assessment of fluid status in the leg Respiratory: Lung sounds are distant not appreciable, her respiratory effort seems labored with exertion Abdomen: Abdomen without tenderness palpation MSK:  Neuro: Alert, extraocular movements intact, moves all extremities with generalized weakness, speech fluent Psych: Sensorium intact responding to questions, attention normal, affect blunted, judgment impaired             Data Reviewed: I have personally reviewed following labs and imaging studies:  CBC: Recent Labs  Lab 03/11/20 0803 03/12/20 0431 03/15/20 0749  WBC 9.6 7.2 10.3  HGB 10.9* 11.3* 11.4*  HCT 36.9 37.3 37.1  MCV 94.1 93.7 90.9  PLT 307 313 243   Basic Metabolic Panel: Recent Labs  Lab 03/11/20 0803 03/12/20 0431 03/14/20 0909 03/15/20 0749 03/16/20 0549  NA 138  --  137 136 137  K 4.9  --  4.0 5.2* 3.9  CL 100  --  93* 90* 90*  CO2 27  --  32 33* 35*  GLUCOSE 128*  --  113* 109* 121*  BUN 22*  --  22* 23* 28*  CREATININE 0.66  --  0.66 0.77 0.79  CALCIUM 9.0  --  8.6* 8.5* 9.0  MG  --  2.3  --   --   --    GFR: Estimated Creatinine Clearance: 122.7 mL/min (by C-G formula based on SCr of 0.79 mg/dL). Liver Function Tests: No results for input(s): AST, ALT, ALKPHOS, BILITOT, PROT, ALBUMIN in the last 168 hours. No results for input(s): LIPASE, AMYLASE in the last 168 hours. No results for input(s): AMMONIA in the last 168 hours. Coagulation Profile: No results for input(s): INR, PROTIME in the last 168 hours. Cardiac Enzymes: No results for input(s): CKTOTAL, CKMB, CKMBINDEX, TROPONINI in the last 168 hours. BNP (last 3 results) No results for input(s): PROBNP in the last 8760 hours. HbA1C: No results for input(s): HGBA1C in the last 72  hours. CBG: Recent Labs  Lab 03/15/20 1142 03/15/20 1621 03/15/20 2046 03/16/20 0754 03/16/20 1143  GLUCAP 166* 123* 113* 131* 116*   Lipid Profile: No results for input(s): CHOL, HDL, LDLCALC, TRIG, CHOLHDL, LDLDIRECT in the last 72 hours. Thyroid Function Tests: No results for input(s): TSH, T4TOTAL, FREET4, T3FREE, THYROIDAB in the last 72 hours. Anemia Panel: No results for input(s): VITAMINB12, FOLATE, FERRITIN, TIBC, IRON, RETICCTPCT in the last 72 hours. Urine analysis:    Component Value Date/Time   COLORURINE YELLOW (A) 01/23/2019 0853   APPEARANCEUR CLOUDY (A) 01/23/2019 0853   APPEARANCEUR Clear 04/29/2017 0918   LABSPEC 1.023 01/23/2019 01/25/2019  PHURINE 6.0 01/23/2019 0853   GLUCOSEU NEGATIVE 01/23/2019 0853   HGBUR NEGATIVE 01/23/2019 0853   BILIRUBINUR NEGATIVE 01/23/2019 0853   BILIRUBINUR Negative 04/29/2017 0918   KETONESUR NEGATIVE 01/23/2019 0853   PROTEINUR NEGATIVE 01/23/2019 0853   NITRITE NEGATIVE 01/23/2019 0853   LEUKOCYTESUR TRACE (A) 01/23/2019 0853   Sepsis Labs: @LABRCNTIP (procalcitonin:4,lacticacidven:4)  ) Recent Results (from the past 240 hour(s))  Resp Panel by RT-PCR (Flu A&B, Covid) Nasopharyngeal Swab     Status: None   Collection Time: 03/11/20  8:46 AM   Specimen: Nasopharyngeal Swab; Nasopharyngeal(NP) swabs in vial transport medium  Result Value Ref Range Status   SARS Coronavirus 2 by RT PCR NEGATIVE NEGATIVE Final    Comment: (NOTE) SARS-CoV-2 target nucleic acids are NOT DETECTED.  The SARS-CoV-2 RNA is generally detectable in upper respiratory specimens during the acute phase of infection. The lowest concentration of SARS-CoV-2 viral copies this assay can detect is 138 copies/mL. A negative result does not preclude SARS-Cov-2 infection and should not be used as the sole basis for treatment or other patient management decisions. A negative result may occur with  improper specimen collection/handling, submission of specimen  other than nasopharyngeal swab, presence of viral mutation(s) within the areas targeted by this assay, and inadequate number of viral copies(<138 copies/mL). A negative result must be combined with clinical observations, patient history, and epidemiological information. The expected result is Negative.  Fact Sheet for Patients:  03/13/20  Fact Sheet for Healthcare Providers:  BloggerCourse.com  This test is no t yet approved or cleared by the [employer].[place] FDA and  has been authorized for detection and/or diagnosis of SARS-CoV-2 by FDA under an Emergency Use Authorization (EUA). This EUA will remain  in effect (meaning this test can be used) for the duration of the COVID-19 declaration under Section 564(b)(1) of the Act, 21 U.S.C.section 360bbb-3(b)(1), unless the authorization is terminated  or revoked sooner.       Influenza A by PCR NEGATIVE NEGATIVE Final   Influenza B by PCR NEGATIVE NEGATIVE Final    Comment: (NOTE) The Xpert Xpress SARS-CoV-2/FLU/RSV plus assay is intended as an aid in the diagnosis of influenza from Nasopharyngeal swab specimens and should not be used as a sole basis for treatment. Nasal washings and aspirates are unacceptable for Xpert Xpress SARS-CoV-2/FLU/RSV testing.  Fact Sheet for Patients: [place]  Fact Sheet for Healthcare Providers: BloggerCourse.com  This test is not yet approved or cleared by the [employer]ious[employer]ker.[place] FDA and has been authorized for detection and/or diagnosis of SARS-CoV-2 by FDA under an Emergency Use Authorization (EUA). This EUA will remain in effect (meaning this test can be used) for the duration of the COVID-19 declaration under Section 564(b)(1) of the Act, 21 U.S.C. section 360bbb-3(b)(1), unless the authorization is terminated or revoked.  Performed at [facility], [address],  [place], [place]          Radiology Studies: No results found.      Scheduled Meds: . atorvastatin  80 mg Oral QHS  . clonazePAM  0.5 mg Oral TID  . enoxaparin (LOVENOX) injection  0.5 mg/kg Subcutaneous Q24H  . insulin aspart  0-5 Units Subcutaneous QHS  . insulin aspart  0-9 Units Subcutaneous TID WC  . levothyroxine  225 mcg Oral Daily  . losartan  25 mg Oral Daily  . pantoprazole  40 mg Oral Daily  . propranolol  20 mg Oral TID  . QUEtiapine  600 mg Oral QHS  . sodium  chloride flush  3 mL Intravenous Q12H   Continuous Infusions: . sodium chloride       LOS: 5 days    Time spent: 25  minutes    Alberteen Samhristopher P Danford, MD [facility] 03/16/2020, 4:07 PM     Please page though AMION or Epic secure chat:  For [employer] Holdings Corporation[employer]ion password, Higher education careers advisercontact charge nurse

## 2020-03-16 NOTE — Progress Notes (Signed)
Progress Note  Patient Name: Rhonda Palmer Date of Encounter: 03/16/2020  [facility] Cardiologist: [facility]- Ag[facility] Etang  Subjective   Still with substernal chest pain and tightness.  She was diuresed.  Renal function is starting to worsen with gradual increase in creatinine and BUN.  She was on torsemide 20 mg once daily at home.  Inpatient Medications    Scheduled Meds: . atorvastatin  80 mg Oral QHS  . clonazePAM  0.5 mg Oral TID  . enoxaparin (LOVENOX) injection  0.5 mg/kg Subcutaneous Q24H  . furosemide  40 mg Intravenous Once  . insulin aspart  0-5 Units Subcutaneous QHS  . insulin aspart  0-9 Units Subcutaneous TID WC  . levothyroxine  225 mcg Oral Daily  . losartan  25 mg Oral Daily  . pantoprazole  40 mg Oral Daily  . propranolol  20 mg Oral TID  . QUEtiapine  600 mg Oral QHS  . sodium chloride flush  3 mL Intravenous Q12H   Continuous Infusions: . sodium chloride     PRN Meds: sodium chloride, acetaminophen, albuterol, dextromethorphan-guaiFENesin, nitroGLYCERIN, ondansetron (ZOFRAN) IV, sodium chloride flush, traMADol, zolpidem   Vital Signs    Vitals:   03/15/20 2008 03/16/20 0036 03/16/20 0417 03/16/20 0753  BP:  133/66 107/85 122/73  Pulse: 65  72 81  Resp:   20 18  Temp:   98.7 F (37.1 C) 97.9 F (36.6 C)  TempSrc:   Oral   SpO2: 93%  93% 95%  Weight:   (!) 161.7 kg   Height:        Intake/Output Summary (Last 24 hours) at 03/16/2020 0853 Last data filed at 03/16/2020 0600 Gross per 24 hour  Intake 1200 ml  Output 1200 ml  Net 0 ml   Last 3 Weights 03/16/2020 03/15/2020 03/14/2020  Weight (lbs) 356 lb 7.7 oz 351 lb 6.4 oz 358 lb 7.5 oz  Weight (kg) 161.7 kg 159.394 kg 162.6 kg      Telemetry    Normal sinus rhythm- Personally Reviewed  ECG    - Personally Reviewed  Physical Exam   GEN: No acute distress.  Morbid obesity Neck:  Unable to determine JVD Cardiac: RRR, no murmurs, rubs, or gallops.  Respiratory: Clear to  auscultation bilaterally. GI: Soft, nontender, non-distended  MS: No edema; No deformity. Neuro:  Nonfocal  Psych: Normal affect   Labs    High Sensitivity Troponin:   Recent Labs  Lab 02/22/20 1309 03/03/20 1043 03/11/20 0803 03/11/20 0951 03/11/20 1725  TROPONINIHS 5 4 3 3 4       Chemistry Recent Labs  Lab 03/14/20 0909 03/15/20 0749 03/16/20 0549  NA 137 136 137  K 4.0 5.2* 3.9  CL 93* 90* 90*  CO2 32 33* 35*  GLUCOSE 113* 109* 121*  BUN 22* 23* 28*  CREATININE 0.66 0.77 0.79  CALCIUM 8.6* 8.5* 9.0  GFRNONAA >60 >60 >60  ANIONGAP 12 13 12      Hematology Recent Labs  Lab 03/11/20 0803 03/12/20 0431 03/15/20 0749  WBC 9.6 7.2 10.3  RBC 3.92 3.98 4.08  HGB 10.9* 11.3* 11.4*  HCT 36.9 37.3 37.1  MCV 94.1 93.7 90.9  MCH 27.8 28.4 27.9  MCHC 29.5* 30.3 30.7  RDW 17.2* 17.2* 17.0*  PLT 307 313 243    BNP Recent Labs  Lab 03/11/20 0803  BNP 30.7     DDimer No results for input(s): DDIMER in the last 168 hours.   Radiology    No  results found.  Cardiac Studies     Patient Profile     50 y.o. 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 pregnancy, lymphedema, prior BZD and opiate abuse, and obesity who is being seen for chest pain, diastolic CHF   Assessment & Plan    Chest pain Recent left heart catheterization no coronary disease, results discussed with her Still with left-sided chest pressure, atypical in nature -Echocardiogram with normal ejection fraction\ --Etiology of her symptoms unclear, possibly from diastolic CHF symptoms, or alternate diagnoses, noncardiac etiology   Chronic diastolic CHF - Volume status difficult to assess due to body habitus LVEDP was significantly elevated during cardiac catheterization but the patient was diuresed since then. Her renal function is starting to worsen and I do not think she  requires further IV diuresis.  She was on torsemide 20 mg once daily at home. I recommend torsemide 40 mg once daily upon discharge.  Palpitations -This has been a chronic issue for the patient was no evidence of arrhythmia on telemetry.  This can be evaluated with a ZIO monitor as an outpatient.  HTN Blood pressure is controlled.  Psychiatric history Management per primary care, on Seroquel  Abnormal TSH - TSH 28 this admission - started on levothyroxine Outpatient monitoring   The patient can be discharged home from a cardiac standpoint on torsemide 40 mg once daily.  She has an appointment scheduled with Dr. Azucena Cecil on March 17 at 9:40 am.       For questions or updates, please contact [facility] Please consult [email or web address] for contact info under        Signed, Lorine Bears, MD  03/16/2020, 8:53 AM

## 2020-03-17 DIAGNOSIS — I5033 Acute on chronic diastolic (congestive) heart failure: Secondary | ICD-10-CM

## 2020-03-17 LAB — GLUCOSE, CAPILLARY
Glucose-Capillary: 101 mg/dL — ABNORMAL HIGH (ref 70–99)
Glucose-Capillary: 131 mg/dL — ABNORMAL HIGH (ref 70–99)
Glucose-Capillary: 136 mg/dL — ABNORMAL HIGH (ref 70–99)
Glucose-Capillary: 147 mg/dL — ABNORMAL HIGH (ref 70–99)

## 2020-03-17 LAB — BASIC METABOLIC PANEL
Anion gap: 12 (ref 5–15)
BUN: 24 mg/dL — ABNORMAL HIGH (ref 6–20)
CO2: 35 mmol/L — ABNORMAL HIGH (ref 22–32)
Calcium: 9.2 mg/dL (ref 8.9–10.3)
Chloride: 88 mmol/L — ABNORMAL LOW (ref 98–111)
Creatinine, Ser: 0.7 mg/dL (ref 0.44–1.00)
GFR, Estimated: >60 mL/min (ref >60)
Glucose, Bld: 120 mg/dL — ABNORMAL HIGH (ref 70–99)
Potassium: 3.5 mmol/L (ref 3.5–5.1)
Sodium: 135 mmol/L (ref 135–145)

## 2020-03-17 MED ORDER — TORSEMIDE 20 MG PO TABS
40.0000 mg | ORAL_TABLET | Freq: Every day | ORAL | Status: DC
  Administered 2020-03-17 – 2020-03-18 (×2): 40 mg via ORAL
  Filled 2020-03-17 (×2): qty 2

## 2020-03-17 MED ORDER — POTASSIUM CHLORIDE CRYS ER 20 MEQ PO TBCR
40.0000 meq | EXTENDED_RELEASE_TABLET | Freq: Once | ORAL | Status: AC
  Administered 2020-03-17: 40 meq via ORAL
  Filled 2020-03-17: qty 2

## 2020-03-17 MED ORDER — FUROSEMIDE 10 MG/ML IJ SOLN
20.0000 mg | Freq: Once | INTRAMUSCULAR | Status: AC
  Administered 2020-03-17: 20 mg via INTRAVENOUS
  Filled 2020-03-17: qty 2

## 2020-03-17 NOTE — Progress Notes (Signed)
[facility] PROGRESS NOTE    Rhonda Palmer  WUJ:[id number] DOB: Jul 27, 1970 DOA: 03/11/2020 PCP: Grayland Jack, FNP      Brief Narrative:  Rhonda Palmer is a 50 y.o. 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  Evidently, these chest pains had been progressive, intermittent, and non-exertional.  She was being worked up as an outpatient by Cardiology, but her symptoms worsened and she came to the ER.  The pains were intermittent, non-exertional, not improved with NTG, aspirin or ibuprofen.  Worse with lying flat.    In the ER, slightly hypoxic.  Recent CTA in context of these symptoms was limited by habitus but no central PE.    2/13 Admitted 2/15 [facility] for concern of unstable angina --> no coronary disease but did show severely elevated LVEDP and respiratory variation consistent with OSA         Assessment & Plan:  Acute on chronic diastolic CHF Net negative 2L yesterday again with IV Lasix, Cr stable.  -Defer diuretic to Cardiology -Daily weights, strict I/Os, daily BMP -Continue propranolol    OSA Patient diagnosed in the past, prescribed CPAP but does not use it. -CPAP  Diabetes Glucoses controlled -Continue insulin corrections -Continue statin  Hypothyoiridms -Continue levothyroxine, dose increased this hospital stay  Schizoaffective disorder, Bipolar -Contiue Seroquel, clonazepam  Morbid obesity BMI 60 -Continue protonix  Mild intermittent asthma No evidence of flare  Normocytic anemia Hgb stable, no clinical bleeding         Disposition: Status is: Inpatient  Remains inpatient appropriate because:Still has symptoms, ongoing diuresis needed, requiring daily lab   Dispo: The patient is from: Home              Anticipated d/c is to: Home              Anticipated d/c  date is: 1 day              Patient currently is not medically stable to d/c.   Difficult to place patient No       Level of care: Med-Surg  The patient was admitted with chest symptoms, ultimately found to be due to congestive heart failure.  Cardiology have transitioned to oral Torsemide, hopefully home tomorrow.          MDM: The below labs and imaging reports were reviewed and summarized above.  Medication management as above.    DVT prophylaxis: Lovenox  Code Status: FULL Family Communication:     Consultants:   Cardiology  Procedures:   2/14 echo -- EF 50-55%, normal valves  2/15 LHC -- no obstructive disease, LVEDP very elevated           Subjective: Chest heaviness noted, no change in legs, breathing, fever, confusion, cough.     Objective: Vitals:   03/17/20 1137 03/17/20 1146 03/17/20 1150 03/17/20 1604  BP: 115/83   120/87  Pulse: 67 71  71  Resp: 18   18  Temp: 97.8 F (36.6 C)   98.1 F (36.7 C)  TempSrc: Oral   Oral  SpO2: 92% 92% 91% 93%  Weight:      Height:        Intake/Output Summary (Last 24 hours) at 03/17/2020 1623 Last data filed at 03/17/2020 1607 Gross per 24 hour  Intake 1060 ml  Output 3880 ml  Net -  2820 ml   Filed Weights   03/15/20 1341 03/16/20 0417 03/17/20 0100  Weight: (!) 159.4 kg (!) 161.7 kg (!) 162 kg    Examination: General appearance: Morbidly obese adult female, lying in bed, no acute distress, interactive     HEENT:    Skin:  Cardiac: Heart sounds distant, no JVP appreciable, lymphedema precludes assessment of fluid status in the legs, no precordial tenderness to palpation Respiratory: Respiratory effort appears normal, lung sounds distant, not appreciable, I do not appreciate rales or wheezing Abdomen:   MSK:  Neuro: Alert, extraocular movements intact, moves all extremities with generalized weakness, speech fluent Psych: Sensorium intact responding to questions, attention normal,  affect normal, judgment supranormal              Data Reviewed: I have personally reviewed following labs and imaging studies:  CBC: Recent Labs  Lab 03/11/20 0803 03/12/20 0431 03/15/20 0749  WBC 9.6 7.2 10.3  HGB 10.9* 11.3* 11.4*  HCT 36.9 37.3 37.1  MCV 94.1 93.7 90.9  PLT 307 313 243   Basic Metabolic Panel: Recent Labs  Lab 03/11/20 0803 03/12/20 0431 03/14/20 0909 03/15/20 0749 03/16/20 0549 03/17/20 0446  NA 138  --  137 136 137 135  K 4.9  --  4.0 5.2* 3.9 3.5  CL 100  --  93* 90* 90* 88*  CO2 27  --  32 33* 35* 35*  GLUCOSE 128*  --  113* 109* 121* 120*  BUN 22*  --  22* 23* 28* 24*  CREATININE 0.66  --  0.66 0.77 0.79 0.70  CALCIUM 9.0  --  8.6* 8.5* 9.0 9.2  MG  --  2.3  --   --   --   --    GFR: Estimated Creatinine Clearance: 122.8 mL/min (by C-G formula based on SCr of 0.7 mg/dL). Liver Function Tests: No results for input(s): AST, ALT, ALKPHOS, BILITOT, PROT, ALBUMIN in the last 168 hours. No results for input(s): LIPASE, AMYLASE in the last 168 hours. No results for input(s): AMMONIA in the last 168 hours. Coagulation Profile: No results for input(s): INR, PROTIME in the last 168 hours. Cardiac Enzymes: No results for input(s): CKTOTAL, CKMB, CKMBINDEX, TROPONINI in the last 168 hours. BNP (last 3 results) No results for input(s): PROBNP in the last 8760 hours. HbA1C: No results for input(s): HGBA1C in the last 72 hours. CBG: Recent Labs  Lab 03/16/20 1646 03/16/20 2036 03/17/20 0759 03/17/20 1137 03/17/20 1605  GLUCAP 117* 141* 101* 131* 136*   Lipid Profile: No results for input(s): CHOL, HDL, LDLCALC, TRIG, CHOLHDL, LDLDIRECT in the last 72 hours. Thyroid Function Tests: No results for input(s): TSH, T4TOTAL, FREET4, T3FREE, THYROIDAB in the last 72 hours. Anemia Panel: No results for input(s): VITAMINB12, FOLATE, FERRITIN, TIBC, IRON, RETICCTPCT in the last 72 hours. Urine analysis:    Component Value Date/Time    COLORURINE YELLOW (A) 01/23/2019 0853   APPEARANCEUR CLOUDY (A) 01/23/2019 0853   APPEARANCEUR Clear 04/29/2017 0918   LABSPEC 1.023 01/23/2019 0853   PHURINE 6.0 01/23/2019 0853   GLUCOSEU NEGATIVE 01/23/2019 0853   HGBUR NEGATIVE 01/23/2019 0853   BILIRUBINUR NEGATIVE 01/23/2019 0853   BILIRUBINUR Negative 04/29/2017 0918   KETONESUR NEGATIVE 01/23/2019 0853   PROTEINUR NEGATIVE 01/23/2019 0853   NITRITE NEGATIVE 01/23/2019 0853   LEUKOCYTESUR TRACE (A) 01/23/2019 0853   Sepsis Labs: @LABRCNTIP (procalcitonin:4,lacticacidven:4)  ) Recent Results (from the past 240 hour(s))  Resp Panel by RT-PCR (Flu A&B, Covid) Nasopharyngeal Swab  Status: None   Collection Time: 03/11/20  8:46 AM   Specimen: Nasopharyngeal Swab; Nasopharyngeal(NP) swabs in vial transport medium  Result Value Ref Range Status   SARS Coronavirus 2 by RT PCR NEGATIVE NEGATIVE Final    Comment: (NOTE) SARS-CoV-2 target nucleic acids are NOT DETECTED.  The SARS-CoV-2 RNA is generally detectable in upper respiratory specimens during the acute phase of infection. The lowest concentration of SARS-CoV-2 viral copies this assay can detect is 138 copies/mL. A negative result does not preclude SARS-Cov-2 infection and should not be used as the sole basis for treatment or other patient management decisions. A negative result may occur with  improper specimen collection/handling, submission of specimen other than nasopharyngeal swab, presence of viral mutation(s) within the areas targeted by this assay, and inadequate number of viral copies(<138 copies/mL). A negative result must be combined with clinical observations, patient history, and epidemiological information. The expected result is Negative.  Fact Sheet for Patients:  BloggerCourse.com  Fact Sheet for Healthcare Providers:  SeriousBroker.it  This test is no t yet approved or cleared by the [place]  FDA and  has been authorized for detection and/or diagnosis of SARS-CoV-2 by FDA under an Emergency Use Authorization (EUA). This EUA will remain  in effect (meaning this test can be used) for the duration of the COVID-19 declaration under Section 564(b)(1) of the Act, 21 U.S.C.section 360bbb-3(b)(1), unless the authorization is terminated  or revoked sooner.       Influenza A by PCR NEGATIVE NEGATIVE Final   Influenza B by PCR NEGATIVE NEGATIVE Final    Comment: (NOTE) The Xpert Xpress SARS-CoV-2/FLU/RSV plus assay is intended as an aid in the diagnosis of influenza from Nasopharyngeal swab specimens and should not be used as a sole basis for treatment. Nasal washings and aspirates are unacceptable for Xpert Xpress SARS-CoV-2/FLU/RSV testing.  Fact Sheet for Patients: BloggerCourse.com  Fact Sheet for Healthcare Providers: SeriousBroker.it  This test is not yet approved or cleared by the [place] FDA and has been authorized for detection and/or diagnosis of SARS-CoV-2 by FDA under an Emergency Use Authorization (EUA). This EUA will remain in effect (meaning this test can be used) for the duration of the COVID-19 declaration under Section 564(b)(1) of the Act, 21 U.S.C. section 360bbb-3(b)(1), unless the authorization is terminated or revoked.  Performed at [facility], [address], [place], [place] [address]          Radiology Studies: No results found.      Scheduled Meds: . atorvastatin  80 mg Oral QHS  . clonazePAM  0.5 mg Oral TID  . enoxaparin (LOVENOX) injection  0.5 mg/kg Subcutaneous Q24H  . insulin aspart  0-5 Units Subcutaneous QHS  . insulin aspart  0-9 Units Subcutaneous TID WC  . levothyroxine  225 mcg Oral Daily  . pantoprazole  40 mg Oral Daily  . propranolol  20 mg Oral TID  . QUEtiapine  600 mg Oral QHS  . sodium chloride flush  3 mL Intravenous Q12H  . torsemide  40 mg  Oral Daily   Continuous Infusions: . sodium chloride       LOS: 6 days    Time spent: 25 minutes    Alberteen Sam, MD [facility] 03/17/2020, 4:23 PM     Please page though AMION or Epic secure chat:  For [employer], Higher education careers adviser

## 2020-03-17 NOTE — Evaluation (Signed)
Physical Therapy Evaluation Patient Details Name: Rhonda Palmer MRN: [id number] DOB: [birth year] Today's Date: 03/17/2020   History of Present Illness  Pt admitted to [facility] on 03/11/20 with c/o bil chest pain and hypoxia, described as non-radiating, sharp, achy, and worse when lying flat. Significant PMH includes: morbid obesity, schizoaffective disorder/Bipolar, HTN, asthma, DM, hypothyroidism, dCHF, and hx of VTE no longer on AC. Pt with recent L heart catheterization with no coronary disease.    Clinical Impression  Pt is a 50 year old F admitted to hospital on 03/11/20 for chest pain. At baseline, pt was Ind with all ADL's, IADL's, limited community ambulation without AD and driving; however, pt endorses increased difficulty with donning/doffing shoes/socks due to habitus. Pt presents with RLE weakness at baseline, obesity, fair standing balance, anxiety, BLE edema, chest pain, and decreased activity tolerance resulting in impaired functional mobility from baseline. Due to deficits, pt required mod I for bed mobility, CGA-supervision for transfers, and CGA-supervision for short distance ambulation with RW. Further mobility limited due to pt c/o increased chest pain; RN notified. Pt with RPE of 2-3/10 indicating "light activity" but notes that she felt fatigued after mobility; pt able to maintain SpO2 >90% on RA. Deficits limit the pt's ability to safely and independently perform ADL's, transfer, and ambulate, and increase her risk of falls. Pt will benefit from acute skilled PT services to address deficits for return to baseline function. At this time, PT recommends DC home with HHPT, initial 24/7 supervision, bariatric RW and bariatric 3in1 for energy conservation and safety with mobility. If daughter is to start work soon, pt may benefit from [facility] Aide to assist pt at home when daughter is working. Pt may also benefit from reacher, sock aide, and shoe horn to assist with donning/doffing shoes and  socks.     Follow Up Recommendations Home health PT;Supervision/Assistance - 24 hour    Equipment Recommendations  Rolling walker with 5" wheels;3in1 (PT) (bariatric RW and 3in1)    Recommendations for Other Services       Precautions / Restrictions Precautions Precautions: Fall Restrictions Weight Bearing Restrictions: No Other Position/Activity Restrictions: 1500 mL fluid restriction      Mobility  Bed Mobility Overal bed mobility: Modified Independent Bed Mobility: Supine to Sit     Supine to sit: HOB elevated;Modified independent (Device/Increase time)     General bed mobility comments: Mod I to sit EOB with HOB elevated and increased UE reliance on bedrail. Increased time/effort to perform mobility. Patient Response: Anxious  Transfers Overall transfer level: Needs assistance   Transfers: Sit to/from Stand Sit to Stand: Min guard;Supervision         General transfer comment: Intermittent assist from CGA-supervision for safety to perform STS from elevated bed height and recliner with RW. Verbal cues for hand placement to ensure safety.  Ambulation/Gait Ambulation/Gait assistance: Min guard Gait Distance (Feet): 12 Feet (61ft x1, 40ft x1) Assistive device: Rolling walker (2 wheeled)   Gait velocity: decreased   General Gait Details: CGA for safety to ambulate short distance with RW; further mobility limited due to increased c/o chest pain. RPE of 2-3/10 indicating "light activity" with SpO2 >90% on RA. Pt demonstrates slowed cadence, early reciprocal gait, decreased step length/foot clearance R>L, and labored breathing.     Balance Overall balance assessment: Needs assistance Sitting-balance support: No upper extremity supported;Feet supported Sitting balance-Leahy Scale: Good Sitting balance - Comments: Good seated balance at EOB; no LOB   Standing balance support: Bilateral upper extremity supported;During  functional activity Standing balance-Leahy  Scale: Fair Standing balance comment: Fair standing balance with BUE supported on RW                             Pertinent Vitals/Pain Pain Assessment: 0-10 Pain Score: 5  Pain Location: chest pain, radiating through back Pain Descriptors / Indicators: Heaviness;Dull;Tightness Pain Intervention(s): Limited activity within patient's tolerance;Monitored during session;Repositioned;Relaxation (RN notified)    Home Living Family/patient expects to be discharged to:: Private residence Living Arrangements: Children (18yo daughter) Available Help at Discharge: Family;Available PRN/intermittently (Per pt, daughter about to start working. Pt notes she was going to reach out to Rhonda Palmer to inquire about assist at home) Type of Home: Apartment (1st floor apt) Home Access: Level entry     Home Layout: One level Home Equipment: None      Prior Function Level of Independence: Independent         Comments: Pt reports being Ind with all ADL's, IADL's, driving, and limited community ambulation without AD. Pt endorses difficulty donning/doffing shoes and socks due to habitus, and mainly wears slip on shoes.     Hand Dominance        Extremity/Trunk Assessment   Upper Extremity Assessment Upper Extremity Assessment: Overall WFL for tasks assessed    Lower Extremity Assessment Lower Extremity Assessment: Generalized weakness;RLE deficits/detail;LLE deficits/detail RLE Deficits / Details: Grossly 2+/5 RLE Sensation: WNL RLE Coordination: WNL LLE Deficits / Details: Limited ankle AROM; grossly 4/5 strength LLE Sensation: WNL LLE Coordination: WNL       Communication   Communication: No difficulties  Cognition Arousal/Alertness: Awake/alert Behavior During Therapy: WFL for tasks assessed/performed Overall Cognitive Status: Within Functional Limits for tasks assessed                                 General Comments: Pt A&O x4 and able to follow 100% of simple  2-step commands. Pt becomes more anxious as session proceeds due to increased chest pain      General Comments General comments (skin integrity, edema, etc.): Pt became increasingly anxious throughout session due to worsening c/o chest pain from 5/10-7/10 post session; RN notified. Pt anxious regarding DC date/plan; RN and MD notified. Pt motivated to work with PT to get better. +1 non-pitting edema on RLE, +2 non-pitting edema on LLE.    Exercises Other Exercises Other Exercises: Pt able to participate in bed mobility, transfers, and short distance ambulation with RW. Further mobility limited secondary to chest pain. Pt grossly required supervision-CGA for safety with mobility. Gait deviations, fatigue, and chest pain increase the pt's risk of falls. Other Exercises: Pt educated regarding: PT role/POC, DC recommendations, activity modification/pacing, edema management, using BSC for toileting instead of purewick when able. Pt verbalized understanding of education.   Assessment/Plan    PT Assessment Patient needs continued PT services  PT Problem List Decreased strength;Decreased mobility;Decreased range of motion;Decreased activity tolerance;Decreased balance;Pain       PT Treatment Interventions Gait training;Functional mobility training;Therapeutic activities;Therapeutic exercise;Balance training;Neuromuscular re-education    PT Goals (Current goals can be found in the Care Plan section)  Acute Rehab PT Goals Patient Stated Goal: "to walk on a treadmill" PT Goal Formulation: With patient Time For Goal Achievement: 03/31/20 Potential to Achieve Goals: Fair    Frequency Min 2X/week   Barriers to discharge Decreased caregiver support Decreased caregiver support when daughter starts working  AM-PAC PT "6 Clicks" Mobility  Outcome Measure Help needed turning from your back to your side while in a flat bed without using bedrails?: None Help needed moving from lying on your back  to sitting on the side of a flat bed without using bedrails?: None Help needed moving to and from a bed to a chair (including a wheelchair)?: A Little Help needed standing up from a chair using your arms (e.g., wheelchair or bedside chair)?: A Little Help needed to walk in hospital room?: A Little Help needed climbing 3-5 steps with a railing? : A Little 6 Click Score: 20    End of Session Equipment Utilized During Treatment: Gait belt Activity Tolerance: Patient limited by fatigue;Patient tolerated treatment well;Patient limited by pain Patient left: in chair;with call bell/phone within reach Nurse Communication: Mobility status (chest pain) PT Visit Diagnosis: Unsteadiness on feet (R26.81);Muscle weakness (generalized) (M62.81);Difficulty in walking, not elsewhere classified (R26.2);Pain Pain - Right/Left:  (chest)    Time: 0258-5277 PT Time Calculation (min) (ACUTE ONLY): 37 min   Charges:   PT Evaluation $PT Eval Moderate Complexity: 1 Mod PT Treatments $Therapeutic Activity: 8-22 mins       Vira Blanco, PT, DPT 12:04 PM,03/17/20

## 2020-03-17 NOTE — Progress Notes (Signed)
Progress Note  Patient Name: Rhonda Palmer Date of Encounter: 03/17/2020  Primary Cardiologist: Debbe Odea, MD   Subjective   Reports ongoing substernal chest pain and tightness.  States her most recent episode started approximately 3 AM this morning and has been constant.  She denies any shortness of breath but does report that the pain makes it difficult for her to breathe.  She states that the pain radiates from her chest and into her back.  Discussed her volume status.  She feels as if she is holding onto fluid in her legs.  She also reports abdominal bloating.  She does have several drinks in front of her.  CHF education provided.   Inpatient Medications    Scheduled Meds: . atorvastatin  80 mg Oral QHS  . clonazePAM  0.5 mg Oral TID  . enoxaparin (LOVENOX) injection  0.5 mg/kg Subcutaneous Q24H  . furosemide  20 mg Intravenous Once  . insulin aspart  0-5 Units Subcutaneous QHS  . insulin aspart  0-9 Units Subcutaneous TID WC  . levothyroxine  225 mcg Oral Daily  . pantoprazole  40 mg Oral Daily  . propranolol  20 mg Oral TID  . QUEtiapine  600 mg Oral QHS  . sodium chloride flush  3 mL Intravenous Q12H  . torsemide  40 mg Oral Daily   Continuous Infusions: . sodium chloride     PRN Meds: sodium chloride, acetaminophen, albuterol, dextromethorphan-guaiFENesin, nitroGLYCERIN, ondansetron (ZOFRAN) IV, sodium chloride flush, traMADol, zolpidem   Vital Signs    Vitals:   03/16/20 2035 03/17/20 0100 03/17/20 0439 03/17/20 0759  BP: 115/67  109/73 118/80  Pulse: 67  63 63  Resp: 18  17 20   Temp: 98.5 F (36.9 C)  98.4 F (36.9 C) 97.7 F (36.5 C)  TempSrc: Oral   Oral  SpO2: 94%  99% 99%  Weight:  (!) 162 kg    Height:        Intake/Output Summary (Last 24 hours) at 03/17/2020 1049 Last data filed at 03/17/2020 1004 Gross per 24 hour  Intake 700 ml  Output 2500 ml  Net -1800 ml   Last 3 Weights 03/17/2020 03/16/2020 03/15/2020  Weight (lbs) 357 lb  3.2 oz 356 lb 7.7 oz 351 lb 6.4 oz  Weight (kg) 162.025 kg 161.7 kg 159.394 kg      Telemetry    NSR- Personally Reviewed  ECG    No new tracings- Personally Reviewed  Physical Exam   GEN: No acute distress.  Obese female.  Seated in chair next to bed and watching television. Neck:  JVD difficult to assess due to body habitus. Cardiac: RRR, no murmurs, rubs, or gallops.  Respiratory:  Distant breath sounds.  Clear to auscultation bilaterally. GI: Soft, nontender, non-distended  MS: No edema, bilateral skin changes noted of lower extremities; No deformity. Neuro:  Nonfocal  Psych: Normal affect   Labs    High Sensitivity Troponin:   Recent Labs  Lab 02/22/20 1309 03/03/20 1043 03/11/20 0803 03/11/20 0951 03/11/20 1725  TROPONINIHS 5 4 3 3 4       Chemistry Recent Labs  Lab 03/15/20 0749 03/16/20 0549 03/17/20 0446  NA 136 137 135  K 5.2* 3.9 3.5  CL 90* 90* 88*  CO2 33* 35* 35*  GLUCOSE 109* 121* 120*  BUN 23* 28* 24*  CREATININE 0.77 0.79 0.70  CALCIUM 8.5* 9.0 9.2  GFRNONAA >60 >60 >60  ANIONGAP 13 12 12      Hematology Recent  Labs  Lab 03/11/20 0803 03/12/20 0431 03/15/20 0749  WBC 9.6 7.2 10.3  RBC 3.92 3.98 4.08  HGB 10.9* 11.3* 11.4*  HCT 36.9 37.3 37.1  MCV 94.1 93.7 90.9  MCH 27.8 28.4 27.9  MCHC 29.5* 30.3 30.7  RDW 17.2* 17.2* 17.0*  PLT 307 313 243    BNP Recent Labs  Lab 03/11/20 0803  BNP 30.7     DDimer No results for input(s): DDIMER in the last 168 hours.   Radiology    No results found.  Cardiac Studies  LHC 03/13/20 Conclusions: 1. No angiographically significant coronary artery disease. 2. Severely elevated left ventricular filling pressure (LVEFD ~40 mmHg at end-expiration) with marked respiratory variation. 3. Grossly normal left ventricular contraction. Recommendations: 1. Escalate diuresis for treatment of acute HFpEF. 2. Consider workup for other etiologies of chest pain per hospitalist service if chest  pain persists. 3. Primary prevention of coronary artery disease.  Echo 03/12/20 1. Left ventricular ejection fraction, by estimation, is 55 to 60%. The  left ventricle has normal function. The left ventricle has no regional  wall motion abnormalities. Left ventricular diastolic parameters were  normal.  2. Right ventricular systolic function is normal. The right ventricular  size is normal. Tricuspid regurgitation signal is inadequate for assessing  PA pressure.  3. The mitral valve is normal in structure. No evidence of mitral valve  regurgitation. No evidence of mitral stenosis.  4. The aortic valve is normal in structure. Aortic valve regurgitation is  not visualized. No aortic stenosis is present.   Patient Profile     50 y.o. female with history of schizoaffective disorder, bipolar disorder, fibromyalgia, anxiety, DM2, hypertension, hyperlipidemia, anemia, hypothyroidism, history of DVT in 2004 following pregnancy, lymphedema, prior BZD and opiate abuse, and obesity, and who is being seen today for chest pain and diastolic CHF s/p recent 03/13/2020 LHC that showed no significant CAD but severely elevated LVEDP.  Assessment & Plan    Chest pain, atypical --Reports ongoing atypical chest pain that radiates to her back and has been ongoing since 3 AM.  S/p LHC as above without significant CAD.  Echo 2/18 with normal LV SF.  As previously noted, the etiology of her symptoms is unclear and possibly from volume overload or alternative diagnoses/noncardiac etiology.  No indication for IV heparin.  No further ischemic work-up this admission.  Recommend consider other etiologies of chest pain per hospitalist service.  Primary prevention of CAD recommended, including dietary changes and increased activity.  Weight loss recommended.  Chronic diastolic CHF --Reports chest pain makes breathing difficult.  She states she feels volume up today when compared with her report from yesterday.    Significantly elevated LVEDP by [facility] 2/15.   --Dc'd IV diuresis yesterday (2/18) after labs showed bump in renal function.  Last received IV lasix 40mg  AM of yesterday (2/18).   Started PO Torsemide 40 mg daily this AM (2/19) at 9:30AM.   --Discussed case with IM today, given hospitalist concern of volume status elevation since yesterday 2/18 and improved Cr overnight. Yesterday, pt was euvolemic with recommendation to discharge home on torsemide 40mg  qd.  --On exam, volume status still difficult to discern this AM 2/2 body habitus and poorly recorded I/Os.   Continue oral diuresis with close monitoring of renal function  Ordered a one time dose of IV lasix 20mg  x1, given improved Cr and increased intake of fluid overnight / this AM with report of worsening sx this AM of overload. Notified rounding  cardiologist of this plan.  Continue po torsemide 40mg  daily.  Strict I's/O's.   Several drinks in front of her.  She denies any output on oral diuresis earlier this AM.  No I/Os recorded today.   Daily standing weights.    Wt 161.7kg  162Kg.   Daily BMET.   Cr 0.79  0.70 with BUN 28  24.  Palpitations --Chronic issue for the patient.  No evidence of arrhythmia on telemetry.  Zio monitor ordered 03/01/2020 on review of EMR.  HTN --BP overall well controlled.  Most recent DBP borderline at 80.  Continue current medications.  Psychiatric history --Management per primary team/PCP.  Continue Seroquel.  Abnormal TSH --TSH 28 this admission.  Started on levothyroxine.  Recommend follow-up with PCP/endocrinology as indicated by primary team.   For questions or updates, please contact [facility] Please consult [email or web address] for contact info under        Signed, 04/29/2020, PA-C  03/17/2020, 10:49 AM

## 2020-03-18 DIAGNOSIS — I5033 Acute on chronic diastolic (congestive) heart failure: Secondary | ICD-10-CM | POA: Diagnosis not present

## 2020-03-18 LAB — BASIC METABOLIC PANEL
Anion gap: 9 (ref 5–15)
BUN: 22 mg/dL — ABNORMAL HIGH (ref 6–20)
CO2: 37 mmol/L — ABNORMAL HIGH (ref 22–32)
Calcium: 9 mg/dL (ref 8.9–10.3)
Chloride: 89 mmol/L — ABNORMAL LOW (ref 98–111)
Creatinine, Ser: 0.69 mg/dL (ref 0.44–1.00)
GFR, Estimated: >60 mL/min (ref >60)
Glucose, Bld: 102 mg/dL — ABNORMAL HIGH (ref 70–99)
Potassium: 3.5 mmol/L (ref 3.5–5.1)
Sodium: 135 mmol/L (ref 135–145)

## 2020-03-18 LAB — GLUCOSE, CAPILLARY
Glucose-Capillary: 151 mg/dL — ABNORMAL HIGH (ref 70–99)
Glucose-Capillary: 111 mg/dL — ABNORMAL HIGH (ref 70–99)

## 2020-03-18 MED ORDER — TORSEMIDE 40 MG PO TABS: 40.0000 mg | ORAL_TABLET | Freq: Every day | ORAL | 3 refills | Status: DC

## 2020-03-18 MED ORDER — TRAMADOL HCL 50 MG PO TABS: 50.0000 mg | ORAL_TABLET | Freq: Four times a day (QID) | ORAL | 0 refills | Status: DC | PRN

## 2020-03-18 NOTE — Progress Notes (Signed)
Progress Note  Patient Name: Rhonda Palmer Date of Encounter: 03/18/2020  Primary Cardiologist: Debbe Odea, MD   Subjective   Some improvement in his chest pain.  No shortness of breath.  Inpatient Medications    Scheduled Meds: . atorvastatin  80 mg Oral QHS  . clonazePAM  0.5 mg Oral TID  . enoxaparin (LOVENOX) injection  0.5 mg/kg Subcutaneous Q24H  . insulin aspart  0-5 Units Subcutaneous QHS  . insulin aspart  0-9 Units Subcutaneous TID WC  . levothyroxine  225 mcg Oral Daily  . pantoprazole  40 mg Oral Daily  . propranolol  20 mg Oral TID  . QUEtiapine  600 mg Oral QHS  . sodium chloride flush  3 mL Intravenous Q12H  . torsemide  40 mg Oral Daily   Continuous Infusions: . sodium chloride     PRN Meds: sodium chloride, acetaminophen, albuterol, dextromethorphan-guaiFENesin, nitroGLYCERIN, ondansetron (ZOFRAN) IV, sodium chloride flush, traMADol, zolpidem   Vital Signs    Vitals:   03/18/20 0016 03/18/20 0100 03/18/20 0421 03/18/20 0815  BP:   112/78 (!) 137/92  Pulse: 62  62 69  Resp:   18 18  Temp:   97.9 F (36.6 C) 98 F (36.7 C)  TempSrc:      SpO2: 91%  92% 98%  Weight:  (!) 160.9 kg    Height:        Intake/Output Summary (Last 24 hours) at 03/18/2020 1123 Last data filed at 03/18/2020 0500 Gross per 24 hour  Intake 720 ml  Output 3480 ml  Net -2760 ml   Last 3 Weights 03/18/2020 03/17/2020 03/16/2020  Weight (lbs) 354 lb 11.2 oz 357 lb 3.2 oz 356 lb 7.7 oz  Weight (kg) 160.891 kg 162.025 kg 161.7 kg      Telemetry    NSR- Personally Reviewed  ECG    No new tracings- Personally Reviewed  Physical Exam   GEN: No acute distress.  Obese female.  Seated in chair next to bed and watching television. Neck:  JVD difficult to assess due to body habitus. Cardiac: RRR, no murmurs, rubs, or gallops.  Respiratory:  Distant breath sounds.  Clear to auscultation bilaterally. GI: Soft, nontender, non-distended  MS: No edema, bilateral  skin changes noted of lower extremities; No deformity. Neuro:  Nonfocal  Psych: Normal affect   Labs    High Sensitivity Troponin:   Recent Labs  Lab 02/22/20 1309 03/03/20 1043 03/11/20 0803 03/11/20 0951 03/11/20 1725  TROPONINIHS 5 4 3 3 4       Chemistry Recent Labs  Lab 03/16/20 0549 03/17/20 0446 03/18/20 0538  NA 137 135 135  K 3.9 3.5 3.5  CL 90* 88* 89*  CO2 35* 35* 37*  GLUCOSE 121* 120* 102*  BUN 28* 24* 22*  CREATININE 0.79 0.70 0.69  CALCIUM 9.0 9.2 9.0  GFRNONAA >60 >60 >60  ANIONGAP 12 12 9      Hematology Recent Labs  Lab 03/12/20 0431 03/15/20 0749  WBC 7.2 10.3  RBC 3.98 4.08  HGB 11.3* 11.4*  HCT 37.3 37.1  MCV 93.7 90.9  MCH 28.4 27.9  MCHC 30.3 30.7  RDW 17.2* 17.0*  PLT 313 243    BNP No results for input(s): BNP, PROBNP in the last 168 hours.   DDimer No results for input(s): DDIMER in the last 168 hours.   Radiology    No results found.  Cardiac Studies  LHC 03/13/20 Conclusions: 1. No angiographically significant coronary artery disease. 2.  Severely elevated left ventricular filling pressure (LVEFD ~40 mmHg at end-expiration) with marked respiratory variation. 3. Grossly normal left ventricular contraction. Recommendations: 1. Escalate diuresis for treatment of acute HFpEF. 2. Consider workup for other etiologies of chest pain per hospitalist service if chest pain persists. 3. Primary prevention of coronary artery disease.  Echo 03/12/20 1. Left ventricular ejection fraction, by estimation, is 55 to 60%. The  left ventricle has normal function. The left ventricle has no regional  wall motion abnormalities. Left ventricular diastolic parameters were  normal.  2. Right ventricular systolic function is normal. The right ventricular  size is normal. Tricuspid regurgitation signal is inadequate for assessing  PA pressure.  3. The mitral valve is normal in structure. No evidence of mitral valve  regurgitation. No  evidence of mitral stenosis.  4. The aortic valve is normal in structure. Aortic valve regurgitation is  not visualized. No aortic stenosis is present.   Patient Profile     50 y.o. female with history of schizoaffective disorder, bipolar disorder, fibromyalgia, anxiety, DM2, hypertension, hyperlipidemia, anemia, hypothyroidism, history of DVT in 2004 following pregnancy, lymphedema, prior BZD and opiate abuse, and obesity, and who is being seen today for chest pain and diastolic CHF s/p recent 03/13/2020 LHC that showed no significant CAD but severely elevated LVEDP.  Assessment & Plan    Chest pain, atypical --Cardiac catheterization during this admission showed no evidence of obstructive coronary artery disease.  Acute on chronic diastolic CHF: the patient's LVEDP was significantly elevated during cardiac catheterization and since then she received intermittent doses of IV diuresis.  In addition, I increased her torsemide to 40 mg once daily.  Her CO2 has gradually increased from 27-37 indicating some degree of contraction alkalosis.  It might be best to diuresis gradually as an outpatient.  She can be discharged on 40 mg of torsemide daily.  Palpitations --Chronic issue for the patient.  No evidence of arrhythmia on telemetry.  Zio monitor ordered 03/01/2020 on review of EMR.  HTN --BP overall well controlled.  Most recent DBP borderline at 80.  Continue current medications.  Psychiatric history --Management per primary team/PCP.  Continue Seroquel.   The patient can be discharged home from a cardiac standpoint.  She has a follow-up appointment in the heart failure clinic on February 24 and in our office on March 17.   For questions or updates, please contact [facility] Please consult [email or web address] for contact info under        Signed, Lorine Bears, MD  03/18/2020, 11:23 AM

## 2020-03-18 NOTE — Discharge Summary (Signed)
Physician Discharge Summary  Rhonda Palmer AST:[id number] DOB: 04-26-1970 DOA: 03/11/2020  Rhonda: Rhonda Jack, FNP  Admit date: 03/11/2020 Discharge date: 03/18/2020  Admitted From: Home  Disposition:  Home with [facility]   Recommendations for Outpatient Follow-up:  1. Follow up with Rhonda Palmer in 1-2 weeks 2. Rhonda Palmer: Please ensure patient obtains CPAP referral as SOON as possible 3. Rhonda Palmer: Please increase Levothyroxine dose given elevated TSH and repeat in 2-3 months 4. Folow up with Rhonda Palmer, CHF Clinic on Thursday 5. Rhonda Palmer: Please obtain BMP on Thursday      Home Health: PT/OT/RN/Aide due to ongoing SOB with exertion  Equipment/Devices: Walker, 3-in-1  Discharge Condition: Fair  CODE STATUS: FULL Diet recommendation: Low sodium  Brief/Interim Summary: Rhonda Palmer is a 50 y.o. 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  Evidently, these chest pains had been progressive, intermittent, and non-exertional.  She was being worked up as an outpatient by Cardiology, but her symptoms worsened and she came to the ER.  The pains were intermittent, non-exertional, not improved with NTG, aspirin or ibuprofen.  Worse with lying flat.    In the ER, slightly hypoxic.  Recent CTA in context of these symptoms was limited by habitus but no central PE.     PRINCIPAL HOSPITAL DIAGNOSIS: Acute on chronic diastolic CHF    Discharge Diagnoses:   Acute on chronic diastolic CHF Patient admitted for chest pain.  CTA without PE or pneumonia.    Taken to cath lab which showed no coronary obstructions but extremely high LVEDP consistent with CHF and OHS.    Started on diuretics and diuresed 7L.  Bicarb and BUN rising and so diuresis transitioned to oral torsemide on which she made good urine output.     Continue torsemide 40 daily. Recommended daily weights. Will follow up with Heart Failure clinic in 4 days.   OSA Important that patient get referral for polysomnogram or CPAP as soon as able.  Diabetes  Hypothyoidism TSH elevated  Schizoaffective disorder, Bipolar Continue Seroquel, clonazepam and long-acting Abilify  Morbid obesity BMI 60  Mild intermittent asthma No evidence of flare  Normocytic anemia Hgb stable, no clinical bleeding                 Discharge Instructions  Discharge Instructions    Diet - low sodium heart healthy   Complete by: As directed    Discharge instructions   Complete by: As directed    From Dr. Maryfrances Bunnell: You were admitted with chest symptoms, which we found were related to congestive heart failure  You were treated with Diuretics and should follow closely with the heart failure clinic  CHANGE your torsemide: take torsemide 40 mg once daily Follow up with Rhonda Palmer and the heart failure clinic on Thursday Have your labs checked on Thursday  STOP taking losartan for now Resume it only if Rhonda Palmer recommends  Eat a LOW SODIUM diet Consume NO MORE THAN per day of fluids (this means only 4 twelve ounce cans or 3 sixteen ounce bottles of fluids per day MAX)   Do NOT take ibuprofen or other NSAID pain relievers, these are bad for the heart  Call your primary care doctor on Monday to schedule a follow up appointment and make sure you are scheduled to get your CPAP  Resume your other home  medicines without change   Increase activity slowly   Complete by: As directed      Allergies as of 03/18/2020      Reactions   Codeine Hives, Itching, Rash, Swelling   Sulfa Antibiotics Anaphylaxis   Penicillins Itching      Medication List    STOP taking these medications   IBU 800 MG tablet Generic drug: ibuprofen   losartan 100 MG tablet Commonly known as: COZAAR   Zoloft 50 MG tablet Generic drug:  sertraline     TAKE these medications   albuterol 108 (90 Base) MCG/ACT inhaler Commonly known as: VENTOLIN HFA Inhale 2 puffs into the lungs every 6 (six) hours as needed for wheezing or shortness of breath.   ARIPiprazole ER 300 MG Srer injection Commonly known as: ABILIFY MAINTENA Inject 300 mg into the muscle every 28 (twenty-eight) days.   [place]ristada Initio 675 MG/2.4ML prefilled syringe Generic drug: ARIPiprazole lauroxil ER Inject 2.4 ml intramuscularly single dose   atorvastatin 80 MG tablet Commonly known as: LIPITOR Take 80 mg by mouth at bedtime.   clonazePAM 0.5 MG tablet Commonly known as: KLONOPIN Take 0.5 mg by mouth 3 (three) times daily.   glimepiride 4 MG tablet Commonly known as: AMARYL Take 4 mg by mouth daily.   Levothyroxine Sodium 200 MCG Caps Take 200 mcg by mouth daily.   metFORMIN 500 MG tablet Commonly known as: GLUCOPHAGE Take 500 mg by mouth 2 (two) times daily with a meal.   nitroGLYCERIN 0.4 MG SL tablet Commonly known as: NITROSTAT Place 1 tablet (0.4 mg total) under the tongue every 5 (five) minutes as needed for chest pain.   ondansetron 4 MG tablet Commonly known as: ZOFRAN Take 4 mg by mouth every 8 (eight) hours as needed.   pantoprazole 40 MG tablet Commonly known as: PROTONIX Take 40 mg by mouth daily.   propranolol 20 MG tablet Commonly known as: INDERAL Take 20 mg by mouth 3 (three) times daily.   QUEtiapine 300 MG tablet Commonly known as: SEROQUEL Take 600 mg by mouth at bedtime.   Torsemide 40 MG Tabs Take 40 mg by mouth daily. What changed:   medication strength  how much to take   zolpidem 5 MG tablet Commonly known as: AMBIEN Take 5 mg by mouth at bedtime as needed.            Durable Medical Equipment  (From admission, onward)         Start     Ordered   03/18/20 1144  DME Walker  Once       Question Answer Comment  Walker: With 5 Inch Wheels   Patient needs a walker to treat with the  following condition CHF (congestive heart failure) (HCC)      03/18/20 1147   03/18/20 1144  DME 3-in-1  Once       Comments: Bariatric   03/18/20 1147          Follow-up Information    Rhonda Jack, FNP. Schedule an appointment as soon as possible for a visit in 1 week(s).   Specialty: Family Medicine Contact information: [address] [place] [address] [phone]        Rhonda Freeze, FNP. Go in 4 day(s).   Specialty: Family Medicine Why: You have an appointment with Rhonda Palmer at the [facility] Heart Failure clinic on Thursday 2/24 at 3:00PM Contact information: [address] [place] [address] [phone]  Allergies  Allergen Reactions  . Codeine Hives, Itching, Rash and Swelling  . Sulfa Antibiotics Anaphylaxis  . Penicillins Itching    Consultations:  Cardiology   Procedures/Studies: DG Chest 2 View  Result Date: 03/11/2020 CLINICAL DATA:  Chest pain EXAM: CHEST - 2 VIEW COMPARISON:  March 03, 2020 chest radiograph and chest CT FINDINGS: There is no edema or airspace opacity. Heart size and pulmonary vascularity are within normal limits. Postoperative change noted on the right with missing anterior right second rib. Monitor device noted on the left. No pneumothorax. IMPRESSION: Postoperative change on the right. No edema or airspace opacity. Stable cardiac silhouette. Electronically Signed   By: Bretta BangWilliam  Woodruff III M.D.   On: 03/11/2020 08:28   DG Chest 2 View  Result Date: 03/03/2020 CLINICAL DATA:  Chest pain and shortness of breath EXAM: CHEST - 2 VIEW COMPARISON:  02/22/2020 FINDINGS: Postoperative right chest wall with lucency from missing anterior rib. New implant over the left chest. Low lung volumes. There is no edema, consolidation, effusion, or pneumothorax. No acute osseous finding. IMPRESSION: Stable exam.  No evidence of active disease. Electronically Signed   By: Marnee SpringJonathon  Watts M.D.   On:  03/03/2020 11:07   DG Chest 2 View  Result Date: 02/22/2020 CLINICAL DATA:  Chest pain/DIS of breath EXAM: CHEST - 2 VIEW COMPARISON:  January 23, 2019 FINDINGS: Postoperative changes noted on the right, stable. Apparent bulla right mid lung medially. There is no appreciable edema or airspace opacity. The heart size and pulmonary vascularity are normal. No adenopathy. No bone lesions. IMPRESSION: Postoperative change on the right. Apparent bulla medial right mid lung, stable. No edema or airspace opacity. Stable cardiac silhouette. Electronically Signed   By: Bretta BangWilliam  Woodruff III M.D.   On: 02/22/2020 08:57   CT Angio Chest PE W and/or Wo Contrast  Result Date: 03/03/2020 CLINICAL DATA:  Chest discomfort for 2 days EXAM: CT ANGIOGRAPHY CHEST WITH CONTRAST TECHNIQUE: Multidetector CT imaging of the chest was performed using the standard protocol during bolus administration of intravenous contrast. Multiplanar CT image reconstructions and MIPs were obtained to evaluate the vascular anatomy. CONTRAST:  75mL OMNIPAQUE IOHEXOL 350 MG/ML SOLN COMPARISON:  February 12, 2018 FINDINGS: Cardiovascular: Evaluation is limited secondary to body habitus, respiratory motion and suboptimal contrast timing. Within these limitations, no pulmonary embolism through the proximal segmental pulmonary arteries. Heart is mildly enlarged. No pericardial effusion. Aorta is normal in course and caliber with the LEFT vertebral artery arising directly from the aorta. Mediastinum/Nodes: There are prominent subpectoral lymph nodes with representative LEFT level 2 subpectoral lymph node measuring 10 mm in the short axis, previously 14 mm (series 4, image 23). Thyroid is normal. Pretracheal lymph node measures 12 mm in the short axis, previously 16 mm (series 4, image 25). Lungs/Pleura: No pleural effusion or pneumothorax. No suspicious pulmonary nodule. Upper Abdomen: Status post cholecystectomy with unchanged dilated common bile duct.  Musculoskeletal: Postsurgical changes of the RIGHT anterior chest wall with partial resection of the RIGHT second anterior rib. Review of the MIP images confirms the above findings. IMPRESSION: 1. Evaluation is limited secondary to body habitus, respiratory motion and suboptimal contrast timing. Within these limitations, no pulmonary embolism through the proximal segmental pulmonary arteries. If persistent concern, consider dedicated V/Q scan. 2. Interval decrease in size of previously described mediastinal and axillary lymph nodes. Electronically Signed   By: Meda KlinefelterStephanie  Peacock MD   On: 03/03/2020 12:47   CARDIAC CATHETERIZATION  Result Date: 03/13/2020 Conclusions: 1. No angiographically  significant coronary artery disease. 2. Severely elevated left ventricular filling pressure (LVEFD ~40 mmHg at end-expiration) with marked respiratory variation. 3. Grossly normal left ventricular contraction. Recommendations: 1. Escalate diuresis for treatment of acute HFpEF. 2. Consider workup for other etiologies of chest pain per hospitalist service if chest pain persists. 3. Primary prevention of coronary artery disease. Yvonne Kendall, MD [facility]   ECHOCARDIOGRAM COMPLETE  Result Date: 03/12/2020    ECHOCARDIOGRAM REPORT   Patient Name:   AALYSSA ELDERKIN Date of Exam: 03/12/2020 Medical Rec #:  [id number]        Height:       61.0 in Accession #:    [id number]       Weight:       351.8 lb Date of Birth:  1970/06/04        BSA:          2.401 m Patient Age:    49 years         BP:           112/79 mmHg Patient Gender: F                HR:           77 bpm. Exam Location:  [facility] Procedure: 2D Echo, Color Doppler, Cardiac Doppler and Intracardiac            Opacification Agent Indications:     R07.9 Chest Pain  History:         Patient has no prior history of Echocardiogram examinations.                  Risk Factors:Hypertension and Diabetes.  Sonographer:     Humphrey Rolls RDCS (AE) Referring Phys:  [id number] Sondra Barges Diagnosing Phys: Lorine Bears MD  Sonographer Comments: Technically difficult study due to poor echo windows. Image acquisition challenging due to patient body habitus. IMPRESSIONS  1. Left ventricular ejection fraction, by estimation, is 55 to 60%. The left ventricle has normal function. The left ventricle has no regional wall motion abnormalities. Left ventricular diastolic parameters were normal.  2. Right ventricular systolic function is normal. The right ventricular size is normal. Tricuspid regurgitation signal is inadequate for assessing PA pressure.  3. The mitral valve is normal in structure. No evidence of mitral valve regurgitation. No evidence of mitral stenosis.  4. The aortic valve is normal in structure. Aortic valve regurgitation is not visualized. No aortic stenosis is present. FINDINGS  Left Ventricle: Left ventricular ejection fraction, by estimation, is 55 to 60%. The left ventricle has normal function. The left ventricle has no regional wall motion abnormalities. Definity contrast agent was given IV to delineate the left ventricular  endocardial borders. The left ventricular internal cavity size was normal in size. There is no left ventricular hypertrophy. Left ventricular diastolic parameters were normal. Right Ventricle: The right ventricular size is normal. No increase in right ventricular wall thickness. Right ventricular systolic function is normal. Tricuspid regurgitation signal is inadequate for assessing PA pressure. Left Atrium: Left atrial size was normal in size. Right Atrium: Right atrial size was normal in size. Pericardium: There is no evidence of pericardial effusion. Mitral Valve: The mitral valve is normal in structure. No evidence of mitral valve regurgitation. No evidence of mitral valve stenosis. MV peak gradient, 3.1 mmHg. The mean mitral valve gradient is 1.0 mmHg. Tricuspid Valve: The tricuspid valve is normal in structure. Tricuspid valve regurgitation is not  demonstrated. No evidence of  tricuspid stenosis. Aortic Valve: The aortic valve is normal in structure. Aortic valve regurgitation is not visualized. No aortic stenosis is present. Aortic valve mean gradient measures 2.0 mmHg. Aortic valve peak gradient measures 4.2 mmHg. Aortic valve area, by VTI measures 3.39 cm. Pulmonic Valve: The pulmonic valve was normal in structure. Pulmonic valve regurgitation is not visualized. No evidence of pulmonic stenosis. Aorta: The aortic root is normal in size and structure. Venous: The inferior vena cava was not well visualized. IAS/Shunts: No atrial level shunt detected by color flow Doppler.  LEFT VENTRICLE PLAX 2D LVIDd:         4.60 cm  Diastology LVIDs:         3.40 cm  LV e' medial:    9.14 cm/s LV PW:         0.90 cm  LV E/e' medial:  7.9 LV IVS:        1.10 cm  LV e' lateral:   7.51 cm/s LVOT diam:     2.20 cm  LV E/e' lateral: 9.6 LV SV:         54 LV SV Index:   22 LVOT Area:     3.80 cm  LEFT ATRIUM         Index LA diam:    3.50 cm 1.46 cm/m  AORTIC VALVE                   PULMONIC VALVE AV Area (Vmax):    3.32 cm    PV Vmax:       1.29 m/s AV Area (Vmean):   3.35 cm    PV Vmean:      90.300 cm/s AV Area (VTI):     3.39 cm    PV VTI:        0.199 m AV Vmax:           103.00 cm/s PV Peak grad:  6.7 mmHg AV Vmean:          72.100 cm/s PV Mean grad:  4.0 mmHg AV VTI:            0.159 m AV Peak Grad:      4.2 mmHg AV Mean Grad:      2.0 mmHg LVOT Vmax:         90.00 cm/s LVOT Vmean:        63.500 cm/s LVOT VTI:          0.142 m LVOT/AV VTI ratio: 0.89  AORTA Ao Root diam: 3.00 cm MITRAL VALVE MV Area (PHT): 3.60 cm    SHUNTS MV Area VTI:   2.58 cm    Systemic VTI:  0.14 m MV Peak grad:  3.1 mmHg    Systemic Diam: 2.20 cm MV Mean grad:  1.0 mmHg MV Vmax:       0.89 m/s MV Vmean:      52.0 cm/s MV Decel Time: 211 msec MV E velocity: 72.00 cm/s MV A velocity: 62.10 cm/s MV E/A ratio:  1.16 Lorine Bears MD Electronically signed by Lorine Bears MD Signature  Date/Time: 03/12/2020/12:31:31 PM    Final        Subjective: Feels still some chest heaviness improved with tramadol.  No confusion, no other new symptoms.  Discharge Exam: Vitals:   03/18/20 0421 03/18/20 0815  BP: 112/78 (!) 137/92  Pulse: 62 69  Resp: 18 18  Temp: 97.9 F (36.6 C) 98 F (36.7 C)  SpO2: 92% 98%   Vitals:  03/18/20 0016 03/18/20 0100 03/18/20 0421 03/18/20 0815  BP:   112/78 (!) 137/92  Pulse: 62  62 69  Resp:   18 18  Temp:   97.9 F (36.6 C) 98 F (36.7 C)  TempSrc:      SpO2: 91%  92% 98%  Weight:  (!) 160.9 kg    Height:        General: Pt is alert, awake, not in acute distress, morbidly obese, lying in bed Cardiovascular: RRR, nl S1-S2, no murmurs appreciated.   No LE edema.   Respiratory: Normal respiratory rate and rhythm.  CTAB without rales or wheezes. Abdominal: Abdomen soft and non-tender.  No distension or HSM.   Neuro/Psych: Strength symmetric in upper and lower extremities.  Judgment and insight appear normal.   The results of significant diagnostics from this hospitalization (including imaging, microbiology, ancillary and laboratory) are listed below for reference.     Microbiology: Recent Results (from the past 240 hour(s))  Resp Panel by RT-PCR (Flu A&B, Covid) Nasopharyngeal Swab     Status: None   Collection Time: 03/11/20  8:46 AM   Specimen: Nasopharyngeal Swab; Nasopharyngeal(NP) swabs in vial transport medium  Result Value Ref Range Status   SARS Coronavirus 2 by RT PCR NEGATIVE NEGATIVE Final    Comment: (NOTE) SARS-CoV-2 target nucleic acids are NOT DETECTED.  The SARS-CoV-2 RNA is generally detectable in upper respiratory specimens during the acute phase of infection. The lowest concentration of SARS-CoV-2 viral copies this assay can detect is 138 copies/mL. A negative result does not preclude SARS-Cov-2 infection and should not be used as the sole basis for treatment or other patient management decisions. A  negative result may occur with  improper specimen collection/handling, submission of specimen other than nasopharyngeal swab, presence of viral mutation(s) within the areas targeted by this assay, and inadequate number of viral copies(<138 copies/mL). A negative result must be combined with clinical observations, patient history, and epidemiological information. The expected result is Negative.  Fact Sheet for Patients:  BloggerCourse.com  Fact Sheet for Healthcare Providers:  SeriousBroker.it  This test is no t yet approved or cleared by the [place] FDA and  has been authorized for detection and/or diagnosis of SARS-CoV-2 by FDA under an Emergency Use Authorization (EUA). This EUA will remain  in effect (meaning this test can be used) for the duration of the COVID-19 declaration under Section 564(b)(1) of the Act, 21 U.S.C.section 360bbb-3(b)(1), unless the authorization is terminated  or revoked sooner.       Influenza A by PCR NEGATIVE NEGATIVE Final   Influenza B by PCR NEGATIVE NEGATIVE Final    Comment: (NOTE) The Xpert Xpress SARS-CoV-2/FLU/RSV plus assay is intended as an aid in the diagnosis of influenza from Nasopharyngeal swab specimens and should not be used as a sole basis for treatment. Nasal washings and aspirates are unacceptable for Xpert Xpress SARS-CoV-2/FLU/RSV testing.  Fact Sheet for Patients: BloggerCourse.com  Fact Sheet for Healthcare Providers: SeriousBroker.it  This test is not yet approved or cleared by the [place] FDA and has been authorized for detection and/or diagnosis of SARS-CoV-2 by FDA under an Emergency Use Authorization (EUA). This EUA will remain in effect (meaning this test can be used) for the duration of the COVID-19 declaration under Section 564(b)(1) of the Act, 21 U.S.C. section 360bbb-3(b)(1), unless the authorization  is terminated or revoked.  Performed at [facility], [address] [place], [place] [address]      Labs: BNP (last  3 results) Recent Labs    03/11/20 0803  BNP 30.7   Basic Metabolic Panel: Recent Labs  Lab 03/12/20 0431 03/14/20 0909 03/15/20 0749 03/16/20 0549 03/17/20 0446 03/18/20 0538  NA  --  137 136 137 135 135  K  --  4.0 5.2* 3.9 3.5 3.5  CL  --  93* 90* 90* 88* 89*  CO2  --  32 33* 35* 35* 37*  GLUCOSE  --  113* 109* 121* 120* 102*  BUN  --  22* 23* 28* 24* 22*  CREATININE  --  0.66 0.77 0.79 0.70 0.69  CALCIUM  --  8.6* 8.5* 9.0 9.2 9.0  MG 2.3  --   --   --   --   --    Liver Function Tests: No results for input(s): AST, ALT, ALKPHOS, BILITOT, PROT, ALBUMIN in the last 168 hours. No results for input(s): LIPASE, AMYLASE in the last 168 hours. No results for input(s): AMMONIA in the last 168 hours. CBC: Recent Labs  Lab 03/12/20 0431 03/15/20 0749  WBC 7.2 10.3  HGB 11.3* 11.4*  HCT 37.3 37.1  MCV 93.7 90.9  PLT 313 243   Cardiac Enzymes: No results for input(s): CKTOTAL, CKMB, CKMBINDEX, TROPONINI in the last 168 hours. BNP: Invalid input(s): POCBNP CBG: Recent Labs  Lab 03/17/20 0759 03/17/20 1137 03/17/20 1605 03/17/20 2037 03/18/20 0818  GLUCAP 101* 131* 136* 147* 151*   D-Dimer No results for input(s): DDIMER in the last 72 hours. Hgb A1c No results for input(s): HGBA1C in the last 72 hours. Lipid Profile No results for input(s): CHOL, HDL, LDLCALC, TRIG, CHOLHDL, LDLDIRECT in the last 72 hours. Thyroid function studies No results for input(s): TSH, T4TOTAL, T3FREE, THYROIDAB in the last 72 hours.  Invalid input(s): FREET3 Anemia work up No results for input(s): VITAMINB12, FOLATE, FERRITIN, TIBC, IRON, RETICCTPCT in the last 72 hours. Urinalysis    Component Value Date/Time   COLORURINE YELLOW (A) 01/23/2019 0853   APPEARANCEUR CLOUDY (A) 01/23/2019 0853   APPEARANCEUR Clear 04/29/2017 0918   LABSPEC 1.023  01/23/2019 0853   PHURINE 6.0 01/23/2019 0853   GLUCOSEU NEGATIVE 01/23/2019 0853   HGBUR NEGATIVE 01/23/2019 0853   BILIRUBINUR NEGATIVE 01/23/2019 0853   BILIRUBINUR Negative 04/29/2017 0918   KETONESUR NEGATIVE 01/23/2019 0853   PROTEINUR NEGATIVE 01/23/2019 0853   NITRITE NEGATIVE 01/23/2019 0853   LEUKOCYTESUR TRACE (A) 01/23/2019 0853   Sepsis Labs Invalid input(s): PROCALCITONIN,  WBC,  LACTICIDVEN Microbiology Recent Results (from the past 240 hour(s))  Resp Panel by RT-PCR (Flu A&B, Covid) Nasopharyngeal Swab     Status: None   Collection Time: 03/11/20  8:46 AM   Specimen: Nasopharyngeal Swab; Nasopharyngeal(NP) swabs in vial transport medium  Result Value Ref Range Status   SARS Coronavirus 2 by RT PCR NEGATIVE NEGATIVE Final    Comment: (NOTE) SARS-CoV-2 target nucleic acids are NOT DETECTED.  The SARS-CoV-2 RNA is generally detectable in upper respiratory specimens during the acute phase of infection. The lowest concentration of SARS-CoV-2 viral copies this assay can detect is 138 copies/mL. A negative result does not preclude SARS-Cov-2 infection and should not be used as the sole basis for treatment or other patient management decisions. A negative result may occur with  improper specimen collection/handling, submission of specimen other than nasopharyngeal swab, presence of viral mutation(s) within the areas targeted by this assay, and inadequate number of viral copies(<138 copies/mL). A negative result must be combined with clinical observations, patient history, and epidemiological information.  The expected result is Negative.  Fact Sheet for Patients:  BloggerCourse.com  Fact Sheet for Healthcare Providers:  SeriousBroker.it  This test is no t yet approved or cleared by the [place] FDA and  has been authorized for detection and/or diagnosis of SARS-CoV-2 by FDA under an Emergency Use Authorization  (EUA). This EUA will remain  in effect (meaning this test can be used) for the duration of the COVID-19 declaration under Section 564(b)(1) of the Act, 21 U.S.C.section 360bbb-3(b)(1), unless the authorization is terminated  or revoked sooner.       Influenza A by PCR NEGATIVE NEGATIVE Final   Influenza B by PCR NEGATIVE NEGATIVE Final    Comment: (NOTE) The Xpert Xpress SARS-CoV-2/FLU/RSV plus assay is intended as an aid in the diagnosis of influenza from Nasopharyngeal swab specimens and should not be used as a sole basis for treatment. Nasal washings and aspirates are unacceptable for Xpert Xpress SARS-CoV-2/FLU/RSV testing.  Fact Sheet for Patients: BloggerCourse.com  Fact Sheet for Healthcare Providers: SeriousBroker.it  This test is not yet approved or cleared by the [place] FDA and has been authorized for detection and/or diagnosis of SARS-CoV-2 by FDA under an Emergency Use Authorization (EUA). This EUA will remain in effect (meaning this test can be used) for the duration of the COVID-19 declaration under Section 564(b)(1) of the Act, 21 U.S.C. section [id number](b)(1), unless the authorization is terminated or revoked.  Performed at [facility], [address] [place], [place] [address]      Time coordinating discharge: 35 minutes The [place] controlled substances registry was reviewed for this patient prior to filling the <5 days supply controlled substances script.      SIGNED:   Alberteen Sam, MD  [facility] 03/18/2020, 11:47 AM

## 2020-03-18 NOTE — Plan of Care (Signed)

## 2020-03-19 ENCOUNTER — Other Ambulatory Visit: Payer: Medicaid Other

## 2020-03-21 ENCOUNTER — Other Ambulatory Visit: Payer: Medicaid Other

## 2020-03-22 ENCOUNTER — Ambulatory Visit: Payer: Medicaid Other | Admitting: Family

## 2020-03-23 ENCOUNTER — Telehealth: Payer: Self-pay | Admitting: Family

## 2020-03-23 ENCOUNTER — Other Ambulatory Visit: Payer: Medicaid Other

## 2020-03-23 NOTE — Telephone Encounter (Signed)
On 2/24, Rhonda Palmer showed up for a new patient CHF CLinic appointment with Rhonda Palmer and we were not here. Patient was called 4 times along with actually speaking to patients spouse to notify her of her appointment change and we gave him a new appointment date and time and he did not notify patient. I called today 2/25 and spoke with Rhonda Palmer personally and apologised for the inconvience.    Rhonda Palmer, NT

## 2020-03-27 ENCOUNTER — Telehealth: Payer: Self-pay | Admitting: Cardiology

## 2020-03-27 NOTE — Progress Notes (Deleted)
   Patient ID: Rhonda Palmer, female    DOB: 1970-04-18, 50 y.o.   MRN: [id number]  HPI  Rhonda Palmer is a 50 y/o female with a history of   Echo report from 03/12/20 reviewed and showed an EF of 55-60%  LHC done 03/13/20 showed: 1. No angiographically significant coronary artery disease. 2. Severely elevated left ventricular filling pressure (LVEFD ~40 mmHg at end-expiration) with marked respiratory variation. 3. Grossly normal left ventricular contraction.  Admitted 03/11/20 due to intermittent chest pain for previous 2 weeks. CTA negative for PE/ pneumonia. Cardiology consult obtained. Initially given IV lasix with 7 L lost due to diuresis with subsequent transition to oral diuretics. Discharged after 7 days. Was in the ED 03/03/20 due to chest pain. CTA & troponins were negative and she was released.   She presents today for her initial visit with a chief complaint of  Review of Systems    Physical Exam  Assessment & Plan:  1: Chronic heart failure with preserved ejection fraction without structural changes- - NYHA class - saw cardiology (Agbor-Etang) 03/01/20; returns 03/30/20 - BNP 03/11/20 was 30.7  2: HTN- - BP - saw PCP Mayford Knife)  - BMP 03/18/20 reviewed and showed sodium 135, potassium 3.5, creatinine 0.69 and GFR >60; will repeat BMP today per d/c summary  3: DM- - A1c 03/12/20 was 6.4%  4: Schizophrenia/ bipolar-  5: Obesity/ sleep apnea- - needs sleep study

## 2020-03-27 NOTE — Telephone Encounter (Signed)
Discussed patient with Dr. Azucena Cecil and he reviewed her chart and recent cath procedure, etc.  He advised to have patient f/u with him in office at next available and ok to cancel appointment for tomorrow with Dr End. He advises patient to go to ER if symptoms worsen.  Called patient and she is agreeable to plan of care and appointment on Friday with Dr. Azucena Cecil.

## 2020-03-27 NOTE — Telephone Encounter (Signed)
Pt c/o of Chest Pain: STAT if CP now or developed within 24 hours  1. Are you having CP right now? Yes   2. Are you experiencing any other symptoms (ex. SOB, nausea, vomiting, sweating)? SOB, nausea  3. How long have you been experiencing CP?  Last night. Since 2:30 this morning  4. Is your CP continuous or coming and going? continual  5. Have you taken Nitroglycerin? Yes . 2 since her pain has started.  Patient has an appt tomorrow with DR. End.?

## 2020-03-27 NOTE — Telephone Encounter (Signed)
Call transferred to me. Patient reports she is still having chest pain located around the left side to middle of chest. Some shortness of breath and nausea noted. It is dull. She took tramadol and that took the sharpness of the pain away.  She states the pain is the same as it was prior to her recent heart cath on 03/13/20 by Dr End. She states she does not want to go to the ER again. Her cath showed: Conclusions: 1. No angiographically significant coronary artery disease. 2. Severely elevated left ventricular filling pressure (LVEFD ~40 mmHg at end-expiration) with marked respiratory variation. 3. Grossly normal left ventricular contraction.   Patient agreeable to appointment with Dr End tomorrow afternoon. Pt verbalized understanding to call 911 or go to the emergency room, if he develops any new or worsening symptoms. Routing to Dr. Azucena Cecil for review of plan.

## 2020-03-28 ENCOUNTER — Ambulatory Visit: Payer: Medicaid Other | Admitting: Internal Medicine

## 2020-03-29 ENCOUNTER — Ambulatory Visit: Payer: Medicaid Other | Admitting: Family

## 2020-03-29 ENCOUNTER — Telehealth: Payer: Self-pay | Admitting: Family

## 2020-03-29 NOTE — Telephone Encounter (Signed)
Spoke with patient and rescheduled her no show CHF Clinic appointment for 4/11 since she is scheduled to see [facility] on 3/4 and patient was confused as to the two different appointments.   Stefanie Hodgens, NT

## 2020-03-29 NOTE — Telephone Encounter (Signed)
Patient did not show for her Heart Failure Clinic appointment on 03/29/20. Will attempt to reschedule.  

## 2020-03-30 ENCOUNTER — Ambulatory Visit: Payer: Medicaid Other | Admitting: Cardiology

## 2020-04-04 ENCOUNTER — Telehealth: Payer: Self-pay | Admitting: Cardiology

## 2020-04-04 NOTE — Telephone Encounter (Signed)
Attempted to call the patient. No answer & no voice mail set up.  Will attempt to reach the patient again at a later time.

## 2020-04-04 NOTE — Telephone Encounter (Signed)
Debbe Odea, MD  04/04/2020 9:50 AM EST      Most of patient triggered events associated with sinus rhythm. Occasional SVTs noted. Overall benign cardiac monitor with no significant arrhythmias.

## 2020-04-04 NOTE — Telephone Encounter (Signed)
Returned the patients call. Unable to lmom. Pt voicemail is not set up. ?

## 2020-04-04 NOTE — Telephone Encounter (Signed)
Spoke with the patient. She was offered a sooner appt by the front desk  with Dr. Azucena Cecil for Friday. Patient declined stating that she needs to be seen today or she will go to the ED.  Patient sts that her O2 sat has been dropping to the 70-80's. I asked her if she is sob and she stated "not really": She is using a pulse ox to ck her oxygen, she feels that the reading is accurate since the pulse ox is new.  Patient again sts that she will just go to the ED. Encouraged her to contact her pcp, she declined. Adv her that she could go to an urgent care to be evaluated. She prefers the ED. Adv her that if she feels she is in distress and needs to be seen emergently then that would be the best option.  Patient rqast an update be sent to Dr. Azucena Cecil. FYI update fwd to MD

## 2020-04-04 NOTE — Telephone Encounter (Signed)
Patient calling back, call when able

## 2020-04-04 NOTE — Telephone Encounter (Signed)
Patient calling  States that her pulse ox has been low  Ranging from 78-83 going up and down  Pt c/o of Chest Pain: STAT if CP now or developed within 24 hours  1. Are you having CP right now? No, not at this moment   2. Are you experiencing any other symptoms (ex. SOB, nausea, vomiting, sweating)? No vomiting, but nausea - fluid around the heart   3. How long have you been experiencing CP? Since yesterday   4. Is your CP continuous or coming and going? Coming and going   5. Have you taken Nitroglycerin? no ?

## 2020-04-05 NOTE — Telephone Encounter (Signed)
Patient calling back to take appt in clinic tomorrow with Dr. Azucena Cecil .  Scheduled 3-11 at 820.

## 2020-04-06 ENCOUNTER — Ambulatory Visit: Payer: Medicaid Other | Admitting: Cardiology

## 2020-04-10 ENCOUNTER — Ambulatory Visit (INDEPENDENT_AMBULATORY_CARE_PROVIDER_SITE_OTHER): Payer: Medicaid Other | Admitting: Cardiology

## 2020-04-10 ENCOUNTER — Other Ambulatory Visit: Payer: Self-pay

## 2020-04-10 ENCOUNTER — Encounter: Payer: Self-pay | Admitting: Cardiology

## 2020-04-10 VITALS — BP 148/80 | HR 67 | Ht 61.0 in | Wt 352.0 lb

## 2020-04-10 DIAGNOSIS — R002 Palpitations: Secondary | ICD-10-CM | POA: Diagnosis not present

## 2020-04-10 DIAGNOSIS — R079 Chest pain, unspecified: Secondary | ICD-10-CM | POA: Diagnosis not present

## 2020-04-10 DIAGNOSIS — R0602 Shortness of breath: Secondary | ICD-10-CM

## 2020-04-10 DIAGNOSIS — R0683 Snoring: Secondary | ICD-10-CM

## 2020-04-10 DIAGNOSIS — I10 Essential (primary) hypertension: Secondary | ICD-10-CM | POA: Diagnosis not present

## 2020-04-10 MED ORDER — METOPROLOL SUCCINATE ER 50 MG PO TB24: 50.0000 mg | ORAL_TABLET | Freq: Every day | ORAL | 5 refills | Status: DC

## 2020-04-10 NOTE — Telephone Encounter (Signed)
Patient seen in clinic today, results reviewed with Dr. Agbor-Etang.  

## 2020-04-10 NOTE — Progress Notes (Signed)
Cardiology Office Note:    Date:  04/10/2020   ID:  Rhonda Palmer, DOB 1970-10-12, MRN [id number]  PCP:  Rhonda Jack, FNP  [facility] Cardiologist:  Rhonda Odea, MD  [facility] Electrophysiologist:  None   Referring MD: Rhonda Palmer, *   Chief Complaint  Patient presents with  . Follow-up    6 weeks---s/p LHC w/out Intervention; ZIO resulted 04/04/20    History of Present Illness:    Rhonda Palmer is a 50 y.o. female with a hx of hypertension, hyperlipidemia, diabetes, obesity, anxiety, bipolar disorder, who presents for follow-up.  She was last seen due to chest pain and palpitations.  Cardiac monitor echo and Myoview was ordered.  She ended up being seen in the emergency room due to symptoms of chest pain.  Left heart cath performed on 03/13/2020 showed no angiographically evidence of CAD.  LV filling pressures were elevated, patient started on diuretics.  She states diuretics have helped with her lower extremity edema.  She still has palpitations, also still has chest discomfort.  Endorses daytime fatigue, not sure if she snores, last hospitalization was noted with oxygen desaturations.  She has recorded sats of 70s at home with her pulse oximetry.  Prior notes Left heart cath 02/2020 no angiographically significant CAD Echocardiogram 03/12/2020 normal systolic and diastolic function, EF 55 to 60% Cardiac monitor 03/2020 occasional SVTs, no significant arrhythmias, patient triggered events were associated with sinus rhythm   Past Medical History:  Diagnosis Date  . Anxiety   . Asthma   . Bipolar 1 disorder (HCC)   . Diabetes mellitus without complication (HCC)   . Edema leg for twenty years per patient   taking lasix for edema  . Fibromyalgia   . Hypertension   . Ovarian cyst   . Schizo affective schizophrenia (HCC)   . Thyroid disease     Past Surgical History:  Procedure Laterality Date  . ABDOMINAL HYSTERECTOMY    . ANKLE SURGERY      pt. states long time ago  . LEFT HEART CATH AND CORONARY ANGIOGRAPHY N/A 03/13/2020   Procedure: LEFT HEART CATH AND CORONARY ANGIOGRAPHY;  Surgeon: Rhonda Kendall, MD;  Location: [facility] INVASIVE CV LAB;  Service: Cardiovascular;  Laterality: N/A;    Current Medications: Current Meds  Medication Sig  . albuterol (PROVENTIL HFA;VENTOLIN HFA) 108 (90 Base) MCG/ACT inhaler Inhale 2 puffs into the lungs every 6 (six) hours as needed for wheezing or shortness of breath.  . ARIPiprazole ER (ABILIFY MAINTENA) 300 MG SRER injection Inject 300 mg into the muscle every 28 (twenty-eight) days.  Rhonda Palmer INITIO 675 MG/2.4ML prefilled syringe Inject 2.4 ml intramuscularly single dose  . atorvastatin (LIPITOR) 80 MG tablet Take 80 mg by mouth at bedtime.  . clonazePAM (KLONOPIN) 0.5 MG tablet Take 0.5 mg by mouth 3 (three) times daily.   Marland Kitchen glimepiride (AMARYL) 4 MG tablet Take 4 mg by mouth daily.  . Levothyroxine Sodium 200 MCG CAPS Take 200 mcg by mouth daily.  . metFORMIN (GLUCOPHAGE) 500 MG tablet Take 500 mg by mouth 2 (two) times daily with a meal.  . metoprolol succinate (TOPROL XL) 50 MG 24 hr tablet Take 1 tablet (50 mg total) by mouth daily.  . ondansetron (ZOFRAN) 4 MG tablet Take 4 mg by mouth every 8 (eight) hours as needed.  . pantoprazole (PROTONIX) 40 MG tablet Take 40 mg by mouth daily.  . QUEtiapine (SEROQUEL) 300 MG tablet Take 600 mg by mouth at  bedtime.  . torsemide 40 MG TABS Take 40 mg by mouth daily.  Marland Kitchen zolpidem (AMBIEN) 5 MG tablet Take 5 mg by mouth at bedtime as needed.  . [DISCONTINUED] nitroGLYCERIN (NITROSTAT) 0.4 MG SL tablet Place 1 tablet (0.4 mg total) under the tongue every 5 (five) minutes as needed for chest pain.  . [DISCONTINUED] propranolol (INDERAL) 20 MG tablet Take 20 mg by mouth 3 (three) times daily.     Allergies:   Codeine, Sulfa antibiotics, and Penicillins   Social History   Socioeconomic History  . Marital status: Married    Spouse name: Not on  file  . Number of children: Not on file  . Years of education: Not on file  . Highest education level: Not on file  Occupational History  . Not on file  Tobacco Use  . Smoking status: Never Smoker  . Smokeless tobacco: Never Used  Vaping Use  . Vaping Use: Never used  Substance and Sexual Activity  . Alcohol use: No  . Drug use: No  . Sexual activity: Not on file  Other Topics Concern  . Not on file  Social History Narrative  . Not on file   Social Determinants of Health   Financial Resource Strain: Not on file  Food Insecurity: Not on file  Transportation Needs: Not on file  Physical Activity: Not on file  Stress: Not on file  Social Connections: Not on file     Family History: The patient's family history includes Heart failure in her mother. There is no history of Bladder Cancer or Kidney cancer.  ROS:   Please see the history of present illness.     All other systems reviewed and are negative.  EKGs/Labs/Other Studies Reviewed:    The following studies were reviewed today:   EKG:  EKG not  ordered today.    Recent Labs: 11/20/2019: ALT 18 03/11/2020: B Natriuretic Peptide 30.7; TSH 28.292 03/12/2020: Magnesium 2.3 03/15/2020: Hemoglobin 11.4; Platelets 243 03/18/2020: BUN 22; Creatinine, Ser 0.69; Potassium 3.5; Sodium 135  Recent Lipid Panel    Component Value Date/Time   CHOL 255 (H) 03/12/2020 0431   TRIG 177 (H) 03/12/2020 0431   HDL 73 03/12/2020 0431   CHOLHDL 3.5 03/12/2020 0431   VLDL 35 03/12/2020 0431   LDLCALC 147 (H) 03/12/2020 0431     Risk Assessment/Calculations:      Physical Exam:    VS:  BP (!) 148/80   Pulse 67   Ht 5\' 1"  (1.549 m)   Wt (!) 352 lb (159.7 kg)   BMI 66.51 kg/m     Wt Readings from Last 3 Encounters:  04/10/20 (!) 352 lb (159.7 kg)  03/18/20 (!) 354 lb 11.2 oz (160.9 kg)  03/03/20 (!) 326 lb (147.9 kg)     GEN:  Well nourished, well developed in no acute distress HEENT: Normal NECK: No JVD; No carotid  bruits LYMPHATICS: No lymphadenopathy CARDIAC: RRR, no murmurs, rubs, gallops RESPIRATORY: Decreased breath sounds at bases ABDOMEN: Soft, non-tender, distended MUSCULOSKELETAL:  1+ edema; sternum and left chest tender on palpation SKIN: Warm and dry NEUROLOGIC:  Alert and oriented x 3 PSYCHIATRIC:  Normal affect   ASSESSMENT:    1. Palpitations   2. Chest pain, unspecified type   3. Morbid obesity (HCC)   4. Primary hypertension   5. Shortness of breath   6. Snoring    PLAN:    In order of problems listed above:  1. History of palpitations, cardiac monitor  with occasional SVTs, no significant arrhythmias.  Patient triggered events associated with sinus rhythm.  Stop propanolol, start Toprol-XL 50 mg daily. 2. Patient with chest pain, risk factors of hypertension, hyperlipidemia, obesity.  Echo showed normal systolic and diastolic function, left heart cath with no angiographically significant CAD.  Left chest was tender on palpation during today's exam.  Etiology noncardiac, likely musculoskeletal.  Recommend NSAIDs, follow-up with primary care provider for additional management. 3. Morbid obesity, low-calorie diet, weight loss advised.  Okay for torsemide 4. Hypertension, BP elevated.  Start Toprol-XL as above, 5. Nonspecific shortness of breath, daytime fatigue.  She likely has sleep apnea.  Refer to pulmonary medicine for sleep study/sleep apnea eval.  Morbid obesity likely contributing.  Follow-up in 6 months.     Medication Adjustments/Labs and Tests Ordered: Current medicines are reviewed at length with the patient today.  Concerns regarding medicines are outlined above.  Orders Placed This Encounter  Procedures  . Ambulatory referral to Pulmonology   Meds ordered this encounter  Medications  . metoprolol succinate (TOPROL XL) 50 MG 24 hr tablet    Sig: Take 1 tablet (50 mg total) by mouth daily.    Dispense:  30 tablet    Refill:  5    Patient Instructions   Medication Instructions:   Your physician has recommended you make the following change in your medication:   1.  STOP taking your propranolol (INDERAL).  2.  STOP taking your nitroGLYCERIN (NITROSTAT) .  3.  START taking Toprol XL (Metoprolol Succinate) 50 MG once daily.  *If you need a refill on your cardiac medications before your next appointment, please call your pharmacy*   Lab Work: None ordered If you have labs (blood work) drawn today and your tests are completely normal, you will receive your results only by: Marland Kitchen MyChart Message (if you have MyChart) OR . A paper copy in the mail If you have any lab test that is abnormal or we need to change your treatment, we will call you to review the results.   Testing/Procedures: None ordered   Follow-Up: At [facility], you and your health needs are our priority.  As part of our continuing mission to provide you with exceptional heart care, we have created designated Provider Care Teams.  These Care Teams include your primary Cardiologist (physician) and Advanced Practice Providers (APPs -  Physician Assistants and Nurse Practitioners) who all work together to provide you with the care you need, when you need it.  We recommend signing up for the patient portal called "MyChart".  Sign up information is provided on this After Visit Summary.  MyChart is used to connect with patients for Virtual Visits (Telemedicine).  Patients are able to view lab/test results, encounter notes, upcoming appointments, etc.  Non-urgent messages can be sent to your provider as well.   To learn more about what you can do with MyChart, go to [email or web address].    Your next appointment:   6 month(s)  The format for your next appointment:   In Person  Provider:   Debbe Odea, MD   Other Instructions      Signed, Rhonda Odea, MD  04/10/2020 10:44 AM    [facility]

## 2020-04-10 NOTE — Patient Instructions (Signed)
Medication Instructions:   Your physician has recommended you make the following change in your medication:   1.  STOP taking your propranolol (INDERAL).  2.  STOP taking your nitroGLYCERIN (NITROSTAT) .  3.  START taking Toprol XL (Metoprolol Succinate) 50 MG once daily.  *If you need a refill on your cardiac medications before your next appointment, please call your pharmacy*   Lab Work: None ordered If you have labs (blood work) drawn today and your tests are completely normal, you will receive your results only by: Marland Kitchen MyChart Message (if you have MyChart) OR . A paper copy in the mail If you have any lab test that is abnormal or we need to change your treatment, we will call you to review the results.   Testing/Procedures: None ordered   Follow-Up: At [facility], you and your health needs are our priority.  As part of our continuing mission to provide you with exceptional heart care, we have created designated Provider Care Teams.  These Care Teams include your primary Cardiologist (physician) and Advanced Practice Providers (APPs -  Physician Assistants and Nurse Practitioners) who all work together to provide you with the care you need, when you need it.  We recommend signing up for the patient portal called "MyChart".  Sign up information is provided on this After Visit Summary.  MyChart is used to connect with patients for Virtual Visits (Telemedicine).  Patients are able to view lab/test results, encounter notes, upcoming appointments, etc.  Non-urgent messages can be sent to your provider as well.   To learn more about what you can do with MyChart, go to [email or web address].    Your next appointment:   6 month(s)  The format for your next appointment:   In Person  Provider:   Debbe Odea, MD   Other Instructions

## 2020-04-12 ENCOUNTER — Ambulatory Visit: Payer: Medicaid Other | Admitting: Cardiology

## 2020-04-19 ENCOUNTER — Ambulatory Visit: Payer: Self-pay | Admitting: *Deleted

## 2020-04-19 NOTE — Telephone Encounter (Signed)
Pt called with complaints of anxiety and left side chest pain; these started 04/19/20 at 1120; the pt says her oxygen sats are 84; her pulse is 104; the pt says she took Nitroglycerin at 1120 and her pain went from constant to intermittent; she rates her pain at 6 out of 10pt connected to [facility] for final disposition; unable to obtain operator number due to connectivity issues.  Reason for Disposition . [1] Chest pain lasts > 5 minutes AND [2] age > 1  Answer Assessment - Initial Assessment Questions 1. LOCATION: "Where does it hurt?"       Near left breast 2. RADIATION: "Does the pain go anywhere else?" (e.g., into neck, jaw, arms, back)      3. ONSET: "When did the chest pain begin?" (Minutes, hours or days)      1120 4. PATTERN "Does the pain come and go, or has it been constant since it started?"  "Does it get worse with exertion?"   constant inititally; took nitro at 1120 and pain became intermittent 5. DURATION: "How long does it last" (e.g., seconds, minutes, hours)     constant 6. SEVERITY: "How bad is the pain?"  (e.g., Scale 1-10; mild, moderate, or severe)    - MILD (1-3): doesn't interfere with normal activities     - MODERATE (4-7): interferes with normal activities or awakens from sleep    - SEVERE (8-10): excruciating pain, unable to do any normal activities       6 out of 10 7. CARDIAC RISK FACTORS: "Do you have any history of heart problems or risk factors for heart disease?" (e.g., angina, prior heart attack; diabetes, high blood pressure, high cholesterol, smoker, or strong family history of heart disease)     Recent cardiac cath 03/13/20 8. PULMONARY RISK FACTORS: "Do you have any history of lung disease?"  (e.g., blood clots in lung, asthma, emphysema, birth control pills)  9. CAUSE: "What do you think is causing the chest pain?"     10. OTHER SYMPTOMS: "Do you have any other symptoms?" (e.g., dizziness, nausea, vomiting, sweating, fever, difficulty  breathing, cough)      "face hot as  Fire" 11. PREGNANCY: "Is there any chance you are pregnant?" "When was your last menstrual period?"  Protocols used: CHEST PAIN-A-AH

## 2020-05-05 NOTE — Progress Notes (Deleted)
   Patient ID: Rhonda Palmer, female    DOB: [birth year], 50 y.o.   MRN: [id number]  HPI  Rhonda Palmer is a 50 y/o female with a history of  Echo report from 03/12/20 reviewed and showed an EF of 55-60% without LVH.   LHC completed 03/13/20 and showed: 1. No angiographically significant coronary artery disease. 2. Severely elevated left ventricular filling pressure (LVEFD ~40 mmHg at end-expiration) with marked respiratory variation. 3. Grossly normal left ventricular contraction.  Admitted 03/11/20 due to acute on chronic HF. Cardiology consult obtained. CTA without evidence of PE or pneumonia. Cath completed. Given IV lasix, diuresed ~ 7L and transitioned to oral diuretics. Need for outpatient sleep study. Discharged after 7 days.   She presents today for her initial visit with a chief complaint of   Review of Systems    Physical Exam  Assessment & Plan:  1: Chronic heart failure with preserved ejection fraction without structural changes- - NYHA class - saw cardiology (Rhonda Palmer) 04/10/20 - BNP 03/11/20 was 30.7  2: HTN- - BP - saw PCP Rhonda Palmer) @  - BMP done 03/18/20 reviewed and showed sodium 135, potassium 3.5, creatinine 0.69 and GFR >60  3: Schizophrenia-   4: DM-  - A1c 03/12/20 was 6.4%

## 2020-05-07 ENCOUNTER — Telehealth: Payer: Self-pay | Admitting: Family

## 2020-05-07 ENCOUNTER — Ambulatory Visit: Payer: Medicaid Other | Admitting: Family

## 2020-05-07 NOTE — Telephone Encounter (Signed)
Patient did not show for her Heart Failure Clinic appointment on 05/07/20. Will attempt to reschedule.

## 2020-05-16 DIAGNOSIS — I10 Essential (primary) hypertension: Secondary | ICD-10-CM | POA: Diagnosis present

## 2020-05-16 DIAGNOSIS — F418 Other specified anxiety disorders: Secondary | ICD-10-CM | POA: Diagnosis present

## 2020-05-16 DIAGNOSIS — R0609 Other forms of dyspnea: Secondary | ICD-10-CM | POA: Insufficient documentation

## 2020-05-22 ENCOUNTER — Other Ambulatory Visit: Payer: Self-pay

## 2020-05-22 ENCOUNTER — Ambulatory Visit: Payer: Medicaid Other | Attending: Orthopedic Surgery

## 2020-05-22 DIAGNOSIS — M5431 Sciatica, right side: Secondary | ICD-10-CM | POA: Insufficient documentation

## 2020-05-22 DIAGNOSIS — M6281 Muscle weakness (generalized): Secondary | ICD-10-CM | POA: Insufficient documentation

## 2020-05-22 DIAGNOSIS — M5416 Radiculopathy, lumbar region: Secondary | ICD-10-CM | POA: Diagnosis present

## 2020-05-22 NOTE — Therapy (Signed)
[place] [facility] [address] [place], [place], [address] Phone: [phone]   Fax:  [phone]  Physical Therapy Evaluation  Patient Details  Name: Rhonda Palmer MRN: [id number] Date of Birth: 1970-02-13 Referring Provider (PT): Dedra Skeens, [facility]   Encounter Date: 05/22/2020   PT End of Session - 05/22/20 1320    Visit Number 1    Number of Visits 12    Date for PT Re-Evaluation 07/03/20    Authorization Type Medicaid    PT Start Time 0845    PT Stop Time 0930    PT Time Calculation (min) 45 min    Behavior During Therapy Flat affect;WFL for tasks assessed/performed           Past Medical History:  Diagnosis Date  . Anxiety   . Asthma   . Bipolar 1 disorder (HCC)   . Diabetes mellitus without complication (HCC)   . Edema leg for twenty years per patient   taking lasix for edema  . Fibromyalgia   . Hypertension   . Ovarian cyst   . Schizo affective schizophrenia (HCC)   . Thyroid disease     Past Surgical History:  Procedure Laterality Date  . ABDOMINAL HYSTERECTOMY    . ANKLE SURGERY     pt. states long time ago  . LEFT HEART CATH AND CORONARY ANGIOGRAPHY N/A 03/13/2020   Procedure: LEFT HEART CATH AND CORONARY ANGIOGRAPHY;  Surgeon: Yvonne Kendall, MD;  Location: [facility] INVASIVE CV LAB;  Service: Cardiovascular;  Laterality: N/A;    There were no vitals filed for this visit.    Subjective Assessment - 05/22/20 0854    Subjective Pt reports she is having lower back pain that radiates down her buttock (R) and then radiates around to anterior thigh/foot, foot feels numb.  She notes the symptoms began about a week and a half ago.  She reports this is her first episode of these symptoms.  She reports it is not improving yet.  She reports sx's began after she fell tripping over curb (landing on her back) when taking out the trash.  She needed help to get back up.  She does not report frequent falls.  She saw her doctor 2  days later and reports she hax x-rays which were (-) .  She reports she has been working on some home exercises from her PCP which she does not feel are helping.  She is getting some relief from a heating pad.  Sx's are aggravated with bending.  She also feels off balance because of the injury.  She has her adult daughter (autistic) at home with her for social support.    Pertinent History complex medical hx    Limitations Sitting;Lifting;Standing;Walking;House hold activities    How long can you sit comfortably? few minutes    How long can you walk comfortably? few minutes    Patient Stated Goals Pt 's goal is to improve her balance and be more steady on her feet and to perform her activities at home without being limited by LBP    Currently in Pain? Yes    Pain Location Back    Pain Orientation Right    Pain Onset 1 to 4 weeks ago    Effect of Pain on Daily Activities limits her daily activities              [facility] PT Assessment - 05/22/20 0001      Assessment   Medical Diagnosis LBP  with R LE radiculitis    Referring Provider (PT) Dedra Skeens, [facility]    Prior Therapy none for this      Balance Screen   Has the patient fallen in the past 6 months Yes      Home Environment   Living Environment Private residence    Living Arrangements Children    Type of Home Apartment    Home Layout One level      Prior Function   Level of Independence Independent with basic ADLs    Vocation Other (comment)    Vocation Requirements pt not employed    Leisure "takes care of her daughter and her daughter takes care of her"            PT Evaluation: Pt required extensive time with FOTO outcome measure today.  Observation: pt requires increased time and use of UE support for sit to stand transfer; unable to transfer from sit to supine without min A from PT (deferred supine position exam components today)  AROM: lumbar spine extension majorly restricted, pt stands with hips flexed and unable  to stand to upright neutral posture; side bend R and L moderately restricted, flexion moderately restricted(all directions painful); repeated motion testing deferred to 2nd visit due to time constraint today  Myotomes/MMT: Hip flexion R and L 3/5 Knee flexion R 4/5, L 4+/5 Knee extension R 4/5, L 4/5 Ankle DF 4/5 R and L (AROM R and L ankle DF/PF limited b/l) Pt able to perform b/l heel raise   Dermatomes: pt reports in tact sensation R and L L2-S2 dermatomes  Reflexes: deferred  (-) seated slump R or L  Palpation: TTP R lumbar spine  Functional Activities: pt requires lean on counter top to reach object on floor and does not flex knees to reach today     Objective measurements completed on examination: See above findings.          PT Education - 05/22/20 1319    Education Details PT POC/goals    Person(s) Educated Patient    Methods Explanation    Comprehension Verbalized understanding            PT Short Term Goals - 05/22/20 1334      PT SHORT TERM GOAL #1   Title pt will be independent with HEP for lumbar spine AROM/strength    Time 4    Period Weeks    Target Date 06/19/20             PT Long Term Goals - 05/22/20 1334      PT LONG TERM GOAL #1   Title Improve FOTO from 10 to 41 indicating improved ability to partuicipate in daily activities without being limited by LBP    Time 6    Period Weeks    Status New    Target Date 07/03/20      PT LONG TERM GOAL #2   Title Pt will demonstrate ability to peform hip hinge squat/lift technique for lifting and reaching items on low surface or floor without verbal cues on form    Baseline unable    Time 6    Period Weeks    Status New    Target Date 07/03/20      PT LONG TERM GOAL #3   Title Pt will be able to amb from her apartment to the road and back wheeling her garbage can without loss of balance    Baseline fell last week doing this  Time 6    Period Weeks    Status New    Target Date  07/03/20                  Plan - 05/22/20 1321    Clinical Impression Statement Pt is a pleasant 50 y/o F with c/o acute episode of LBP with R LE radicular sx's.  She is an appropriate candidate for skilled PT to address ROM, strength, activity tolerance impairments and to address her balance/fall risk.  She does have multiple comorbidities and medical hx that will affect her healing time/prognosis and participation in PT.  She is motivated to participate in a course of PT and would like to pursue tx at this time.    Personal Factors and Comorbidities Comorbidity 3+    Examination-Activity Limitations Bathing;Bed Mobility;Bend;Hygiene/Grooming;Lift;Stand;Squat;Sit;Locomotion Level;Transfers    Examination-Participation Restrictions Cleaning;Meal Prep    Stability/Clinical Decision Making Evolving/Moderate complexity    Clinical Decision Making Moderate    Rehab Potential Good    PT Frequency 2x / week    PT Duration 6 weeks    PT Treatment/Interventions ADLs/Self Care Home Management;Moist Heat;Gait training;Functional mobility training;Therapeutic activities;Therapeutic exercise;Balance training;Neuromuscular re-education;Manual techniques;Patient/family education    PT Next Visit Plan Lumbar spine AROM, LE/core mm retraining, posture and body mechanics training    Consulted and Agree with Plan of Care Patient           Patient will benefit from skilled therapeutic intervention in order to improve the following deficits and impairments:  Abnormal gait,Decreased range of motion,Difficulty walking,Decreased endurance,Pain,Decreased activity tolerance,Decreased balance,Hypomobility,Improper body mechanics,Postural dysfunction,Increased edema,Decreased strength,Decreased mobility,Impaired sensation  Visit Diagnosis: Muscle weakness (generalized)  Radiculopathy, lumbar region  Sciatica, right side     Problem List Patient Active Problem List   Diagnosis Date Noted  . Unstable  angina (HCC)   . Chest pain 03/11/2020  . Normocytic anemia 03/11/2020  . Bipolar 1 disorder (HCC)   . Depression with anxiety   . Hypertension   . HLD (hyperlipidemia)   . Diabetes mellitus without complication (HCC)   . Morbid obesity with BMI of 60.0-69.9, adult (HCC)   . Acute respiratory failure with hypoxia (HCC)   . Dyspnea on exertion   . Fall 03/27/2017  . Long term (current) use of opiate analgesic 06/26/2016  . Long term prescription opiate use 06/26/2016  . Opiate use 06/26/2016  . Obesity 01/17/2016  . History of opioid abuse (HCC) 01/17/2016  . Asthma 01/17/2016  . Mixed hyperlipidemia 10/09/2015  . Hypokalemia 10/09/2015  . Headache above the eye region 06/16/2013  . Edema of extremities 05/18/2013  . 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 GERD (gastroesophageal reflux disease) 04/11/2013  . Personality disorder (HCC) 10/16/2012  . Chronic pain syndrome 06/18/2012  . S/P ankle fusion 05/21/2012  . Schizoaffective disorder, bipolar type (HCC) 02/20/2012  . 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 Hypothyroidism 11/16/2002    Ardine Bjork 05/22/2020, 1:59 PM Max Fickle, PT, DPT    [place] [facility] [address] [place], [place], [address] Phone: [phone]   Fax:  [phone]  Name: Rhonda Palmer MRN: [id number] Date of Birth: [birth year]

## 2020-05-28 ENCOUNTER — Ambulatory Visit: Payer: Medicaid Other | Attending: Orthopedic Surgery

## 2020-05-28 ENCOUNTER — Other Ambulatory Visit: Payer: Self-pay

## 2020-05-28 DIAGNOSIS — M6281 Muscle weakness (generalized): Secondary | ICD-10-CM | POA: Diagnosis present

## 2020-05-28 DIAGNOSIS — M5431 Sciatica, right side: Secondary | ICD-10-CM | POA: Insufficient documentation

## 2020-05-28 DIAGNOSIS — M5416 Radiculopathy, lumbar region: Secondary | ICD-10-CM | POA: Diagnosis present

## 2020-05-28 NOTE — Therapy (Signed)
[place] [facility] [address] [place], [place], [address] Phone: [phone]   Fax:  [phone]  Physical Therapy Treatment  Patient Details  Name: Rhonda Palmer MRN: [id number] Date of Birth: 02/18/1970 Referring Provider (PT): Dedra Skeens, [facility]   Encounter Date: 05/28/2020   PT End of Session - 05/28/20 1217    Visit Number 2    Number of Visits 12    Date for PT Re-Evaluation 07/03/20    Authorization Type Medicaid (3 visits between 5/2 and 5/10)    Authorization Time Period re-cert needed 0/81    Authorization - Visit Number 1    Authorization - Number of Visits 4    Progress Note Due on Visit 4    PT Start Time 0900    PT Stop Time 0940    PT Time Calculation (min) 40 min    Activity Tolerance Patient tolerated treatment well    Behavior During Therapy Flat affect;WFL for tasks assessed/performed           Past Medical History:  Diagnosis Date  . Anxiety   . Asthma   . Bipolar 1 disorder (HCC)   . Diabetes mellitus without complication (HCC)   . Edema leg for twenty years per patient   taking lasix for edema  . Fibromyalgia   . Hypertension   . Ovarian cyst   . Schizo affective schizophrenia (HCC)   . Thyroid disease     Past Surgical History:  Procedure Laterality Date  . ABDOMINAL HYSTERECTOMY    . ANKLE SURGERY     pt. states long time ago  . LEFT HEART CATH AND CORONARY ANGIOGRAPHY N/A 03/13/2020   Procedure: LEFT HEART CATH AND CORONARY ANGIOGRAPHY;  Surgeon: Yvonne Kendall, MD;  Location: [facility] INVASIVE CV LAB;  Service: Cardiovascular;  Laterality: N/A;    There were no vitals filed for this visit.   Subjective Assessment - 05/28/20 0904    Subjective pt reports no significant change in her sx's since last session.  She has been getting slight temporary relief from heating pad.    Pertinent History complex medical hx    Limitations Sitting;Lifting;Standing;Walking;House hold activities    How long  can you sit comfortably? few minutes    How long can you walk comfortably? few minutes    Patient Stated Goals Pt 's goal is to improve her balance and be more steady on her feet and to perform her activities at home without being limited by LBP    Pain Onset 1 to 4 weeks ago           Therapeutic Exercises:  SKTC: 4x10 sec ea (pt requires increased time to transfer sit to supine, strap needed for pt to hold and PT min A for LE) Supine hooklying trunk rotation x10 ea Diaphragmatic breathing 3x8 (each set spaced out through session) Supine alternating shoulder flexion x10 Supine shoulder horizontal abd x10 Seated lumbothoracic extension x10  Pt education for sitting posture with lumbar roll/pillow for sx control   HEP: Access Code: [email or web address] URL: [email or web address] Date: 05/28/2020 Prepared by: Max Fickle  Exercises  . Seated Thoracic Lumbar Extension - 1 x daily - 2 sets - 10 reps . Supine Alternating Shoulder Flexion - 1 x daily - 2 sets - 10 reps . Supine Lower Trunk Rotation - 1 x daily - 2 sets - 10 reps      PT Education - 05/28/20 1217    Education Details exercise technique/form  Person(s) Educated Patient    Methods Explanation;Demonstration    Comprehension Verbalized understanding;Returned demonstration            PT Short Term Goals - 05/22/20 1334      PT SHORT TERM GOAL #1   Title pt will be independent with HEP for lumbar spine AROM/strength    Time 4    Period Weeks    Target Date 06/19/20             PT Long Term Goals - 05/22/20 1334      PT LONG TERM GOAL #1   Title Improve FOTO from 10 to 41 indicating improved ability to partuicipate in daily activities without being limited by LBP    Time 6    Period Weeks    Status New    Target Date 07/03/20      PT LONG TERM GOAL #2   Title Pt will demonstrate ability to peform hip hinge squat/lift technique for lifting and reaching items on low surface or floor  without verbal cues on form    Baseline unable    Time 6    Period Weeks    Status New    Target Date 07/03/20      PT LONG TERM GOAL #3   Title Pt will be able to amb from her apartment to the road and back wheeling her garbage can without loss of balance    Baseline fell last week doing this    Time 6    Period Weeks    Status New    Target Date 07/03/20                 Plan - 05/28/20 1220    Clinical Impression Statement Pt's overall lumbar spine/trunk ROM are significantly limited due to lumbopelvic and LE mobility and strength deficits.  She is unable to perform [place] independently, requires PT min A and use of strap to assist L LE.  She requires increased time to perform therapeutic exercises today.  There was no clear directional preference for lumbar spine movement for influencing sx's today.  She reports no significant change in symptoms at end of session.  HEP was initiated today; plan to progress based on pt's tolerance at next session.    Personal Factors and Comorbidities Comorbidity 3+    Examination-Activity Limitations Bathing;Bed Mobility;Bend;Hygiene/Grooming;Lift;Stand;Squat;Sit;Locomotion Level;Transfers    Examination-Participation Restrictions Cleaning;Meal Prep    Stability/Clinical Decision Making Evolving/Moderate complexity    Rehab Potential Good    PT Frequency 2x / week    PT Duration 6 weeks    PT Treatment/Interventions ADLs/Self Care Home Management;Moist Heat;Gait training;Functional mobility training;Therapeutic activities;Therapeutic exercise;Balance training;Neuromuscular re-education;Manual techniques;Patient/family education    PT Next Visit Plan Lumbar spine AROM, LE/core mm retraining, posture and body mechanics training    Consulted and Agree with Plan of Care Patient           Patient will benefit from skilled therapeutic intervention in order to improve the following deficits and impairments:  Abnormal gait,Decreased range of  motion,Difficulty walking,Decreased endurance,Pain,Decreased activity tolerance,Decreased balance,Hypomobility,Improper body mechanics,Postural dysfunction,Increased edema,Decreased strength,Decreased mobility,Impaired sensation  Visit Diagnosis: Radiculopathy, lumbar region  Sciatica, right side  Muscle weakness (generalized)     Problem List Patient Active Problem List   Diagnosis Date Noted  . Unstable angina (HCC)   . Chest pain 03/11/2020  . Normocytic anemia 03/11/2020  . Bipolar 1 disorder (HCC)   . Depression with anxiety   . Hypertension   . HLD (  hyperlipidemia)   . Diabetes mellitus without complication (HCC)   . Morbid obesity with BMI of 60.0-69.9, adult (HCC)   . Acute respiratory failure with hypoxia (HCC)   . Dyspnea on exertion   . Fall 03/27/2017  . Long term (current) use of opiate analgesic 06/26/2016  . Long term prescription opiate use 06/26/2016  . Opiate use 06/26/2016  . Obesity 01/17/2016  . History of opioid abuse (HCC) 01/17/2016  . Asthma 01/17/2016  . Mixed hyperlipidemia 10/09/2015  . Hypokalemia 10/09/2015  . Headache above the eye region 06/16/2013  . Edema of extremities 05/18/2013  . 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 GERD (gastroesophageal reflux disease) 04/11/2013  . Personality disorder (HCC) 10/16/2012  . Chronic pain syndrome 06/18/2012  . S/P ankle fusion 05/21/2012  . Schizoaffective disorder, bipolar type (HCC) 02/20/2012  . 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 Hypothyroidism 11/16/2002    Ardine Bjork 05/28/2020, 12:28 PM Max Fickle, PT, DPT    [place] [facility] [address] [place], [place], [address] Phone: [phone]   Fax:  [phone]  Name: Rhonda Palmer MRN: [id number] Date of Birth: January 29, 1970

## 2020-05-29 ENCOUNTER — Institutional Professional Consult (permissible substitution): Payer: Medicaid Other | Admitting: Internal Medicine

## 2020-05-30 ENCOUNTER — Ambulatory Visit: Payer: Medicaid Other

## 2020-05-30 ENCOUNTER — Other Ambulatory Visit: Payer: Self-pay

## 2020-05-30 DIAGNOSIS — M5431 Sciatica, right side: Secondary | ICD-10-CM

## 2020-05-30 DIAGNOSIS — M6281 Muscle weakness (generalized): Secondary | ICD-10-CM

## 2020-05-30 DIAGNOSIS — M5416 Radiculopathy, lumbar region: Secondary | ICD-10-CM | POA: Diagnosis not present

## 2020-05-30 NOTE — Therapy (Signed)
[facility] [address] [place], [place], [address] Phone: [phone]   Fax:  [phone]  Physical Therapy Treatment  Patient Details  Name: Rhonda Palmer MRN: [id number] Date of Birth: July 23, 1970 Referring Provider (PT): Dedra Skeens, [facility]   Encounter Date: 05/30/2020   PT End of Session - 05/30/20 1144    Visit Number 3    Number of Visits 12    Date for PT Re-Evaluation 07/03/20    Authorization Type Medicaid (3 visits between 5/2 and 5/10)    Authorization Time Period re-cert needed 7/61    Authorization - Visit Number 2    Authorization - Number of Visits 4    Progress Note Due on Visit 4    PT Start Time 0900    PT Stop Time 0930    PT Time Calculation (min) 30 min    Activity Tolerance Patient tolerated treatment well    Behavior During Therapy Flat affect;WFL for tasks assessed/performed           Past Medical History:  Diagnosis Date  . Anxiety   . Asthma   . Bipolar 1 disorder (HCC)   . Diabetes mellitus without complication (HCC)   . Edema leg for twenty years per patient   taking lasix for edema  . Fibromyalgia   . Hypertension   . Ovarian cyst   . Schizo affective schizophrenia (HCC)   . Thyroid disease     Past Surgical History:  Procedure Laterality Date  . ABDOMINAL HYSTERECTOMY    . ANKLE SURGERY     pt. states long time ago  . LEFT HEART CATH AND CORONARY ANGIOGRAPHY N/A 03/13/2020   Procedure: LEFT HEART CATH AND CORONARY ANGIOGRAPHY;  Surgeon: Yvonne Kendall, MD;  Location: [facility] INVASIVE CV LAB;  Service: Cardiovascular;  Laterality: N/A;    There were no vitals filed for this visit.   Subjective Assessment - 05/30/20 1142    Subjective Pt reports she feels like her sx's are unchanged (R low back pain and radiating LE pain/numbness to foot).  She does feel like she is moving better overall.  She reports working on her HEP.    Pertinent History complex medical hx    Limitations  Sitting;Lifting;Standing;Walking;House hold activities    How long can you sit comfortably? few minutes    How long can you walk comfortably? few minutes    Patient Stated Goals Pt 's goal is to improve her balance and be more steady on her feet and to perform her activities at home without being limited by LBP    Pain Onset 1 to 4 weeks ago           Treatment Today: Therapeutic Exercises:   Seated lumbar spine flexion rolling green physioball forward on tx table x10 Seated lumbothoracic extension x10 Seated lumbar spine side bend x5 Seated heel slide x15 R and L (R side isweaker)  Seated toe raises x20 Seated heel raises x20 These exercises were challenging as pt has limited ankle mobility  Standing posture training- tactile/verbal cues for upright standing posture (3 min) Attempted standing scapular retraction x10- pt reports too difficult in standing Seated scapular retraction x10      Access Code: [facility] URL: [email or web address] Date: 05/30/2020 Prepared by: Max Fickle  Exercises  . Seated Thoracic Lumbar Extension - 1 x daily - 2 sets - 10 reps . Supine Alternating Shoulder Flexion - 1 x daily - 2 sets - 10 reps . Supine Lower  Trunk Rotation - 1 x daily - 2 sets - 10 reps . Seated Scapular Retraction - 1 x daily - 7 x weekly - 2 sets - 10 reps . Seated Heel Slide - 1 x daily - 7 x weekly - 2 sets - 10 reps . Seated Heel Raise - 1 x daily - 7 x weekly - 2 sets - 10 reps                          PT Education - 05/30/20 1143    Education Details exercise technique/form    Person(s) Educated Patient    Methods Explanation;Demonstration;Tactile cues;Verbal cues    Comprehension Verbalized understanding;Returned demonstration            PT Short Term Goals - 05/22/20 1334      PT SHORT TERM GOAL #1   Title pt will be independent with HEP for lumbar spine AROM/strength    Time 4    Period Weeks    Target Date  06/19/20             PT Long Term Goals - 05/22/20 1334      PT LONG TERM GOAL #1   Title Improve FOTO from 10 to 41 indicating improved ability to partuicipate in daily activities without being limited by LBP    Time 6    Period Weeks    Status New    Target Date 07/03/20      PT LONG TERM GOAL #2   Title Pt will demonstrate ability to peform hip hinge squat/lift technique for lifting and reaching items on low surface or floor without verbal cues on form    Baseline unable    Time 6    Period Weeks    Status New    Target Date 07/03/20      PT LONG TERM GOAL #3   Title Pt will be able to amb from her apartment to the road and back wheeling her garbage can without loss of balance    Baseline fell last week doing this    Time 6    Period Weeks    Status New    Target Date 07/03/20                 Plan - 05/30/20 1144    Clinical Impression Statement Pt tolerated seated therapeutic exercises today better than supine therapeutic exercises exercises at last visit.  Her LE ROM and strength deficits are contributing to excess lumbar spine frontal plane motion during ambulation.  Would likely benefit from incorporating neutral spine core mm retraining into tx plan at next visit.    Personal Factors and Comorbidities Comorbidity 3+    Examination-Activity Limitations Bathing;Bed Mobility;Bend;Hygiene/Grooming;Lift;Stand;Squat;Sit;Locomotion Level;Transfers    Examination-Participation Restrictions Cleaning;Meal Prep    Stability/Clinical Decision Making Evolving/Moderate complexity    Rehab Potential Good    PT Frequency 2x / week    PT Duration 6 weeks    PT Treatment/Interventions ADLs/Self Care Home Management;Moist Heat;Gait training;Functional mobility training;Therapeutic activities;Therapeutic exercise;Balance training;Neuromuscular re-education;Manual techniques;Patient/family education    PT Next Visit Plan Lumbar spine AROM, LE/core mm retraining, posture and body  mechanics training    Consulted and Agree with Plan of Care Patient           Patient will benefit from skilled therapeutic intervention in order to improve the following deficits and impairments:  Abnormal gait,Decreased range of motion,Difficulty walking,Decreased endurance,Pain,Decreased activity tolerance,Decreased balance,Hypomobility,Improper body mechanics,Postural dysfunction,Increased  edema,Decreased strength,Decreased mobility,Impaired sensation  Visit Diagnosis: Radiculopathy, lumbar region  Sciatica, right side  Muscle weakness (generalized)     Problem List Patient Active Problem List   Diagnosis Date Noted  . Unstable angina (HCC)   . Chest pain 03/11/2020  . Normocytic anemia 03/11/2020  . Bipolar 1 disorder (HCC)   . Depression with anxiety   . Hypertension   . HLD (hyperlipidemia)   . Diabetes mellitus without complication (HCC)   . Morbid obesity with BMI of 60.0-69.9, adult (HCC)   . Acute respiratory failure with hypoxia (HCC)   . Dyspnea on exertion   . Fall 03/27/2017  . Long term (current) use of opiate analgesic 06/26/2016  . Long term prescription opiate use 06/26/2016  . Opiate use 06/26/2016  . Obesity 01/17/2016  . History of opioid abuse (HCC) 01/17/2016  . Asthma 01/17/2016  . Mixed hyperlipidemia 10/09/2015  . Hypokalemia 10/09/2015  . Headache above the eye region 06/16/2013  . Edema of extremities 05/18/2013  . 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 GERD (gastroesophageal reflux disease) 04/11/2013  . Personality disorder (HCC) 10/16/2012  . Chronic pain syndrome 06/18/2012  . S/P ankle fusion 05/21/2012  . Schizoaffective disorder, bipolar type (HCC) 02/20/2012  . 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 Hypothyroidism 11/16/2002    Ardine Bjork 05/30/2020, 11:51 AM Max Fickle, PT, DPT Physical Therapist - [facility]    [facility] [address] [place], [place], [address] Phone: [phone]   Fax:  [phone]  Name: Shellie Rogoff MRN: [id number] Date of Birth: 21-Aug-1970

## 2020-06-04 ENCOUNTER — Other Ambulatory Visit: Payer: Self-pay

## 2020-06-04 ENCOUNTER — Ambulatory Visit: Payer: Medicaid Other

## 2020-06-04 DIAGNOSIS — M5416 Radiculopathy, lumbar region: Secondary | ICD-10-CM

## 2020-06-04 DIAGNOSIS — M6281 Muscle weakness (generalized): Secondary | ICD-10-CM

## 2020-06-04 DIAGNOSIS — M5431 Sciatica, right side: Secondary | ICD-10-CM

## 2020-06-04 NOTE — Therapy (Signed)
[facility] [address] [place], [place], [address] Phone: [phone]   Fax:  [phone]  Physical Therapy Treatment Physical Therapy Progress Note/Re-certification   Dates of reporting period  05/28/20   to   06/04/20  Patient Details  Name: Rhonda Palmer MRN: [id number] Date of Birth: 1970/03/10 Referring Provider (PT): Dedra Skeens, [facility]   Encounter Date: 06/04/2020   PT End of Session - 06/04/20 1242    Visit Number 4    Number of Visits 12    Date for PT Re-Evaluation 07/03/20    Authorization Type Medicaid (3 visits between 5/2 and 5/10)    Authorization Time Period re-cert/PN done on 06/04/20    Authorization - Visit Number 4    Authorization - Number of Visits 4    Progress Note Due on Visit 4    PT Start Time 0905    PT Stop Time 0945    PT Time Calculation (min) 40 min    Activity Tolerance Patient tolerated treatment well    Behavior During Therapy Flat affect;WFL for tasks assessed/performed           Past Medical History:  Diagnosis Date  . Anxiety   . Asthma   . Bipolar 1 disorder (HCC)   . Diabetes mellitus without complication (HCC)   . Edema leg for twenty years per patient   taking lasix for edema  . Fibromyalgia   . Hypertension   . Ovarian cyst   . Schizo affective schizophrenia (HCC)   . Thyroid disease     Past Surgical History:  Procedure Laterality Date  . ABDOMINAL HYSTERECTOMY    . ANKLE SURGERY     pt. states long time ago  . LEFT HEART CATH AND CORONARY ANGIOGRAPHY N/A 03/13/2020   Procedure: LEFT HEART CATH AND CORONARY ANGIOGRAPHY;  Surgeon: Yvonne Kendall, MD;  Location: [facility] INVASIVE CV LAB;  Service: Cardiovascular;  Laterality: N/A;    There were no vitals filed for this visit.   Subjective Assessment - 06/04/20 1241    Subjective Pt reports LBP is the same.  R LE sx's are intermittent.  Standing tolerance is limited due to the pain.    Pertinent History complex medical hx     Limitations Sitting;Lifting;Standing;Walking;House hold activities    How long can you sit comfortably? few minutes    How long can you walk comfortably? few minutes    Patient Stated Goals Pt 's goal is to improve her balance and be more steady on her feet and to perform her activities at home without being limited by LBP    Currently in Pain? Yes    Pain Score 5     Pain Location Back    Pain Onset 1 to 4 weeks ago            Treatment Today:  Reviewed PT goals. Lumbar spine AROM: pt demonstrates ability to actively flex, side bend R and L, and extend (from flexed to neutral) lumbar spine today; ROM is still major/mod limited but is an improvement from her initial evaluation  Standing tolerance: 3 min before pt requests seated rest break.  Strength: hip abd and extension: L 3/5, R 3/5  Gait: pt lacks ability to activate LE posterior chain bilaterally due to current lower leg mobility impairments and lack of strength in trunk/hip mm   Therapeutic Exercises:  Seated lumbar spine flexion rolling green physioball forward on tx table x10 Seated lumbothoracic extension x10 Seated lumbar  spine side bend x5 Seated heel slide x15 R and L (R side isweaker)  Seated toe raises x20 Seated heel raises x20 These exercises were challenging as pt has limited ankle mobility  Standing posture training- tactile/verbal cues for upright standing posture (3 min) Attempted standing hip abd x 5 ea, however pt not able to continue and request seated break due to report of LBP Seated scapular retraction x10 Seated TA bracing: 5 sec holds x 20 (pt hands on abdomen for tactile cues)       PT Education - 06/04/20 1241    Education Details exercise technique/form    Person(s) Educated Patient    Methods Explanation    Comprehension Verbalized understanding;Returned demonstration            PT Short Term Goals - 06/04/20 1246      PT SHORT TERM GOAL #1   Title pt will be  independent with HEP for lumbar spine AROM/strength    Baseline 5/9: progressing    Time 4    Period Weeks    Target Date 06/19/20             PT Long Term Goals - 06/04/20 1246      PT LONG TERM GOAL #1   Title Improve FOTO from 10 to 41 indicating improved ability to partuicipate in daily activities without being limited by LBP    Time 6    Period Weeks    Status On-going    Target Date 07/03/20      PT LONG TERM GOAL #2   Title Pt will demonstrate ability to peform hip hinge squat/lift technique for lifting and reaching items on low surface or floor without verbal cues on form    Baseline unable    Time 6    Period Weeks    Status On-going    Target Date 07/03/20      PT LONG TERM GOAL #3   Title Pt will be able to amb from her apartment to the road and back wheeling her garbage can without loss of balance    Baseline fell last week doing this    Time 6    Period Weeks    Status On-going    Target Date 07/03/20                 Plan - 06/04/20 1245    Clinical Impression Statement Pt's tolerance for standing therapeutic exercises was limited today; able to progress with neutral spine core mm retraining in seated position.  She has attended 3 PT treatment sessions so far and is gradually showing some improvement in lumbar spine AROM.  Standing tolerance and strength in trunk/LE mm remain significantly limited at this time; therefore, further skilled PT is indicated.    Personal Factors and Comorbidities Comorbidity 3+    Examination-Activity Limitations Bathing;Bed Mobility;Bend;Hygiene/Grooming;Lift;Stand;Squat;Sit;Locomotion Level;Transfers    Examination-Participation Restrictions Cleaning;Meal Prep    Stability/Clinical Decision Making Evolving/Moderate complexity    Rehab Potential Good    PT Frequency 2x / week    PT Duration 6 weeks    PT Treatment/Interventions ADLs/Self Care Home Management;Moist Heat;Gait training;Functional mobility  training;Therapeutic activities;Therapeutic exercise;Balance training;Neuromuscular re-education;Manual techniques;Patient/family education    PT Next Visit Plan Lumbar spine AROM, LE/core mm retraining, posture and body mechanics training    Consulted and Agree with Plan of Care Patient           Patient will benefit from skilled therapeutic intervention in order to improve the following deficits  and impairments:  Abnormal gait,Decreased range of motion,Difficulty walking,Decreased endurance,Pain,Decreased activity tolerance,Decreased balance,Hypomobility,Improper body mechanics,Postural dysfunction,Increased edema,Decreased strength,Decreased mobility,Impaired sensation  Visit Diagnosis: Radiculopathy, lumbar region  Sciatica, right side  Muscle weakness (generalized)     Problem List Patient Active Problem List   Diagnosis Date Noted  . Unstable angina (HCC)   . Chest pain 03/11/2020  . Normocytic anemia 03/11/2020  . Bipolar 1 disorder (HCC)   . Depression with anxiety   . Hypertension   . HLD (hyperlipidemia)   . Diabetes mellitus without complication (HCC)   . Morbid obesity with BMI of 60.0-69.9, adult (HCC)   . Acute respiratory failure with hypoxia (HCC)   . Dyspnea on exertion   . Fall 03/27/2017  . Long term (current) use of opiate analgesic 06/26/2016  . Long term prescription opiate use 06/26/2016  . Opiate use 06/26/2016  . Obesity 01/17/2016  . History of opioid abuse (HCC) 01/17/2016  . Asthma 01/17/2016  . Mixed hyperlipidemia 10/09/2015  . Hypokalemia 10/09/2015  . Headache above the eye region 06/16/2013  . Edema of extremities 05/18/2013  . 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 GERD (gastroesophageal reflux disease) 04/11/2013  . Personality disorder (HCC) 10/16/2012  . Chronic pain syndrome 06/18/2012  . S/P ankle fusion 05/21/2012  . Schizoaffective disorder, bipolar type (HCC) 02/20/2012  . 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 Hypothyroidism 11/16/2002    Ardine Bjork 06/04/2020, 1:04 PM Max Fickle, PT, DPT Physical Therapist - [place]   [facility] [address] [place], [place], [address] Phone: [phone]   Fax:  [phone]  Name: Alisabeth Selkirk MRN: [id number] Date of Birth: 09/09/1970

## 2020-06-06 ENCOUNTER — Ambulatory Visit: Payer: Medicaid Other

## 2020-06-13 ENCOUNTER — Ambulatory Visit: Payer: Medicaid Other

## 2020-06-18 ENCOUNTER — Ambulatory Visit: Payer: Medicaid Other

## 2020-06-20 ENCOUNTER — Ambulatory Visit: Payer: Medicaid Other

## 2020-06-21 ENCOUNTER — Telehealth: Payer: Self-pay | Admitting: Cardiology

## 2020-06-21 NOTE — Telephone Encounter (Signed)
   [facility] Pre-operative Risk Assessment    Patient Name: Cantrell Martus  DOB: 1970/11/17  MRN: [id number]   HEARTCARE STAFF: - Please ensure there is not already an duplicate clearance open for this procedure. - Under Visit Info/Reason for Call, type in Other and utilize the format Clearance MM/DD/YY or Clearance TBD. Do not use dashes or single digits. - If request is for dental extraction, please clarify the # of teeth to be extracted.  Request for surgical clearance:  1. What type of surgery is being performed? Gastric Bypass   2. When is this surgery scheduled? TBD   3. What type of clearance is required (medical clearance vs. Pharmacy clearance to hold med vs. Both)? both  4. Are there any medications that need to be held prior to surgery and how long? Not listed, please advise if needed  5. Practice name and name of physician performing surgery? [facility] - Dr Berneda Rose  6. What is the office phone number? [phone]   7.   What is the office fax number? [facility]  8.   Anesthesia type (None, local, MAC, general) ? General    Caryl Pina Gerringer 06/21/2020, 2:04 PM  _________________________________________________________________   (provider comments below)

## 2020-06-21 NOTE — Telephone Encounter (Signed)
   Name: Rhonda Palmer  DOB: Jul 24, 1970  MRN: [id number]  Primary Cardiologist: Debbe Odea, MD  Chart reviewed as part of pre-operative protocol coverage. Because of Rhonda Palmer's past medical history, planned gastric bypass surgery, and time since last visit, she will require a follow-up visit in order to better assess preoperative cardiovascular risk.  Pre-op covering staff: - Please schedule appointment and call patient to inform them. If patient already had an upcoming appointment within acceptable timeframe, please add "pre-op clearance" to the appointment notes so provider is aware. - Please contact requesting surgeon's office via preferred method (i.e, phone, fax) to inform them of need for appointment prior to surgery.  If applicable, this message will also be routed to pharmacy pool and/or primary cardiologist for input on holding anticoagulant/antiplatelet agent as requested below so that this information is available to the clearing provider at time of patient's appointment.   Eula Listen, PA-C  06/21/2020, 2:24 PM

## 2020-06-21 NOTE — Telephone Encounter (Signed)
SPOKE WITH PT. AGREEABLE TO PRE-OP APPT WITH CHRIS BERGE ON 6/3. FWD CLEARANCE NOTES TO NP FOR UPCOMING APPT. SENT FYI TO REQUESTING OFFICE PT HAS APPT 6/3

## 2020-06-27 ENCOUNTER — Ambulatory Visit: Payer: Medicaid Other | Admitting: Physical Therapy

## 2020-06-29 ENCOUNTER — Ambulatory Visit (INDEPENDENT_AMBULATORY_CARE_PROVIDER_SITE_OTHER): Payer: Medicaid Other | Admitting: Nurse Practitioner

## 2020-06-29 ENCOUNTER — Encounter: Payer: Self-pay | Admitting: Cardiology

## 2020-06-29 ENCOUNTER — Encounter: Payer: Self-pay | Admitting: Nurse Practitioner

## 2020-06-29 ENCOUNTER — Other Ambulatory Visit: Payer: Self-pay

## 2020-06-29 VITALS — BP 134/99 | HR 87 | Ht 60.0 in | Wt 346.0 lb

## 2020-06-29 DIAGNOSIS — R072 Precordial pain: Secondary | ICD-10-CM

## 2020-06-29 DIAGNOSIS — I471 Supraventricular tachycardia: Secondary | ICD-10-CM

## 2020-06-29 DIAGNOSIS — I5032 Chronic diastolic (congestive) heart failure: Secondary | ICD-10-CM | POA: Diagnosis not present

## 2020-06-29 DIAGNOSIS — Z01810 Encounter for preprocedural cardiovascular examination: Secondary | ICD-10-CM

## 2020-06-29 DIAGNOSIS — R002 Palpitations: Secondary | ICD-10-CM

## 2020-06-29 DIAGNOSIS — I10 Essential (primary) hypertension: Secondary | ICD-10-CM | POA: Diagnosis not present

## 2020-06-29 DIAGNOSIS — E782 Mixed hyperlipidemia: Secondary | ICD-10-CM

## 2020-06-29 MED ORDER — METOPROLOL SUCCINATE ER 50 MG PO TB24: 75.0000 mg | ORAL_TABLET | Freq: Every day | ORAL | 3 refills | Status: DC

## 2020-06-29 NOTE — Progress Notes (Signed)
Office Visit    Patient Name: Rhonda Palmer Date of Encounter: 06/29/2020  Primary Care Provider:  Franciso Bend, NP Primary Cardiologist:  Debbe Odea, MD  Chief Complaint    50 year old female with a history of morbid obesity, hypertension, hyperlipidemia, diabetes, anxiety, bipolar disorder, palpitations, chest pain, and HFpEF, presents for preoperative cardiovascular valuation pending gastric bypass.  Past Medical History    Past Medical History:  Diagnosis Date  . (HFpEF) heart failure with preserved ejection fraction (HCC)    a. 02/2020 Echo: EF 55-60%, no rwma. Nl RV fxn.  Marland Kitchen Anxiety   . Asthma   . Bipolar 1 disorder (HCC)   . Diabetes mellitus without complication (HCC)   . Edema leg for twenty years per patient   taking lasix for edema  . Fibromyalgia   . History of cardiac catheterization    a. 02/2020 Cath: Nl cors. LVEDP -> torsemide increased to 40mg  daily.  . Hypertension   . Hypothyroidism   . Morbid obesity (HCC)   . Ovarian cyst   . Palpitations    a. 03/2020 Zio: RSR, avg HR 84 (55-203), occas SVT runs. No significant arrhythmias.  Most triggered events associate with sinus rhythm.  04/2020 affective schizophrenia [facility])    Past Surgical History:  Procedure Laterality Date  . ABDOMINAL HYSTERECTOMY    . ANKLE SURGERY     pt. states long time ago  . LEFT HEART CATH AND CORONARY ANGIOGRAPHY N/A 03/13/2020   Procedure: LEFT HEART CATH AND CORONARY ANGIOGRAPHY;  Surgeon: 03/15/2020, MD;  Location: [facility] INVASIVE CV LAB;  Service: Cardiovascular;  Laterality: N/A;    Allergies  Allergies  Allergen Reactions  . Codeine Hives, Itching, Rash and Swelling  . Sulfa Antibiotics Anaphylaxis  . Penicillins Itching    History of Present Illness    50 year old female with the above past medical history including morbid obesity, hypertension, hyperlipidemia, diabetes, anxiety, bipolar disorder, palpitations, chest pain, and HFpEF.  She  was previously evaluated in February 2022 in the setting of chest pain and palpitations.  Initially Zio monitoring and stress testing was ordered however, she subsequently was evaluated in the emergency department with recurrent chest pain prompting admission.  Echocardiogram showed normal LV function.  Diagnostic catheterization showed normal coronary arteries with elevated LVEDP of 40 mmHg and diuretic dosing was increased.  She did complete monitoring which showed predominantly sinus rhythm with occasional, brief SVT runs.  Most triggered events were associated with sinus rhythm and medical therapy was recommended.  She was last seen in clinic in March of this year at which time she was switched from propranolol to Toprol-XL 50 mg daily in the setting of palpitations.  Since then, she has felt relatively well.  She is being evaluated at [facility] for gastric bypass.  She has been working with physical therapy and denies chest pain or dyspnea.  She does have chronic, mild lower extremity swelling which is managed with torsemide 40 mg daily.  She does sometimes have her blood pressure checked at home and it is typically greater than 140 systolic.  She denies PND, orthopnea, dizziness, syncope, or early satiety.  Home Medications    Prior to Admission medications   Medication Sig Start Date End Date Taking? Authorizing Provider  albuterol (PROVENTIL HFA;VENTOLIN HFA) 108 (90 Base) MCG/ACT inhaler Inhale 2 puffs into the lungs every 6 (six) hours as needed for wheezing or shortness of breath.    [provider]  ARIPiprazole ER (  ABILIFY MAINTENA) 300 MG SRER injection Inject 300 mg into the muscle every 28 (twenty-eight) days.    [provider]  ARISTADA INITIO 675 MG/2.4ML prefilled syringe Inject 2.4 ml intramuscularly single dose 02/27/20   [provider]  clonazePAM (KLONOPIN) 0.5 MG tablet Take 0.5 mg by mouth 3 (three) times daily.  02/18/18   [provider]   levothyroxine (SYNTHROID) 200 MCG tablet Take by mouth daily. 06/26/20   [provider]  levothyroxine (SYNTHROID) 50 MCG tablet Take by mouth daily. 06/26/20   [provider]  Levothyroxine Sodium 200 MCG CAPS Take 200 mcg by mouth daily. 02/02/20   [provider]  metoprolol succinate (TOPROL XL) 50 MG 24 hr tablet Take 1 tablet (50 mg total) by mouth daily. 04/10/20   Debbe Odea, MD  ondansetron (ZOFRAN) 4 MG tablet Take 4 mg by mouth every 8 (eight) hours as needed. 02/27/20   [provider]  QUEtiapine (SEROQUEL) 300 MG tablet Take 600 mg by mouth at bedtime. 02/16/20   [provider]  torsemide 40 MG TABS Take 40 mg by mouth daily. 03/18/20   Danford, Earl Lites, MD  zolpidem (AMBIEN) 5 MG tablet Take 5 mg by mouth at bedtime as needed. 03/05/20   [provider]    Review of Systems    She is doing well today.  She has been tolerating physical therapy.  She denies chest pain, dyspnea, palpitations, PND, orthopnea, dizziness, syncope, or early satiety.  She continues to note mild lower extremity swelling.  All other systems reviewed and are otherwise negative except as noted above.  Physical Exam    VS:  BP (!) 134/99 (BP Location: Right Wrist, Patient Position: Sitting, Cuff Size: Large)   Pulse 87   Ht 5' (1.524 m)   Wt (!) 346 lb (156.9 kg)   SpO2 94%   BMI 67.57 kg/m  , BMI Body mass index is 67.57 kg/m. GEN: Obese, in no acute distress. HEENT: normal. Neck: Supple, obese, difficult to gauge JVP.  No carotid bruits, or masses. Cardiac: RRR, no murmurs, rubs, or gallops. No clubbing, cyanosis, trace bilateral ankle edema.  Radials/PT 2+ and equal bilaterally.  Respiratory:  Respirations regular and unlabored, clear to auscultation bilaterally. GI: Obese, soft, nontender, nondistended, BS + x 4. MS: no deformity or atrophy. Skin: warm and dry, no rash. Neuro:  Strength and sensation are intact. Psych: Normal  affect.  Accessory Clinical Findings    ECG personally reviewed by me today -regular sinus rhythm, 87 - no acute changes.  Lab Results  Component Value Date   WBC 10.3 03/15/2020   HGB 11.4 (L) 03/15/2020   HCT 37.1 03/15/2020   MCV 90.9 03/15/2020   PLT 243 03/15/2020   Lab Results  Component Value Date   CREATININE 0.69 03/18/2020   BUN 22 (H) 03/18/2020   NA 135 03/18/2020   K 3.5 03/18/2020   CL 89 (L) 03/18/2020   CO2 37 (H) 03/18/2020   Lab Results  Component Value Date   ALT 18 11/20/2019   AST 18 11/20/2019   ALKPHOS 91 11/20/2019   BILITOT 0.7 11/20/2019   Lab Results  Component Value Date   CHOL 255 (H) 03/12/2020   HDL 73 03/12/2020   LDLCALC 147 (H) 03/12/2020   TRIG 177 (H) 03/12/2020   CHOLHDL 3.5 03/12/2020    Lab Results  Component Value Date   HGBA1C 6.4 (H) 03/12/2020    Assessment & Plan  1.  Preoperative cardiovascular examination: Patient with a prior history of chest pain, dyspnea, and palpitations with previous work-up including normal echocardiogram and diagnostic catheterization in February 2022.  Monitoring showed occasional, brief episodes of SVT but otherwise sinus rhythm.  She has been doing well since her last office visit.  She has been working with physical therapy and tolerating this well.  She denies any chest pain or dyspnea today.  She is now pending gastric bypass.  Risk of major cardiac event in the perioperative period calculates to 0.9%.  She does not require any additional testing at this time, given prior work-up.  I do recommend continuation of beta-blocker therapy throughout the perioperative period.  We will also need to watch volume closely given history of chronic heart failure with preserved ejection fraction.  2.  Chronic heart failure with preserved ejection fraction: Volume difficult to gauge though she does not appear to be significantly volume overloaded.  Only trace lower extremity swelling.  Increasing metoprolol  in the setting of elevated blood pressures.  Continue current dose of torsemide.    3.  Essential hypertension: Blood pressure moderately elevated today and she notes it is usually higher than this at home.  Heart rate is 87.  I am increasing her Toprol-XL to 75 mg daily.  She will continue to follow blood pressure at home.  4.  History of palpitations/PSVT: Quiescent on beta-blocker therapy.  5.  Hyperlipidemia: LDL of 147 in February with normal coronary arteries on catheterization.  10-year risk of major cardiac event calculates to 14.8%.  With normal coronary arteries, no role for statin at this time (appears she was previously on Lipitor).  Suspect lipids will improve greatly following weight loss surgery.  6.  History of chest pain: Quiescent.  Normal coronary arteries on catheterization.  Continue medical therapy with beta-blocker.  7.  Type 2 diabetes mellitus: A1c 6.4 in February.  She will be greatly helped with weight loss surgery.  8.  Disposition: Follow-up in clinic in 4 to 6 months or sooner if necessary.  Nicolasa Ducking, NP 06/29/2020, 12:17 PM

## 2020-06-29 NOTE — Patient Instructions (Addendum)
Medication Instructions:  Your physician has recommended you make the following change in your medication:   1. INCREASE Metoprolol Succinate (Toprol XL) 50 mg and take one and one half tablet (75 mg) once daily   *If you need a refill on your cardiac medications before your next appointment, please call your pharmacy*   Lab Work: None If you have labs (blood work) drawn today and your tests are completely normal, you will receive your results only by: Marland Kitchen MyChart Message (if you have MyChart) OR . A paper copy in the mail If you have any lab test that is abnormal or we need to change your treatment, we will call you to review the results.   Testing/Procedures: None   Follow-Up: At [facility], you and your health needs are our priority.  As part of our continuing mission to provide you with exceptional heart care, we have created designated Provider Care Teams.  These Care Teams include your primary Cardiologist (physician) and Advanced Practice Providers (APPs -  Physician Assistants and Nurse Practitioners) who all work together to provide you with the care you need, when you need it.  We recommend signing up for the patient portal called "MyChart".  Sign up information is provided on this After Visit Summary.  MyChart is used to connect with patients for Virtual Visits (Telemedicine).  Patients are able to view lab/test results, encounter notes, upcoming appointments, etc.  Non-urgent messages can be sent to your provider as well.   To learn more about what you can do with MyChart, go to [email or web address].    Your next appointment:   6 month(s)  The format for your next appointment:   In Person  Provider:   Debbe Odea, MD or Nicolasa Ducking, NP

## 2020-07-02 ENCOUNTER — Encounter: Payer: Medicaid Other | Admitting: Physical Therapy

## 2020-07-04 ENCOUNTER — Other Ambulatory Visit: Payer: Self-pay

## 2020-07-04 ENCOUNTER — Ambulatory Visit: Payer: Medicaid Other | Attending: Orthopedic Surgery | Admitting: Physical Therapy

## 2020-07-04 DIAGNOSIS — M5431 Sciatica, right side: Secondary | ICD-10-CM | POA: Diagnosis present

## 2020-07-04 DIAGNOSIS — M5416 Radiculopathy, lumbar region: Secondary | ICD-10-CM | POA: Diagnosis not present

## 2020-07-04 DIAGNOSIS — M6281 Muscle weakness (generalized): Secondary | ICD-10-CM | POA: Diagnosis present

## 2020-07-04 NOTE — Therapy (Addendum)
[place] [facility] [address] [place], [place], [address] Phone: [phone]   Fax:  [phone]  Physical Therapy Re-evaluation/Recertification   Dates of reporting period  06/04/20   to   07/04/20   Patient Details  Name: Rhonda Palmer MRN: [id number] Date of Birth: [birth year] Referring Provider (PT): Reche Dixon, [facility]   Encounter Date: 07/04/2020   PT End of Session - 07/05/20 1050     Visit Number 5    Number of Visits 12    Date for PT Re-Evaluation 08/16/20    Authorization Type Medicaid (12 visits between 5/11-6/21)    Authorization Time Period re-cert/PN done on 02/02/49    Authorization - Visit Number 1    Authorization - Number of Visits 12    Progress Note Due on Visit 12    PT Start Time 0900    PT Stop Time 0930    PT Time Calculation (min) 30 min    Activity Tolerance Patient limited by pain;Patient tolerated treatment well    Behavior During Therapy Restless;Anxious             Past Medical History:  Diagnosis Date   (HFpEF) heart failure with preserved ejection fraction ([place])    a. 02/2020 Echo: EF 55-60%, no rwma. Nl RV fxn.   Anxiety    Asthma    Bipolar 1 disorder ([place])    Diabetes mellitus without complication ([place])    Edema leg for twenty years per patient   taking lasix for edema   Fibromyalgia    History of cardiac catheterization    a. 02/2020 Cath: Nl cors. LVEDP 107mHg -> torsemide increased to 464mdaily.   Hypertension    Hypothyroidism    [place]bid [place] ([place]   Ovarian cyst    Palpitations    a. 03/2020 Zio: RSR, avg HR 84 (55-203), occas SVT runs. No significant arrhythmias.  Most triggered events associate with sinus rhythm.   [facility]    Past Surgical History:  Procedure Laterality Date   ABDOMINAL HYSTERECTOMY     ANKLE SURGERY     pt. states long time ago   LEFT HEART CATH AND CORONARY ANGIOGRAPHY N/A 03/13/2020   Procedure: LEFT HEART CATH AND  CORONARY ANGIOGRAPHY;  Surgeon: EnNelva BushMD;  [facility];  Service: Cardiovascular;  Laterality: N/A;    There were no vitals filed for this visit.   Subjective Assessment - 07/04/20 0906     Subjective Patient reports she was debilitated and unable to move last week with her pain. She is working on weight loss goals and is awaiting gastric bypass. Patient reports she has made notable progress to date. She feels she has better ROM and is able to move better. Patient has gastric bypass scheduled for next month. She reports she fell when attempting to take out trash; her daughter has been taking out trash for her. She is able to walk full length of her breezeway. Patient reports that her pain level is just as bad, but her mobility and range has notably improved. Pt is using Tramadol 2x/day and she feels this is helping. She feels her balance and steadiness on her feet has improved.    Pertinent History complex medical hx    Limitations Sitting;Lifting;Standing;Walking;House hold activities    How long can you sit comfortably? few minutes    How long can you walk comfortably? few minutes    Patient Stated Goals  Pt 's goal is to improve her balance and be more steady on her feet and to perform her activities at home without being limited by LBP    Currently in Pain? Yes    Pain Score 7     Pain Location Back    Pain Orientation Right    Pain Radiating Towards Radiating down RLE as far as her R foot    Pain Onset 1 to 4 weeks ago             Treatment Today:   Therapeutic activities - re-assessment of ROM, strength, gait, posture, palpation. Patient education (see below).  Gait: antalgic with dec stance time RLE, upright posture during gait. Abductor lurch R. Guarded posture throughout gait cycle.  Lumbar AROM Flexion 75% pain low back/hip Extension 75% pain in low back SB R 75% pain in leg SB L 100% mild back pain Rotate R/L 50% minimal pain   MMT Hip  flexion: 4-/5 bilaterally Knee extension: 4/5 bilaterally Knee flexion 4/5 bilaterally Ankle DF: R 5/5, L 4+/5   Lower trunk rotations; x10 alternating, for review of home exercise and reiteration of daily lumbar spine AROM work   Manual therapy - for desensitization, symptom modulation, soft tissue mobility STM and IASTM with use of Hypervolt performed to R>L erector spinae, R>L QL, and R gluteal mm          PT Education - 07/05/20 1049     Education Details Discussed with patient current progress attained in PT, prognosis, continued plan of care. Discussed with patient role of pain as "alarm system" and basic pain neuroscience principles. Discussed benefit of light aerobic work most days of the week to decrease sensitization and continued HEP. Emphasized importance of consistent PT attendance with patient.    Person(s) Educated Patient    Methods Explanation;Demonstration    Comprehension Verbalized understanding;Returned demonstration              PT Short Term Goals - 07/05/20 1057       PT SHORT TERM GOAL #1   Title pt will be independent with HEP for lumbar spine AROM/strength    Baseline 5/9: progressing, 6/8: compliant with exception of flare-up of back pain last week    Time 4    Period Weeks    Status On-going    Target Date 06/19/20               PT Long Term Goals - 07/05/20 1058       PT LONG TERM GOAL #1   Title Improve FOTO from 10 to 41 indicating improved ability to participate in daily activities without being limited by LBP    Baseline 4/26: 10; 6/9: 35    Time 6    Period Weeks    Status On-going      PT LONG TERM GOAL #2   Title Pt will demonstrate ability to peform hip hinge squat/lift technique for lifting and reaching items on low surface or floor without verbal cues on form    Baseline Unable to perform today secondary to limited lumbar spine mobility    Time 6    Period Weeks    Status Not Met      PT LONG TERM GOAL #3    Title Pt will be able to amb from her apartment to the road and back wheeling her garbage can without loss of balance    Baseline Unable to complete at this time, history of fall with most recent  attempt    Time 6    Period Weeks    Status On-going                  Plan - 07/05/20 1056     Clinical Impression Statement Given clinically significant increase in pain relative to that reported at last session, limited standing exercise today and focused on manual therapy to address right lower quarter sensitivity and soft tissue mobility. Patient responds well to conservative treatment today, but she does have mild discomfort with lower trunk rotations performed following manual therapy. She has ongoing R lower quarter pain with RLE referral pattern as far as her R ankle/foot. Patient demonstrates modestly improved ROM and strength compared to initial eval and reports subjective improvement in ability to move about her home and improved ROM. She has notably improved FOTO score compared to the beginning of therapy. Progress has been limited by limited attendance (significant pain and mobility deficits, limited transportation) and morbid obesity. Pt does have upcoming gastric bypass to address her weight. Patient will benefit from continued skilled PT intervention to address remaining pain, ROM and mobility deficits, and tolerance of upright and standing activity as needed for best return to PLOF.    Personal Factors and Comorbidities Comorbidity 3+    Examination-Activity Limitations Bathing;Bed Mobility;Bend;Hygiene/Grooming;Lift;Stand;Squat;Sit;Locomotion Level;Transfers    Examination-Participation Restrictions Cleaning;Meal Prep    Stability/Clinical Decision Making Evolving/Moderate complexity    Rehab Potential Good    PT Frequency 2x / week    PT Duration 6 weeks    PT Treatment/Interventions ADLs/Self Care Home Management;Moist Heat;Gait training;Functional mobility training;Therapeutic  activities;Therapeutic exercise;Balance training;Neuromuscular re-education;Manual techniques;Patient/family education    PT Next Visit Plan Lumbar spine AROM, LE/core mm retraining, posture and body mechanics training    PT Home Exercise Plan [facility]    Consulted and Agree with Plan of Care Patient                Patient will benefit from skilled therapeutic intervention in order to improve the following deficits and impairments:  Abnormal gait, Decreased range of motion, Difficulty walking, Decreased endurance, Pain, Decreased activity tolerance, Decreased balance, Hypomobility, Improper body mechanics, Postural dysfunction, Increased edema, Decreased strength, Decreased mobility, Impaired sensation  Visit Diagnosis: Radiculopathy, lumbar region  Sciatica, right side  Muscle weakness (generalized)     Problem List Patient Active Problem List   Diagnosis Date Noted   Unstable angina ([place])    Chest pain 03/11/2020   Normocytic anemia 03/11/2020   Bipolar 1 disorder ([place])    Depression with anxiety    Hypertension    HLD (hyperlipidemia)    Diabetes mellitus without complication ([place])    Morbid obesity with BMI of 60.0-69.9, adult ([place])    Acute respiratory failure with hypoxia (HCC)    Dyspnea on exertion    Fall 03/27/2017   Long term (current) use of opiate analgesic 06/26/2016   Long term prescription opiate use 06/26/2016   Opiate use 06/26/2016   Obesity 01/17/2016   History of opioid abuse ([place]) 01/17/2016   Asthma 01/17/2016   Mixed hyperlipidemia 10/09/2015   Hypokalemia 10/09/2015   Headache above the eye region 06/16/2013   Edema of extremities 05/18/2013   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[place]) 04/12/2013   GERD (gastroesophageal reflux disease) 04/11/2013   Personality disorder ([place]) 10/16/2012   Chronic pain syndrome 06/18/2012   S/P ankle fusion 05/21/2012   Schizoaffective disorder, bipolar type ([place]) 02/20/2012   Thromboembolism of  deep veins of lower extremity (  [place]) 04/01/2010   Traumatic arthropathy, unspecified ankle and foot 12/16/2007   Hypothyroidism 11/16/2002   *Addendum for progress note formatting  Valentina Gu, PT, DPT [phone] Eilleen Kempf 07/05/2020, 12:57 PM  [facility]  [facility] [address] [place], [place], [address] Phone: [phone]   Fax:  [phone]  Name: Rhonda Palmer MRN: [id number] Date of Birth: September 07, 1970

## 2020-07-09 ENCOUNTER — Other Ambulatory Visit: Payer: Self-pay

## 2020-07-09 ENCOUNTER — Ambulatory Visit: Payer: Medicaid Other | Admitting: Physical Therapy

## 2020-07-09 DIAGNOSIS — M5416 Radiculopathy, lumbar region: Secondary | ICD-10-CM | POA: Diagnosis not present

## 2020-07-09 DIAGNOSIS — M5431 Sciatica, right side: Secondary | ICD-10-CM

## 2020-07-09 DIAGNOSIS — M6281 Muscle weakness (generalized): Secondary | ICD-10-CM

## 2020-07-09 NOTE — Therapy (Signed)
[facility] [address] [place], [place], [address] Phone: [phone]   Fax:  [phone]  Physical Therapy Treatment  Patient Details  Name: Rhonda Palmer MRN: [id number] Date of Birth: 03-04-1970 Referring Provider (PT): Reche Dixon, [facility]   Encounter Date: 07/09/2020   PT End of Session - 07/09/20 0951     Visit Number 6    Number of Visits 12    Date for PT Re-Evaluation 08/16/20    Authorization Type Medicaid (12 visits between 5/11-6/21)    Authorization Time Period re-cert/PN done on 07/01/38    Authorization - Visit Number 2    Authorization - Number of Visits 12    Progress Note Due on Visit 12    PT Start Time 0847    PT Stop Time 0935    PT Time Calculation (min) 48 min    Activity Tolerance Patient limited by pain;Patient tolerated treatment well    Behavior During Therapy Restless;Anxious             Past Medical History:  Diagnosis Date   (HFpEF) heart failure with preserved ejection fraction ([place])    a. 02/2020 Echo: EF 55-60%, no rwma. Nl RV fxn.   Anxiety    Asthma    Bipolar 1 disorder ([place])    Diabetes mellitus without complication ([place])    Edema leg for twenty years per patient   taking lasix for edema   Fibromyalgia    History of cardiac catheterization    a. 02/2020 Cath: Nl cors. LVEDP 64mHg -> torsemide increased to 424mdaily.   Hypertension    Hypothyroidism    Morbid obesity ([place]   Ovarian cyst    Palpitations    a. 03/2020 Zio: RSR, avg HR 84 (55-203), occas SVT runs. No significant arrhythmias.  Most triggered events associate with sinus rhythm.   [facility]    Past Surgical History:  Procedure Laterality Date   ABDOMINAL HYSTERECTOMY     ANKLE SURGERY     pt. states long time ago   LEFT HEART CATH AND CORONARY ANGIOGRAPHY N/A 03/13/2020   Procedure: LEFT HEART CATH AND CORONARY ANGIOGRAPHY;  Surgeon: EnNelva BushMD;  [facility];  Service: Cardiovascular;  Laterality: N/A;    There were no vitals filed for this visit.   Subjective Assessment - 07/09/20 0849     Subjective Patient reports having bad day in regard to back pain at arrival to PT. She reports pain referring to R gluteal region and down to R thigh. She reports compliance with HEP. 8/10 pain at arrival today. She reports no recent episodes of pain causing her to be bed ridden since her last visit. She reports walking on treadmill and continuing exercise outside of clinic.    Pertinent History complex medical hx    Limitations Sitting;Lifting;Standing;Walking;House hold activities    How long can you sit comfortably? few minutes    How long can you walk comfortably? few minutes    Patient Stated Goals Pt 's goal is to improve her balance and be more steady on her feet and to perform her activities at home without being limited by LBP    Pain Score 8     Pain Location Back    Pain Orientation Right    Pain Radiating Towards R thigh    Pain Onset 1 to 4 weeks ago  Gait: patient exhibits mild forward flexed posture throughout gait cycle and mild abductor lurch with ipsilateral stance phase. Decreased gait velocity and step length bilat    Treatment Today:  Manual therapy - for desensitization, symptom modulation, soft tissue mobility STM and IASTM with use of Hypervolt performed to R>L erector spinae, R>L QL, and R gluteal mm    Therapeutic Exercises -  for thoracolumbar mobility, dural/neural mobility, graded movement   Lower trunk rotations; x10 alternating in hooklying Seated pelvic tilt; 12-6; 2x10 Seated lumbar spine flexion rollout, 3-way (forward, 45 deg R and L), Swiss ball on floor; x5, 5 sec  Seated sciatic floss; x20, alternating (with conjunct cervical spine extension)  Alternating isometrics hip ABD and ADD; 2x10, 5 sec   Seated toe raises 2x10 Seated heel raises 2x10  Sit to stand with raised table; x15  [verbal and tactile cues for upright posture]      [*not today*] Attempted standing hip abd x 5 ea, however pt not able to continue and request seated break due to report of LBP Seated scapular retraction x10 Seated TA bracing: 5 sec holds x 20 (pt hands on abdomen for tactile cues)   Patient response to intervention 4-5/10 pain following intervention versus 8/10 pain at arrival.    ASSESSMENT Patient does have heightened pain and notable activity intolerance associated with generalized pain disorder/fibromyalgia and likely associated with weight status. Patient has clinically meaningful reduction in pain with use of manual therapies and gentle unloaded activity today. Patient tolerates neurodynamics well without exacerbation of LE referred symptoms. Patient does have moderate pain with sit to stand in limited ROM today, though symptoms are not exacerbated to that reported at arrival. Patient's prognosis is complicated by aforementioned comorbidities and complicated medical history. Patient will benefit from continued skilled PT intervention to address remaining pain, sensitization, ROM and mobility deficits, and tolerance of upright and standing activity as needed for best return to PLOF.     PT Short Term Goals - 07/05/20 1057       PT SHORT TERM GOAL #1   Title pt will be independent with HEP for lumbar spine AROM/strength    Baseline 5/9: progressing, 6/8: compliant with exception of flare-up of back pain last week    Time 4    Period Weeks    Status On-going    Target Date 06/19/20               PT Long Term Goals - 07/05/20 1058       PT LONG TERM GOAL #1   Title Improve FOTO from 10 to 41 indicating improved ability to participate in daily activities without being limited by LBP    Baseline 4/26: 10; 6/9: 35    Time 6    Period Weeks    Status On-going      PT LONG TERM GOAL #2   Title Pt will demonstrate ability to peform hip hinge squat/lift technique for  lifting and reaching items on low surface or floor without verbal cues on form    Baseline Unable to perform today secondary to limited lumbar spine mobility    Time 6    Period Weeks    Status Not Met      PT LONG TERM GOAL #3   Title Pt will be able to amb from her apartment to the road and back wheeling her garbage can without loss of balance    Baseline Unable to complete at this time, history of fall with  most recent attempt    Time 6    Period Weeks    Status On-going                   Plan - 07/09/20 0952     Clinical Impression Statement Patient does have heightened pain and notable activity intolerance associated with generalized pain disorder/fibromyalgia and likely associated with weight status. Patient has clinically meaningful reduction in pain with use of manual therapies and gentle unloaded activity today. Patient tolerates neurodynamic well without exacerbation of LE referred symptoms. Patient does have moderate pain with sit to stand in limited ROM today, though symptoms are not exacerbated to that reported at arrival. Patient's prognosis is complicated by aforementioned comorbidities and complicated medical history. Patient will benefit from continued skilled PT intervention to address remaining pain, sensitization, ROM and mobility deficits, and tolerance of upright and standing activity as needed for best return to PLOF.    Personal Factors and Comorbidities Comorbidity 3+    Examination-Activity Limitations Bathing;Bed Mobility;Bend;Hygiene/Grooming;Lift;Stand;Squat;Sit;Locomotion Level;Transfers    Examination-Participation Restrictions Cleaning;Meal Prep    Stability/Clinical Decision Making Evolving/Moderate complexity    Rehab Potential Good    PT Frequency 2x / week    PT Duration 6 weeks    PT Treatment/Interventions ADLs/Self Care Home Management;Moist Heat;Gait training;Functional mobility training;Therapeutic activities;Therapeutic exercise;Balance  training;Neuromuscular re-education;Manual techniques;Patient/family education    PT Next Visit Plan Lumbar spine AROM, LE/core mm retraining, posture and body mechanics training    PT Home Exercise Plan Access Code [facility]    Consulted and Agree with Plan of Care Patient             Patient will benefit from skilled therapeutic intervention in order to improve the following deficits and impairments:  Abnormal gait, Decreased range of motion, Difficulty walking, Decreased endurance, Pain, Decreased activity tolerance, Decreased balance, Hypomobility, Improper body mechanics, Postural dysfunction, Increased edema, Decreased strength, Decreased mobility, Impaired sensation  Visit Diagnosis: Radiculopathy, lumbar region  Sciatica, right side  Muscle weakness (generalized)     Problem List Patient Active Problem List   Diagnosis Date Noted   Unstable angina ([place])    Chest pain 03/11/2020   Normocytic anemia 03/11/2020   Bipolar 1 disorder ([place])    Depression with anxiety    Hypertension    HLD (hyperlipidemia)    Diabetes mellitus without complication ([place])    Morbid obesity with BMI of 60.0-69.9, adult ([place])    Acute respiratory failure with hypoxia ([facility])    Dyspnea on exertion    Fall 03/27/2017   Long term (current) use of opiate analgesic 06/26/2016   Long term prescription opiate use 06/26/2016   Opiate use 06/26/2016   Obesity 01/17/2016   History of opioid abuse ([place]) 01/17/2016   Asthma 01/17/2016   Mixed hyperlipidemia 10/09/2015   Hypokalemia 10/09/2015   Headache above the eye region 06/16/2013   Edema of extremities 05/18/2013   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[place]) 04/12/2013   GERD (gastroesophageal reflux disease) 04/11/2013   Personality disorder ([place]) 10/16/2012   Chronic pain syndrome 06/18/2012   S/P ankle fusion 05/21/2012   Schizoaffective disorder, bipolar type ([place]) 02/20/2012   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 [place], [place], [address] Phone: [phone]   Fax:  [phone]  Name: Rhonda Palmer MRN: [id number] Date of Birth: 01/02/1971

## 2020-07-11 ENCOUNTER — Encounter: Payer: Medicaid Other | Admitting: Physical Therapy

## 2020-07-16 ENCOUNTER — Encounter: Payer: Medicaid Other | Admitting: Physical Therapy

## 2020-07-18 ENCOUNTER — Ambulatory Visit: Payer: Medicaid Other | Admitting: Physical Therapy

## 2020-07-23 ENCOUNTER — Encounter: Payer: Medicaid Other | Admitting: Physical Therapy

## 2020-07-25 ENCOUNTER — Ambulatory Visit: Payer: Medicaid Other | Admitting: Physical Therapy

## 2020-08-01 ENCOUNTER — Encounter: Payer: Medicaid Other | Admitting: Physical Therapy

## 2020-10-12 ENCOUNTER — Other Ambulatory Visit: Payer: Self-pay | Admitting: Adult Health

## 2020-10-12 DIAGNOSIS — M25551 Pain in right hip: Secondary | ICD-10-CM

## 2020-10-16 ENCOUNTER — Ambulatory Visit: Payer: Medicaid Other | Admitting: Cardiology

## 2020-12-04 ENCOUNTER — Emergency Department: Payer: Medicaid Other

## 2020-12-04 ENCOUNTER — Encounter: Payer: Self-pay | Admitting: Emergency Medicine

## 2020-12-04 ENCOUNTER — Other Ambulatory Visit: Payer: Self-pay

## 2020-12-04 ENCOUNTER — Inpatient Hospital Stay
Admission: EM | Admit: 2020-12-04 | Discharge: 2020-12-06 | DRG: 206 | Disposition: A | Discharge status: Home / Self Care | Payer: Medicaid Other | Attending: Obstetrics and Gynecology | Admitting: Obstetrics and Gynecology

## 2020-12-04 DIAGNOSIS — J9621 Acute and chronic respiratory failure with hypoxia: Secondary | ICD-10-CM | POA: Diagnosis present

## 2020-12-04 DIAGNOSIS — F259 Schizoaffective disorder, unspecified: Secondary | ICD-10-CM | POA: Diagnosis present

## 2020-12-04 DIAGNOSIS — E119 Type 2 diabetes mellitus without complications: Secondary | ICD-10-CM

## 2020-12-04 DIAGNOSIS — I110 Hypertensive heart disease with heart failure: Secondary | ICD-10-CM | POA: Diagnosis present

## 2020-12-04 DIAGNOSIS — J9601 Acute respiratory failure with hypoxia: Secondary | ICD-10-CM

## 2020-12-04 DIAGNOSIS — R519 Headache, unspecified: Secondary | ICD-10-CM | POA: Diagnosis present

## 2020-12-04 DIAGNOSIS — Z79899 Other long term (current) drug therapy: Secondary | ICD-10-CM

## 2020-12-04 DIAGNOSIS — E785 Hyperlipidemia, unspecified: Secondary | ICD-10-CM | POA: Diagnosis present

## 2020-12-04 DIAGNOSIS — T43505A Adverse effect of unspecified antipsychotics and neuroleptics, initial encounter: Secondary | ICD-10-CM | POA: Diagnosis present

## 2020-12-04 DIAGNOSIS — Z87892 Personal history of anaphylaxis: Secondary | ICD-10-CM

## 2020-12-04 DIAGNOSIS — R0902 Hypoxemia: Secondary | ICD-10-CM | POA: Diagnosis present

## 2020-12-04 DIAGNOSIS — E662 Morbid (severe) obesity with alveolar hypoventilation: Principal | ICD-10-CM | POA: Diagnosis present

## 2020-12-04 DIAGNOSIS — Z880 Allergy status to penicillin: Secondary | ICD-10-CM

## 2020-12-04 DIAGNOSIS — Z8249 Family history of ischemic heart disease and other diseases of the circulatory system: Secondary | ICD-10-CM

## 2020-12-04 DIAGNOSIS — G2401 Drug induced subacute dyskinesia: Secondary | ICD-10-CM | POA: Diagnosis present

## 2020-12-04 DIAGNOSIS — I471 Supraventricular tachycardia: Secondary | ICD-10-CM | POA: Diagnosis present

## 2020-12-04 DIAGNOSIS — F419 Anxiety disorder, unspecified: Secondary | ICD-10-CM | POA: Diagnosis present

## 2020-12-04 DIAGNOSIS — F319 Bipolar disorder, unspecified: Secondary | ICD-10-CM | POA: Diagnosis present

## 2020-12-04 DIAGNOSIS — M797 Fibromyalgia: Secondary | ICD-10-CM | POA: Diagnosis present

## 2020-12-04 DIAGNOSIS — K219 Gastro-esophageal reflux disease without esophagitis: Secondary | ICD-10-CM | POA: Diagnosis present

## 2020-12-04 DIAGNOSIS — Z20822 Contact with and (suspected) exposure to covid-19: Secondary | ICD-10-CM | POA: Diagnosis present

## 2020-12-04 DIAGNOSIS — Z79890 Hormone replacement therapy (postmenopausal): Secondary | ICD-10-CM

## 2020-12-04 DIAGNOSIS — Z885 Allergy status to narcotic agent status: Secondary | ICD-10-CM

## 2020-12-04 DIAGNOSIS — J9611 Chronic respiratory failure with hypoxia: Secondary | ICD-10-CM | POA: Diagnosis present

## 2020-12-04 DIAGNOSIS — E039 Hypothyroidism, unspecified: Secondary | ICD-10-CM | POA: Diagnosis present

## 2020-12-04 DIAGNOSIS — Z23 Encounter for immunization: Secondary | ICD-10-CM

## 2020-12-04 DIAGNOSIS — F609 Personality disorder, unspecified: Secondary | ICD-10-CM | POA: Diagnosis present

## 2020-12-04 DIAGNOSIS — Z79891 Long term (current) use of opiate analgesic: Secondary | ICD-10-CM

## 2020-12-04 DIAGNOSIS — Z90710 Acquired absence of both cervix and uterus: Secondary | ICD-10-CM

## 2020-12-04 DIAGNOSIS — J45909 Unspecified asthma, uncomplicated: Secondary | ICD-10-CM | POA: Diagnosis present

## 2020-12-04 DIAGNOSIS — I5032 Chronic diastolic (congestive) heart failure: Secondary | ICD-10-CM | POA: Diagnosis present

## 2020-12-04 DIAGNOSIS — Z6841 Body Mass Index (BMI) 40.0 and over, adult: Secondary | ICD-10-CM

## 2020-12-04 DIAGNOSIS — Z882 Allergy status to sulfonamides status: Secondary | ICD-10-CM

## 2020-12-04 LAB — URINALYSIS, ROUTINE W REFLEX MICROSCOPIC
Bilirubin Urine: NEGATIVE
Glucose, UA: >=500 mg/dL — AB
Hgb urine dipstick: NEGATIVE
Ketones, ur: 20 mg/dL — AB
Leukocytes,Ua: NEGATIVE
Nitrite: NEGATIVE
Protein, ur: 30 mg/dL — AB
Specific Gravity, Urine: 1.029 (ref 1.005–1.030)
pH: 6 (ref 5.0–8.0)

## 2020-12-04 LAB — CBC
HCT: 44.5 % (ref 36.0–46.0)
Hemoglobin: 13.3 g/dL (ref 12.0–15.0)
MCH: 28.4 pg (ref 26.0–34.0)
MCHC: 29.9 g/dL — ABNORMAL LOW (ref 30.0–36.0)
MCV: 94.9 fL (ref 80.0–100.0)
Platelets: 274 10*3/uL (ref 150–400)
RBC: 4.69 MIL/uL (ref 3.87–5.11)
RDW: 15.5 % (ref 11.5–15.5)
WBC: 9.5 10*3/uL (ref 4.0–10.5)
nRBC: 2.1 % — ABNORMAL HIGH (ref 0.0–0.2)

## 2020-12-04 LAB — BASIC METABOLIC PANEL
Anion gap: 8 (ref 5–15)
BUN: 10 mg/dL (ref 6–20)
CO2: 32 mmol/L (ref 22–32)
Calcium: 9 mg/dL (ref 8.9–10.3)
Chloride: 97 mmol/L — ABNORMAL LOW (ref 98–111)
Creatinine, Ser: 0.63 mg/dL (ref 0.44–1.00)
GFR, Estimated: >60 mL/min (ref >60)
Glucose, Bld: 114 mg/dL — ABNORMAL HIGH (ref 70–99)
Potassium: 3.7 mmol/L (ref 3.5–5.1)
Sodium: 137 mmol/L (ref 135–145)

## 2020-12-04 LAB — HEPATIC FUNCTION PANEL
ALT: 22 U/L (ref 0–44)
AST: 18 U/L (ref 15–41)
Albumin: 3.9 g/dL (ref 3.5–5.0)
Alkaline Phosphatase: 91 U/L (ref 38–126)
Bilirubin, Direct: <0.1 mg/dL (ref 0.0–0.2)
Total Bilirubin: 0.6 mg/dL (ref 0.3–1.2)
Total Protein: 7.6 g/dL (ref 6.5–8.1)

## 2020-12-04 LAB — BRAIN NATRIURETIC PEPTIDE: B Natriuretic Peptide: 67.8 pg/mL (ref 0.0–100.0)

## 2020-12-04 LAB — TSH: TSH: 19.908 u[IU]/mL — ABNORMAL HIGH (ref 0.350–4.500)

## 2020-12-04 LAB — T4, FREE: Free T4: 0.83 ng/dL (ref 0.61–1.12)

## 2020-12-04 LAB — RESP PANEL BY RT-PCR (FLU A&B, COVID) ARPGX2
Influenza A by PCR: NEGATIVE
Influenza B by PCR: NEGATIVE
SARS Coronavirus 2 by RT PCR: NEGATIVE

## 2020-12-04 LAB — LIPASE, BLOOD: Lipase: 22 U/L (ref 11–51)

## 2020-12-04 LAB — TROPONIN I (HIGH SENSITIVITY): Troponin I (High Sensitivity): 6 ng/L (ref <18)

## 2020-12-04 LAB — POC URINE PREG, ED: Preg Test, Ur: NEGATIVE

## 2020-12-04 MED ORDER — ACETAMINOPHEN 500 MG PO TABS
1000.0000 mg | ORAL_TABLET | Freq: Once | ORAL | Status: AC
  Administered 2020-12-04: 1000 mg via ORAL
  Filled 2020-12-04: qty 2

## 2020-12-04 MED ORDER — IPRATROPIUM-ALBUTEROL 0.5-2.5 (3) MG/3ML IN SOLN: 3.0000 mL | Freq: Four times a day (QID) | RESPIRATORY_TRACT | Status: DC | PRN

## 2020-12-04 MED ORDER — ARIPIPRAZOLE LAUROXIL ER 1064 MG/3.9ML IM PRSY: 1064.0000 mg | PREFILLED_SYRINGE | INTRAMUSCULAR | Status: DC

## 2020-12-04 MED ORDER — ONDANSETRON HCL 4 MG PO TABS: 4.0000 mg | ORAL_TABLET | Freq: Three times a day (TID) | ORAL | Status: DC | PRN

## 2020-12-04 MED ORDER — ARIPIPRAZOLE ER 300 MG IM SRER
300.0000 mg | INTRAMUSCULAR | Status: DC
  Filled 2020-12-04: qty 1.5

## 2020-12-04 MED ORDER — MAGNESIUM SULFATE IN D5W 1-5 GM/100ML-% IV SOLN
1.0000 g | Freq: Once | INTRAVENOUS | Status: DC
  Filled 2020-12-04: qty 100

## 2020-12-04 MED ORDER — IPRATROPIUM-ALBUTEROL 0.5-2.5 (3) MG/3ML IN SOLN
3.0000 mL | Freq: Once | RESPIRATORY_TRACT | Status: AC
  Administered 2020-12-04: 3 mL via RESPIRATORY_TRACT

## 2020-12-04 MED ORDER — IPRATROPIUM-ALBUTEROL 0.5-2.5 (3) MG/3ML IN SOLN
3.0000 mL | Freq: Once | RESPIRATORY_TRACT | Status: AC
  Administered 2020-12-04: 3 mL via RESPIRATORY_TRACT
  Filled 2020-12-04: qty 3

## 2020-12-04 MED ORDER — HYDRALAZINE HCL 20 MG/ML IJ SOLN: 10.0000 mg | Freq: Four times a day (QID) | INTRAMUSCULAR | Status: DC | PRN

## 2020-12-04 MED ORDER — ALBUTEROL SULFATE (2.5 MG/3ML) 0.083% IN NEBU: 2.5000 mg | INHALATION_SOLUTION | Freq: Four times a day (QID) | RESPIRATORY_TRACT | Status: DC | PRN

## 2020-12-04 MED ORDER — ENOXAPARIN SODIUM 80 MG/0.8ML IJ SOSY
0.5000 mg/kg | PREFILLED_SYRINGE | INTRAMUSCULAR | Status: DC
  Administered 2020-12-04 – 2020-12-05 (×2): 72.5 mg via SUBCUTANEOUS
  Filled 2020-12-04 (×4): qty 0.72

## 2020-12-04 MED ORDER — DROPERIDOL 2.5 MG/ML IJ SOLN
1.2500 mg | Freq: Once | INTRAMUSCULAR | Status: AC
  Administered 2020-12-04: 1.25 mg via INTRAVENOUS
  Filled 2020-12-04: qty 2

## 2020-12-04 MED ORDER — IBUPROFEN 400 MG PO TABS
800.0000 mg | ORAL_TABLET | Freq: Three times a day (TID) | ORAL | Status: DC | PRN
  Administered 2020-12-05 – 2020-12-06 (×4): 800 mg via ORAL
  Filled 2020-12-04 (×4): qty 2

## 2020-12-04 MED ORDER — CLONAZEPAM 0.5 MG PO TABS
0.5000 mg | ORAL_TABLET | Freq: Three times a day (TID) | ORAL | Status: DC
  Administered 2020-12-05 (×3): 0.5 mg via ORAL
  Filled 2020-12-04 (×3): qty 1

## 2020-12-04 MED ORDER — LEVOTHYROXINE SODIUM 100 MCG PO TABS
300.0000 ug | ORAL_TABLET | Freq: Every day | ORAL | Status: DC
  Administered 2020-12-05: 300 ug via ORAL
  Filled 2020-12-04: qty 3

## 2020-12-04 MED ORDER — DIPHENHYDRAMINE HCL 50 MG/ML IJ SOLN
12.5000 mg | Freq: Once | INTRAMUSCULAR | Status: AC
  Administered 2020-12-04: 12.5 mg via INTRAVENOUS
  Filled 2020-12-04: qty 1

## 2020-12-04 MED ORDER — ZOLPIDEM TARTRATE 5 MG PO TABS: 5.0000 mg | ORAL_TABLET | Freq: Every evening | ORAL | Status: DC | PRN

## 2020-12-04 MED ORDER — INSULIN ASPART 100 UNIT/ML IJ SOLN: 0.0000 [IU] | Freq: Every day | INTRAMUSCULAR | Status: DC

## 2020-12-04 MED ORDER — INSULIN ASPART 100 UNIT/ML IJ SOLN: 0.0000 [IU] | Freq: Three times a day (TID) | INTRAMUSCULAR | Status: DC

## 2020-12-04 MED ORDER — QUETIAPINE FUMARATE 300 MG PO TABS
600.0000 mg | ORAL_TABLET | Freq: Every day | ORAL | Status: DC
  Administered 2020-12-05: 600 mg via ORAL
  Filled 2020-12-04 (×3): qty 2

## 2020-12-04 MED ORDER — ONDANSETRON HCL 4 MG/2ML IJ SOLN
4.0000 mg | Freq: Four times a day (QID) | INTRAMUSCULAR | Status: DC | PRN
  Administered 2020-12-05: 4 mg via INTRAVENOUS
  Filled 2020-12-04: qty 2

## 2020-12-04 MED ORDER — METOPROLOL SUCCINATE ER 50 MG PO TB24
75.0000 mg | ORAL_TABLET | Freq: Every day | ORAL | Status: DC
  Administered 2020-12-05: 75 mg via ORAL
  Filled 2020-12-04 (×2): qty 1

## 2020-12-04 MED ORDER — IOHEXOL 350 MG/ML SOLN
125.0000 mL | Freq: Once | INTRAVENOUS | Status: AC | PRN
  Administered 2020-12-04: 125 mL via INTRAVENOUS

## 2020-12-04 MED ORDER — PROCHLORPERAZINE EDISYLATE 10 MG/2ML IJ SOLN
10.0000 mg | Freq: Once | INTRAMUSCULAR | Status: AC
  Administered 2020-12-04: 10 mg via INTRAVENOUS
  Filled 2020-12-04: qty 2

## 2020-12-04 MED ORDER — ONDANSETRON HCL 4 MG PO TABS: 4.0000 mg | ORAL_TABLET | Freq: Four times a day (QID) | ORAL | Status: DC | PRN

## 2020-12-04 NOTE — ED Notes (Signed)
Rn to bedside to introduce self to pt. Pt hooked up to pulse ox only and attached to portable O2. In no acute distrress

## 2020-12-04 NOTE — ED Notes (Signed)
Pt OTF with CT 

## 2020-12-04 NOTE — ED Notes (Signed)
Rhonda Abrahams Ribay-pagdatoon, RN accepted pt at this time. Pt taken upstair by NT at this time.

## 2020-12-04 NOTE — ED Notes (Signed)
This RN responds to call light, Pt requesting to use bathroom. Able to ambulate to bathroom in room & void sans complications.

## 2020-12-04 NOTE — ED Triage Notes (Signed)
Pt states that she has had a headache, N/V and weakness for the last three days. She has taken Zofran and that has helped with the N/V. She reports that her head is hurting in the back of her head and it is not her normal headache and it is the worst headache she has ever had.

## 2020-12-04 NOTE — ED Triage Notes (Signed)
First RN Note: pt to ED via [facility] with c/o weakness, N/V x 3 days, per ems pt also c/o HA 10/10 x 3 days.   Per EMS pt 86% on RA, 98% on 2L 99.1 ETCO 64 196/100

## 2020-12-04 NOTE — ED Provider Notes (Signed)
[facility] Emergency Department Provider Note  ____________________________________________   Event Date/Time   First MD Initiated Contact with Patient 12/04/20 1501     (approximate)  I have reviewed the triage vital signs and the nursing notes.   HISTORY  Chief Complaint Weakness, Nausea, Emesis, and Headache    HPI Rhonda Palmer is a 50 y.o. female with heart failure, diabetes, bipolar, had normal cath in 2022, hypertension who comes in with concerns for weakness nausea, emesis and headaches.  It is reported that patient was 86% on room air patient was placed on 2 L of oxygen.  Patient states that she has been feeling unwell for 3 days.  She is had gradually worsening headache over this time.  She reports yesterday because of the pain she developed some nausea and vomiting x1.  Since then she just been feeling very weak, constant, nothing makes it better or worse.  She reports maybe a little bit of shortness of breath but states that she is not been taking her torsemide for over a week.  Maybe a little bit of swelling her legs as well.  No abdominal pain, urinary symptoms.  Denies any tick bites, thunderclap headache but does report that she is never had headaches in the past and this is definitely different for her          Past Medical History:  Diagnosis Date   (HFpEF) heart failure with preserved ejection fraction (HCC)    a. 02/2020 Echo: EF 55-60%, no rwma. Nl RV fxn.   Anxiety    Asthma    Bipolar 1 disorder (HCC)    Diabetes mellitus without complication (HCC)    Edema leg for twenty years per patient   taking lasix for edema   Fibromyalgia    History of cardiac catheterization    a. 02/2020 Cath: Nl cors. LVEDP -> torsemide increased to 40mg  daily.   Hypertension    Hypothyroidism    Morbid obesity (HCC)    Ovarian cyst    Palpitations    a. 03/2020 Zio: RSR, avg HR 84 (55-203), occas SVT runs. No significant arrhythmias.  Most  triggered events associate with sinus rhythm.   Schizo affective schizophrenia [facility])     Patient Active Problem List   Diagnosis Date Noted   Unstable angina (HCC)    Chest pain 03/11/2020   Normocytic anemia 03/11/2020   Bipolar 1 disorder (HCC)    Depression with anxiety    Hypertension    HLD (hyperlipidemia)    Diabetes mellitus without complication (HCC)    Morbid obesity with BMI of 60.0-69.9, adult (HCC)    Acute respiratory failure with hypoxia (HCC)    Dyspnea on exertion    Fall 03/27/2017   Long term (current) use of opiate analgesic 06/26/2016   Long term prescription opiate use 06/26/2016   Opiate use 06/26/2016   Obesity 01/17/2016   History of opioid abuse (HCC) 01/17/2016   Asthma 01/17/2016   Mixed hyperlipidemia 10/09/2015   Hypokalemia 10/09/2015   Headache above the eye region 06/16/2013   Edema of extremities 05/18/2013   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 states long time ago   LEFT HEART CATH AND CORONARY ANGIOGRAPHY N/A 03/13/2020   Procedure: LEFT HEART CATH AND CORONARY ANGIOGRAPHY;  Surgeon: Yvonne Kendall, MD;  Location: [facility] INVASIVE CV LAB;  Service: Cardiovascular;  Laterality: N/A;    Prior to Admission medications   Medication Sig Start Date End Date Taking? Authorizing Provider  albuterol (PROVENTIL HFA;VENTOLIN HFA) 108 (90 Base) MCG/ACT inhaler Inhale 2 puffs into the lungs every 6 (six) hours as needed for wheezing or shortness of breath.     [provider]  ARIPiprazole ER (ABILIFY MAINTENA) 300 MG SRER injection Inject 300 mg into the muscle every 28 (twenty-eight) days.    [provider]  ARISTADA INITIO 675 MG/2.4ML prefilled syringe Inject 2.4 ml intramuscularly single dose 02/27/20   [provider]  clonazePAM (KLONOPIN) 0.5 MG tablet Take 0.5 mg by mouth 3 (three) times daily.  02/18/18   [provider]  levothyroxine (SYNTHROID) 200 MCG tablet Take by mouth daily. 06/26/20   [provider]  levothyroxine (SYNTHROID) 50 MCG tablet Take by mouth daily. 06/26/20   [provider]  Levothyroxine Sodium 200 MCG CAPS Take 200 mcg by mouth daily. 02/02/20   [provider]  metoprolol succinate (TOPROL XL) 50 MG 24 hr tablet Take 1.5 tablets (75 mg total) by mouth daily. 06/29/20 09/27/20  Creig Hines, NP  ondansetron (ZOFRAN) 4 MG tablet Take 4 mg by mouth every 8 (eight) hours as needed. 02/27/20   [provider]  QUEtiapine (SEROQUEL) 300 MG tablet Take 600 mg by mouth at bedtime. 02/16/20   [provider]  torsemide 40 MG TABS Take 40 mg by mouth daily. 03/18/20   Danford, Earl Lites, MD  zolpidem (AMBIEN) 5 MG tablet Take 5 mg by mouth at bedtime as needed. 03/05/20   [provider]    Allergies Codeine, Sulfa antibiotics, and Penicillins  Family History  Problem Relation Age of Onset   Heart failure Mother    Bladder Cancer Neg Hx    Kidney cancer Neg Hx     Social History Social History   Tobacco Use   Smoking status: Never   Smokeless tobacco: Never  Vaping Use   Vaping Use: Never used  Substance Use Topics   Alcohol use: No   Drug use: No      Review of Systems Constitutional: Reports subjective fever yesterday, generalized weakness Eyes: No visual changes. ENT: No sore throat. Cardiovascular: Denies chest pain. Respiratory: Denies shortness of breath. Gastrointestinal: No abdominal pain.  Positive  nausea, vomiting.  No diarrhea.  No constipation. Genitourinary: Negative for dysuria. Musculoskeletal: Negative for back pain. Skin: Negative for rash. Neurological: Positive headache, no focal weakness or numbness. All other ROS negative ____________________________________________   PHYSICAL EXAM:  VITAL SIGNS: ED Triage Vitals  Enc Vitals Group     BP 12/04/20 1425 (!) 148/110     Pulse Rate 12/04/20 1425 91     Resp 12/04/20 1425 20     Temp 12/04/20 1425 97.8 F (36.6 C)     Temp Source 12/04/20 1425 Oral     SpO2 12/04/20 1425 90 %     Weight 12/04/20 1426 (!) 323 lb (146.5 kg)     Height 12/04/20 1426 5' (1.524 m)     Head Circumference --      Peak Flow --  Pain Score 12/04/20 1426 10     Pain Loc --      Pain Edu? --      Excl. in GC? --     Constitutional: Alert and oriented.  Elevated BMI Eyes: Conjunctivae are normal. EOMI. Head: Atraumatic. Nose: No congestion/rhinnorhea. Mouth/Throat: Mucous membranes are moist.   Neck: No stridor. Trachea Midline. FROM Cardiovascular: Normal rate, regular rhythm. Grossly normal heart sounds.  Good peripheral circulation. Respiratory: Normal respiratory effort.  No retractions. Lungs CTAB. Gastrointestinal: Soft and nontender. No distention. No abdominal bruits.  Musculoskeletal: No lower extremity tenderness nor edema.  No joint effusions. Neurologic:  Normal speech and language. No gross focal neurologic deficits are appreciated.  Equal strength in arms and legs.  Cranial nerves appear intact Skin:  Skin is warm, dry and intact. No rash noted. Psychiatric: Mood and affect are normal. Speech and behavior are normal. GU: Deferred   ____________________________________________   LABS (all labs ordered are listed, but only abnormal results are displayed)  Labs Reviewed  BASIC METABOLIC PANEL - Abnormal; Notable for the following components:      Result Value   Chloride 97 (*)    Glucose, Bld 114 (*)    All  other components within normal limits  CBC - Abnormal; Notable for the following components:   MCHC 29.9 (*)    nRBC 2.1 (*)    All other components within normal limits  URINALYSIS, ROUTINE W REFLEX MICROSCOPIC  CBG MONITORING, ED  POC URINE PREG, ED   ____________________________________________   ED ECG REPORT I, Concha Se, the attending physician, personally viewed and interpreted this ECG.  Normal sinus rate of 90, no ST elevation, T wave version in V2 and aVL, normal intervals ____________________________________________  RADIOLOGY Vela Prose, personally viewed and evaluated these images (plain radiographs) as part of my medical decision making, as well as reviewing the written report by the radiologist.  ED MD interpretation: No pneumonia on chest x-ray  Official radiology report(s): DG Chest 2 View  Result Date: 12/04/2020 CLINICAL DATA:  Shortness of breath. Additional history provided: Headache, nausea/vomiting, weakness. EXAM: CHEST - 2 VIEW COMPARISON:  Prior chest radiographs 03/11/2020 and earlier. CT angiogram chest 03/03/2020. FINDINGS: Unchanged cardiomediastinal silhouette with normal heart size. Redemonstrated postoperative changes to the right chest. No appreciable airspace consolidation or pulmonary edema. No evidence of pleural effusion or pneumothorax. No acute bony abnormality identified. Surgical clips within the upper abdomen. IMPRESSION: No evidence of acute cardiopulmonary abnormality. Electronically Signed   By: Jackey Loge D.O.   On: 12/04/2020 15:39   CT HEAD WO CONTRAST  Result Date: 12/04/2020 CLINICAL DATA:  Headache, migraine EXAM: CT HEAD WITHOUT CONTRAST TECHNIQUE: Contiguous axial images were obtained from the base of the skull through the vertex without intravenous contrast. COMPARISON:  05/19/2018 FINDINGS: Brain: No evidence of acute infarction, hemorrhage, hydrocephalus, extra-axial collection or mass lesion/mass effect. Vascular: No  hyperdense vessel or unexpected calcification. Skull: Normal. Negative for fracture or focal lesion. Sinuses/Orbits: No acute finding. Other: None. IMPRESSION: No acute intracranial findings. Electronically Signed   By: Duanne Guess D.O.   On: 12/04/2020 14:45    ____________________________________________   PROCEDURES  Procedure(s) performed (including Critical Care):  .Critical Care Performed by: Concha Se, MD Authorized by: Concha Se, MD   Critical care provider statement:    Critical care time (minutes):  35   Critical care was necessary to treat or prevent imminent or life-threatening deterioration of the following conditions:  Respiratory failure   Critical care was time spent personally by me on the following activities:  Blood draw for specimens, development of treatment plan with patient or surrogate, evaluation of patient's response to treatment, examination of patient, ordering and performing treatments and interventions, ordering and review of laboratory studies, ordering and review of radiographic studies, pulse oximetry, re-evaluation of patient's condition and review of old charts   Care discussed with: admitting provider     ____________________________________________   INITIAL IMPRESSION / ASSESSMENT AND PLAN / ED COURSE  Galilee Pierron was evaluated in Emergency Department on 12/04/2020 for the symptoms described in the history of present illness. She was evaluated in the context of the global COVID-19 pandemic, which necessitated consideration that the patient might be at risk for infection with the SARS-CoV-2 virus that causes COVID-19. Institutional protocols and algorithms that pertain to the evaluation of patients at risk for COVID-19 are in a state of rapid change based on information released by regulatory bodies including the CDC and federal and state organizations. These policies and algorithms were followed during the patient's care in the ED.     Labs ordered to look for any electrolyte abnormalities, AKI, chest x-ray evaluate for pneumonia, COVID, flu swab and CT head evaluate for intercranial hemorrhage.  Labs are reassuring.  Chloride slightly low otherwise no evidence of anemia or significant white count elevation  CT head was negative for intracranial hemorrhage  Patient was taken off the oxygen to see what her sats were and she did dip down to 80% with a good waveform.  Patient was placed back on oxygen.  Her chest x-ray was negative and COVID test was negative therefore we will proceed with CT PE to rule out pulmonary embolism as well as look for any pneumonia or any other cause of hypoxia.  Patient still complained of a headache.  Added on a CTA just to make sure no evidence of an aneurysm given the headache was severe and due to habitus LP would be very difficult.  CT PE and CTA were negative  Maybe hypoxia from asthma? But I did not here any wheezing will trial duoneb.  Maybe obesity hypoventilation. Regardless will need to admit due to hypoxia.        ____________________________________________   FINAL CLINICAL IMPRESSION(S) / ED DIAGNOSES   Final diagnoses:  Acute respiratory failure with hypoxia (HCC)  New onset of headaches      MEDICATIONS GIVEN DURING THIS VISIT:  Medications  enoxaparin (LOVENOX) injection 72.5 mg (72.5 mg Subcutaneous Given 12/04/20 2308)  ondansetron (ZOFRAN) tablet 4 mg (has no administration in time range)    Or  ondansetron (ZOFRAN) injection 4 mg (has no administration in time range)  hydrALAZINE (APRESOLINE) injection 10 mg (has no administration in time range)  ipratropium-albuterol (DUONEB) 0.5-2.5 (3) MG/3ML nebulizer solution 3 mL (has no administration in time range)  insulin aspart (novoLOG) injection 0-20 Units (has no administration in time range)  insulin aspart (novoLOG) injection 0-5 Units (0 Units Subcutaneous Not Given 12/04/20 2218)  metoprolol succinate  (TOPROL-XL) 24 hr tablet 75 mg (has no administration in time range)  clonazePAM (KLONOPIN) tablet 0.5 mg (has no administration in time range)  ARIPiprazole ER (ABILIFY MAINTENA) injection 300 mg (has no administration in time range)  albuterol (PROVENTIL) (2.5 MG/3ML) 0.083% nebulizer solution 2.5 mg (has no administration in time range)  QUEtiapine (SEROQUEL) tablet 600 mg (has no administration in time range)  zolpidem (AMBIEN) tablet 5 mg (has no administration in  time range)  ARIPiprazole Lauroxil ER (ARISTADA) 1064 MG/3.9ML prefilled syringe 1,064 mg (has no administration in time range)  levothyroxine (SYNTHROID) tablet 300 mcg (has no administration in time range)  ibuprofen (ADVIL) tablet 800 mg (has no administration in time range)  prochlorperazine (COMPAZINE) injection 10 mg (10 mg Intravenous Given 12/04/20 1702)  diphenhydrAMINE (BENADRYL) injection 12.5 mg (12.5 mg Intravenous Given 12/04/20 1703)  acetaminophen (TYLENOL) tablet 1,000 mg (1,000 mg Oral Given 12/04/20 1939)  droperidol (INAPSINE) 2.5 MG/ML injection 1.25 mg (1.25 mg Intravenous Given 12/04/20 1939)  iohexol (OMNIPAQUE) 350 MG/ML injection 125 mL (125 mLs Intravenous Contrast Given 12/04/20 1909)  ipratropium-albuterol (DUONEB) 0.5-2.5 (3) MG/3ML nebulizer solution 3 mL (3 mLs Nebulization Given 12/04/20 2050)  ipratropium-albuterol (DUONEB) 0.5-2.5 (3) MG/3ML nebulizer solution 3 mL (3 mLs Nebulization Given 12/04/20 2049)     ED Discharge Orders     None        Note:  This document was prepared using Dragon voice recognition software and may include unintentional dictation errors.    Concha Se, MD 12/04/20 [phone]

## 2020-12-04 NOTE — H&P (Signed)
History and Physical   Rhonda Palmer IWL:[id number] DOB: 1970/12/25 DOA: 12/04/2020  Referring MD/NP/PA: Dr. Fuller Plan  PCP: Franciso Bend, NP   Patient coming from: Home  Chief Complaint: Shortness of breath, headache  HPI: Rhonda Palmer is a 50 y.o. female with medical history significant of morbid obesity with BMI of 63, heart failure with preserved ejection fraction, bipolar disorder with schizoaffective features, anxiety disorder, history of asthma, diabetes, lower extremity edema, hypothyroidism and essential hypertension who presents from home with significant hypoxia, headaches nausea and vomiting.  EMS reported oxygen sats in the 80s..  Patient seen in the ER with oxygen sat 89%.  So far all work-up has been negative including CT angiogram of the chest.  Head CT angiogram also negative.  Headache is persistent.  She has chronic pain syndrome with fibromyalgia.  Her hypoxia is therefore unaccounted for with no evidence of pneumonia or any lung pathology.  Patient most likely has obesity hypoventilation syndrome.  She is not wheezing and no evidence of asthmatic exacerbation.  She is being admitted for observation and hopefully may be able to get home O2..  ED Course: Temperature 97.8, blood pressure 156/101, pulse 91 respirate 22 and oxygen sats 80% on room air and 99.9% on 2 L.  CBC entirely within normal chemistry showed chloride 97 otherwise rest of the numbers within normal glucose 114.  TSH is 19 with free T4 0.83.  COVID-19 and influenza negative.  Urinalysis essentially negative.  Chest x-ray showed no acute findings.  CT angiogram of the chest showed no PE.  No other pathology.  Head CT without contrast also negative.  At this point patient is being admitted with hypoxia for further work-up.  Review of Systems: As per HPI otherwise 10 point review of systems negative.    Past Medical History:  Diagnosis Date   (HFpEF) heart failure with preserved ejection fraction (HCC)     a. 02/2020 Echo: EF 55-60%, no rwma. Nl RV fxn.   Anxiety    Asthma    Bipolar 1 disorder (HCC)    Diabetes mellitus without complication (HCC)    Edema leg for twenty years per patient   taking lasix for edema   Fibromyalgia    History of cardiac catheterization    a. 02/2020 Cath: Nl cors. LVEDP -> torsemide increased to 40mg  daily.   Hypertension    Hypothyroidism    Morbid obesity (HCC)    Ovarian cyst    Palpitations    a. 03/2020 Zio: RSR, avg HR 84 (55-203), occas SVT runs. No significant arrhythmias.  Most triggered events associate with sinus rhythm.   Schizo affective schizophrenia [facility])     Past Surgical History:  Procedure Laterality Date   ABDOMINAL HYSTERECTOMY     ANKLE SURGERY     pt. states long time ago   LEFT HEART CATH AND CORONARY ANGIOGRAPHY N/A 03/13/2020   Procedure: LEFT HEART CATH AND CORONARY ANGIOGRAPHY;  Surgeon: Yvonne Kendall, MD;  Location: [facility] INVASIVE CV LAB;  Service: Cardiovascular;  Laterality: N/A;     reports that she has never smoked. She has never used smokeless tobacco. She reports that she does not drink alcohol and does not use drugs.  Allergies  Allergen Reactions   Codeine Hives, Itching, Rash and Swelling   Sulfa Antibiotics Anaphylaxis   Penicillins Itching    Family History  Problem Relation Age of Onset   Heart failure Mother    Bladder Cancer Neg Hx  Kidney cancer Neg Hx      Prior to Admission medications   Medication Sig Start Date End Date Taking? Authorizing Provider  albuterol (PROVENTIL HFA;VENTOLIN HFA) 108 (90 Base) MCG/ACT inhaler Inhale 2 puffs into the lungs every 6 (six) hours as needed for wheezing or shortness of breath.    [provider]  ARIPiprazole ER (ABILIFY MAINTENA) 300 MG SRER injection Inject 300 mg into the muscle every 28 (twenty-eight) days.    [provider]  ARISTADA INITIO 675 MG/2.4ML prefilled syringe Inject 2.4 ml intramuscularly single dose 02/27/20    [provider]  clonazePAM (KLONOPIN) 0.5 MG tablet Take 0.5 mg by mouth 3 (three) times daily.  02/18/18   [provider]  levothyroxine (SYNTHROID) 200 MCG tablet Take by mouth daily. 06/26/20   [provider]  levothyroxine (SYNTHROID) 50 MCG tablet Take by mouth daily. 06/26/20   [provider]  Levothyroxine Sodium 200 MCG CAPS Take 200 mcg by mouth daily. 02/02/20   [provider]  metoprolol succinate (TOPROL XL) 50 MG 24 hr tablet Take 1.5 tablets (75 mg total) by mouth daily. 06/29/20 09/27/20  Creig Hines, NP  ondansetron (ZOFRAN) 4 MG tablet Take 4 mg by mouth every 8 (eight) hours as needed. 02/27/20   [provider]  QUEtiapine (SEROQUEL) 300 MG tablet Take 600 mg by mouth at bedtime. 02/16/20   [provider]  torsemide 40 MG TABS Take 40 mg by mouth daily. 03/18/20   Danford, Earl Lites, MD  zolpidem (AMBIEN) 5 MG tablet Take 5 mg by mouth at bedtime as needed. 03/05/20   [provider]    Physical Exam: Vitals:   12/04/20 1700 12/04/20 1730 12/04/20 1830 12/04/20 2000  BP: (!) 149/86 (!) 149/98 (!) 156/88 (!) 156/101  Pulse: 79 76 72 81  Resp: 20 20 (!) 22 16  Temp:      TempSrc:      SpO2: 96% 98% 95% 99%  Weight:      Height:          Constitutional: Morbidly obese, no distress Vitals:   12/04/20 1700 12/04/20 1730 12/04/20 1830 12/04/20 2000  BP: (!) 149/86 (!) 149/98 (!) 156/88 (!) 156/101  Pulse: 79 76 72 81  Resp: 20 20 (!) 22 16  Temp:      TempSrc:      SpO2: 96% 98% 95% 99%  Weight:      Height:       Eyes: PERRL, lids and conjunctivae normal ENMT: Mucous membranes are dry. Posterior pharynx clear of any exudate or lesions.Normal dentition.  Neck: normal, supple, no masses, no thyromegaly Respiratory: Coarse breath sounds bilaterally, no wheezing, no crackles. Normal respiratory effort. No accessory muscle use.  Cardiovascular: Regular rate and rhythm, no murmurs /  rubs / gallops.  1+ extremity edema. 2+ pedal pulses. No carotid bruits.  Abdomen: no tenderness, no masses palpated. No hepatosplenomegaly. Bowel sounds positive.  Musculoskeletal: no clubbing / cyanosis. No joint deformity upper and lower extremities. Good ROM, no contractures. Normal muscle tone.  Skin: no rashes, lesions, ulcers. No induration Neurologic: CN 2-12 grossly intact. Sensation intact, DTR normal. Strength 5/5 in all 4.  Psychiatric: Normal judgment and insight. Alert and oriented x 3. Normal mood.     Labs on Admission: I have personally reviewed following labs and imaging studies  CBC: Recent Labs  Lab 12/04/20 1435  WBC 9.5  HGB 13.3  HCT 44.5  MCV 94.9  PLT 274  Basic Metabolic Panel: Recent Labs  Lab 12/04/20 1435  NA 137  K 3.7  CL 97*  CO2 32  GLUCOSE 114*  BUN 10  CREATININE 0.63  CALCIUM 9.0   GFR: Estimated Creatinine Clearance: 111.6 mL/min (by C-G formula based on SCr of 0.63 mg/dL). Liver Function Tests: Recent Labs  Lab 12/04/20 1435  AST 18  ALT 22  ALKPHOS 91  BILITOT 0.6  PROT 7.6  ALBUMIN 3.9   Recent Labs  Lab 12/04/20 1435  LIPASE 22   No results for input(s): AMMONIA in the last 168 hours. Coagulation Profile: No results for input(s): INR, PROTIME in the last 168 hours. Cardiac Enzymes: No results for input(s): CKTOTAL, CKMB, CKMBINDEX, TROPONINI in the last 168 hours. BNP (last 3 results) No results for input(s): PROBNP in the last 8760 hours. HbA1C: No results for input(s): HGBA1C in the last 72 hours. CBG: No results for input(s): GLUCAP in the last 168 hours. Lipid Profile: No results for input(s): CHOL, HDL, LDLCALC, TRIG, CHOLHDL, LDLDIRECT in the last 72 hours. Thyroid Function Tests: Recent Labs    12/04/20 1435  TSH 19.908*  FREET4 0.83   Anemia Panel: No results for input(s): VITAMINB12, FOLATE, FERRITIN, TIBC, IRON, RETICCTPCT in the last 72 hours. Urine analysis:    Component Value Date/Time    COLORURINE YELLOW (A) 12/04/2020 1524   APPEARANCEUR HAZY (A) 12/04/2020 1524   APPEARANCEUR Clear 04/29/2017 0918   LABSPEC 1.029 12/04/2020 1524   PHURINE 6.0 12/04/2020 1524   GLUCOSEU >=500 (A) 12/04/2020 1524   HGBUR NEGATIVE 12/04/2020 1524   BILIRUBINUR NEGATIVE 12/04/2020 1524   BILIRUBINUR Negative 04/29/2017 0918   KETONESUR 20 (A) 12/04/2020 1524   PROTEINUR 30 (A) 12/04/2020 1524   NITRITE NEGATIVE 12/04/2020 1524   LEUKOCYTESUR NEGATIVE 12/04/2020 1524   Sepsis Labs: (procalcitonin:4,lacticidven:4) ) Recent Results (from the past 240 hour(s))  Resp Panel by RT-PCR (Flu A&B, Covid) Nasopharyngeal Swab     Status: None   Collection Time: 12/04/20  3:24 PM   Specimen: Nasopharyngeal Swab; Nasopharyngeal(NP) swabs in vial transport medium  Result Value Ref Range Status   SARS Coronavirus 2 by RT PCR NEGATIVE NEGATIVE Final    Comment: (NOTE) SARS-CoV-2 target nucleic acids are NOT DETECTED.  The SARS-CoV-2 RNA is generally detectable in upper respiratory specimens during the acute phase of infection. The lowest concentration of SARS-CoV-2 viral copies this assay can detect is 138 copies/mL. A negative result does not preclude SARS-Cov-2 infection and should not be used as the sole basis for treatment or other patient management decisions. A negative result may occur with  improper specimen collection/handling, submission of specimen other than nasopharyngeal swab, presence of viral mutation(s) within the areas targeted by this assay, and inadequate number of viral copies(<138 copies/mL). A negative result must be combined with clinical observations, patient history, and epidemiological information. The expected result is Negative.  Fact Sheet for Patients:  BloggerCourse.com  Fact Sheet for Healthcare Providers:  SeriousBroker.it  This test is no t yet approved or cleared by the [place] FDA and   has been authorized for detection and/or diagnosis of SARS-CoV-2 by FDA under an Emergency Use Authorization (EUA). This EUA will remain  in effect (meaning this test can be used) for the duration of the COVID-19 declaration under Section 564(b)(1) of the Act, 21 U.S.C.section 360bbb-3(b)(1), unless the authorization is terminated  or revoked sooner.       Influenza A by PCR NEGATIVE NEGATIVE Final   Influenza B by  PCR NEGATIVE NEGATIVE Final    Comment: (NOTE) The Xpert Xpress SARS-CoV-2/FLU/RSV plus assay is intended as an aid in the diagnosis of influenza from Nasopharyngeal swab specimens and should not be used as a sole basis for treatment. Nasal washings and aspirates are unacceptable for Xpert Xpress SARS-CoV-2/FLU/RSV testing.  Fact Sheet for Patients: BloggerCourse.com  Fact Sheet for Healthcare Providers: SeriousBroker.it  This test is not yet approved or cleared by the [place] FDA and has been authorized for detection and/or diagnosis of SARS-CoV-2 by FDA under an Emergency Use Authorization (EUA). This EUA will remain in effect (meaning this test can be used) for the duration of the COVID-19 declaration under Section 564(b)(1) of the Act, 21 U.S.C. section 360bbb-3(b)(1), unless the authorization is terminated or revoked.  Performed at [facility], [address] [place], [place] [address]      Radiological Exams on Admission: CT ANGIO HEAD NECK W WO CM  Result Date: 12/04/2020 CLINICAL DATA:  Neuro deficit, stroke suspected EXAM: CT ANGIOGRAPHY HEAD AND NECK TECHNIQUE: Multidetector CT imaging of the head and neck was performed using the standard protocol during bolus administration of intravenous contrast. Multiplanar CT image reconstructions and MIPs were obtained to evaluate the vascular anatomy. Carotid stenosis measurements (when applicable) are obtained utilizing NASCET criteria, using the  distal internal carotid diameter as the denominator. CONTRAST:  OMNIPAQUE IOHEXOL 350 MG/ML SOLN COMPARISON:  CT head 12/04/2020. FINDINGS: CT HEAD FINDINGS Please see same day CT head without contrast report. CTA NECK FINDINGS Aortic arch: Common origin of the brachiocephalic and left common carotid arteries. Origin of the left vertebral artery from the aortic arch. Imaged portion shows no evidence of aneurysm or dissection. No significant stenosis of the major arch vessel origins. Right carotid system: No evidence of dissection, stenosis (50% or greater) or occlusion. Left carotid system: No evidence of dissection, stenosis (50% or greater) or occlusion. Vertebral arteries: Mildly right dominant. No evidence of dissection, stenosis (50% or greater) or occlusion. Skeleton: No acute osseous abnormality. Other neck: Negative. Upper chest: Please see same-day CT chest Review of the MIP images confirms the above findings CTA HEAD FINDINGS Evaluation is somewhat limited by venous contamination, particularly about the M1 segments. Anterior circulation: Both internal carotid arteries are patent to the termini, without stenosis or other abnormality. A1 segments patent. Normal anterior communicating artery. Anterior cerebral arteries are patent to their distal aspects. No M1 stenosis or occlusion. Normal MCA bifurcations. Distal MCA branches perfused and symmetric. Posterior circulation: Vertebral arteries widely patent to the vertebrobasilar junction without stenosis. Posterior inferior cerebral arteries patent bilaterally. Basilar patent to its distal aspect. Superior cerebral arteries patent bilaterally. PCAs well perfused to their distal aspects without stenosis. Near fetal origin of the left PCA. The bilateral posterior communicating arteries are visualized. Venous sinuses: As permitted by contrast timing, patent. Anatomic variants: None significant Review of the MIP images confirms the above findings IMPRESSION:  1. No hemodynamically significant stenosis in the neck. 2. No intracranial large vessel occlusion or significant stenosis. Electronically Signed   By: Wiliam Ke M.D.   On: 12/04/2020 19:41   DG Chest 2 View  Result Date: 12/04/2020 CLINICAL DATA:  Shortness of breath. Additional history provided: Headache, nausea/vomiting, weakness. EXAM: CHEST - 2 VIEW COMPARISON:  Prior chest radiographs 03/11/2020 and earlier. CT angiogram chest 03/03/2020. FINDINGS: Unchanged cardiomediastinal silhouette with normal heart size. Redemonstrated postoperative changes to the right chest. No appreciable airspace consolidation or pulmonary edema. No evidence of pleural effusion or  pneumothorax. No acute bony abnormality identified. Surgical clips within the upper abdomen. IMPRESSION: No evidence of acute cardiopulmonary abnormality. Electronically Signed   By: Jackey Loge D.O.   On: 12/04/2020 15:39   CT HEAD WO CONTRAST  Result Date: 12/04/2020 CLINICAL DATA:  Headache, migraine EXAM: CT HEAD WITHOUT CONTRAST TECHNIQUE: Contiguous axial images were obtained from the base of the skull through the vertex without intravenous contrast. COMPARISON:  05/19/2018 FINDINGS: Brain: No evidence of acute infarction, hemorrhage, hydrocephalus, extra-axial collection or mass lesion/mass effect. Vascular: No hyperdense vessel or unexpected calcification. Skull: Normal. Negative for fracture or focal lesion. Sinuses/Orbits: No acute finding. Other: None. IMPRESSION: No acute intracranial findings. Electronically Signed   By: Duanne Guess D.O.   On: 12/04/2020 14:45   CT Angio Chest PE W and/or Wo Contrast  Result Date: 12/04/2020 CLINICAL DATA:  Concern for pulmonary embolism. EXAM: CT ANGIOGRAPHY CHEST WITH CONTRAST TECHNIQUE: Multidetector CT imaging of the chest was performed using the standard protocol during bolus administration of intravenous contrast. Multiplanar CT image reconstructions and MIPs were obtained to evaluate  the vascular anatomy. CONTRAST:  OMNIPAQUE IOHEXOL 350 MG/ML SOLN COMPARISON:  Chest CT dated 03/03/2020. Chest radiograph 12/04/2020. FINDINGS: Cardiovascular: Top-normal cardiac size. No pericardial effusion. The thoracic aorta is unremarkable. Evaluation of the pulmonary arteries is limited due to respiratory motion artifact as well as streak artifact caused by patient's arms. No large central pulmonary artery embolus identified. Mediastinum/Nodes: No hilar or mediastinal adenopathy. The esophagus is grossly unremarkable. No mediastinal fluid collection. Lungs/Pleura: Minimal bibasilar dependent atelectasis. No focal consolidation, pleural effusion, pneumothorax. The central airways are patent. Upper Abdomen: Fatty liver. Musculoskeletal: Degenerative changes of the spine. No acute osseous pathology. Review of the MIP images confirms the above findings. IMPRESSION: 1. No acute intrathoracic pathology. No CT evidence of central pulmonary artery embolus. 2. Fatty liver. Electronically Signed   By: Elgie Collard M.D.   On: 12/04/2020 19:55    EKG: Independently reviewed.  Normal sinus rhythm otherwise no significant findings.  Assessment/Plan Principal Problem:   Acute on chronic respiratory failure with hypoxemia (HCC) Active Problems:   Personality disorder (HCC)   Hypothyroidism   Headache above the eye region   GERD (gastroesophageal reflux disease)   Asthma   Long term (current) use of opiate analgesic   Bipolar 1 disorder (HCC)   HLD (hyperlipidemia)   Diabetes mellitus without complication (HCC)   Morbid obesity with BMI of 60.0-69.9, adult (HCC)     #1 acute respiratory failure with hypoxemia: More than likely secondary to obesity hypoventilation syndrome.  So far all initial work-up is negative.  Patient will be admitted and oxygen titrated.  She has history of asthma and has some faint expiratory wheezing.  Will treat with breathing treatments no steroids.  Respiratory to  evaluate.  Patient already qualifies for home O2 based on today's oxygen saturation.  Will likely be set up with home O2 and be discharged in the next 1 to 2 days.  #2 diabetes: Initiate sliding scale insulin and monitor.  #3 morbid obesity: Dietary counseling.  #4 persistent headache: Patient on cocktails for headaches at home.  We will confirm on continue.  #5 hyperlipidemia: Confirm on continue home regimen.  #6 GERD: Continue with PPIs  #7 bipolar disorder: Confirm on resume home regimen.  #8 hypothyroidism: Thyroid panel borderline normal.  Continue levothyroxine   DVT prophylaxis: Lovenox Code Status: Full code Family Communication: No family at bedside Disposition Plan: Home Consults called: None Admission status:  Observation  Severity of Illness: The appropriate patient status for this patient is OBSERVATION. Observation status is judged to be reasonable and necessary in order to provide the required intensity of service to ensure the patient's safety. The patient's presenting symptoms, physical exam findings, and initial radiographic and laboratory data in the context of their medical condition is felt to place them at decreased risk for further clinical deterioration. Furthermore, it is anticipated that the patient will be medically stable for discharge from the hospital within 2 midnights of admission.    Lonia Blood MD [facility] Pager [phone]  If 7PM-7AM, please contact night-coverage [email or web address] Password [facility]  12/04/2020, 8:46 PM

## 2020-12-05 ENCOUNTER — Encounter: Payer: Self-pay | Admitting: Obstetrics and Gynecology

## 2020-12-05 DIAGNOSIS — Z6841 Body Mass Index (BMI) 40.0 and over, adult: Secondary | ICD-10-CM | POA: Diagnosis not present

## 2020-12-05 DIAGNOSIS — M797 Fibromyalgia: Secondary | ICD-10-CM | POA: Diagnosis present

## 2020-12-05 DIAGNOSIS — J9601 Acute respiratory failure with hypoxia: Secondary | ICD-10-CM | POA: Diagnosis present

## 2020-12-05 DIAGNOSIS — E119 Type 2 diabetes mellitus without complications: Secondary | ICD-10-CM | POA: Diagnosis present

## 2020-12-05 DIAGNOSIS — F259 Schizoaffective disorder, unspecified: Secondary | ICD-10-CM | POA: Diagnosis present

## 2020-12-05 DIAGNOSIS — J9621 Acute and chronic respiratory failure with hypoxia: Secondary | ICD-10-CM | POA: Diagnosis not present

## 2020-12-05 DIAGNOSIS — I110 Hypertensive heart disease with heart failure: Secondary | ICD-10-CM | POA: Diagnosis present

## 2020-12-05 DIAGNOSIS — Z79899 Other long term (current) drug therapy: Secondary | ICD-10-CM | POA: Diagnosis not present

## 2020-12-05 DIAGNOSIS — K219 Gastro-esophageal reflux disease without esophagitis: Secondary | ICD-10-CM | POA: Diagnosis present

## 2020-12-05 DIAGNOSIS — Z79890 Hormone replacement therapy (postmenopausal): Secondary | ICD-10-CM | POA: Diagnosis not present

## 2020-12-05 DIAGNOSIS — Z87892 Personal history of anaphylaxis: Secondary | ICD-10-CM | POA: Diagnosis not present

## 2020-12-05 DIAGNOSIS — Z23 Encounter for immunization: Secondary | ICD-10-CM | POA: Diagnosis not present

## 2020-12-05 DIAGNOSIS — Z79891 Long term (current) use of opiate analgesic: Secondary | ICD-10-CM | POA: Diagnosis not present

## 2020-12-05 DIAGNOSIS — R0902 Hypoxemia: Secondary | ICD-10-CM | POA: Diagnosis present

## 2020-12-05 DIAGNOSIS — J45909 Unspecified asthma, uncomplicated: Secondary | ICD-10-CM | POA: Diagnosis present

## 2020-12-05 DIAGNOSIS — E662 Morbid (severe) obesity with alveolar hypoventilation: Secondary | ICD-10-CM | POA: Diagnosis present

## 2020-12-05 DIAGNOSIS — F319 Bipolar disorder, unspecified: Secondary | ICD-10-CM | POA: Diagnosis present

## 2020-12-05 DIAGNOSIS — E785 Hyperlipidemia, unspecified: Secondary | ICD-10-CM | POA: Diagnosis present

## 2020-12-05 DIAGNOSIS — I471 Supraventricular tachycardia: Secondary | ICD-10-CM | POA: Diagnosis present

## 2020-12-05 DIAGNOSIS — E039 Hypothyroidism, unspecified: Secondary | ICD-10-CM | POA: Diagnosis present

## 2020-12-05 DIAGNOSIS — G2401 Drug induced subacute dyskinesia: Secondary | ICD-10-CM | POA: Diagnosis present

## 2020-12-05 DIAGNOSIS — F419 Anxiety disorder, unspecified: Secondary | ICD-10-CM | POA: Diagnosis present

## 2020-12-05 DIAGNOSIS — Z20822 Contact with and (suspected) exposure to covid-19: Secondary | ICD-10-CM | POA: Diagnosis present

## 2020-12-05 DIAGNOSIS — J9611 Chronic respiratory failure with hypoxia: Secondary | ICD-10-CM | POA: Diagnosis present

## 2020-12-05 DIAGNOSIS — I5032 Chronic diastolic (congestive) heart failure: Secondary | ICD-10-CM | POA: Diagnosis present

## 2020-12-05 DIAGNOSIS — T43505A Adverse effect of unspecified antipsychotics and neuroleptics, initial encounter: Secondary | ICD-10-CM | POA: Diagnosis present

## 2020-12-05 DIAGNOSIS — R519 Headache, unspecified: Secondary | ICD-10-CM | POA: Diagnosis present

## 2020-12-05 LAB — COMPREHENSIVE METABOLIC PANEL
ALT: 21 U/L (ref 0–44)
AST: 17 U/L (ref 15–41)
Albumin: 3.7 g/dL (ref 3.5–5.0)
Alkaline Phosphatase: 92 U/L (ref 38–126)
Anion gap: 6 (ref 5–15)
BUN: 9 mg/dL (ref 6–20)
CO2: 34 mmol/L — ABNORMAL HIGH (ref 22–32)
Calcium: 8.7 mg/dL — ABNORMAL LOW (ref 8.9–10.3)
Chloride: 99 mmol/L (ref 98–111)
Creatinine, Ser: 0.5 mg/dL (ref 0.44–1.00)
GFR, Estimated: >60 mL/min (ref >60)
Glucose, Bld: 121 mg/dL — ABNORMAL HIGH (ref 70–99)
Potassium: 3.8 mmol/L (ref 3.5–5.1)
Sodium: 139 mmol/L (ref 135–145)
Total Bilirubin: 1 mg/dL (ref 0.3–1.2)
Total Protein: 7.5 g/dL (ref 6.5–8.1)

## 2020-12-05 LAB — CBC
HCT: 44.4 % (ref 36.0–46.0)
Hemoglobin: 13 g/dL (ref 12.0–15.0)
MCH: 27.8 pg (ref 26.0–34.0)
MCHC: 29.3 g/dL — ABNORMAL LOW (ref 30.0–36.0)
MCV: 95.1 fL (ref 80.0–100.0)
Platelets: 290 10*3/uL (ref 150–400)
RBC: 4.67 MIL/uL (ref 3.87–5.11)
RDW: 15.6 % — ABNORMAL HIGH (ref 11.5–15.5)
WBC: 9 10*3/uL (ref 4.0–10.5)
nRBC: 1.6 % — ABNORMAL HIGH (ref 0.0–0.2)

## 2020-12-05 LAB — HEMOGLOBIN A1C
Hgb A1c MFr Bld: 6.2 % — ABNORMAL HIGH (ref 4.8–5.6)
Mean Plasma Glucose: 131.24 mg/dL

## 2020-12-05 LAB — HIV ANTIBODY (ROUTINE TESTING W REFLEX): HIV Screen 4th Generation wRfx: NONREACTIVE

## 2020-12-05 LAB — GLUCOSE, CAPILLARY
Glucose-Capillary: 118 mg/dL — ABNORMAL HIGH (ref 70–99)
Glucose-Capillary: 113 mg/dL — ABNORMAL HIGH (ref 70–99)

## 2020-12-05 MED ORDER — COVID-19MRNA BIVAL VAC MODERNA 50 MCG/0.5ML IM SUSP
0.5000 mL | Freq: Once | INTRAMUSCULAR | Status: DC
  Filled 2020-12-05 (×2): qty 0.5

## 2020-12-05 MED ORDER — INFLUENZA VAC SPLIT QUAD 0.5 ML IM SUSY
0.5000 mL | PREFILLED_SYRINGE | INTRAMUSCULAR | Status: AC
  Administered 2020-12-06: 0.5 mL via INTRAMUSCULAR
  Filled 2020-12-05: qty 0.5

## 2020-12-05 MED ORDER — LEVOTHYROXINE SODIUM 100 MCG PO TABS
350.0000 ug | ORAL_TABLET | Freq: Every day | ORAL | Status: DC
  Administered 2020-12-06: 350 ug via ORAL
  Filled 2020-12-05: qty 3

## 2020-12-05 NOTE — Progress Notes (Signed)
PROGRESS NOTE    Rhonda Palmer  ZDG:[id number] DOB: 05-12-1970 DOA: 12/04/2020 PCP: Franciso Bend, NP  Outpatient Specialists: psychiatry    Brief Narrative:   From admission h and p Rhonda Palmer is a 50 y.o. female with medical history significant of morbid obesity with BMI of 63, heart failure with preserved ejection fraction, bipolar disorder with schizoaffective features, anxiety disorder, history of asthma, diabetes, lower extremity edema, hypothyroidism and essential hypertension who presents from home with significant hypoxia, headaches nausea and vomiting.  EMS reported oxygen sats in the 80s..  Patient seen in the ER with oxygen sat 89%.  So far all work-up has been negative including CT angiogram of the chest.  Head CT angiogram also negative.  Headache is persistent.  She has chronic pain syndrome with fibromyalgia.  Her hypoxia is therefore unaccounted for with no evidence of pneumonia or any lung pathology.  Patient most likely has obesity hypoventilation syndrome.  She is not wheezing and no evidence of asthmatic exacerbation.  She is being admitted for observation and hopefully may be able to get home O2..     Assessment & Plan:   Principal Problem:   Acute on chronic respiratory failure with hypoxemia (HCC) Active Problems:   Personality disorder (HCC)   Hypothyroidism   Headache above the eye region   GERD (gastroesophageal reflux disease)   Asthma   Long term (current) use of opiate analgesic   Bipolar 1 disorder (HCC)   HLD (hyperlipidemia)   Diabetes mellitus without complication (HCC)   Morbid obesity with BMI of 60.0-69.9, adult (HCC)  # Hypoxic respiratory failure # Obesity hypoventilation syndrome O2 80s via EMS, here requiring 2 L to maintain in 90s. No dyspnea. CXR clear, CTA negative. Bnp wnl no signs chf. With elevated bicarb and morbid obesity suspect obesity hypoventilation syndrome. Patient says had sleep study last year and reports it was  normal. No wheeze to suggest asthma exacerbation - wean o2 as able, may need to d/c on home O2  # T2DM Diet controlled, here fastings wnl - will monitor fastings, start SSI as needed  # Morbid obesity - bariatric surgery scheduled for later this month  # Headache CT head neg - pain control  # Bipolar disorder # Tardive dyskinesia Patient's main complaint is intermittent involuntary movement of extremities likely tardive dyskinesia from chronic antipsychotics - advised discussion w/ psychiatrist - cont home meds  # Hx psvt - cont metoprolol  # hypothyroid Tsh markedly elevated - increase levothyroxine from 300 to 350 qd - outpt f/u 4-6 wks   DVT prophylaxis: lovenox Code Status: full Family Communication: none @ bedside  Level of care: Telemetry Medical Status is: Observation  The patient will require care spanning > 2 midnights and should be moved to inpatient because: severity of illness       Consultants:  none  Procedures: none  Antimicrobials:  none    Subjective: This morning no sob, no chest pain, no vomiting or diarrhea, headache improved  Objective: Vitals:   12/04/20 2206 12/04/20 2212 12/05/20 0442 12/05/20 0734  BP:  (!) 149/97 (!) 142/82 (!) 153/90  Pulse:  90 78 82  Resp:  16 16 14   Temp:  97.8 F (36.6 C) (!) 97.5 F (36.4 C) 97.8 F (36.6 C)  TempSrc:  Oral Oral Oral  SpO2:  93% 94% 94%  Weight: (!) 147.1 kg     Height: 5\' 2"  (1.575 m)       Intake/Output Summary (Last  24 hours) at 12/05/2020 0938 Last data filed at 12/05/2020 0532 Gross per 24 hour  Intake 0 ml  Output 0 ml  Net 0 ml   Filed Weights   12/04/20 1426 12/04/20 2206  Weight: (!) 146.5 kg (!) 147.1 kg    Examination:  General exam: Appears calm and comfortable  Respiratory system: Clear to auscultation. Respiratory effort normal. Cardiovascular system: S1 & S2 heard, RRR. No JVD, murmurs, rubs, gallops or clicks. no pedal edema. Gastrointestinal system:  Abdomen is nondistended, soft and nontender. No organomegaly or masses felt. Normal bowel sounds heard. Central nervous system: Alert and oriented. No focal neurological deficits. Extremities: Symmetric 5 x 5 power. Skin: No rashes, lesions or ulcers Psychiatry: Judgement and insight appear normal. Mood & affect appropriate.     Data Reviewed: I have personally reviewed following labs and imaging studies  CBC: Recent Labs  Lab 12/04/20 1435 12/05/20 0539  WBC 9.5 9.0  HGB 13.3 13.0  HCT 44.5 44.4  MCV 94.9 95.1  PLT 274 290   Basic Metabolic Panel: Recent Labs  Lab 12/04/20 1435 12/05/20 0539  NA 137 139  K 3.7 3.8  CL 97* 99  CO2 32 34*  GLUCOSE 114* 121*  BUN 10 9  CREATININE 0.63 0.50  CALCIUM 9.0 8.7*   GFR: Estimated Creatinine Clearance: 115.4 mL/min (by C-G formula based on SCr of 0.5 mg/dL). Liver Function Tests: Recent Labs  Lab 12/04/20 1435 12/05/20 0539  AST 18 17  ALT 22 21  ALKPHOS 91 92  BILITOT 0.6 1.0  PROT 7.6 7.5  ALBUMIN 3.9 3.7   Recent Labs  Lab 12/04/20 1435  LIPASE 22   No results for input(s): AMMONIA in the last 168 hours. Coagulation Profile: No results for input(s): INR, PROTIME in the last 168 hours. Cardiac Enzymes: No results for input(s): CKTOTAL, CKMB, CKMBINDEX, TROPONINI in the last 168 hours. BNP (last 3 results) No results for input(s): PROBNP in the last 8760 hours. HbA1C: No results for input(s): HGBA1C in the last 72 hours. CBG: Recent Labs  Lab 12/04/20 2217 12/05/20 0731  GLUCAP 118* 113*   Lipid Profile: No results for input(s): CHOL, HDL, LDLCALC, TRIG, CHOLHDL, LDLDIRECT in the last 72 hours. Thyroid Function Tests: Recent Labs    12/04/20 1435  TSH 19.908*  FREET4 0.83   Anemia Panel: No results for input(s): VITAMINB12, FOLATE, FERRITIN, TIBC, IRON, RETICCTPCT in the last 72 hours. Urine analysis:    Component Value Date/Time   COLORURINE YELLOW (A) 12/04/2020 1524   APPEARANCEUR HAZY  (A) 12/04/2020 1524   APPEARANCEUR Clear 04/29/2017 0918   LABSPEC 1.029 12/04/2020 1524   PHURINE 6.0 12/04/2020 1524   GLUCOSEU >=500 (A) 12/04/2020 1524   HGBUR NEGATIVE 12/04/2020 1524   BILIRUBINUR NEGATIVE 12/04/2020 1524   BILIRUBINUR Negative 04/29/2017 0918   KETONESUR 20 (A) 12/04/2020 1524   PROTEINUR 30 (A) 12/04/2020 1524   NITRITE NEGATIVE 12/04/2020 1524   LEUKOCYTESUR NEGATIVE 12/04/2020 1524   Sepsis Labs: (procalcitonin:4,lacticidven:4)  ) Recent Results (from the past 240 hour(s))  Resp Panel by RT-PCR (Flu A&B, Covid) Nasopharyngeal Swab     Status: None   Collection Time: 12/04/20  3:24 PM   Specimen: Nasopharyngeal Swab; Nasopharyngeal(NP) swabs in vial transport medium  Result Value Ref Range Status   SARS Coronavirus 2 by RT PCR NEGATIVE NEGATIVE Final    Comment: (NOTE) SARS-CoV-2 target nucleic acids are NOT DETECTED.  The SARS-CoV-2 RNA is generally detectable in upper respiratory specimens during the  acute phase of infection. The lowest concentration of SARS-CoV-2 viral copies this assay can detect is 138 copies/mL. A negative result does not preclude SARS-Cov-2 infection and should not be used as the sole basis for treatment or other patient management decisions. A negative result may occur with  improper specimen collection/handling, submission of specimen other than nasopharyngeal swab, presence of viral mutation(s) within the areas targeted by this assay, and inadequate number of viral copies(<138 copies/mL). A negative result must be combined with clinical observations, patient history, and epidemiological information. The expected result is Negative.  Fact Sheet for Patients:  BloggerCourse.com  Fact Sheet for Healthcare Providers:  SeriousBroker.it  This test is no t yet approved or cleared by the [place] FDA and  has been authorized for detection and/or diagnosis of  SARS-CoV-2 by FDA under an Emergency Use Authorization (EUA). This EUA will remain  in effect (meaning this test can be used) for the duration of the COVID-19 declaration under Section 564(b)(1) of the Act, 21 U.S.C.section 360bbb-3(b)(1), unless the authorization is terminated  or revoked sooner.       Influenza A by PCR NEGATIVE NEGATIVE Final   Influenza B by PCR NEGATIVE NEGATIVE Final    Comment: (NOTE) The Xpert Xpress SARS-CoV-2/FLU/RSV plus assay is intended as an aid in the diagnosis of influenza from Nasopharyngeal swab specimens and should not be used as a sole basis for treatment. Nasal washings and aspirates are unacceptable for Xpert Xpress SARS-CoV-2/FLU/RSV testing.  Fact Sheet for Patients: BloggerCourse.com  Fact Sheet for Healthcare Providers: SeriousBroker.it  This test is not yet approved or cleared by the [place] FDA and has been authorized for detection and/or diagnosis of SARS-CoV-2 by FDA under an Emergency Use Authorization (EUA). This EUA will remain in effect (meaning this test can be used) for the duration of the COVID-19 declaration under Section 564(b)(1) of the Act, 21 U.S.C. section 360bbb-3(b)(1), unless the authorization is terminated or revoked.  Performed at [facility], [address] [place], [place] [address]          Radiology Studies: CT ANGIO HEAD NECK W WO CM  Result Date: 12/04/2020 CLINICAL DATA:  Neuro deficit, stroke suspected EXAM: CT ANGIOGRAPHY HEAD AND NECK TECHNIQUE: Multidetector CT imaging of the head and neck was performed using the standard protocol during bolus administration of intravenous contrast. Multiplanar CT image reconstructions and MIPs were obtained to evaluate the vascular anatomy. Carotid stenosis measurements (when applicable) are obtained utilizing NASCET criteria, using the distal internal carotid diameter as the denominator. CONTRAST:   OMNIPAQUE IOHEXOL 350 MG/ML SOLN COMPARISON:  CT head 12/04/2020. FINDINGS: CT HEAD FINDINGS Please see same day CT head without contrast report. CTA NECK FINDINGS Aortic arch: Common origin of the brachiocephalic and left common carotid arteries. Origin of the left vertebral artery from the aortic arch. Imaged portion shows no evidence of aneurysm or dissection. No significant stenosis of the major arch vessel origins. Right carotid system: No evidence of dissection, stenosis (50% or greater) or occlusion. Left carotid system: No evidence of dissection, stenosis (50% or greater) or occlusion. Vertebral arteries: Mildly right dominant. No evidence of dissection, stenosis (50% or greater) or occlusion. Skeleton: No acute osseous abnormality. Other neck: Negative. Upper chest: Please see same-day CT chest Review of the MIP images confirms the above findings CTA HEAD FINDINGS Evaluation is somewhat limited by venous contamination, particularly about the M1 segments. Anterior circulation: Both internal carotid arteries are patent to the termini, without stenosis or other  abnormality. A1 segments patent. Normal anterior communicating artery. Anterior cerebral arteries are patent to their distal aspects. No M1 stenosis or occlusion. Normal MCA bifurcations. Distal MCA branches perfused and symmetric. Posterior circulation: Vertebral arteries widely patent to the vertebrobasilar junction without stenosis. Posterior inferior cerebral arteries patent bilaterally. Basilar patent to its distal aspect. Superior cerebral arteries patent bilaterally. PCAs well perfused to their distal aspects without stenosis. Near fetal origin of the left PCA. The bilateral posterior communicating arteries are visualized. Venous sinuses: As permitted by contrast timing, patent. Anatomic variants: None significant Review of the MIP images confirms the above findings IMPRESSION: 1. No hemodynamically significant stenosis in the neck. 2. No  intracranial large vessel occlusion or significant stenosis. Electronically Signed   By: Wiliam Ke M.D.   On: 12/04/2020 19:41   DG Chest 2 View  Result Date: 12/04/2020 CLINICAL DATA:  Shortness of breath. Additional history provided: Headache, nausea/vomiting, weakness. EXAM: CHEST - 2 VIEW COMPARISON:  Prior chest radiographs 03/11/2020 and earlier. CT angiogram chest 03/03/2020. FINDINGS: Unchanged cardiomediastinal silhouette with normal heart size. Redemonstrated postoperative changes to the right chest. No appreciable airspace consolidation or pulmonary edema. No evidence of pleural effusion or pneumothorax. No acute bony abnormality identified. Surgical clips within the upper abdomen. IMPRESSION: No evidence of acute cardiopulmonary abnormality. Electronically Signed   By: Jackey Loge D.O.   On: 12/04/2020 15:39   CT HEAD WO CONTRAST  Result Date: 12/04/2020 CLINICAL DATA:  Headache, migraine EXAM: CT HEAD WITHOUT CONTRAST TECHNIQUE: Contiguous axial images were obtained from the base of the skull through the vertex without intravenous contrast. COMPARISON:  05/19/2018 FINDINGS: Brain: No evidence of acute infarction, hemorrhage, hydrocephalus, extra-axial collection or mass lesion/mass effect. Vascular: No hyperdense vessel or unexpected calcification. Skull: Normal. Negative for fracture or focal lesion. Sinuses/Orbits: No acute finding. Other: None. IMPRESSION: No acute intracranial findings. Electronically Signed   By: Duanne Guess D.O.   On: 12/04/2020 14:45   CT Angio Chest PE W and/or Wo Contrast  Result Date: 12/04/2020 CLINICAL DATA:  Concern for pulmonary embolism. EXAM: CT ANGIOGRAPHY CHEST WITH CONTRAST TECHNIQUE: Multidetector CT imaging of the chest was performed using the standard protocol during bolus administration of intravenous contrast. Multiplanar CT image reconstructions and MIPs were obtained to evaluate the vascular anatomy. CONTRAST:  OMNIPAQUE IOHEXOL 350  MG/ML SOLN COMPARISON:  Chest CT dated 03/03/2020. Chest radiograph 12/04/2020. FINDINGS: Cardiovascular: Top-normal cardiac size. No pericardial effusion. The thoracic aorta is unremarkable. Evaluation of the pulmonary arteries is limited due to respiratory motion artifact as well as streak artifact caused by patient's arms. No large central pulmonary artery embolus identified. Mediastinum/Nodes: No hilar or mediastinal adenopathy. The esophagus is grossly unremarkable. No mediastinal fluid collection. Lungs/Pleura: Minimal bibasilar dependent atelectasis. No focal consolidation, pleural effusion, pneumothorax. The central airways are patent. Upper Abdomen: Fatty liver. Musculoskeletal: Degenerative changes of the spine. No acute osseous pathology. Review of the MIP images confirms the above findings. IMPRESSION: 1. No acute intrathoracic pathology. No CT evidence of central pulmonary artery embolus. 2. Fatty liver. Electronically Signed   By: Elgie Collard M.D.   On: 12/04/2020 19:55        Scheduled Meds:  [START ON 12/26/2020] ARIPiprazole ER  300 mg Intramuscular Q28 days   clonazePAM  0.5 mg Oral TID   enoxaparin (LOVENOX) injection  0.5 mg/kg Subcutaneous Q24H   insulin aspart  0-20 Units Subcutaneous TID WC   insulin aspart  0-5 Units Subcutaneous QHS   levothyroxine  300 mcg  Oral Q0600   metoprolol succinate  75 mg Oral Daily   QUEtiapine  600 mg Oral QHS   Continuous Infusions:   LOS: 0 days    Time spent: 40 min    Silvano Bilis, MD [facility]   If 7PM-7AM, please contact night-coverage [email or web address] Password [facility] 12/05/2020, 9:38 AM

## 2020-12-05 NOTE — Progress Notes (Signed)
SATURATION QUALIFICATIONS:  Patient Saturations on Room Air at Rest = 86%  Patient Saturations on Room Air while Ambulating = 83%  Patient Saturations on 2 Liters of oxygen while Ambulating = 96%  Please briefly explain why patient needs home oxygen: Desaturation on room air.

## 2020-12-05 NOTE — Plan of Care (Signed)

## 2020-12-06 DIAGNOSIS — J9621 Acute and chronic respiratory failure with hypoxia: Secondary | ICD-10-CM | POA: Diagnosis not present

## 2020-12-06 LAB — GLUCOSE, CAPILLARY
Glucose-Capillary: 103 mg/dL — ABNORMAL HIGH (ref 70–99)
Glucose-Capillary: 130 mg/dL — ABNORMAL HIGH (ref 70–99)

## 2020-12-06 MED ORDER — CLONAZEPAM 0.5 MG PO TABS
0.5000 mg | ORAL_TABLET | Freq: Three times a day (TID) | ORAL | Status: DC
  Administered 2020-12-06: 0.5 mg via ORAL
  Filled 2020-12-06: qty 1

## 2020-12-06 MED ORDER — COVID-19MRNA BIVAL VACC PFIZER 30 MCG/0.3ML IM SUSP
0.3000 mL | Freq: Once | INTRAMUSCULAR | Status: AC
  Administered 2020-12-06: 0.3 mL via INTRAMUSCULAR
  Filled 2020-12-06: qty 0.3

## 2020-12-06 MED ORDER — LEVOTHYROXINE SODIUM 50 MCG PO TABS: 50.0000 ug | ORAL_TABLET | Freq: Every day | ORAL | 1 refills | Status: DC

## 2020-12-06 NOTE — Discharge Summary (Signed)
Rhonda Palmer:[id number] DOB: Oct 08, 1970 DOA: 12/04/2020  PCP: Franciso Bend, NP  Admit date: 12/04/2020 Discharge date: 12/06/2020  Time spent: 35 minutes  Recommendations for Outpatient Follow-up:  Pcp f/u, repeat TSH 4-6 wks    Discharge Diagnoses:  Principal Problem:   Acute on chronic respiratory failure with hypoxemia (HCC) Active Problems:   Personality disorder (HCC)   Hypothyroidism   Headache above the eye region   GERD (gastroesophageal reflux disease)   Asthma   Long term (current) use of opiate analgesic   Bipolar 1 disorder (HCC)   HLD (hyperlipidemia)   Diabetes mellitus without complication (HCC)   Morbid obesity with BMI of 60.0-69.9, adult (HCC)   Hypoxemia   Discharge Condition: stable  Diet recommendation: heart healthy  Filed Weights   12/04/20 1426 12/04/20 2206  Weight: (!) 146.5 kg (!) 147.1 kg    History of present illness:  Rhonda Palmer is a 50 y.o. female with medical history significant of morbid obesity with BMI of 63, heart failure with preserved ejection fraction, bipolar disorder with schizoaffective features, anxiety disorder, history of asthma, diabetes, lower extremity edema, hypothyroidism and essential hypertension who presents from home with significant hypoxia, headaches nausea and vomiting.  EMS reported oxygen sats in the 80s..  Patient seen in the ER with oxygen sat 89%.  So far all work-up has been negative including CT angiogram of the chest.  Head CT angiogram also negative.  Headache is persistent.  She has chronic pain syndrome with fibromyalgia.  Her hypoxia is therefore unaccounted for with no evidence of pneumonia or any lung pathology.  Patient most likely has obesity hypoventilation syndrome.  She is not wheezing and no evidence of asthmatic exacerbation.  She is being admitted for observation and hopefully may be able to get home O2.Marland Kitchen  Hospital Course:  # Hypoxic respiratory failure # Obesity hypoventilation  syndrome O2 80s on room air, here requiring 2 L to maintain in 90s. No dyspnea. CXR clear, CTA negative. Bnp wnl no signs chf. With elevated bicarb and morbid obesity suspect obesity hypoventilation syndrome. Patient says had sleep study last year and reports it was normal. No wheeze to suggest asthma exacerbation - home o2 - pcp f/u   # T2DM Diet controlled, here fastings wnl - will monitor fastings, start SSI as needed   # Morbid obesity - bariatric surgery scheduled for later this month, should help with OHS   # Headache CT head neg   # Bipolar disorder # Tardive dyskinesia Patient's main complaint is intermittent involuntary movement of extremities likely tardive dyskinesia from chronic antipsychotics - advised discussion w/ psychiatrist - cont home meds   # hypothyroid Tsh markedly elevated - increase levothyroxine from 300 to 350 qd - outpt f/u 4-6 wks  Procedures: none   Consultations: none  Discharge Exam: Vitals:   12/06/20 0645 12/06/20 0750  BP: 119/70 101/71  Pulse: 74 70  Resp: 20 16  Temp: 98 F (36.7 C) 97.9 F (36.6 C)  SpO2: 100% 100%    General exam: Appears calm and comfortable  Respiratory system: Clear to auscultation. Respiratory effort normal. Cardiovascular system: S1 & S2 heard, RRR. No JVD, murmurs, rubs, gallops or clicks. no pedal edema. Gastrointestinal system: Abdomen is nondistended, soft and nontender. No organomegaly or masses felt. Normal bowel sounds heard. Central nervous system: Alert and oriented. No focal neurological deficits. Extremities: Symmetric 5 x 5 power. Skin: No rashes, lesions or ulcers Psychiatry: Judgement and insight appear normal. Mood &  affect appropriate.   Discharge Instructions   Discharge Instructions     Diet - low sodium heart healthy   Complete by: As directed    Increase activity slowly   Complete by: As directed       Allergies as of 12/06/2020       Reactions   Codeine Hives, Itching,  Rash, Swelling   Sulfa Antibiotics Anaphylaxis   Penicillins Itching        Medication List     STOP taking these medications    ARIPiprazole ER 300 MG Srer injection Commonly known as: ABILIFY MAINTENA   metoprolol succinate 50 MG 24 hr tablet Commonly known as: Toprol XL   Torsemide 40 MG Tabs       TAKE these medications    albuterol 108 (90 Base) MCG/ACT inhaler Commonly known as: VENTOLIN HFA Inhale 2 puffs into the lungs every 6 (six) hours as needed for wheezing or shortness of breath.   Aristada Initio 675 MG/2.4ML prefilled syringe Generic drug: ARIPiprazole lauroxil ER Inject 2.4 ml intramuscularly single dose   Aristada 1064 MG/3.9ML prefilled syringe Generic drug: ARIPiprazole Lauroxil ER Inject 1,064 mg into the muscle every 8 (eight) weeks.   clonazePAM 0.5 MG tablet Commonly known as: KLONOPIN Take 0.5 mg by mouth 3 (three) times daily.   ibuprofen 800 MG tablet Commonly known as: ADVIL Take 800 mg by mouth every 8 (eight) hours as needed for moderate pain.   levothyroxine 300 MCG tablet Commonly known as: SYNTHROID Take 300 mcg by mouth every morning. What changed:  Another medication with the same name was changed. Make sure you understand how and when to take each. Another medication with the same name was removed. Continue taking this medication, and follow the directions you see here.   levothyroxine 50 MCG tablet Commonly known as: SYNTHROID Take 1 tablet (50 mcg total) by mouth daily. What changed:  how much to take Another medication with the same name was removed. Continue taking this medication, and follow the directions you see here.   ondansetron 4 MG tablet Commonly known as: ZOFRAN Take 4 mg by mouth every 8 (eight) hours as needed.   QUEtiapine 300 MG tablet Commonly known as: SEROQUEL Take 600 mg by mouth at bedtime.   zolpidem 5 MG tablet Commonly known as: AMBIEN Take 5 mg by mouth at bedtime as needed.                Durable Medical Equipment  (From admission, onward)           Start     Ordered   12/06/20 1115  DME Oxygen  Once       Question Answer Comment  Length of Need 6 Months   Mode or (Route) Nasal cannula   Liters per Minute 2   Frequency Continuous (stationary and portable oxygen unit needed)   Oxygen conserving device No   Oxygen delivery system Gas      12/06/20 1114           Allergies  Allergen Reactions   Codeine Hives, Itching, Rash and Swelling   Sulfa Antibiotics Anaphylaxis   Penicillins Itching    Follow-up Information     Franciso Bend, NP Follow up.   Specialty: Nurse Practitioner Contact information: [address] [place] [address] [phone]         Debbe Odea, MD .   Specialties: Cardiology, Radiology Contact information: [address] [place] [address] [phone]  The results of significant diagnostics from this hospitalization (including imaging, microbiology, ancillary and laboratory) are listed below for reference.    Significant Diagnostic Studies: CT ANGIO HEAD NECK W WO CM  Result Date: 12/04/2020 CLINICAL DATA:  Neuro deficit, stroke suspected EXAM: CT ANGIOGRAPHY HEAD AND NECK TECHNIQUE: Multidetector CT imaging of the head and neck was performed using the standard protocol during bolus administration of intravenous contrast. Multiplanar CT image reconstructions and MIPs were obtained to evaluate the vascular anatomy. Carotid stenosis measurements (when applicable) are obtained utilizing NASCET criteria, using the distal internal carotid diameter as the denominator. CONTRAST:  OMNIPAQUE IOHEXOL 350 MG/ML SOLN COMPARISON:  CT head 12/04/2020. FINDINGS: CT HEAD FINDINGS Please see same day CT head without contrast report. CTA NECK FINDINGS Aortic arch: Common origin of the brachiocephalic and left common carotid arteries. Origin of the left vertebral artery from the  aortic arch. Imaged portion shows no evidence of aneurysm or dissection. No significant stenosis of the major arch vessel origins. Right carotid system: No evidence of dissection, stenosis (50% or greater) or occlusion. Left carotid system: No evidence of dissection, stenosis (50% or greater) or occlusion. Vertebral arteries: Mildly right dominant. No evidence of dissection, stenosis (50% or greater) or occlusion. Skeleton: No acute osseous abnormality. Other neck: Negative. Upper chest: Please see same-day CT chest Review of the MIP images confirms the above findings CTA HEAD FINDINGS Evaluation is somewhat limited by venous contamination, particularly about the M1 segments. Anterior circulation: Both internal carotid arteries are patent to the termini, without stenosis or other abnormality. A1 segments patent. Normal anterior communicating artery. Anterior cerebral arteries are patent to their distal aspects. No M1 stenosis or occlusion. Normal MCA bifurcations. Distal MCA branches perfused and symmetric. Posterior circulation: Vertebral arteries widely patent to the vertebrobasilar junction without stenosis. Posterior inferior cerebral arteries patent bilaterally. Basilar patent to its distal aspect. Superior cerebral arteries patent bilaterally. PCAs well perfused to their distal aspects without stenosis. Near fetal origin of the left PCA. The bilateral posterior communicating arteries are visualized. Venous sinuses: As permitted by contrast timing, patent. Anatomic variants: None significant Review of the MIP images confirms the above findings IMPRESSION: 1. No hemodynamically significant stenosis in the neck. 2. No intracranial large vessel occlusion or significant stenosis. Electronically Signed   By: Wiliam Ke M.D.   On: 12/04/2020 19:41   DG Chest 2 View  Result Date: 12/04/2020 CLINICAL DATA:  Shortness of breath. Additional history provided: Headache, nausea/vomiting, weakness. EXAM: CHEST - 2  VIEW COMPARISON:  Prior chest radiographs 03/11/2020 and earlier. CT angiogram chest 03/03/2020. FINDINGS: Unchanged cardiomediastinal silhouette with normal heart size. Redemonstrated postoperative changes to the right chest. No appreciable airspace consolidation or pulmonary edema. No evidence of pleural effusion or pneumothorax. No acute bony abnormality identified. Surgical clips within the upper abdomen. IMPRESSION: No evidence of acute cardiopulmonary abnormality. Electronically Signed   By: Jackey Loge D.O.   On: 12/04/2020 15:39   CT HEAD WO CONTRAST  Result Date: 12/04/2020 CLINICAL DATA:  Headache, migraine EXAM: CT HEAD WITHOUT CONTRAST TECHNIQUE: Contiguous axial images were obtained from the base of the skull through the vertex without intravenous contrast. COMPARISON:  05/19/2018 FINDINGS: Brain: No evidence of acute infarction, hemorrhage, hydrocephalus, extra-axial collection or mass lesion/mass effect. Vascular: No hyperdense vessel or unexpected calcification. Skull: Normal. Negative for fracture or focal lesion. Sinuses/Orbits: No acute finding. Other: None. IMPRESSION: No acute intracranial findings. Electronically Signed   By: Duanne Guess D.O.   On: 12/04/2020 14:45  CT Angio Chest PE W and/or Wo Contrast  Result Date: 12/04/2020 CLINICAL DATA:  Concern for pulmonary embolism. EXAM: CT ANGIOGRAPHY CHEST WITH CONTRAST TECHNIQUE: Multidetector CT imaging of the chest was performed using the standard protocol during bolus administration of intravenous contrast. Multiplanar CT image reconstructions and MIPs were obtained to evaluate the vascular anatomy. CONTRAST:  OMNIPAQUE IOHEXOL 350 MG/ML SOLN COMPARISON:  Chest CT dated 03/03/2020. Chest radiograph 12/04/2020. FINDINGS: Cardiovascular: Top-normal cardiac size. No pericardial effusion. The thoracic aorta is unremarkable. Evaluation of the pulmonary arteries is limited due to respiratory motion artifact as well as streak  artifact caused by patient's arms. No large central pulmonary artery embolus identified. Mediastinum/Nodes: No hilar or mediastinal adenopathy. The esophagus is grossly unremarkable. No mediastinal fluid collection. Lungs/Pleura: Minimal bibasilar dependent atelectasis. No focal consolidation, pleural effusion, pneumothorax. The central airways are patent. Upper Abdomen: Fatty liver. Musculoskeletal: Degenerative changes of the spine. No acute osseous pathology. Review of the MIP images confirms the above findings. IMPRESSION: 1. No acute intrathoracic pathology. No CT evidence of central pulmonary artery embolus. 2. Fatty liver. Electronically Signed   By: Elgie Collard M.D.   On: 12/04/2020 19:55    Microbiology: Recent Results (from the past 240 hour(s))  Resp Panel by RT-PCR (Flu A&B, Covid) Nasopharyngeal Swab     Status: None   Collection Time: 12/04/20  3:24 PM   Specimen: Nasopharyngeal Swab; Nasopharyngeal(NP) swabs in vial transport medium  Result Value Ref Range Status   SARS Coronavirus 2 by RT PCR NEGATIVE NEGATIVE Final    Comment: (NOTE) SARS-CoV-2 target nucleic acids are NOT DETECTED.  The SARS-CoV-2 RNA is generally detectable in upper respiratory specimens during the acute phase of infection. The lowest concentration of SARS-CoV-2 viral copies this assay can detect is 138 copies/mL. A negative result does not preclude SARS-Cov-2 infection and should not be used as the sole basis for treatment or other patient management decisions. A negative result may occur with  improper specimen collection/handling, submission of specimen other than nasopharyngeal swab, presence of viral mutation(s) within the areas targeted by this assay, and inadequate number of viral copies(<138 copies/mL). A negative result must be combined with clinical observations, patient history, and epidemiological information. The expected result is Negative.  Fact Sheet for Patients:   BloggerCourse.com  Fact Sheet for Healthcare Providers:  SeriousBroker.it  This test is no t yet approved or cleared by the [place] FDA and  has been authorized for detection and/or diagnosis of SARS-CoV-2 by FDA under an Emergency Use Authorization (EUA). This EUA will remain  in effect (meaning this test can be used) for the duration of the COVID-19 declaration under Section 564(b)(1) of the Act, 21 U.S.C.section 360bbb-3(b)(1), unless the authorization is terminated  or revoked sooner.       Influenza A by PCR NEGATIVE NEGATIVE Final   Influenza B by PCR NEGATIVE NEGATIVE Final    Comment: (NOTE) The Xpert Xpress SARS-CoV-2/FLU/RSV plus assay is intended as an aid in the diagnosis of influenza from Nasopharyngeal swab specimens and should not be used as a sole basis for treatment. Nasal washings and aspirates are unacceptable for Xpert Xpress SARS-CoV-2/FLU/RSV testing.  Fact Sheet for Patients: BloggerCourse.com  Fact Sheet for Healthcare Providers: SeriousBroker.it  This test is not yet approved or cleared by the [place] FDA and has been authorized for detection and/or diagnosis of SARS-CoV-2 by FDA under an Emergency Use Authorization (EUA). This EUA will remain in effect (meaning this test can be used) for  the duration of the COVID-19 declaration under Section 564(b)(1) of the Act, 21 U.S.C. section 360bbb-3(b)(1), unless the authorization is terminated or revoked.  Performed at [facility], [address] [place], [place] [address]      Labs: Basic Metabolic Panel: Recent Labs  Lab 12/04/20 1435 12/05/20 0539  NA 137 139  K 3.7 3.8  CL 97* 99  CO2 32 34*  GLUCOSE 114* 121*  BUN 10 9  CREATININE 0.63 0.50  CALCIUM 9.0 8.7*   Liver Function Tests: Recent Labs  Lab 12/04/20 1435 12/05/20 0539  AST 18 17  ALT 22 21  ALKPHOS 91  92  BILITOT 0.6 1.0  PROT 7.6 7.5  ALBUMIN 3.9 3.7   Recent Labs  Lab 12/04/20 1435  LIPASE 22   No results for input(s): AMMONIA in the last 168 hours. CBC: Recent Labs  Lab 12/04/20 1435 12/05/20 0539  WBC 9.5 9.0  HGB 13.3 13.0  HCT 44.5 44.4  MCV 94.9 95.1  PLT 274 290   Cardiac Enzymes: No results for input(s): CKTOTAL, CKMB, CKMBINDEX, TROPONINI in the last 168 hours. BNP: BNP (last 3 results) Recent Labs    03/11/20 0803 12/04/20 1435  BNP 30.7 67.8    ProBNP (last 3 results) No results for input(s): PROBNP in the last 8760 hours.  CBG: Recent Labs  Lab 12/04/20 2217 12/05/20 0731 12/06/20 0456 12/06/20 0751  GLUCAP 118* 113* 103* 130*       Signed:  Silvano Bilis MD.  [facility] 12/06/2020, 11:17 AM

## 2020-12-06 NOTE — TOC CM/SW Note (Signed)
Patient has orders to discharge home today. Ordered home oxygen through [employer] [facility]. Patient is aware. PCP is Rexene Agent, NP. No wounds. No further concerns. CSW signing off.  Charlynn Court, CSW [phone]

## 2020-12-14 ENCOUNTER — Ambulatory Visit: Payer: Medicaid Other | Admitting: Nurse Practitioner

## 2020-12-14 NOTE — Progress Notes (Deleted)
Office Visit    Patient Name: Rhonda Palmer Date of Encounter: 12/14/2020  Primary Care Provider:  Franciso Bend, NP Primary Cardiologist:  Debbe Odea, MD  Chief Complaint    50 year old female with history of morbid obesity, hypertension, hypothyroid, hyperlipidemia, diabetes, asthma, bipolar disorder, palpitations, chest pain, and HFpEF, presents to clinic today for follow-up from recent hospitalization.   Past Medical History    Past Medical History:  Diagnosis Date   (HFpEF) heart failure with preserved ejection fraction (HCC)    a. 02/2020 Echo: EF 55-60%, no rwma. Nl RV fxn.   Anxiety    Asthma    Bipolar 1 disorder (HCC)    Diabetes mellitus without complication (HCC)    Edema leg for twenty years per patient   taking lasix for edema   Fibromyalgia    History of cardiac catheterization    a. 02/2020 Cath: Nl cors. LVEDP -> torsemide increased to 40mg  daily.   Hypertension    Hypothyroidism    Morbid obesity (HCC)    Ovarian cyst    Palpitations    a. 03/2020 Zio: RSR, avg HR 84 (55-203), occas SVT runs. No significant arrhythmias.  Most triggered events associate with sinus rhythm.   Schizo affective schizophrenia [facility])    Past Surgical History:  Procedure Laterality Date   ABDOMINAL HYSTERECTOMY     ANKLE SURGERY     pt. states long time ago   LEFT HEART CATH AND CORONARY ANGIOGRAPHY N/A 03/13/2020   Procedure: LEFT HEART CATH AND CORONARY ANGIOGRAPHY;  Surgeon: 03/15/2020, MD;  Location: [facility] INVASIVE CV LAB;  Service: Cardiovascular;  Laterality: N/A;    Allergies  Allergies  Allergen Reactions   Codeine Hives, Itching, Rash and Swelling   Sulfa Antibiotics Anaphylaxis   Penicillins Itching    History of Present Illness    50 year old female with history of morbid obesity, hypertension, hypothyroid, hyperlipidemia, diabetes, asthma, bipolar disorder, palpitations, chest pain, and HFpEF. She was initially evaluated in  February of 2022 in the setting of chest pain and palpitations. She was setup for Zio monitoring and a stress test however, she subsequently was evaluated in the emergency department for recurrent chest pain prompting admission. Echocardiogram showed normal LV function (EF 55-65%). Diagnostic catheretization showed normal coronary arteries with elevated LVEDP of 40 mmHg requiring increase in diuretic dose. She completed Zio monitoring which showed predominantly sinus rhythm with occasional, brief runs of SVT. Medical management was recommended. She continued to have palpations and was switched from propranolol to Toprol-XL. She is perusing gastric bypass surgery at [facility] scheduled at the end of November 2022. She was last seen in clinic 06/29/2020 for surgical clearance and was doing well. Unfortunately, on 12/04/2020 she was hospitalized with acute respiratory failure. She presented to the ED with hypoxia, she was found to have O2 sats in the 80s. CXR was clear, CTA neg, and BNP was WNL. Hypoxia was attributed to obesity hypoventilation syndrome and was discharged home with home O2.   Home Medications    Current Outpatient Medications  Medication Sig Dispense Refill   albuterol (PROVENTIL HFA;VENTOLIN HFA) 108 (90 Base) MCG/ACT inhaler Inhale 2 puffs into the lungs every 6 (six) hours as needed for wheezing or shortness of breath.     ARISTADA 1064 MG/3.9ML prefilled syringe Inject 1,064 mg into the muscle every 8 (eight) weeks.     ARISTADA INITIO 675 MG/2.4ML prefilled syringe Inject 2.4 ml intramuscularly single dose (Patient not taking: Reported on 12/04/2020)  clonazePAM (KLONOPIN) 0.5 MG tablet Take 0.5 mg by mouth 3 (three) times daily.      ibuprofen (ADVIL) 800 MG tablet Take 800 mg by mouth every 8 (eight) hours as needed for moderate pain.     levothyroxine (SYNTHROID) 300 MCG tablet Take 300 mcg by mouth every morning.     levothyroxine (SYNTHROID) 50 MCG tablet Take 1 tablet (50 mcg total) by  mouth daily. 30 tablet 1   ondansetron (ZOFRAN) 4 MG tablet Take 4 mg by mouth every 8 (eight) hours as needed.     QUEtiapine (SEROQUEL) 300 MG tablet Take 600 mg by mouth at bedtime.     zolpidem (AMBIEN) 5 MG tablet Take 5 mg by mouth at bedtime as needed.     No current facility-administered medications for this visit.     Review of Systems    ***.  All other systems reviewed and are otherwise negative except as noted above.    Physical Exam    VS:  There were no vitals taken for this visit. , BMI There is no height or weight on file to calculate BMI.     GEN: Well nourished, well developed, in no acute distress. HEENT: normal. Neck: Supple, no JVD, carotid bruits, or masses. Cardiac: RRR, no murmurs, rubs, or gallops. No clubbing, cyanosis, edema.  Radials/DP/PT 2+ and equal bilaterally.  Respiratory:  Respirations regular and unlabored, clear to auscultation bilaterally. GI: Soft, nontender, nondistended, BS + x 4. MS: no deformity or atrophy. Skin: warm and dry, no rash. Neuro:  Strength and sensation are intact. Psych: Normal affect.  Accessory Clinical Findings    ECG personally reviewed by me today - *** - no acute changes.  Lab Results  Component Value Date   WBC 9.0 12/05/2020   HGB 13.0 12/05/2020   HCT 44.4 12/05/2020   MCV 95.1 12/05/2020   PLT 290 12/05/2020   Lab Results  Component Value Date   CREATININE 0.50 12/05/2020   BUN 9 12/05/2020   NA 139 12/05/2020   K 3.8 12/05/2020   CL 99 12/05/2020   CO2 34 (H) 12/05/2020   Lab Results  Component Value Date   ALT 21 12/05/2020   AST 17 12/05/2020   ALKPHOS 92 12/05/2020   BILITOT 1.0 12/05/2020   Lab Results  Component Value Date   CHOL 255 (H) 03/12/2020   HDL 73 03/12/2020   LDLCALC 147 (H) 03/12/2020   TRIG 177 (H) 03/12/2020   CHOLHDL 3.5 03/12/2020    Lab Results  Component Value Date   HGBA1C 6.2 (H) 12/05/2020    Assessment & Plan    1.  ***   Nicolasa Ducking,  NP 12/14/2020, 7:15 AM

## 2021-01-11 ENCOUNTER — Inpatient Hospital Stay
Admission: EM | Admit: 2021-01-11 | Discharge: 2021-01-15 | DRG: 896 | Disposition: A | Discharge status: Home / Self Care | Payer: Medicaid Other | Attending: Internal Medicine | Admitting: Internal Medicine

## 2021-01-11 ENCOUNTER — Encounter: Payer: Self-pay | Admitting: Emergency Medicine

## 2021-01-11 ENCOUNTER — Other Ambulatory Visit: Payer: Self-pay

## 2021-01-11 ENCOUNTER — Emergency Department: Payer: Medicaid Other

## 2021-01-11 DIAGNOSIS — Z8249 Family history of ischemic heart disease and other diseases of the circulatory system: Secondary | ICD-10-CM | POA: Diagnosis not present

## 2021-01-11 DIAGNOSIS — R410 Disorientation, unspecified: Secondary | ICD-10-CM | POA: Diagnosis not present

## 2021-01-11 DIAGNOSIS — T402X5A Adverse effect of other opioids, initial encounter: Secondary | ICD-10-CM | POA: Diagnosis present

## 2021-01-11 DIAGNOSIS — I10 Essential (primary) hypertension: Secondary | ICD-10-CM | POA: Diagnosis not present

## 2021-01-11 DIAGNOSIS — F1193 Opioid use, unspecified with withdrawal: Secondary | ICD-10-CM | POA: Diagnosis present

## 2021-01-11 DIAGNOSIS — F319 Bipolar disorder, unspecified: Secondary | ICD-10-CM | POA: Diagnosis not present

## 2021-01-11 DIAGNOSIS — F418 Other specified anxiety disorders: Secondary | ICD-10-CM | POA: Diagnosis present

## 2021-01-11 DIAGNOSIS — J9690 Respiratory failure, unspecified, unspecified whether with hypoxia or hypercapnia: Secondary | ICD-10-CM | POA: Diagnosis present

## 2021-01-11 DIAGNOSIS — K909 Intestinal malabsorption, unspecified: Secondary | ICD-10-CM | POA: Diagnosis present

## 2021-01-11 DIAGNOSIS — F410 Panic disorder [episodic paroxysmal anxiety] without agoraphobia: Secondary | ICD-10-CM | POA: Diagnosis present

## 2021-01-11 DIAGNOSIS — R531 Weakness: Secondary | ICD-10-CM | POA: Diagnosis not present

## 2021-01-11 DIAGNOSIS — K654 Sclerosing mesenteritis: Secondary | ICD-10-CM | POA: Diagnosis present

## 2021-01-11 DIAGNOSIS — J4520 Mild intermittent asthma, uncomplicated: Secondary | ICD-10-CM

## 2021-01-11 DIAGNOSIS — M797 Fibromyalgia: Secondary | ICD-10-CM | POA: Diagnosis present

## 2021-01-11 DIAGNOSIS — N200 Calculus of kidney: Secondary | ICD-10-CM | POA: Diagnosis present

## 2021-01-11 DIAGNOSIS — F1123 Opioid dependence with withdrawal: Principal | ICD-10-CM | POA: Diagnosis present

## 2021-01-11 DIAGNOSIS — Z86718 Personal history of other venous thrombosis and embolism: Secondary | ICD-10-CM

## 2021-01-11 DIAGNOSIS — Z79899 Other long term (current) drug therapy: Secondary | ICD-10-CM

## 2021-01-11 DIAGNOSIS — K760 Fatty (change of) liver, not elsewhere classified: Secondary | ICD-10-CM | POA: Diagnosis present

## 2021-01-11 DIAGNOSIS — E119 Type 2 diabetes mellitus without complications: Secondary | ICD-10-CM | POA: Diagnosis present

## 2021-01-11 DIAGNOSIS — I5032 Chronic diastolic (congestive) heart failure: Secondary | ICD-10-CM | POA: Diagnosis present

## 2021-01-11 DIAGNOSIS — E785 Hyperlipidemia, unspecified: Secondary | ICD-10-CM | POA: Diagnosis present

## 2021-01-11 DIAGNOSIS — Z79890 Hormone replacement therapy (postmenopausal): Secondary | ICD-10-CM

## 2021-01-11 DIAGNOSIS — R1084 Generalized abdominal pain: Secondary | ICD-10-CM | POA: Diagnosis not present

## 2021-01-11 DIAGNOSIS — R197 Diarrhea, unspecified: Secondary | ICD-10-CM | POA: Diagnosis present

## 2021-01-11 DIAGNOSIS — E039 Hypothyroidism, unspecified: Secondary | ICD-10-CM | POA: Diagnosis present

## 2021-01-11 DIAGNOSIS — E876 Hypokalemia: Secondary | ICD-10-CM

## 2021-01-11 DIAGNOSIS — J45909 Unspecified asthma, uncomplicated: Secondary | ICD-10-CM | POA: Diagnosis present

## 2021-01-11 DIAGNOSIS — I110 Hypertensive heart disease with heart failure: Secondary | ICD-10-CM | POA: Diagnosis present

## 2021-01-11 DIAGNOSIS — R9431 Abnormal electrocardiogram [ECG] [EKG]: Secondary | ICD-10-CM | POA: Diagnosis present

## 2021-01-11 DIAGNOSIS — R1909 Other intra-abdominal and pelvic swelling, mass and lump: Secondary | ICD-10-CM | POA: Diagnosis not present

## 2021-01-11 DIAGNOSIS — Z880 Allergy status to penicillin: Secondary | ICD-10-CM

## 2021-01-11 DIAGNOSIS — F1511 Other stimulant abuse, in remission: Secondary | ICD-10-CM | POA: Diagnosis present

## 2021-01-11 DIAGNOSIS — Z20822 Contact with and (suspected) exposure to covid-19: Secondary | ICD-10-CM | POA: Diagnosis present

## 2021-01-11 DIAGNOSIS — Z9884 Bariatric surgery status: Secondary | ICD-10-CM

## 2021-01-11 DIAGNOSIS — R112 Nausea with vomiting, unspecified: Secondary | ICD-10-CM | POA: Diagnosis not present

## 2021-01-11 DIAGNOSIS — E66813 Obesity, class 3: Secondary | ICD-10-CM | POA: Diagnosis present

## 2021-01-11 DIAGNOSIS — Z882 Allergy status to sulfonamides status: Secondary | ICD-10-CM

## 2021-01-11 DIAGNOSIS — G9341 Metabolic encephalopathy: Secondary | ICD-10-CM | POA: Diagnosis present

## 2021-01-11 DIAGNOSIS — F250 Schizoaffective disorder, bipolar type: Secondary | ICD-10-CM | POA: Diagnosis present

## 2021-01-11 DIAGNOSIS — E860 Dehydration: Secondary | ICD-10-CM | POA: Diagnosis present

## 2021-01-11 DIAGNOSIS — R609 Edema, unspecified: Secondary | ICD-10-CM | POA: Diagnosis present

## 2021-01-11 DIAGNOSIS — R109 Unspecified abdominal pain: Secondary | ICD-10-CM | POA: Diagnosis present

## 2021-01-11 DIAGNOSIS — Z885 Allergy status to narcotic agent status: Secondary | ICD-10-CM

## 2021-01-11 DIAGNOSIS — Z6841 Body Mass Index (BMI) 40.0 and over, adult: Secondary | ICD-10-CM | POA: Diagnosis not present

## 2021-01-11 LAB — CBC WITH DIFFERENTIAL/PLATELET
Abs Immature Granulocytes: 0.05 10*3/uL (ref 0.00–0.07)
Basophils Absolute: 0.1 10*3/uL (ref 0.0–0.1)
Basophils Relative: 1 %
Eosinophils Absolute: 0.1 10*3/uL (ref 0.0–0.5)
Eosinophils Relative: 1 %
HCT: 45 % (ref 36.0–46.0)
Hemoglobin: 13.7 g/dL (ref 12.0–15.0)
Immature Granulocytes: 1 %
Lymphocytes Relative: 29 %
Lymphs Abs: 2.7 10*3/uL (ref 0.7–4.0)
MCH: 27.6 pg (ref 26.0–34.0)
MCHC: 30.4 g/dL (ref 30.0–36.0)
MCV: 90.5 fL (ref 80.0–100.0)
Monocytes Absolute: 0.8 10*3/uL (ref 0.1–1.0)
Monocytes Relative: 9 %
Neutro Abs: 5.7 10*3/uL (ref 1.7–7.7)
Neutrophils Relative %: 59 %
Platelets: 321 10*3/uL (ref 150–400)
RBC: 4.97 MIL/uL (ref 3.87–5.11)
RDW: 16.3 % — ABNORMAL HIGH (ref 11.5–15.5)
WBC: 9.4 10*3/uL (ref 4.0–10.5)
nRBC: 0.2 % (ref 0.0–0.2)

## 2021-01-11 LAB — COMPREHENSIVE METABOLIC PANEL
ALT: 32 U/L (ref 0–44)
AST: 30 U/L (ref 15–41)
Albumin: 3.4 g/dL — ABNORMAL LOW (ref 3.5–5.0)
Alkaline Phosphatase: 136 U/L — ABNORMAL HIGH (ref 38–126)
Anion gap: 9 (ref 5–15)
BUN: 10 mg/dL (ref 6–20)
CO2: 28 mmol/L (ref 22–32)
Calcium: 8.9 mg/dL (ref 8.9–10.3)
Chloride: 105 mmol/L (ref 98–111)
Creatinine, Ser: 0.58 mg/dL (ref 0.44–1.00)
GFR, Estimated: >60 mL/min (ref >60)
Glucose, Bld: 145 mg/dL — ABNORMAL HIGH (ref 70–99)
Potassium: 2.7 mmol/L — CL (ref 3.5–5.1)
Sodium: 142 mmol/L (ref 135–145)
Total Bilirubin: 0.8 mg/dL (ref 0.3–1.2)
Total Protein: 7.5 g/dL (ref 6.5–8.1)

## 2021-01-11 LAB — URINALYSIS, ROUTINE W REFLEX MICROSCOPIC
Bacteria, UA: NONE SEEN
Bilirubin Urine: NEGATIVE
Glucose, UA: NEGATIVE mg/dL
Hgb urine dipstick: NEGATIVE
Ketones, ur: 20 mg/dL — AB
Leukocytes,Ua: NEGATIVE
Nitrite: NEGATIVE
Protein, ur: 30 mg/dL — AB
Specific Gravity, Urine: >1.046 — ABNORMAL HIGH (ref 1.005–1.030)
pH: 7 (ref 5.0–8.0)

## 2021-01-11 LAB — TSH: TSH: 47 u[IU]/mL — ABNORMAL HIGH (ref 0.350–4.500)

## 2021-01-11 LAB — RESP PANEL BY RT-PCR (FLU A&B, COVID) ARPGX2
Influenza A by PCR: NEGATIVE
Influenza B by PCR: NEGATIVE
SARS Coronavirus 2 by RT PCR: NEGATIVE

## 2021-01-11 LAB — URINE DRUG SCREEN, QUALITATIVE (ARMC ONLY)
Amphetamines, Ur Screen: NOT DETECTED
Barbiturates, Ur Screen: NOT DETECTED
Benzodiazepine, Ur Scrn: NOT DETECTED
Cannabinoid 50 Ng, Ur ~~LOC~~: NOT DETECTED
Cocaine Metabolite,Ur ~~LOC~~: NOT DETECTED
MDMA (Ecstasy)Ur Screen: NOT DETECTED
Methadone Scn, Ur: NOT DETECTED
Opiate, Ur Screen: NOT DETECTED
Phencyclidine (PCP) Ur S: NOT DETECTED
Tricyclic, Ur Screen: NOT DETECTED

## 2021-01-11 LAB — BRAIN NATRIURETIC PEPTIDE: B Natriuretic Peptide: 29.4 pg/mL (ref 0.0–100.0)

## 2021-01-11 LAB — BLOOD GAS, VENOUS
Acid-Base Excess: 7.4 mmol/L — ABNORMAL HIGH (ref 0.0–2.0)
Bicarbonate: 32 mmol/L — ABNORMAL HIGH (ref 20.0–28.0)
O2 Saturation: 84.7 %
Patient temperature: 37
pCO2, Ven: 44 mmHg (ref 44.0–60.0)
pH, Ven: 7.47 — ABNORMAL HIGH (ref 7.250–7.430)
pO2, Ven: 46 mmHg — ABNORMAL HIGH (ref 32.0–45.0)

## 2021-01-11 LAB — LIPASE, BLOOD: Lipase: 20 U/L (ref 11–51)

## 2021-01-11 LAB — MAGNESIUM: Magnesium: 2.1 mg/dL (ref 1.7–2.4)

## 2021-01-11 LAB — T4, FREE: Free T4: 0.69 ng/dL (ref 0.61–1.12)

## 2021-01-11 MED ORDER — DIPHENHYDRAMINE HCL 50 MG/ML IJ SOLN
12.5000 mg | Freq: Three times a day (TID) | INTRAMUSCULAR | Status: DC | PRN
  Administered 2021-01-11 – 2021-01-12 (×2): 12.5 mg via INTRAVENOUS
  Filled 2021-01-11 (×3): qty 1

## 2021-01-11 MED ORDER — POTASSIUM CHLORIDE 10 MEQ/100ML IV SOLN
10.0000 meq | INTRAVENOUS | Status: DC
  Administered 2021-01-11 (×2): 10 meq via INTRAVENOUS
  Filled 2021-01-11 (×2): qty 100

## 2021-01-11 MED ORDER — LEVOTHYROXINE SODIUM 100 MCG/5ML IV SOLN: 350.0000 ug | Freq: Once | INTRAVENOUS | Status: DC

## 2021-01-11 MED ORDER — SIMVASTATIN 20 MG PO TABS
20.0000 mg | ORAL_TABLET | Freq: Every day | ORAL | Status: DC
  Administered 2021-01-11 – 2021-01-14 (×4): 20 mg via ORAL
  Filled 2021-01-11: qty 2
  Filled 2021-01-11 (×3): qty 1

## 2021-01-11 MED ORDER — ALBUTEROL SULFATE (2.5 MG/3ML) 0.083% IN NEBU: 2.5000 mg | INHALATION_SOLUTION | RESPIRATORY_TRACT | Status: DC | PRN

## 2021-01-11 MED ORDER — LISINOPRIL 10 MG PO TABS
10.0000 mg | ORAL_TABLET | Freq: Every day | ORAL | Status: DC
  Administered 2021-01-11 – 2021-01-15 (×5): 10 mg via ORAL
  Filled 2021-01-11 (×5): qty 1

## 2021-01-11 MED ORDER — LEVOTHYROXINE SODIUM 50 MCG PO TABS
350.0000 ug | ORAL_TABLET | Freq: Every day | ORAL | Status: DC
  Administered 2021-01-11: 350 ug via ORAL
  Filled 2021-01-11: qty 7

## 2021-01-11 MED ORDER — IOHEXOL 300 MG/ML  SOLN
100.0000 mL | Freq: Once | INTRAMUSCULAR | Status: AC | PRN
  Administered 2021-01-11: 100 mL via INTRAVENOUS

## 2021-01-11 MED ORDER — LEVOTHYROXINE SODIUM 100 MCG/5ML IV SOLN
175.0000 ug | Freq: Every day | INTRAVENOUS | Status: DC
  Administered 2021-01-11 – 2021-01-13 (×3): 175 ug via INTRAVENOUS
  Filled 2021-01-11 (×3): qty 10

## 2021-01-11 MED ORDER — HEPARIN SODIUM (PORCINE) 5000 UNIT/ML IJ SOLN
5000.0000 [IU] | Freq: Three times a day (TID) | INTRAMUSCULAR | Status: DC
  Administered 2021-01-12: 5000 [IU] via SUBCUTANEOUS
  Filled 2021-01-11: qty 1

## 2021-01-11 MED ORDER — ACETAMINOPHEN 325 MG PO TABS: 650.0000 mg | ORAL_TABLET | Freq: Four times a day (QID) | ORAL | Status: DC | PRN

## 2021-01-11 MED ORDER — HYDRALAZINE HCL 20 MG/ML IJ SOLN: 5.0000 mg | INTRAMUSCULAR | Status: DC | PRN

## 2021-01-11 MED ORDER — CLONAZEPAM 0.5 MG PO TABS
0.5000 mg | ORAL_TABLET | Freq: Three times a day (TID) | ORAL | Status: DC | PRN
  Administered 2021-01-11 – 2021-01-13 (×5): 0.5 mg via ORAL
  Filled 2021-01-11 (×5): qty 1

## 2021-01-11 MED ORDER — POTASSIUM CHLORIDE CRYS ER 20 MEQ PO TBCR
40.0000 meq | EXTENDED_RELEASE_TABLET | ORAL | Status: AC
  Administered 2021-01-11 (×2): 40 meq via ORAL
  Filled 2021-01-11 (×2): qty 2

## 2021-01-11 NOTE — ED Notes (Signed)
MD made aware of pt K value 2.7 via secure messaging

## 2021-01-11 NOTE — ED Notes (Addendum)
Pts bedding and gown changed. Pt given a warm blanket.

## 2021-01-11 NOTE — ED Provider Notes (Signed)
°  Emergency Medicine Provider Triage Evaluation Note  Rhonda Palmer , a 50 y.o.female,  was evaluated in triage.  Pt complains of altered mental status.  Patient presents via EMS coming from home.  Reports increased confusion over the last 2 weeks.  She had a lap band surgery approximately 1 month prior, patient was also started on levothyroxine at that time as well.  Per family, they started noticing that she was confused about 2 weeks ago and is progressively gotten worse.  She is additionally endorsing some abdominal pain as well.   Review of Systems  Positive: Abdominal pain Negative: Denies fever, chest pain, vomiting  Physical Exam   Vitals:   01/11/21 1129  BP: (!) 140/103  Pulse: 85  Resp: 16  Temp: 98.2 F (36.8 C)  SpO2: 97%   Gen:   Awake, no distress   Resp:  Normal effort  MSK:   Moves extremities without difficulty  Other:  Mild tenderness in epigastric region  Medical Decision Making  Given the patient's initial medical screening exam, the following diagnostic evaluation has been ordered. The patient will be placed in the appropriate treatment space, once one is available, to complete the evaluation and treatment. I have discussed the plan of care with the patient and I have advised the patient that an ED physician or mid-level practitioner will reevaluate their condition after the test results have been received, as the results may give them additional insight into the type of treatment they may need.    Diagnostics: Labs, UA, EKG, CXR  Treatments: none immediately   Varney Daily, PA 01/11/21 1133    Concha Se, MD 01/11/21 [phone]

## 2021-01-11 NOTE — ED Notes (Signed)
Pt alert and oriented x4. Pt having difficulty expressing themselves. When asked pt endorsing that their stomach hurts. Pt appears restless in bed,

## 2021-01-11 NOTE — ED Notes (Signed)
Pt given a cup of apple juice.  

## 2021-01-11 NOTE — Consult Note (Signed)
[facility] SURGICAL CONSULTATION NOTE    HISTORY OF PRESENT ILLNESS (HPI):  50 y.o. female presented to [facility] ED today for altered mentation and abdominal pain. Patient has a history of gastric bypass performed 1 month ago at [facility].  She has not followed up with her surgeon since the surgery.  Since that time, she states that she has had abdominal pain.  The characteristic of her abdominal pain has not changed since her discharge, and she describes the pain as a vague pain at her surgical sites.  She also confirms nausea with episodes of emesis, but she is unable to state when this started.  She is able to eat and is moving her bowels and passing flatus without difficulty.  General surgery was consulted secondary to abnormal CT findings and abdominal pain.  Her other surgeries include history of hysterectomy.  Surgery is consulted by Dr. Juliette Alcide in this context for evaluation and management abdominal pain and fat necrosis noted on CT A/P.  PAST MEDICAL HISTORY (PMH):  Past Medical History:  Diagnosis Date   (HFpEF) heart failure with preserved ejection fraction (HCC)    a. 02/2020 Echo: EF 55-60%, no rwma. Nl RV fxn.   Anxiety    Asthma    Bipolar 1 disorder (HCC)    Diabetes mellitus without complication (HCC)    Edema leg for twenty years per patient   taking lasix for edema   Fibromyalgia    History of cardiac catheterization    a. 02/2020 Cath: Nl cors. LVEDP -> torsemide increased to 40mg  daily.   Hypertension    Hypothyroidism    Morbid obesity (HCC)    Ovarian cyst    Palpitations    a. 03/2020 Zio: RSR, avg HR 84 (55-203), occas SVT runs. No significant arrhythmias.  Most triggered events associate with sinus rhythm.   Schizo affective schizophrenia (HCC)      PAST SURGICAL HISTORY (PSH):  Past Surgical History:  Procedure Laterality Date   ABDOMINAL HYSTERECTOMY     ANKLE SURGERY     pt. states long time ago   LEFT HEART CATH AND CORONARY ANGIOGRAPHY  N/A 03/13/2020   Procedure: LEFT HEART CATH AND CORONARY ANGIOGRAPHY;  Surgeon: 03/15/2020, MD;  Location: [facility] INVASIVE CV LAB;  Service: Cardiovascular;  Laterality: N/A;     MEDICATIONS:  Prior to Admission medications   Medication Sig Start Date End Date Taking? Authorizing Provider  albuterol (PROVENTIL HFA;VENTOLIN HFA) 108 (90 Base) MCG/ACT inhaler Inhale 2 puffs into the lungs every 6 (six) hours as needed for wheezing or shortness of breath.   Yes [provider]  clonazePAM (KLONOPIN) 0.5 MG tablet Take 0.5 mg by mouth 3 (three) times daily.  02/18/18  Yes [provider]  cyclobenzaprine (FLEXERIL) 5 MG tablet Take 5 mg by mouth 3 (three) times daily as needed for muscle spasms.   Yes [provider]  gabapentin (NEURONTIN) 300 MG capsule Take 300 mg by mouth 3 (three) times daily. 12/21/20  Yes [provider]  ibuprofen (ADVIL) 800 MG tablet Take 800 mg by mouth every 8 (eight) hours as needed for moderate pain.   Yes [provider]  levothyroxine (SYNTHROID) 50 MCG tablet Take 1 tablet (50 mcg total) by mouth daily. 12/06/20  Yes Wouk, 13/10/22, MD  lisinopril (ZESTRIL) 10 MG tablet Take 10 mg by mouth daily. 12/10/20  Yes [provider]  ondansetron (ZOFRAN) 4 MG tablet Take 4 mg by mouth every 8 (eight) hours  as needed. 02/27/20  Yes [provider]  QUEtiapine (SEROQUEL) 300 MG tablet Take 600 mg by mouth at bedtime. 02/16/20  Yes [provider]  simvastatin (ZOCOR) 20 MG tablet Take 20 mg by mouth at bedtime. 12/10/20  Yes [provider]  tiZANidine (ZANAFLEX) 4 MG tablet Take 1 tablet by mouth daily as needed. 05/16/20  Yes [provider]  torsemide (DEMADEX) 20 MG tablet Take 20 mg by mouth daily. 12/10/20  Yes [provider]  zolpidem (AMBIEN) 5 MG tablet Take 5 mg by mouth at bedtime as needed. 03/05/20  Yes [provider]  ARISTADA 1064 MG/3.9ML prefilled  syringe Inject 1,064 mg into the muscle every 8 (eight) weeks. Patient not taking: Reported on 01/11/2021 10/31/20   [provider]  ARISTADA INITIO 675 MG/2.4ML prefilled syringe Inject 2.4 ml intramuscularly single dose Patient not taking: Reported on 12/04/2020 02/27/20   [provider]  levothyroxine (SYNTHROID) 300 MCG tablet Take 300 mcg by mouth every morning. Patient not taking: Reported on 01/11/2021 10/24/20   [provider]     ALLERGIES:  Allergies  Allergen Reactions   Codeine Hives, Itching, Rash and Swelling   Sulfa Antibiotics Anaphylaxis   Penicillins Itching     SOCIAL HISTORY:  Social History   Socioeconomic History   Marital status: Married    Spouse name: Not on file   Number of children: Not on file   Years of education: Not on file   Highest education level: Not on file  Occupational History   Not on file  Tobacco Use   Smoking status: Never   Smokeless tobacco: Never  Vaping Use   Vaping Use: Never used  Substance and Sexual Activity   Alcohol use: No   Drug use: No   Sexual activity: Not on file  Other Topics Concern   Not on file  Social History Narrative   Not on file   Social Determinants of Health   Financial Resource Strain: Not on file  Food Insecurity: Not on file  Transportation Needs: Not on file  Physical Activity: Not on file  Stress: Not on file  Social Connections: Not on file  Intimate Partner Violence: Not on file     FAMILY HISTORY:  Family History  Problem Relation Age of Onset   Heart failure Mother    Bladder Cancer Neg Hx    Kidney cancer Neg Hx       REVIEW OF SYSTEMS:  Review of Systems  Constitutional:  Positive for malaise/fatigue. Negative for chills and fever.  Respiratory:  Positive for shortness of breath.   Cardiovascular:  Negative for chest pain and palpitations.  Gastrointestinal:  Positive for abdominal pain, nausea and vomiting.  Genitourinary: Negative.   Skin:  Negative.   Neurological:        Altered mentation   VITAL SIGNS:  Temp:  [98.2 F (36.8 C)] 98.2 F (36.8 C) (12/16 1129) Pulse Rate:  [76-88] 87 (12/16 1900) Resp:  [15-34] 20 (12/16 1900) BP: (135-164)/(84-104) 159/98 (12/16 1900) SpO2:  [94 %-99 %] 97 % (12/16 1900) Weight:  [117.9 kg] 117.9 kg (12/16 1130)     Height: 5\' 2"  (157.5 cm) Weight: 117.9 kg BMI (Calculated): 47.54   INTAKE/OUTPUT:  No intake/output data recorded.  PHYSICAL EXAM:  Physical Exam Constitutional:      General: She is not in acute distress.    Appearance: Normal appearance. She is obese. She is not toxic-appearing.  HENT:  Head: Normocephalic and atraumatic.  Cardiovascular:     Rate and Rhythm: Normal rate and regular rhythm.  Pulmonary:     Breath sounds: Normal breath sounds.  Abdominal:     Comments: Abdomen is soft, nontender, nondistended with no percussion tenderness, no rebounding, guarding, or rigidity; laparoscopic surgical sites healing well  Skin:    General: Skin is warm and dry.  Neurological:     Mental Status: She is oriented to person, place, and time.     Comments: Confused     Labs:  CBC Latest Ref Rng & Units 01/11/2021 12/05/2020 12/04/2020  WBC 4.0 - 10.5 K/uL 9.4 9.0 9.5  Hemoglobin 12.0 - 15.0 g/dL 13.7 13.0 13.3  Hematocrit 36.0 - 46.0 % 45.0 44.4 44.5  Platelets 150 - 400 K/uL 321 290 274   CMP Latest Ref Rng & Units 01/11/2021 12/05/2020 12/04/2020  Glucose 70 - 99 mg/dL 145(H) 121(H) 114(H)  BUN 6 - 20 mg/dL 10 9 10   Creatinine 0.44 - 1.00 mg/dL 0.58 0.50 0.63  Sodium 135 - 145 mmol/L 142 139 137  Potassium 3.5 - 5.1 mmol/L 2.7(LL) 3.8 3.7  Chloride 98 - 111 mmol/L 105 99 97(L)  CO2 22 - 32 mmol/L 28 34(H) 32  Calcium 8.9 - 10.3 mg/dL 8.9 8.7(L) 9.0  Total Protein 6.5 - 8.1 g/dL 7.5 7.5 7.6  Total Bilirubin 0.3 - 1.2 mg/dL 0.8 1.0 0.6  Alkaline Phos 38 - 126 U/L 136(H) 92 91  AST 15 - 41 U/L 30 17 18   ALT 0 - 44 U/L 32 21 22    Imaging studies:  CT  ABDOMEN AND PELVIS WITH CONTRAST IMPRESSION: 1. Interval postsurgical changes of the stomach and small bowel consistent with gastric bypass surgery. Linear tract in the right anterior abdominal wall with surrounding soft tissue stranding most likely due to postsurgical change/possible prior laparoscopic port placement. Heterogeneous left anterior abdominal mass containing fat and fluid density, also likely related to history of recent surgery and could represent an area of fat necrosis. There is no evidence for a bowel obstruction. 2. Hepatic steatosis 3. Punctate nonobstructing left kidney stone 4. Multiple subcutaneous nodules or possible cysts within the subcutaneous fat of the posterior flank regions  Assessment/Plan:  50 y.o. female with altered mentation and abdominal pain, s/p gastric bypass at [facility] 1 month ago.  CT abdomen and pelvis noting an anterior abdominal wall mass containing fat, likely fat necrosis.    -Suspect that this area of fat necrosis is likely devitalized omentum from her recent surgical procedure  -No concern for infection of this area at this time given patient is afebrile without a leukocytosis  -No acute surgical intervention at this time  -Okay for diet from general surgery standpoint  -Suspect that most of the patient's abdominal complaints, including nausea and vomiting, are related to her poorly controlled hypothyroidism  -Care per primary team  All of the above findings and recommendations were discussed with the patient, and all of patient's questions were answered to her expressed satisfaction.  Thank you for the opportunity to participate in this patient's care.   -- Graciella Freer, DO

## 2021-01-11 NOTE — ED Notes (Signed)
RN called Hospital doctor to ask her to look at patient

## 2021-01-11 NOTE — ED Provider Notes (Addendum)
Patient seen after signout from previous doctor.  She is now complaining of some abdominal pain.  This seems to be around the area where the mass is on the CT that has air-fluid level in it.  It surrounded by some inflammation as well.  Additionally patient's TSH is very high.  Reportedly she was recently started on levothyroxine.  TSH is still 47.  Her potassium is also low.  Low potassium and low thyroid may explain her weakness and some confusion.  Additionally there is abdominal area CT radiology feels could be an area of fat necrosis.  Patient does have a little bit of firmness in the area and is tender there as I mention.  Perhaps there is also some more inflammatory something going on.  Patient has her 16 year old daughter at home and apparently no one else she says.  I think in view of her multiple medical problems and history of hypoxia it would be better to at least observe her overnight.   Arnaldo Natal, MD 01/11/21 1549 I have called surgery consult regarding the mass.  We will then call hospitalist.   Arnaldo Natal, MD 01/11/21 [phone] Surgeon is coming.  I spoke with the hospitalist who will come down to see the patient hospitalist had asked that I try to send her back to [facility] as she was postop from there and he is worried about the development of myxedema coma if she is stressed.  [facility] however is closed to admissions.  I called them and that is what they told me.  Additionally [facility] has not been taking anybody either.   Arnaldo Natal, MD 01/11/21 1642 I have reconsulted the hospitalist.   Arnaldo Natal, MD 01/11/21 [phone]

## 2021-01-11 NOTE — ED Triage Notes (Signed)
First RN Note: pt to ED via [facility] with c/o confusion. Per EMS pt had lap band surgery 1 month ago. Per EMS pt started levothyroxine 1 month ago as well, confusion started 2 weeks ago and has progressively worsened since then. Per EMS pt A&O x4 however with some hesitancy noted in regards to answering questions.   CBG 163 98HR 96% RA 144/79

## 2021-01-11 NOTE — ED Provider Notes (Addendum)
[facility] Emergency Department Provider Note    Event Date/Time   First MD Initiated Contact with Patient 01/11/21 1232     (approximate)  I have reviewed the triage vital signs and the nursing notes.   HISTORY  Chief Complaint Altered Mental Status  Level v caveat:  poor historian  HPI Rhonda Palmer is a 50 y.o. female history of bipolar disorder asthma anxiety diabetes presents to the ER for evaluation of confusion for the past 2 weeks.  Patient denies any pain.  Says she is becoming increasingly forgetful.  Does have some shortness of breath does not wear home oxygen.  Did recently start levothyroxine no other medication changes.  Says that more month ago had gastric bypass.  Has also been admitted for respiratory failure not on any supplemental oxygen at home.  Past Medical History:  Diagnosis Date   (HFpEF) heart failure with preserved ejection fraction (HCC)    a. 02/2020 Echo: EF 55-60%, no rwma. Nl RV fxn.   Anxiety    Asthma    Bipolar 1 disorder (HCC)    Diabetes mellitus without complication (HCC)    Edema leg for twenty years per patient   taking lasix for edema   Fibromyalgia    History of cardiac catheterization    a. 02/2020 Cath: Nl cors. LVEDP -> torsemide increased to 40mg  daily.   Hypertension    Hypothyroidism    Morbid obesity (HCC)    Ovarian cyst    Palpitations    a. 03/2020 Zio: RSR, avg HR 84 (55-203), occas SVT runs. No significant arrhythmias.  Most triggered events associate with sinus rhythm.   Schizo affective schizophrenia (HCC)    Family History  Problem Relation Age of Onset   Heart failure Mother    Bladder Cancer Neg Hx    Kidney cancer Neg Hx    Past Surgical History:  Procedure Laterality Date   ABDOMINAL HYSTERECTOMY     ANKLE SURGERY     pt. states long time ago   LEFT HEART CATH AND CORONARY ANGIOGRAPHY N/A 03/13/2020   Procedure: LEFT HEART CATH AND CORONARY ANGIOGRAPHY;  Surgeon: 03/15/2020, MD;  Location: [facility] INVASIVE CV LAB;  Service: Cardiovascular;  Laterality: N/A;   Patient Active Problem List   Diagnosis Date Noted   Hypoxemia 12/05/2020   Acute on chronic respiratory failure with hypoxemia (HCC) 12/04/2020   Unstable angina (HCC)    Chest pain 03/11/2020   Normocytic anemia 03/11/2020   Bipolar 1 disorder (HCC)    Depression with anxiety    Hypertension    HLD (hyperlipidemia)    Diabetes mellitus without complication (HCC)    Morbid obesity with BMI of 60.0-69.9, adult (HCC)    Acute respiratory failure with hypoxia (HCC)    Dyspnea on exertion    Fall 03/27/2017   Long term (current) use of opiate analgesic 06/26/2016   Long term prescription opiate use 06/26/2016   Opiate use 06/26/2016   Obesity 01/17/2016   History of opioid abuse (HCC) 01/17/2016   Asthma 01/17/2016   Mixed hyperlipidemia 10/09/2015   Hypokalemia 10/09/2015   Headache above the eye region 06/16/2013   Edema of extremities 05/18/2013   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 Authorizing Provider  albuterol (PROVENTIL HFA;VENTOLIN HFA) 108 (90 Base) MCG/ACT inhaler Inhale 2 puffs into the lungs every 6 (six) hours as needed for wheezing or shortness of breath.    [provider]  ARISTADA [phone] MG/3.9ML prefilled syringe Inject 1,064 mg into the muscle every 8 (eight) weeks. 10/31/20   [provider]  ARISTADA INITIO 675 MG/2.4ML prefilled syringe Inject 2.4 ml intramuscularly single  dose Patient not taking: Reported on 12/04/2020 02/27/20   [provider]  clonazePAM (KLONOPIN) 0.5 MG tablet Take 0.5 mg by mouth 3 (three) times daily.  02/18/18   [provider]  ibuprofen (ADVIL) 800 MG tablet Take 800 mg by mouth every 8 (eight) hours as needed for moderate pain.    [provider]  levothyroxine (SYNTHROID) 300 MCG tablet Take 300 mcg by mouth every morning. 10/24/20   [provider]  levothyroxine (SYNTHROID) 50 MCG tablet Take 1 tablet (50 mcg total) by mouth daily. 12/06/20   Wouk, Wilfred Curtis, MD  ondansetron (ZOFRAN) 4 MG tablet Take 4 mg by mouth every 8 (eight) hours as needed. 02/27/20   [provider]  QUEtiapine (SEROQUEL) 300 MG tablet Take 600 mg by mouth at bedtime. 02/16/20   [provider]  zolpidem (AMBIEN) 5 MG tablet Take 5 mg by mouth at bedtime as needed. 03/05/20   [provider]    Allergies Codeine, Sulfa antibiotics, and Penicillins    Social History Social History   Tobacco Use   Smoking status: Never   Smokeless tobacco: Never  Vaping Use   Vaping Use: Never used  Substance Use Topics   Alcohol use: No   Drug use: No    Review of Systems Patient denies headaches, rhinorrhea, blurry vision, numbness, shortness of breath, chest pain, edema, cough, abdominal pain, nausea, vomiting, diarrhea, dysuria, fevers, rashes or hallucinations unless otherwise stated above in HPI. ____________________________________________   PHYSICAL EXAM:  VITAL SIGNS: Vitals:   01/11/21 1330 01/11/21 1430  BP: (!) 146/86 (!) 142/84  Pulse: 76 76  Resp: 15 15  Temp:    SpO2: 96% 94%    Constitutional: Alert chronically ill appearing Eyes: Conjunctivae are normal.  Head: Atraumatic. Nose: No congestion/rhinnorhea. Mouth/Throat: Mucous membranes are moist.   Neck: No stridor. Painless ROM.  Cardiovascular: Normal rate, regular rhythm. Grossly normal heart sounds.  Good peripheral  circulation. Respiratory: Normal respiratory effort.  No retractions. Lungs CTAB. Gastrointestinal: Soft and nontender. No distention. No abdominal bruits. No CVA tenderness. Genitourinary:  Musculoskeletal: No lower extremity tenderness nor edema.  No joint effusions. Neurologic:  difffuse intermittent tremor primarily of rue and llue, able to follow commands, no cogwheel rigidity Skin:  Skin is warm, dry and intact. No rash noted. Psychiatric: Mood and affect are normal. Speech and behavior are normal.  ____________________________________________   LABS (all labs ordered are listed, but only abnormal results are displayed)  Results for orders placed or performed during the hospital encounter of 01/11/21 (from the past 24 hour(s))  Comprehensive metabolic panel     Status: Abnormal   Collection Time: 01/11/21 11:35 AM  Result Value Ref Range   Sodium 142 135 - 145 mmol/L   Potassium 2.7 (LL) 3.5 - 5.1 mmol/L   Chloride 105 98 - 111 mmol/L   CO2 28 22 -  32 mmol/L   Glucose, Bld 145 (H) 70 - 99 mg/dL   BUN 10 6 - 20 mg/dL   Creatinine, Ser 2.53 0.44 - 1.00 mg/dL   Calcium 8.9 8.9 - 66.4 mg/dL   Total Protein 7.5 6.5 - 8.1 g/dL   Albumin 3.4 (L) 3.5 - 5.0 g/dL   AST 30 15 - 41 U/L   ALT 32 0 - 44 U/L   Alkaline Phosphatase 136 (H) 38 - 126 U/L   Total Bilirubin 0.8 0.3 - 1.2 mg/dL   GFR, Estimated >40 >34 mL/min   Anion gap 9 5 - 15  CBC with Differential     Status: Abnormal   Collection Time: 01/11/21 11:35 AM  Result Value Ref Range   WBC 9.4 4.0 - 10.5 K/uL   RBC 4.97 3.87 - 5.11 MIL/uL   Hemoglobin 13.7 12.0 - 15.0 g/dL   HCT 74.2 59.5 - 63.8 %   MCV 90.5 80.0 - 100.0 fL   MCH 27.6 26.0 - 34.0 pg   MCHC 30.4 30.0 - 36.0 g/dL   RDW 75.6 (H) 43.3 - 29.5 %   Platelets 321 150 - 400 K/uL   nRBC 0.2 0.0 - 0.2 %   Neutrophils Relative % 59 %   Neutro Abs 5.7 1.7 - 7.7 K/uL   Lymphocytes Relative 29 %   Lymphs Abs 2.7 0.7 - 4.0 K/uL   Monocytes Relative 9 %    Monocytes Absolute 0.8 0.1 - 1.0 K/uL   Eosinophils Relative 1 %   Eosinophils Absolute 0.1 0.0 - 0.5 K/uL   Basophils Relative 1 %   Basophils Absolute 0.1 0.0 - 0.1 K/uL   Immature Granulocytes 1 %   Abs Immature Granulocytes 0.05 0.00 - 0.07 K/uL  TSH     Status: Abnormal   Collection Time: 01/11/21 11:35 AM  Result Value Ref Range   TSH 47.000 (H) 0.350 - 4.500 uIU/mL  Lipase, blood     Status: None   Collection Time: 01/11/21 11:35 AM  Result Value Ref Range   Lipase 20 11 - 51 U/L  Magnesium     Status: None   Collection Time: 01/11/21 11:35 AM  Result Value Ref Range   Magnesium 2.1 1.7 - 2.4 mg/dL  T4, free     Status: None   Collection Time: 01/11/21 11:35 AM  Result Value Ref Range   Free T4 0.69 0.61 - 1.12 ng/dL  Blood gas, venous     Status: Abnormal   Collection Time: 01/11/21  3:02 PM  Result Value Ref Range   pH, Ven 7.47 (H) 7.250 - 7.430   pCO2, Ven 44 44.0 - 60.0 mmHg   pO2, Ven 46.0 (H) 32.0 - 45.0 mmHg   Bicarbonate 32.0 (H) 20.0 - 28.0 mmol/L   Acid-Base Excess 7.4 (H) 0.0 - 2.0 mmol/L   O2 Saturation 84.7 %   Patient temperature 37.0    Collection site VEIN    Sample type VENOUS    ____________________________________________  EKG My review and personal interpretation at Time: 11:38   Indication: ams  Rate: 85  Rhythm: sinus Axis: normal Other: prolonged qt, nonspecific st abn ____________________________________________  RADIOLOGY  I personally reviewed all radiographic images ordered to evaluate for the above acute complaints and reviewed radiology reports and findings.  These findings were personally discussed with the patient.  Please see medical record for radiology report.  ____________________________________________   PROCEDURES  Procedure(s) performed:  Procedures  Due to difficulty with obtaining IV  access, a 20G peripheral IV catheter was inserted using US guidance into the LUE.  The site was prepped with chlorhexidine and  allowed to dry.  The patient tolerated the procedure without any complications.   Critical Care performed: no ____________________________________________   INITIAL IMPRESSION / ASSESSMENT AND PLAN / ED COURSE  Pertinent labs & imaging results that were available during my care of the patient were reviewed by me and considered in my medical decision making (see chart for details).   DDX: Dehydration, sepsis, pna, uti, hypoglycemia, cva, drug effect, withdrawal, encephalitis   Taje Tondreau is a 50 y.o. who presents to the ED with presentation as described above with evidence of hypokalemia.  We will add on TSH she was recently started on increased levothyroxine.  Abdominal exam is benign but is complaining of pain and she is post surgery with obesity limiting her exam.  Blood work was sent for the but differential.  Lower suspicion for CVA given general malaise and weakness opposed to focal deficits.  CT head normal.  Denies injury.  Clinical Course as of 01/11/21 1519  Fri Jan 11, 2021  1328 Patient's TSH is profoundly elevated.  Patient states that she is been compliant with her medications.  Potassium also significantly low.  We will replete with IV and p.o. potassium.  Awaiting CT imaging. [PR]  1453 T4 is normal.  Patient given Synthroid for today as she did not take her morning dose. [PR]  1505 Patient states had gastric bypass performed more than a month ago and [facility] and is having worsening abdominal pain she says is affecting her p.o. intake.  Will order CT imaging.  If normal do think would be appropriate for admission here for potassium replacement.  If abnormal management consult with surgery at [facility].  Patient be signed out to oncoming physician. [PR]    Clinical Course User Index [PR] Willy Eddy, MD    The patient was evaluated in Emergency Department today for the symptoms described in the history of present illness. He/she was evaluated in the context  of the global COVID-19 pandemic, which necessitated consideration that the patient might be at risk for infection with the SARS-CoV-2 virus that causes COVID-19. Institutional protocols and algorithms that pertain to the evaluation of patients at risk for COVID-19 are in a state of rapid change based on information released by regulatory bodies including the CDC and federal and state organizations. These policies and algorithms were followed during the patient's care in the ED.  As part of my medical decision making, I reviewed the following data within the electronic MEDICAL RECORD NUMBER Nursing notes reviewed and incorporated, Labs reviewed, notes from prior ED visits and [place] Controlled Substance Database   ____________________________________________   FINAL CLINICAL IMPRESSION(S) / ED DIAGNOSES  Final diagnoses:  Confusion  Weakness  Hypokalemia      NEW MEDICATIONS STARTED DURING THIS VISIT:  New Prescriptions   No medications on file     Note:  This document was prepared using Dragon voice recognition software and may include unintentional dictation errors.    Willy Eddy, MD 01/11/21 4098    Willy Eddy, MD 01/11/21 [phone]

## 2021-01-11 NOTE — ED Triage Notes (Signed)
Pt to ED via [facility] from home for increased confusion over the last 2 weeks. Pt had lap band surgery 1 month ago, pt was also started on levothyroxine at that time as well. Pts family started to notice confusion 2 weeks ago and it has progressively gotten worse since them. Pt states that she is having abdominal pain but cannot give clear answer on when it started. Pt disoriented to year but knows the month. Pt oriented to place. Pt is in NAD.

## 2021-01-11 NOTE — H&P (Signed)
History and Physical    Rhonda Palmer [id number] DOB: August 02, 1970 DOA: 01/11/2021  Referring MD/NP/PA:   PCP: Rhonda Bon, NP   Patient coming from:  The patient is coming from home.  At baseline, pt is independent for most of ADL.        Chief Complaint: AMS and abdominal pain  HPI: Rhonda Palmer is a 50 y.o. 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  Per her husband (I called her husband by phone). Patient recently had gastric bypass surgery in [facility] about 3 weeks ago.  Patient has been confused in the past 2 weeks. Thought confused, she is still orientated x3 currently.  She could tell most of her medical history. Patient has generalized weakness, but no unilateral numbness or tinglings in extremities, no facial droop or slurred speech.  Patient also complains of abdominal pain, which is mainly located in the right middle quadrant, constant, sharp, moderate, nonradiating.  Patient states that she has nausea and vomited few times with nonbilious nonbloody vomiting.  Patient also reports diarrhea 3-4 times.  She states that she has mild dry cough, mild shortness of breath, no fever or chills.  She had some chest discomfort earlier which has resolved.  No symptoms of UTI. Of note, patient is taking multiple sedative medications, including Klonopin, Neurontin, Flexeril, Seroquel, tizanidine, Ambien.  ED Course: pt was found to have TSH of 47, free T4 0.69, lipase 20, pending COVID PCR, potassium 2.7, magnesium 2.1, renal function okay, temperature normal, blood pressure 149/104, heart rate 85, RR 21, oxygen saturation 98% on room air,  temperature normal.  VBG with a pH of 7.47, CO2 44, O2 46.  Chest x-ray is negative for infiltration.  CT of head is negative for acute intracranial abnormalities.  Patient is admitted to [place] bed as inpatient.  General surgery is consulted.  CT-abd/pelvis: 1. Interval postsurgical changes of the stomach and small bowel consistent with gastric bypass surgery. Linear tract in the right anterior abdominal wall with surrounding soft tissue stranding most likely due to postsurgical change/possible prior laparoscopic port placement. Heterogeneous left anterior abdominal mass containing fat and fluid density, also likely related to history of recent surgery and could represent an area of fat necrosis. There is no evidence for a bowel obstruction. 2. Hepatic steatosis 3. Punctate nonobstructing left kidney stone 4. Multiple subcutaneous nodules or possible cysts within the subcutaneous fat of the posterior flank regions   Review of Systems:   General: no fevers, chills, no body weight gain, has poor appetite, has fatigue HEENT: no blurry vision, hearing changes or sore throat Respiratory: has dyspnea, coughing, no wheezing CV: no chest pain, no palpitations GI: has nausea, vomiting, abdominal pain, diarrhea, no constipation GU: no dysuria, burning on urination, increased urinary frequency, hematuria  Ext: has trace leg edema Neuro: no unilateral weakness, numbness, or tingling, no vision change or hearing loss. has AMS Skin: no rash, no skin tear. MSK: No muscle spasm, no deformity, no limitation of range of movement in spin Heme: No easy bruising.  Travel history: No recent long distant travel.  Allergy:  Allergies  Allergen Reactions   Codeine Hives, Itching, Rash and Swelling   Sulfa Antibiotics Anaphylaxis   Penicillins Itching    Past  Medical History:  Diagnosis Date   (HFpEF) heart failure with preserved ejection fraction ([place])    a. 02/2020 Echo: EF 55-60%, no rwma. Nl RV  fxn.   Anxiety    Asthma    Bipolar 1 disorder ([place])    Diabetes mellitus without complication ([place])    Edema leg for twenty years per patient   taking lasix for edema   Fibromyalgia    History of cardiac catheterization    a. 02/2020 Cath: Nl cors. LVEDP 56mmHg -> torsemide increased to 40mg  daily.   Hypertension    Hypothyroidism    Morbid obesity ([place])    Ovarian cyst    Palpitations    a. 03/2020 Zio: RSR, avg HR 84 (55-203), occas SVT runs. No significant arrhythmias.  Most triggered events associate with sinus rhythm.   Schizo affective schizophrenia [facility])     Past Surgical History:  Procedure Laterality Date   ABDOMINAL HYSTERECTOMY     ANKLE SURGERY     pt. states long time ago   LEFT HEART CATH AND CORONARY ANGIOGRAPHY N/A 03/13/2020   Procedure: LEFT HEART CATH AND CORONARY ANGIOGRAPHY;  Surgeon: Nelva Bush, MD;  Location: [facility];  Service: Cardiovascular;  Laterality: N/A;    Social History:  reports that she has never smoked. She has never used smokeless tobacco. She reports that she does not drink alcohol and does not use drugs.  Family History:  Family History  Problem Relation Age of Onset   Heart failure Mother    Bladder Cancer Neg Hx    Kidney cancer Neg Hx      Prior to Admission medications   Medication Sig Start Date End Date Taking? Authorizing Provider  albuterol (PROVENTIL HFA;VENTOLIN HFA) 108 (90 Base) MCG/ACT inhaler Inhale 2 puffs into the lungs every 6 (six) hours as needed for wheezing or shortness of breath.    [provider]  ARISTADA [phone] MG/3.9ML prefilled syringe Inject 1,064 mg into the muscle every 8 (eight) weeks. 10/31/20   [provider]  ARISTADA INITIO 675 MG/2.4ML prefilled syringe Inject 2.4 ml intramuscularly single dose Patient not taking: Reported on 12/04/2020 02/27/20   [provider]  clonazePAM (KLONOPIN) 0.5 MG tablet Take 0.5 mg by mouth 3 (three) times daily.  02/18/18    [provider]  ibuprofen (ADVIL) 800 MG tablet Take 800 mg by mouth every 8 (eight) hours as needed for moderate pain.    [provider]  levothyroxine (SYNTHROID) 300 MCG tablet Take 300 mcg by mouth every morning. 10/24/20   [provider]  levothyroxine (SYNTHROID) 50 MCG tablet Take 1 tablet (50 mcg total) by mouth daily. 12/06/20   Wouk, Ailene Rud, MD  ondansetron (ZOFRAN) 4 MG tablet Take 4 mg by mouth every 8 (eight) hours as needed. 02/27/20   [provider]  QUEtiapine (SEROQUEL) 300 MG tablet Take 600 mg by mouth at bedtime. 02/16/20   [provider]  zolpidem (AMBIEN) 5 MG tablet Take 5 mg by mouth at bedtime as needed. 03/05/20   [provider]    Physical Exam: Vitals:   01/11/21 1730 01/11/21 1800 01/11/21 1830 01/11/21 1900  BP: (!) 164/88 135/89 (!) 147/89 (!) 159/98  Pulse: 80 77 88 87  Resp: (!) 23 (!) 25 20 20   Temp:      TempSrc:      SpO2: 97% 94% 99% 97%  Weight:      Height:       General:  Not in acute distress HEENT:       Eyes: PERRL, EOMI, no scleral icterus.       ENT: No discharge from the ears and nose, no pharynx injection, no tonsillar enlargement.        Neck: No JVD, no bruit, no mass felt. Heme: No neck lymph node enlargement. Cardiac: S1/S2, RRR, No murmurs, No gallops or rubs. Respiratory: No rales, wheezing, rhonchi or rubs. GI: Soft, nondistended, has tenderness in RMQ, no organomegaly, BS present. GU: No hematuria Ext: has trace leg edema bilaterally. 1+DP/PT pulse bilaterally. Musculoskeletal: No joint deformities, No joint redness or warmth, no limitation of ROM in spin. Skin: No rashes.  Neuro: confused, but is still oriented X3, cranial nerves II-XII grossly intact, moves all extremities normally.  Psych: Patient is not psychotic, no suicidal or hemocidal ideation.  Labs on Admission: I have personally reviewed following labs and imaging studies  CBC: Recent Labs  Lab  01/11/21 1135  WBC 9.4  NEUTROABS 5.7  HGB 13.7  HCT 45.0  MCV 90.5  PLT 321   Basic Metabolic Panel: Recent Labs  Lab 01/11/21 1135  NA 142  K 2.7*  CL 105  CO2 28  GLUCOSE 145*  BUN 10  CREATININE 0.58  CALCIUM 8.9  MG 2.1   GFR: Estimated Creatinine Clearance: 100.3 mL/min (by C-G formula based on SCr of 0.58 mg/dL). Liver Function Tests: Recent Labs  Lab 01/11/21 1135  AST 30  ALT 32  ALKPHOS 136*  BILITOT 0.8  PROT 7.5  ALBUMIN 3.4*   Recent Labs  Lab 01/11/21 1135  LIPASE 20   No results for input(s): AMMONIA in the last 168 hours. Coagulation Profile: No results for input(s): INR, PROTIME in the last 168 hours. Cardiac Enzymes: No results for input(s): CKTOTAL, CKMB, CKMBINDEX, TROPONINI in the last 168 hours. BNP (last 3 results) No results for input(s): PROBNP in the last 8760 hours. HbA1C: No results for input(s): HGBA1C in the last 72 hours. CBG: No results for input(s): GLUCAP in the last 168 hours. Lipid Profile: No results for input(s): CHOL, HDL, LDLCALC, TRIG, CHOLHDL, LDLDIRECT in the last 72 hours. Thyroid Function Tests: Recent Labs    01/11/21 1135  TSH 47.000*  FREET4 0.69   Anemia Panel: No results for input(s): VITAMINB12, FOLATE, FERRITIN, TIBC, IRON, RETICCTPCT in the last 72 hours. Urine analysis:    Component Value Date/Time   COLORURINE YELLOW (A) 12/04/2020 1524   APPEARANCEUR HAZY (A) 12/04/2020 1524   APPEARANCEUR Clear 04/29/2017 0918   LABSPEC 1.029 12/04/2020 1524   PHURINE 6.0 12/04/2020 1524   GLUCOSEU >=500 (A) 12/04/2020 1524   HGBUR NEGATIVE 12/04/2020 1524   BILIRUBINUR NEGATIVE 12/04/2020 1524   BILIRUBINUR Negative 04/29/2017 0918   KETONESUR 20 (A) 12/04/2020 1524   PROTEINUR 30 (A) 12/04/2020 1524   NITRITE NEGATIVE 12/04/2020 1524   LEUKOCYTESUR NEGATIVE 12/04/2020 1524   Sepsis Labs: @LABRCNTIP (procalcitonin:4,lacticidven:4) ) Recent Results (from the past 240 hour(s))  Resp Panel by  RT-PCR (Flu A&B, Covid) Nasopharyngeal Swab     Status: None   Collection Time: 01/11/21  4:08 PM   Specimen: Nasopharyngeal Swab; Nasopharyngeal(NP) swabs in vial transport medium  Result Value Ref Range Status   SARS Coronavirus 2 by RT PCR NEGATIVE NEGATIVE Final    Comment: (NOTE) SARS-CoV-2 target nucleic acids are NOT DETECTED.  The SARS-CoV-2 RNA is generally detectable in upper respiratory specimens during the acute phase of infection. The lowest concentration of SARS-CoV-2 viral copies this assay can  detect is 138 copies/mL. A negative result does not preclude SARS-Cov-2 infection and should not be used as the sole basis for treatment or other patient management decisions. A negative result may occur with  improper specimen collection/handling, submission of specimen other than nasopharyngeal swab, presence of viral mutation(s) within the areas targeted by this assay, and inadequate number of viral copies(<138 copies/mL). A negative result must be combined with clinical observations, patient history, and epidemiological information. The expected result is Negative.  Fact Sheet for Patients:  EntrepreneurPulse.com.au  Fact Sheet for Healthcare Providers:  IncredibleEmployment.be  This test is no t yet approved or cleared by the [place] FDA and  has been authorized for detection and/or diagnosis of SARS-CoV-2 by FDA under an Emergency Use Authorization (EUA). This EUA will remain  in effect (meaning this test can be used) for the duration of the COVID-19 declaration under Section 564(b)(1) of the Act, 21 U.S.C.section 360bbb-3(b)(1), unless the authorization is terminated  or revoked sooner.       Influenza A by PCR NEGATIVE NEGATIVE Final   Influenza B by PCR NEGATIVE NEGATIVE Final    Comment: (NOTE) The Xpert Xpress SARS-CoV-2/FLU/RSV plus assay is intended as an aid in the diagnosis of influenza from Nasopharyngeal swab  specimens and should not be used as a sole basis for treatment. Nasal washings and aspirates are unacceptable for Xpert Xpress SARS-CoV-2/FLU/RSV testing.  Fact Sheet for Patients: EntrepreneurPulse.com.au  Fact Sheet for Healthcare Providers: IncredibleEmployment.be  This test is not yet approved or cleared by the [place] FDA and has been authorized for detection and/or diagnosis of SARS-CoV-2 by FDA under an Emergency Use Authorization (EUA). This EUA will remain in effect (meaning this test can be used) for the duration of the COVID-19 declaration under Section 564(b)(1) of the Act, 21 U.S.C. section 360bbb-3(b)(1), unless the authorization is terminated or revoked.  Performed at [facility], [address] [place], [place] [address]      Radiological Exams on Admission: DG Chest 2 View  Result Date: 01/11/2021 CLINICAL DATA:  Altered mental status for 2 weeks, progressively worsening EXAM: CHEST - 2 VIEW COMPARISON:  12/04/2020 FINDINGS: Upper normal heart size with normal pulmonary vascularity. Chronic enlargement of RIGHT hilum. Postsurgical changes RIGHT upper lobe with scarring. No definite infiltrate, pleural effusion, or pneumothorax. Osseous structures demineralized. IMPRESSION: Postsurgical changes and scarring in RIGHT upper lobe. Chronic prominence of RIGHT hilum, unchanged, with no hilar mass/adenopathy identified on recent CT. No new abnormalities. Electronically Signed   By: Lavonia Dana M.D.   On: 01/11/2021 12:08   CT HEAD WO CONTRAST (5MM)  Result Date: 01/11/2021 CLINICAL DATA:  Mental status change EXAM: CT HEAD WITHOUT CONTRAST TECHNIQUE: Contiguous axial images were obtained from the base of the skull through the vertex without intravenous contrast. COMPARISON:  Head CT 12/04/2020 FINDINGS: Brain: No evidence of acute infarction, hemorrhage, hydrocephalus, extra-axial collection or mass lesion/mass effect.  Vascular: Negative for hyperdense vessel Skull: Negative Sinuses/Orbits: Paranasal sinuses clear.  Negative orbit Other: None IMPRESSION: Negative CT head Electronically Signed   By: Franchot Gallo M.D.   On: 01/11/2021 13:59   CT ABDOMEN PELVIS W CONTRAST  Result Date: 01/11/2021 CLINICAL DATA:  Confusion history of surgery 1 month ago EXAM: CT ABDOMEN AND PELVIS WITH CONTRAST TECHNIQUE: Multidetector CT imaging of the abdomen and pelvis was performed using the standard protocol following bolus administration of intravenous contrast. CONTRAST:  153mL OMNIPAQUE IOHEXOL 300 MG/ML  SOLN COMPARISON:  CT 11/26/2018 FINDINGS: Lower chest:  Lung bases demonstrate no acute consolidation or effusion. Normal cardiac size. Hepatobiliary: Heterogeneous hepatic steatosis. Status post cholecystectomy. No biliary dilatation Pancreas: Unremarkable. No pancreatic ductal dilatation or surrounding inflammatory changes. Spleen: Normal in size without focal abnormality. Adrenals/Urinary Tract: Adrenal glands are normal. Kidneys show no hydronephrosis. Punctate stone in the mid left kidney. The bladder is normal Stomach/Bowel: Interval postsurgical changes of the stomach consistent with gastric bypass. No dilated small bowel to suggest an obstruction. There is no acute bowel wall thickening. The appendix is negative. Vascular/Lymphatic: Nonaneurysmal aorta.  No suspicious lymph nodes Reproductive: Status post hysterectomy. No adnexal masses. Other: Negative for pelvic effusion or free air. Linear density with surrounding soft tissue stranding in the right abdominal subcutaneous fat, extending to the cutaneous surface and right rectus presumably related to history of recent surgery. Heterogeneous mass measuring 6.9 by 5.1 cm within the left anterior abdominal mesentery, contains fluid level and internal fat density. Multiple subcutaneous nodules within the posterior flank regions, measuring up to 32 mm. Some are fluid density and  some are more hyperdense. Musculoskeletal: No acute osseous abnormality IMPRESSION: 1. Interval postsurgical changes of the stomach and small bowel consistent with gastric bypass surgery. Linear tract in the right anterior abdominal wall with surrounding soft tissue stranding most likely due to postsurgical change/possible prior laparoscopic port placement. Heterogeneous left anterior abdominal mass containing fat and fluid density, also likely related to history of recent surgery and could represent an area of fat necrosis. There is no evidence for a bowel obstruction. 2. Hepatic steatosis 3. Punctate nonobstructing left kidney stone 4. Multiple subcutaneous nodules or possible cysts within the subcutaneous fat of the posterior flank regions Electronically Signed   By: Donavan Foil M.D.   On: 01/11/2021 15:36     EKG: I have personally reviewed.  Sinus rhythm, QTC 761 which is questionable, early R wave progression, nonspecific T wave change  Assessment/Plan Principal Problem:   Acute metabolic encephalopathy Active Problems:   Hypothyroidism   Hypokalemia   Asthma   Bipolar 1 disorder (HCC)   Depression with anxiety   Hypertension   HLD (hyperlipidemia)   Abdominal pain   Obesity, Class III, BMI 40-49.9 (morbid obesity) (HCC)   Prolonged QT interval   Diarrhea   Acute metabolic encephalopathy: CT head negative for acute intracranial abnormalities.  I suspect the patient may have polypharmacy.  Patient is on multiple sedative medications, including Klonopin, Neurontin, Flexeril, Seroquel, tizanidine, Ambien.  Another potential differential diagnosis is myxedema coma since patient has poorly controlled hypothyroidism.  TSH is 47.  But the patient has normal free T4 0.69, no hypoglycemia, no hypothermia, heart rate 85, clinically does not seem to have myxedema coma.  -Admitted to telemetry MedSurg bed as inpatient -Frequent neuro check -Hold sedative medications -Fall  precaution  Hypothyroidism: TSH 47, free T4 normal 0.69 -Patient received 1 dose of home Synthroid 350 mcg in ED -Will give 175 mcg daily by IV from -Recheck TSH in 4 weeks  Hypokalemia: Potassium 2.7.  Magnesium 2.1 -Repleted potassium  Asthma: Stable -Bronchodilators  History of bipolar 1 disorder, depression with anxiety -Hold home sedative medications including Klonopin, Seroquel  Hypertension -IV hydralazine as needed -Lisinopril,  HLD (hyperlipidemia) -Zocor  Abdominal pain: Patient had gastric bypass surgery.  Consulted general surgery. Dr. Derryl Harbor evaluate the patient, thinks that this is likely due to fat necrosis of the omentum related to her recent surgery.  -As needed morphine for pain -As needed Benadryl for nausea vomiting  Obesity, Class III,  BMI 40-49.9 (morbid obesity) ([place]) -S/p of gastric bypass surgery  Prolonged QT interval: QTC was read as 761, but due to me this does not seem to be correct -Repeat EKG -Avoid using Zofran  Diarrhea -Check C. difficile and GI pathogen panel         DVT ppx: SQ Heparin    Code Status: Full code Family Communication: Yes, patient's husband by phone Disposition Plan:  Anticipate discharge back to previous environment Consults called:  Dr. Derryl Harbor Admission status and Level of care: Telemetry Medical:   as inpt    Status is: Inpatient  Remains inpatient appropriate because: Patient has multiple comorbidities, now presents with altered mental status, abdominal pain, poorly controlled hypothyroidism.  Her presentation is highly complicated.  Patient is at high risk of deteriorating.  Need to be treated in hospital for at least 2 days.           Date of Service 01/11/2021    Ivor Costa [facility]   If 7PM-7AM, please contact night-coverage [email or web address] 01/11/2021, 7:20 PM

## 2021-01-12 LAB — GASTROINTESTINAL PANEL BY PCR, STOOL (REPLACES STOOL CULTURE)

## 2021-01-12 LAB — BASIC METABOLIC PANEL
Anion gap: 8 (ref 5–15)
BUN: 10 mg/dL (ref 6–20)
CO2: 28 mmol/L (ref 22–32)
Calcium: 8.6 mg/dL — ABNORMAL LOW (ref 8.9–10.3)
Chloride: 106 mmol/L (ref 98–111)
Creatinine, Ser: 0.51 mg/dL (ref 0.44–1.00)
GFR, Estimated: >60 mL/min (ref >60)
Glucose, Bld: 129 mg/dL — ABNORMAL HIGH (ref 70–99)
Potassium: 2.8 mmol/L — ABNORMAL LOW (ref 3.5–5.1)
Sodium: 142 mmol/L (ref 135–145)

## 2021-01-12 LAB — C DIFFICILE QUICK SCREEN W PCR REFLEX
C Diff antigen: NEGATIVE
C Diff interpretation: NOT DETECTED
C Diff toxin: NEGATIVE

## 2021-01-12 LAB — GLUCOSE, CAPILLARY: Glucose-Capillary: 133 mg/dL — ABNORMAL HIGH (ref 70–99)

## 2021-01-12 MED ORDER — ENOXAPARIN SODIUM 60 MG/0.6ML IJ SOSY
0.5000 mg/kg | PREFILLED_SYRINGE | INTRAMUSCULAR | Status: DC
  Administered 2021-01-12: 21:00:00 60 mg via SUBCUTANEOUS
  Filled 2021-01-12: qty 0.6

## 2021-01-12 MED ORDER — ONDANSETRON HCL 4 MG/2ML IJ SOLN
4.0000 mg | INTRAMUSCULAR | Status: DC | PRN
  Administered 2021-01-12 – 2021-01-15 (×4): 4 mg via INTRAVENOUS
  Filled 2021-01-12 (×4): qty 2

## 2021-01-12 MED ORDER — SUCRALFATE 1 GM/10ML PO SUSP
1.0000 g | Freq: Three times a day (TID) | ORAL | Status: DC
  Administered 2021-01-12 – 2021-01-15 (×11): 1 g via ORAL
  Filled 2021-01-12 (×11): qty 10

## 2021-01-12 MED ORDER — POTASSIUM CHLORIDE CRYS ER 20 MEQ PO TBCR
40.0000 meq | EXTENDED_RELEASE_TABLET | Freq: Two times a day (BID) | ORAL | Status: AC
  Administered 2021-01-12 (×2): 40 meq via ORAL
  Filled 2021-01-12 (×2): qty 2

## 2021-01-12 MED ORDER — PANTOPRAZOLE SODIUM 40 MG PO TBEC
40.0000 mg | DELAYED_RELEASE_TABLET | Freq: Two times a day (BID) | ORAL | Status: DC
  Administered 2021-01-12 – 2021-01-15 (×7): 40 mg via ORAL
  Filled 2021-01-12 (×7): qty 1

## 2021-01-12 MED ORDER — POTASSIUM CHLORIDE 10 MEQ/100ML IV SOLN
10.0000 meq | INTRAVENOUS | Status: DC
  Administered 2021-01-12: 11:00:00 10 meq via INTRAVENOUS
  Filled 2021-01-12: qty 100

## 2021-01-12 MED ORDER — POTASSIUM CHLORIDE 10 MEQ/100ML IV SOLN
10.0000 meq | INTRAVENOUS | Status: AC
  Administered 2021-01-12 (×3): 10 meq via INTRAVENOUS
  Filled 2021-01-12 (×3): qty 100

## 2021-01-12 NOTE — Progress Notes (Signed)
[facility] Day(s): 1.   Interval History: Patient seen and examined, no acute events or new complaints overnight. Patient reports feeling less confused this morning, however she continues to report feeling jittery.  She confirms feeling nauseous with episodes of emesis, though the emesis that she showed what appeared to be chewed food that was spit out.  She continues to complain of incisional abdominal tenderness to palpation.  She confirms passing flatus and had a loose bowel movement overnight.  He continues to be hypokalemic.  Review of Systems:  Constitutional: denies fever, chills  HEENT: denies cough or congestion  Respiratory: denies any shortness of breath  Cardiovascular: denies chest pain or palpitations  Gastrointestinal: Confirms nausea, vomiting, abdominal pain, diarrhea Genitourinary: denies burning with urination or urinary frequency Musculoskeletal: denies pain, decreased motor or sensation Integumentary: denies any other rashes or skin discolorations Neurological: denies HA or vision/hearing changes   Vital signs in last 24 hours: [min-max] current  Temp:  [98.2 F (36.8 C)-99.8 F (37.7 C)] 98.6 F (37 C) (12/17 0903) Pulse Rate:  [76-88] 76 (12/17 0903) Resp:  [15-34] 18 (12/17 0903) BP: (106-164)/(67-104) 151/95 (12/17 0903) SpO2:  [94 %-99 %] 94 % (12/17 0903) Weight:  [117.9 kg] 117.9 kg (12/16 1130)     Height: 5\' 2"  (157.5 cm) Weight: 117.9 kg BMI (Calculated): 47.54   Intake/Output last 2 shifts:  No intake/output data recorded.   Physical Exam:  Constitutional: alert, cooperative and no distress  HENT: normocephalic without obvious abnormality  Eyes: PERRL, EOM's grossly intact and symmetric  Respiratory: breathing non-labored at rest  Cardiovascular: regular rate and sinus rhythm  Gastrointestinal: Soft, minimal tenderness to palpation, nondistended, no rigidity, guarding, rebound tenderness back:  Surgical sites are healing well Musculoskeletal: no edema or wounds, motor and sensation grossly intact, NT    Labs:  CBC Latest Ref Rng & Units 01/11/2021 12/05/2020 12/04/2020  WBC 4.0 - 10.5 K/uL 9.4 9.0 9.5  Hemoglobin 12.0 - 15.0 g/dL 13.7 13.0 13.3  Hematocrit 36.0 - 46.0 % 45.0 44.4 44.5  Platelets 150 - 400 K/uL 321 290 274   CMP Latest Ref Rng & Units 01/12/2021 01/11/2021 12/05/2020  Glucose 70 - 99 mg/dL 129(H) 145(H) 121(H)  BUN 6 - 20 mg/dL 10 10 9   Creatinine 0.44 - 1.00 mg/dL 0.51 0.58 0.50  Sodium 135 - 145 mmol/L 142 142 139  Potassium 3.5 - 5.1 mmol/L 2.8(L) 2.7(LL) 3.8  Chloride 98 - 111 mmol/L 106 105 99  CO2 22 - 32 mmol/L 28 28 34(H)  Calcium 8.9 - 10.3 mg/dL 8.6(L) 8.9 8.7(L)  Total Protein 6.5 - 8.1 g/dL - 7.5 7.5  Total Bilirubin 0.3 - 1.2 mg/dL - 0.8 1.0  Alkaline Phos 38 - 126 U/L - 136(H) 92  AST 15 - 41 U/L - 30 17  ALT 0 - 44 U/L - 32 21    Imaging studies: No new pertinent imaging studies   Assessment/Plan:  50 y.o. female w who was admitted for altered mentation, hypothyroidism, and abdominal pain, s/p gastric bypass surgery at [facility] 1 month ago.  CT A/P demonstrates anterior abdominal wall mass containing fat, likely fat necrosis   -Suspect the abdominal wall mass is fat necrosis secondary to devitalized omentum from her recent surgical procedure  -No concern for infection of this area  -Hypothyroidism might be secondary to malabsorption status post gastric bypass surgery  -Regular diet  -Continue Protonix and sucralfate  -No acute surgical intervention at  this time, recommended the patient follow-up outpatient with her surgeon at [facility]  -Further recommendations per primary team  All of the above findings and recommendations were discussed with the patient, and the medical team, and all of patient's questions were answered to her expressed satisfaction.   -- Theophilus Kinds, DO

## 2021-01-12 NOTE — Progress Notes (Signed)
Assumed care of patient at 1400. Shea Alvin Diffee, RN ° °

## 2021-01-12 NOTE — Progress Notes (Signed)
PROGRESS NOTE    Rhonda Palmer  [id number] DOB: 09-30-1970 DOA: 01/11/2021 PCP: Gae Bon, NP   Chief complaint.  Abdominal pain. Brief Narrative:  Rhonda Palmer is a 50 y.o. 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 CT scan showed postsurgical changes, left anterior abdominal mass, containing fat and a fluid density, could represent an area of fat necrosis.  Assessment & Plan:   Principal Problem:   Acute metabolic encephalopathy Active Problems:   Hypothyroidism   Hypokalemia   Asthma   Bipolar 1 disorder (HCC)   Depression with anxiety   Hypertension   HLD (hyperlipidemia)   Abdominal pain   Obesity, Class III, BMI 40-49.9 (morbid obesity) (HCC)   Prolonged QT interval   Diarrhea  Abdominal pain. Status post gastric bypass surgery 2 weeks ago. Morbid obesity. Patient has been having abdominal pain and diarrhea since surgery.  Has some mild nausea. CT scan is reassuring, only showed some abdominal wall fat necrosis. Abdominal pain and diarrhea appear to be due to gastric bypass surgery.  Differential including marginal ulceration.  We will start Protonix 40 mg oral twice a day with the sucralfate. Has received the fluids, she does not looks dehydrated at this time. Stool C. difficile toxin ordered, patient unlikely to have C. difficile.  Hopefully will improve after giving Protonix and sucralfate.  Hypokalemia. Potassium 2.8 today, will give 40 mg IV KCl. Recheck a BMP, mag and Phos tomorrow.  Acute metabolic encephalopathy. Appeared due to medication effect with dehydration.  Most of the medicines are on hold  right now.  Patient mental status has improved.  Hypothyroidism. Patient was taking higher dose of Synthroid at home.  She may have malabsorption from gastric bypass surgery. She is currently treated with IV Synthroid.  May transition back to oral once diarrhea resolves.  Essential hypertension  Continue to follow.  Bipolar 1 disorder, depression and anxiety. Patient home medicine was on hold, may restart once mental status is totally improved.    DVT prophylaxis: Lovenox Code Status: Full Family Communication:  Disposition Plan:    Status is: Inpatient  Remains inpatient appropriate because: Severity of disease.        No intake/output data recorded. No intake/output data recorded.     Consultants:  GI  Procedures: None  Antimicrobials: None  Subjective: Patient doing well today, no longer has any confusion.  Still, complaining of intermittent abdominal pain.  But did not have any diarrhea since admission. Denies any short of breath or cough. No fever or chills. No dysuria hematuria.  Objective: Vitals:   01/11/21 2243 01/12/21 0004 01/12/21 0445 01/12/21 0903  BP: 133/75 112/74 124/77 (!) 151/95  Pulse: 81 78 81 76  Resp: 19 16 18 18   Temp: 98.9 F (37.2 C) 99.8 F (37.7 C) 99.4 F (37.4 C) 98.6 F (37 C)  TempSrc:  Oral Oral Oral  SpO2: 97% 97% 94% 94%  Weight:      Height:       No intake or output data in the 24 hours ending 01/12/21 0955 Filed Weights   01/11/21 1130  Weight: 117.9 kg    Examination:  General exam: Appears calm and comfortable, morbid obese. Respiratory system: Clear to auscultation. Respiratory effort normal. Cardiovascular system:  S1 & S2 heard, RRR. No JVD, murmurs, rubs, gallops or clicks. No pedal edema. Gastrointestinal system: Abdomen is nondistended, soft and RUQ tender, negative Murphy sign.  No organomegaly or masses felt. Normal bowel sounds heard. Central nervous system: Alert and oriented. No focal  neurological deficits. Extremities: Symmetric 5 x 5 power. Skin: No rashes, lesions or ulcers Psychiatry: Judgement and insight appear normal. Mood & affect appropriate.     Data Reviewed: I have personally reviewed following labs and imaging studies  CBC: Recent Labs  Lab 01/11/21 1135  WBC 9.4  NEUTROABS 5.7  HGB 13.7  HCT 45.0  MCV 90.5  PLT [id number]   Basic Metabolic Panel: Recent Labs  Lab 01/11/21 1135 01/12/21 0504  NA 142 142  K 2.7* 2.8*  CL 105 106  CO2 28 28  GLUCOSE 145* 129*  BUN 10 10  CREATININE 0.58 0.51  CALCIUM 8.9 8.6*  MG 2.1  --    GFR: Estimated Creatinine Clearance: 100.3 mL/min (by C-G formula based on SCr of 0.51 mg/dL). Liver Function Tests: Recent Labs  Lab 01/11/21 1135  AST 30  ALT 32  ALKPHOS 136*  BILITOT 0.8  PROT 7.5  ALBUMIN 3.4*   Recent Labs  Lab 01/11/21 1135  LIPASE 20   No results for input(s): AMMONIA in the last 168 hours. Coagulation Profile: No results for input(s): INR, PROTIME in the last 168 hours. Cardiac Enzymes: No results for input(s): CKTOTAL, CKMB, CKMBINDEX, TROPONINI in the last 168 hours. BNP (last 3 results) No results for input(s): PROBNP in the last 8760 hours. HbA1C: No results for input(s): HGBA1C in the last 72 hours. CBG: Recent Labs  Lab 01/12/21 0905  GLUCAP 133*   Lipid Profile: No results for input(s): CHOL, HDL, LDLCALC, TRIG, CHOLHDL, LDLDIRECT in the last 72 hours. Thyroid Function Tests: Recent Labs    01/11/21 1135  TSH 47.000*  FREET4 0.69   Anemia Panel: No results for input(s): VITAMINB12, FOLATE, FERRITIN, TIBC, IRON, RETICCTPCT in the last 72 hours. Sepsis Labs: No results for input(s): PROCALCITON, LATICACIDVEN in the last 168 hours.  Recent Results (from the past 240 hour(s))  Resp Panel by RT-PCR (Flu A&B, Covid) Nasopharyngeal Swab     Status: None   Collection Time: 01/11/21  4:08 PM   Specimen: Nasopharyngeal Swab; Nasopharyngeal(NP) swabs in vial transport  medium  Result Value Ref Range Status   SARS Coronavirus 2 by RT PCR NEGATIVE NEGATIVE Final    Comment: (NOTE) SARS-CoV-2 target nucleic acids are NOT DETECTED.  The SARS-CoV-2 RNA is generally detectable in upper respiratory specimens during the acute phase of infection. The lowest concentration of SARS-CoV-2 viral copies this assay can detect is 138 copies/mL. A negative result does not preclude SARS-Cov-2 infection and should not be used as the sole basis for treatment or other patient management decisions. A negative result may occur with  improper specimen collection/handling, submission of specimen other than nasopharyngeal swab, presence of viral mutation(s) within the areas targeted by this assay, and inadequate number of viral copies(<138 copies/mL). A negative result must be combined with clinical observations, patient history, and epidemiological information. The expected result is Negative.  Fact Sheet for Patients:  EntrepreneurPulse.com.au  Fact Sheet for Healthcare Providers:  IncredibleEmployment.be  This test is no t yet approved or cleared by the [place] FDA and  has been authorized for detection and/or diagnosis of SARS-CoV-2 by FDA under an Emergency Use Authorization (EUA). This EUA will remain  in effect (meaning this test  can be used) for the duration of the COVID-19 declaration under Section 564(b)(1) of the Act, 21 U.S.C.section 360bbb-3(b)(1), unless the authorization is terminated  or revoked sooner.       Influenza A by PCR NEGATIVE NEGATIVE Final   Influenza B by PCR NEGATIVE NEGATIVE Final    Comment: (NOTE) The Xpert Xpress SARS-CoV-2/FLU/RSV plus assay is intended as an aid in the diagnosis of influenza from Nasopharyngeal swab specimens and should not be used as a sole basis for treatment. Nasal washings and aspirates are unacceptable for Xpert Xpress SARS-CoV-2/FLU/RSV testing.  Fact Sheet for  Patients: BloggerCourse.com  Fact Sheet for Healthcare Providers: SeriousBroker.it  This test is not yet approved or cleared by the [place] FDA and has been authorized for detection and/or diagnosis of SARS-CoV-2 by FDA under an Emergency Use Authorization (EUA). This EUA will remain in effect (meaning this test can be used) for the duration of the COVID-19 declaration under Section 564(b)(1) of the Act, 21 U.S.C. section 360bbb-3(b)(1), unless the authorization is terminated or revoked.  Performed at [facility], [address], [place], [place] [address]          Radiology Studies: DG Chest 2 View  Result Date: 01/11/2021 CLINICAL DATA:  Altered mental status for 2 weeks, progressively worsening EXAM: CHEST - 2 VIEW COMPARISON:  12/04/2020 FINDINGS: Upper normal heart size with normal pulmonary vascularity. Chronic enlargement of RIGHT hilum. Postsurgical changes RIGHT upper lobe with scarring. No definite infiltrate, pleural effusion, or pneumothorax. Osseous structures demineralized. IMPRESSION: Postsurgical changes and scarring in RIGHT upper lobe. Chronic prominence of RIGHT hilum, unchanged, with no hilar mass/adenopathy identified on recent CT. No new abnormalities. Electronically Signed   By: Ulyses Southward M.D.   On: 01/11/2021 12:08   CT HEAD WO CONTRAST ( )  Result Date: 01/11/2021 CLINICAL DATA:  Mental status change EXAM: CT HEAD WITHOUT CONTRAST TECHNIQUE: Contiguous axial images were obtained from the base of the skull through the vertex without intravenous contrast. COMPARISON:  Head CT 12/04/2020 FINDINGS: Brain: No evidence of acute infarction, hemorrhage, hydrocephalus, extra-axial collection or mass lesion/mass effect. Vascular: Negative for hyperdense vessel Skull: Negative Sinuses/Orbits: Paranasal sinuses clear.  Negative orbit Other: None IMPRESSION: Negative CT head Electronically Signed   By:  Marlan Palau M.D.   On: 01/11/2021 13:59   CT ABDOMEN PELVIS W CONTRAST  Result Date: 01/11/2021 CLINICAL DATA:  Confusion history of surgery 1 month ago EXAM: CT ABDOMEN AND PELVIS WITH CONTRAST TECHNIQUE: Multidetector CT imaging of the abdomen and pelvis was performed using the standard protocol following bolus administration of intravenous contrast. CONTRAST:  OMNIPAQUE IOHEXOL 300 MG/ML  SOLN COMPARISON:  CT 11/26/2018 FINDINGS: Lower chest: Lung bases demonstrate no acute consolidation or effusion. Normal cardiac size. Hepatobiliary: Heterogeneous hepatic steatosis. Status post cholecystectomy. No biliary dilatation Pancreas: Unremarkable. No pancreatic ductal dilatation or surrounding inflammatory changes. Spleen: Normal in size without focal abnormality. Adrenals/Urinary Tract: Adrenal glands are normal. Kidneys show no hydronephrosis. Punctate stone in the mid left kidney. The bladder is normal Stomach/Bowel: Interval postsurgical changes of the stomach consistent with gastric bypass. No dilated small bowel to suggest an obstruction. There is no acute bowel wall thickening. The appendix is negative. Vascular/Lymphatic: Nonaneurysmal aorta.  No suspicious lymph nodes Reproductive: Status post hysterectomy. No adnexal masses. Other: Negative for pelvic effusion or free air. Linear density with surrounding soft tissue stranding in the right abdominal subcutaneous fat, extending to the cutaneous surface and right rectus presumably related to history of recent  surgery. Heterogeneous mass measuring 6.9 by 5.1 cm within the left anterior abdominal mesentery, contains fluid level and internal fat density. Multiple subcutaneous nodules within the posterior flank regions, measuring up to 32 mm. Some are fluid density and some are more hyperdense. Musculoskeletal: No acute osseous abnormality IMPRESSION: 1. Interval postsurgical changes of the stomach and small bowel consistent with gastric bypass  surgery. Linear tract in the right anterior abdominal wall with surrounding soft tissue stranding most likely due to postsurgical change/possible prior laparoscopic port placement. Heterogeneous left anterior abdominal mass containing fat and fluid density, also likely related to history of recent surgery and could represent an area of fat necrosis. There is no evidence for a bowel obstruction. 2. Hepatic steatosis 3. Punctate nonobstructing left kidney stone 4. Multiple subcutaneous nodules or possible cysts within the subcutaneous fat of the posterior flank regions Electronically Signed   By: Donavan Foil M.D.   On: 01/11/2021 15:36        Scheduled Meds:  heparin  5,000 Units Subcutaneous Q8H   levothyroxine  175 mcg Intravenous Daily   lisinopril  10 mg Oral Daily   pantoprazole  40 mg Oral BID   simvastatin  20 mg Oral QHS   sucralfate  1 g Oral TID WC & HS   Continuous Infusions:  potassium chloride       LOS: 1 day    Time spent: 32 minutes    Sharen Hones, MD [facility]   To contact the attending provider between 7A-7P or the covering provider during after hours 7P-7A, please log into the web site [email or web address] and access using universal [place] password for that web site. If you do not have the password, please call the hospital operator.  01/12/2021, 9:55 AM

## 2021-01-12 NOTE — Progress Notes (Signed)
PHARMACIST - PHYSICIAN COMMUNICATION  CONCERNING:  Enoxaparin (Lovenox) for DVT Prophylaxis    RECOMMENDATION: Patient was prescribed enoxaprin 40mg  q24 hours for VTE prophylaxis.   Filed Weights   01/11/21 1130  Weight: 117.9 kg (260 lb)    Body mass index is 47.55 kg/m.  Estimated Creatinine Clearance: 100.3 mL/min (by C-G formula based on SCr of 0.51 mg/dL).   Based on [facility] policy patient is candidate for enoxaparin 0.5mg /kg TBW SQ every 24 hours based on BMI being >30.  DESCRIPTION: Pharmacy has adjusted enoxaparin dose per [facility] policy.  Patient is now receiving enoxaparin 60 mg every 24 hours    [facility], PharmD Clinical Pharmacist  01/12/2021 10:08 AM

## 2021-01-13 DIAGNOSIS — F250 Schizoaffective disorder, bipolar type: Secondary | ICD-10-CM

## 2021-01-13 DIAGNOSIS — F1193 Opioid use, unspecified with withdrawal: Secondary | ICD-10-CM

## 2021-01-13 LAB — TSH: TSH: 23.839 u[IU]/mL — ABNORMAL HIGH (ref 0.350–4.500)

## 2021-01-13 LAB — GLUCOSE, CAPILLARY: Glucose-Capillary: 119 mg/dL — ABNORMAL HIGH (ref 70–99)

## 2021-01-13 LAB — BASIC METABOLIC PANEL
Anion gap: 6 (ref 5–15)
BUN: 9 mg/dL (ref 6–20)
CO2: 25 mmol/L (ref 22–32)
Calcium: 8.7 mg/dL — ABNORMAL LOW (ref 8.9–10.3)
Chloride: 108 mmol/L (ref 98–111)
Creatinine, Ser: 0.49 mg/dL (ref 0.44–1.00)
GFR, Estimated: >60 mL/min (ref >60)
Glucose, Bld: 128 mg/dL — ABNORMAL HIGH (ref 70–99)
Potassium: 3.3 mmol/L — ABNORMAL LOW (ref 3.5–5.1)
Sodium: 139 mmol/L (ref 135–145)

## 2021-01-13 LAB — PHOSPHORUS: Phosphorus: 3.2 mg/dL (ref 2.5–4.6)

## 2021-01-13 LAB — MAGNESIUM: Magnesium: 2.1 mg/dL (ref 1.7–2.4)

## 2021-01-13 LAB — T4, FREE: Free T4: 1.23 ng/dL — ABNORMAL HIGH (ref 0.61–1.12)

## 2021-01-13 MED ORDER — LEVOTHYROXINE SODIUM 100 MCG PO TABS
350.0000 ug | ORAL_TABLET | Freq: Every day | ORAL | Status: DC
Start: 2021-01-14 — End: 2021-01-15
  Administered 2021-01-14 – 2021-01-15 (×2): 350 ug via ORAL
  Filled 2021-01-13 (×2): qty 1

## 2021-01-13 MED ORDER — OXYCODONE HCL 20 MG/ML PO CONC
5.0000 mg | Freq: Four times a day (QID) | ORAL | Status: DC | PRN
  Filled 2021-01-13: qty 1

## 2021-01-13 MED ORDER — CLONAZEPAM 0.5 MG PO TABS
0.5000 mg | ORAL_TABLET | Freq: Every day | ORAL | Status: DC | PRN
  Administered 2021-01-15: 05:00:00 0.5 mg via ORAL
  Filled 2021-01-13: qty 1

## 2021-01-13 MED ORDER — HYDROXYZINE HCL 10 MG PO TABS
10.0000 mg | ORAL_TABLET | Freq: Three times a day (TID) | ORAL | Status: DC | PRN
  Filled 2021-01-13: qty 1

## 2021-01-13 MED ORDER — QUETIAPINE FUMARATE 300 MG PO TABS
600.0000 mg | ORAL_TABLET | Freq: Every day | ORAL | Status: DC
  Administered 2021-01-13 – 2021-01-14 (×2): 600 mg via ORAL
  Filled 2021-01-13 (×3): qty 2

## 2021-01-13 MED ORDER — ENOXAPARIN SODIUM 80 MG/0.8ML IJ SOSY
0.5000 mg/kg | PREFILLED_SYRINGE | INTRAMUSCULAR | Status: DC
  Administered 2021-01-13 – 2021-01-14 (×2): 67.5 mg via SUBCUTANEOUS
  Filled 2021-01-13 (×3): qty 0.68

## 2021-01-13 MED ORDER — OXYCODONE HCL 5 MG/5ML PO SOLN
5.0000 mg | Freq: Four times a day (QID) | ORAL | Status: DC | PRN
  Administered 2021-01-13 – 2021-01-14 (×3): 5 mg via ORAL
  Filled 2021-01-13 (×3): qty 5

## 2021-01-13 MED ORDER — POTASSIUM CHLORIDE CRYS ER 20 MEQ PO TBCR
40.0000 meq | EXTENDED_RELEASE_TABLET | Freq: Once | ORAL | Status: AC
  Administered 2021-01-13: 08:00:00 40 meq via ORAL
  Filled 2021-01-13: qty 2

## 2021-01-13 NOTE — Progress Notes (Signed)
Progress Note    Rhonda Palmer   KNL:[id number]  DOB: Jun 09, 1970  DOA: 01/11/2021     2 Date of Service: 01/13/2021   Clinical Course  Rhonda Palmer is a 50 y.o. 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 CT scan showed postsurgical changes, left anterior abdominal mass, containing fat and a fluid density, could represent an area of fat necrosis.  12/18: Tremulous, dilated pupils and other symptoms which includes nausea, diarrhea, abdominal pain worrisome for opiate withdrawal.  We will restart oxycodone and monitor   Assessment and Plan * Opioid withdrawal (HCC) Her signs and symptoms are concerning for same.  She was taking oxycodone 5 mg/5 ml (100 ml) every 6 hours as needed started on 11/21 per PMP database.  This was prescribed only for 4 days per database.  Although patient claims she took it for almost 7 to 10 days.  Her current symptoms including nausea abdominal pain and diarrhea started about a week to 10 days ago which aligns with opioid withdrawal.  She is having extreme nausea, diarrhea, abdominal pain, she was very tremulous, jittery and her pupils are very dilated.  We will restart her on oxycodone and consult psychiatry  Diarrhea Stool for C. difficile negative.  This could be from opioid withdrawal  Prolonged QT interval Avoid drugs causing QT prolongation.  Placed on telemetry monitor  Obesity, Class III, BMI 40-49.9 (morbid obesity) (HCC) Status post gastric bypass at [facility] 3 weeks ago  Abdominal pain Likely from recent surgery and or opioid withdrawal  Acute metabolic encephalopathy She seem to be withdrawing from  opioid.  Monitor  Depression with anxiety Continue Klonopin.  Resume Seroquel.  Consult psychiatry  Bipolar 1 disorder (HCC) Resume Seroquel today.  Consult psychiatry  Hypokalemia Replete and recheck  Hypothyroidism Continue levothyroxine.  Patient was not taking this at home.  Her TSH on admission was 47.  She was on IV levothyroxine which will be changed to p.o. today as she is taking oral   Subjective:  complains of nausea, abdominal pain and diarrhea  Objective Vitals:   01/13/21 0529 01/13/21 0823 01/13/21 0902 01/13/21 1248  BP: (!) 145/88 (!) 149/87 120/89 (!) 141/92  Pulse: 70 76 73 76  Resp: 16 (!) 26 (!) 24 (!) 24  Temp: 98.2 F (36.8 C) 98.5 F (36.9 C) 98.5 F (36.9 C) 98.4 F (36.9 C)  TempSrc: Oral Oral Oral Oral  SpO2: 95% 95% 95% 97%  Weight:      Height:       (!) 137.4 kg  Vital signs were reviewed and unremarkable except for: Blood pressure: High    Exam Physical Exam   General exam: Appears tremulous, morbid obese. Eyes: Dilated pupils bilaterally Respiratory system: Clear to auscultation. Respiratory effort normal. Cardiovascular system: S1 & S2 heard, RRR. No JVD, murmurs, rubs, gallops or clicks. No pedal edema. Gastrointestinal system: Abdomen is nondistended, soft and RUQ tender, negative Murphy sign.  No organomegaly or masses felt. Normal bowel sounds heard.  Healing surgical wound Central nervous system:  Tremulous all over her body.  Awake and alert, nonfocal exam Extremities: Symmetric 5 x 5 power. Skin: No rashes, lesions or ulcers Psychiatry:  Seems jittery  Labs / Other Information My review of labs, imaging,  notes and other tests is significant for Hypokalemia     Disposition Plan: Status is: Inpatient  Remains inpatient appropriate because: Opioid withdrawal, pending psychiatry consult   Low threshold in transferring to ICU/stepdown if she clinically gets worse     Time spent: 35 minutes [facility] 01/13/2021, 1:55 PM

## 2021-01-13 NOTE — Assessment & Plan Note (Signed)
Avoid drugs causing QT prolongation.  Placed on telemetry monitor

## 2021-01-13 NOTE — Assessment & Plan Note (Signed)
Resume Seroquel today.  Consult psychiatry

## 2021-01-13 NOTE — Assessment & Plan Note (Signed)
Replete and recheck ?

## 2021-01-13 NOTE — Assessment & Plan Note (Addendum)
Her signs and symptoms are concerning for same.  She was taking oxycodone 5 mg/5 ml (100 ml) every 6 hours as needed started on 11/21 per PMP database.  This was prescribed only for 4 days per database.  Although patient claims she took it for almost 7 to 10 days.  Her current symptoms including nausea abdominal pain and diarrhea started about a week to 10 days ago which aligns with opioid withdrawal.  She is having extreme nausea, diarrhea, abdominal pain, she was very tremulous, jittery and her pupils are very dilated.  We will restart her on oxycodone and consult psychiatry

## 2021-01-13 NOTE — Progress Notes (Addendum)
[facility] Hospital Day(s): 2.   Interval History: Patient seen and examined, no acute events or new complaints overnight. Patient reports that she is starting to feel a little more like herself with resolution of her confusion.  She was eating her breakfast this morning.  She continues to complain of nausea, small amounts of emesis, and diarrhea.  She describes her stool as slightly formed stool.  She confirms flatus with bowel movements.  She complains of tenderness along her incision sites.  Review of Systems:  Constitutional: denies fever, chills  HEENT: denies cough or congestion  Respiratory: denies any shortness of breath  Cardiovascular: denies chest pain or palpitations  Gastrointestinal: confirms nausea, emesis, abdominal pain, diarrhea Genitourinary: denies burning with urination or urinary frequency Musculoskeletal: denies pain, decreased motor or sensation Integumentary: denies any other rashes or skin discolorations Vital signs in last 24 hours: [min-max] current  Temp:  [98.2 F (36.8 C)-99.1 F (37.3 C)] 98.2 F (36.8 C) (12/18 0529) Pulse Rate:  [70-77] 70 (12/18 0529) Resp:  [16-20] 16 (12/18 0529) BP: (140-155)/(88-115) 145/88 (12/18 0529) SpO2:  [93 %-97 %] 95 % (12/18 0529) Weight:  [137.4 kg] 137.4 kg (12/18 0516)     Height: 5\' 2"  (157.5 cm) Weight: (!) 137.4 kg BMI (Calculated): 55.39   Intake/Output last 2 shifts:  12/17 0701 - 12/18 0700 In: 251.8 [IV Piggyback:251.8] Out: -    Physical Exam:  Constitutional: alert, cooperative and no distress  HENT: normocephalic without obvious abnormality  Respiratory: breathing non-labored at rest  Cardiovascular: regular rate and sinus rhythm  Gastrointestinal: soft, non-tender, and non-distended, no rigidity, guarding or rebound tenderness Integumentary: Laparoscopic incisions healing well   Labs:  CBC Latest Ref Rng & Units 01/11/2021 12/05/2020 12/04/2020  WBC 4.0 -  10.5 K/uL 9.4 9.0 9.5  Hemoglobin 12.0 - 15.0 g/dL 13.7 13.0 13.3  Hematocrit 36.0 - 46.0 % 45.0 44.4 44.5  Platelets 150 - 400 K/uL 321 290 274   CMP Latest Ref Rng & Units 01/13/2021 01/12/2021 01/11/2021  Glucose 70 - 99 mg/dL 128(H) 129(H) 145(H)  BUN 6 - 20 mg/dL 9 10 10   Creatinine 0.44 - 1.00 mg/dL 0.49 0.51 0.58  Sodium 135 - 145 mmol/L 139 142 142  Potassium 3.5 - 5.1 mmol/L 3.3(L) 2.8(L) 2.7(LL)  Chloride 98 - 111 mmol/L 108 106 105  CO2 22 - 32 mmol/L 25 28 28   Calcium 8.9 - 10.3 mg/dL 8.7(L) 8.6(L) 8.9  Total Protein 6.5 - 8.1 g/dL - - 7.5  Total Bilirubin 0.3 - 1.2 mg/dL - - 0.8  Alkaline Phos 38 - 126 U/L - - 136(H)  AST 15 - 41 U/L - - 30  ALT 0 - 44 U/L - - 32    Imaging studies: No new pertinent imaging studies   Assessment/Plan: 50 y.o. female who was admitted for altered mentation, hypothyroidism, and abdominal pain, s/p gastric bypass surgery at [facility] 1 month ago.  CT A/P demonstrates anterior abdominal wall mass containing fat, likely fat necrosis   -Suspect abdominal wall mass is fat necrosis secondary to devitalized omentum from her recent surgery  -No concerns for infection of this area  -No acute surgical intervention at this time, recommend patient follow-up outpatient with her surgeon  -Regular diet - advised patient to eat small meals as eating large volumes of food could provoke nausea given her recent gastric bypass  -Primary team concerned for possible opioid withdrawal causing the patient's nausea, abdominal pain, tremulousness  -  Pain medications reordered  -Psych consult ordered    All of the above findings and recommendations were discussed with the patient, and the medical team, and all of patient's questions were answered to her expressed satisfaction.  -- Theophilus Kinds, DO

## 2021-01-13 NOTE — Assessment & Plan Note (Signed)
Continue levothyroxine.  Patient was not taking this at home.  Her TSH on admission was 47.  She was on IV levothyroxine which will be changed to p.o. today as she is taking oral

## 2021-01-13 NOTE — Consult Note (Signed)
[facility] Face-to-Face Psychiatry Consult   Reason for Consult:  Medication review and opiate use Referring Physician:  EDP Patient Identification: Rhonda Palmer MRN:  [id number] Principal Diagnosis: Opioid withdrawal (HCC) Diagnosis:  Principal Problem:   Opioid withdrawal (HCC) Active Problems:   Schizoaffective disorder, bipolar type (HCC)   Hypothyroidism   Hypokalemia   Asthma   Bipolar 1 disorder (HCC)   Depression with anxiety   Hypertension   HLD (hyperlipidemia)   Acute metabolic encephalopathy   Abdominal pain   Obesity, Class III, BMI 40-49.9 (morbid obesity) (HCC)   Prolonged QT interval   Diarrhea   Total Time spent with patient: 1 hour  Subjective:   Rhonda Palmer is a 50 y.o. female patient admitted with AMS and abdominal pain.  "I feel better, cognition is better.  My mood is Ok."  HPI:  50 yo female with many medical comorbidities and schizoaffective disorder, bipolar type.  Her depression is low, anxiety is high with panic attacks at times.  Denies suicidal/homicidal ideations, hallucinations, and alcohol abuse.  She reports feeling nauseous since her gastric bypass surgery a few weeks ago, "I've never been so sick in my life."  She is stabilized on Aristada injections for her mental health issues along with Seroquel.  On 10/4 she was prescribed Klonopin 0.5 mg for 7 days TID, 21 tablets along with Ambien for 7 days, 7 tablets.  She filled it on 12/2 and has been taking these medications with gabapentin (recently started), Seroquel, and tizandine.  She was prescribed oxycodone 5 mg for 4 days after her surgery and reports not having it since then.  Her TSH is 81 which is also contributing to her cognition issues.  She does report taking her synthroid in the am prior to her other medications.  This may have been omitted during her AMS.  Discussed her medications and avoiding Klonopin, gabapentin, Ambien along with opiates.  She has been on Seroquel for "years", no recent  changes.  The client lives with her 1 yo daughter who is supportive and the "light of my life."  Separated and her husband is "my best friend".  She does receive therapy and has not psychiatric concerns.  Past Psychiatric History: schizoaffective disorder, bipolar type  Risk to Self:  none Risk to Others:  none Prior Inpatient Therapy:  past Prior Outpatient Therapy:  Dr Newman Pies  Past Medical History:  Past Medical History:  Diagnosis Date   (HFpEF) heart failure with preserved ejection fraction (HCC)    a. 02/2020 Echo: EF 55-60%, no rwma. Nl RV fxn.   Anxiety    Asthma    Bipolar 1 disorder (HCC)    Diabetes mellitus without complication (HCC)    Edema leg for twenty years per patient   taking lasix for edema   Fibromyalgia    History of cardiac catheterization    a. 02/2020 Cath: Nl cors. LVEDP -> torsemide increased to 40mg  daily.   Hypertension    Hypothyroidism    Morbid obesity (HCC)    Ovarian cyst    Palpitations    a. 03/2020 Zio: RSR, avg HR 84 (55-203), occas SVT runs. No significant arrhythmias.  Most triggered events associate with sinus rhythm.   Schizo affective schizophrenia [facility])     Past Surgical History:  Procedure Laterality Date   ABDOMINAL HYSTERECTOMY     ANKLE SURGERY     pt. states long time ago   LEFT HEART CATH AND CORONARY ANGIOGRAPHY N/A 03/13/2020  Procedure: LEFT HEART CATH AND CORONARY ANGIOGRAPHY;  Surgeon: Yvonne Kendall, MD;  Location: [facility] INVASIVE CV LAB;  Service: Cardiovascular;  Laterality: N/A;   Family History:  Family History  Problem Relation Age of Onset   Heart failure Mother    Bladder Cancer Neg Hx    Kidney cancer Neg Hx    Family Psychiatric  History: none Social History:  Social History   Substance and Sexual Activity  Alcohol Use No     Social History   Substance and Sexual Activity  Drug Use No    Social History   Socioeconomic History   Marital status: Married    Spouse name: Not on file   Number  of children: Not on file   Years of education: Not on file   Highest education level: Not on file  Occupational History   Not on file  Tobacco Use   Smoking status: Never   Smokeless tobacco: Never  Vaping Use   Vaping Use: Never used  Substance and Sexual Activity   Alcohol use: No   Drug use: No   Sexual activity: Not on file  Other Topics Concern   Not on file  Social History Narrative   Not on file   Social Determinants of Health   Financial Resource Strain: Not on file  Food Insecurity: Not on file  Transportation Needs: Not on file  Physical Activity: Not on file  Stress: Not on file  Social Connections: Not on file   Additional Social History:    Allergies:   Allergies  Allergen Reactions   Codeine Hives, Itching, Rash and Swelling   Sulfa Antibiotics Anaphylaxis   Penicillins Itching    Labs:  Results for orders placed or performed during the hospital encounter of 01/11/21 (from the past 48 hour(s))  Resp Panel by RT-PCR (Flu A&B, Covid) Nasopharyngeal Swab     Status: None   Collection Time: 01/11/21  4:08 PM   Specimen: Nasopharyngeal Swab; Nasopharyngeal(NP) swabs in vial transport medium  Result Value Ref Range   SARS Coronavirus 2 by RT PCR NEGATIVE NEGATIVE    Comment: (NOTE) SARS-CoV-2 target nucleic acids are NOT DETECTED.  The SARS-CoV-2 RNA is generally detectable in upper respiratory specimens during the acute phase of infection. The lowest concentration of SARS-CoV-2 viral copies this assay can detect is 138 copies/mL. A negative result does not preclude SARS-Cov-2 infection and should not be used as the sole basis for treatment or other patient management decisions. A negative result may occur with  improper specimen collection/handling, submission of specimen other than nasopharyngeal swab, presence of viral mutation(s) within the areas targeted by this assay, and inadequate number of viral copies(<138 copies/mL). A negative result must  be combined with clinical observations, patient history, and epidemiological information. The expected result is Negative.  Fact Sheet for Patients:  BloggerCourse.com  Fact Sheet for Healthcare Providers:  SeriousBroker.it  This test is no t yet approved or cleared by the [place] FDA and  has been authorized for detection and/or diagnosis of SARS-CoV-2 by FDA under an Emergency Use Authorization (EUA). This EUA will remain  in effect (meaning this test can be used) for the duration of the COVID-19 declaration under Section 564(b)(1) of the Act, 21 U.S.C.section 360bbb-3(b)(1), unless the authorization is terminated  or revoked sooner.       Influenza A by PCR NEGATIVE NEGATIVE   Influenza B by PCR NEGATIVE NEGATIVE    Comment: (NOTE) The Xpert Xpress SARS-CoV-2/FLU/RSV plus assay is  intended as an aid in the diagnosis of influenza from Nasopharyngeal swab specimens and should not be used as a sole basis for treatment. Nasal washings and aspirates are unacceptable for Xpert Xpress SARS-CoV-2/FLU/RSV testing.  Fact Sheet for Patients: BloggerCourse.com  Fact Sheet for Healthcare Providers: SeriousBroker.it  This test is not yet approved or cleared by the [place] FDA and has been authorized for detection and/or diagnosis of SARS-CoV-2 by FDA under an Emergency Use Authorization (EUA). This EUA will remain in effect (meaning this test can be used) for the duration of the COVID-19 declaration under Section 564(b)(1) of the Act, 21 U.S.C. section 360bbb-3(b)(1), unless the authorization is terminated or revoked.  Performed at [facility], [address] [place], [place] [address]   Basic metabolic panel     Status: Abnormal   Collection Time: 01/12/21  5:04 AM  Result Value Ref Range   Sodium 142 135 - 145 mmol/L   Potassium 2.8 (L) 3.5 - 5.1 mmol/L    Chloride 106 98 - 111 mmol/L   CO2 28 22 - 32 mmol/L   Glucose, Bld 129 (H) 70 - 99 mg/dL    Comment: Glucose reference range applies only to samples taken after fasting for at least 8 hours.   BUN 10 6 - 20 mg/dL   Creatinine, Ser 6.04 0.44 - 1.00 mg/dL   Calcium 8.6 (L) 8.9 - 10.3 mg/dL   GFR, Estimated >54 >09 mL/min    Comment: (NOTE) Calculated using the CKD-EPI Creatinine Equation (2021)    Anion gap 8 5 - 15    Comment: Performed at [facility], [address] [place], [place] [address]  Glucose, capillary     Status: Abnormal   Collection Time: 01/12/21  9:05 AM  Result Value Ref Range   Glucose-Capillary 133 (H) 70 - 99 mg/dL    Comment: Glucose reference range applies only to samples taken after fasting for at least 8 hours.  C Difficile Quick Screen w PCR reflex     Status: None   Collection Time: 01/12/21 12:00 PM   Specimen: STOOL  Result Value Ref Range   C Diff antigen NEGATIVE NEGATIVE   C Diff toxin NEGATIVE NEGATIVE   C Diff interpretation No C. difficile detected.     Comment: Performed at [facility], [address] [place], [place] [address]  Gastrointestinal Panel by PCR , Stool     Status: None   Collection Time: 01/12/21 12:00 PM   Specimen: Stool  Result Value Ref Range   Campylobacter species NOT DETECTED NOT DETECTED   Plesimonas shigelloides NOT DETECTED NOT DETECTED   Salmonella species NOT DETECTED NOT DETECTED   Yersinia enterocolitica NOT DETECTED NOT DETECTED   Vibrio species NOT DETECTED NOT DETECTED   Vibrio cholerae NOT DETECTED NOT DETECTED   Enteroaggregative E coli (EAEC) NOT DETECTED NOT DETECTED   Enteropathogenic E coli (EPEC) NOT DETECTED NOT DETECTED   Enterotoxigenic E coli (ETEC) NOT DETECTED NOT DETECTED   Shiga like toxin producing E coli (STEC) NOT DETECTED NOT DETECTED   Shigella/Enteroinvasive E coli (EIEC) NOT DETECTED NOT DETECTED   Cryptosporidium NOT DETECTED NOT DETECTED   Cyclospora cayetanensis  NOT DETECTED NOT DETECTED   Entamoeba histolytica NOT DETECTED NOT DETECTED   Giardia lamblia NOT DETECTED NOT DETECTED   Adenovirus F40/41 NOT DETECTED NOT DETECTED   Astrovirus NOT DETECTED NOT DETECTED   Norovirus GI/GII NOT DETECTED NOT DETECTED   Rotavirus A NOT DETECTED NOT DETECTED  Sapovirus (I, II, IV, and V) NOT DETECTED NOT DETECTED    Comment: Performed at [facility], [address] [place], [place] [address]  Basic metabolic panel     Status: Abnormal   Collection Time: 01/13/21  5:24 AM  Result Value Ref Range   Sodium 139 135 - 145 mmol/L   Potassium 3.3 (L) 3.5 - 5.1 mmol/L   Chloride 108 98 - 111 mmol/L   CO2 25 22 - 32 mmol/L   Glucose, Bld 128 (H) 70 - 99 mg/dL    Comment: Glucose reference range applies only to samples taken after fasting for at least 8 hours.   BUN 9 6 - 20 mg/dL   Creatinine, Ser 6.04 0.44 - 1.00 mg/dL   Calcium 8.7 (L) 8.9 - 10.3 mg/dL   GFR, Estimated >54 >09 mL/min    Comment: (NOTE) Calculated using the CKD-EPI Creatinine Equation (2021)    Anion gap 6 5 - 15    Comment: Performed at [facility], [address] [place], [place] [address]  Magnesium     Status: None   Collection Time: 01/13/21  5:24 AM  Result Value Ref Range   Magnesium 2.1 1.7 - 2.4 mg/dL    Comment: Performed at [facility], [address] [place], [place] [address]  Phosphorus     Status: None   Collection Time: 01/13/21  5:24 AM  Result Value Ref Range   Phosphorus 3.2 2.5 - 4.6 mg/dL    Comment: Performed at [facility], [address] [place], [place] [address]  TSH     Status: Abnormal   Collection Time: 01/13/21  5:24 AM  Result Value Ref Range   TSH 23.839 (H) 0.350 - 4.500 uIU/mL    Comment: Performed by a 3rd Generation assay with a functional sensitivity of <=0.01 uIU/mL. Performed at [facility], [address] [place], [place] [address]   T4, free     Status: Abnormal   Collection Time:  01/13/21  5:24 AM  Result Value Ref Range   Free T4 1.23 (H) 0.61 - 1.12 ng/dL    Comment: (NOTE) Biotin ingestion may interfere with free T4 tests. If the results are inconsistent with the TSH level, previous test results, or the clinical presentation, then consider biotin interference. If needed, order repeat testing after stopping biotin. Performed at [facility], [address] [place], [place] [address]   Glucose, capillary     Status: Abnormal   Collection Time: 01/13/21  9:37 AM  Result Value Ref Range   Glucose-Capillary 119 (H) 70 - 99 mg/dL    Comment: Glucose reference range applies only to samples taken after fasting for at least 8 hours.    Current Facility-Administered Medications  Medication Dose Route Frequency Provider Last Rate Last Admin   acetaminophen (TYLENOL) tablet 650 mg  650 mg Oral Q6H PRN Lorretta Harp, MD       albuterol (PROVENTIL) (2.5 MG/3ML) 0.083% nebulizer solution 2.5 mg  2.5 mg Nebulization Q4H PRN Lorretta Harp, MD       clonazePAM Scarlette Calico) tablet 0.5 mg  0.5 mg Oral Daily PRN Charm Rings, NP       diphenhydrAMINE (BENADRYL) injection 12.5 mg  12.5 mg Intravenous Q8H PRN Lorretta Harp, MD   12.5 mg at 01/12/21 1101   enoxaparin (LOVENOX) injection 67.5 mg  0.5 mg/kg Subcutaneous Q24H Dorothea Ogle B, RPH       hydrALAZINE (APRESOLINE) injection 5 mg  5 mg Intravenous Q2H PRN Lorretta Harp, MD       [START ON 01/14/2021] levothyroxine (SYNTHROID) tablet 350 mcg  350 mcg Oral Q0600 Delfino Lovett, MD       lisinopril (ZESTRIL) tablet 10 mg  10 mg Oral Daily Lorretta Harp, MD   10 mg at 01/13/21 1252   ondansetron (ZOFRAN) injection 4 mg  4 mg Intravenous Q4H PRN Marrion Coy, MD   4 mg at 01/13/21 1251   oxyCODONE (ROXICODONE) 5 MG/5ML solution 5 mg  5 mg Oral Q6H PRN Tressie Ellis, RPH   5 mg at 01/13/21 0857   pantoprazole (PROTONIX) EC tablet 40 mg  40 mg Oral BID Marrion Coy, MD   40 mg at 01/13/21 1253   QUEtiapine (SEROQUEL) tablet 600 mg   600 mg Oral QHS Delfino Lovett, MD       simvastatin (ZOCOR) tablet 20 mg  20 mg Oral QHS Lorretta Harp, MD   20 mg at 01/12/21 2046   sucralfate (CARAFATE) 1 GM/10ML suspension 1 g  1 g Oral TID WC & HS Marrion Coy, MD   1 g at 01/13/21 1254    Musculoskeletal: Strength & Muscle Tone: decreased Gait & Station:  did not witness Patient leans: N/A  Psychiatric Specialty Exam: Physical Exam Vitals and nursing note reviewed.  Constitutional:      Appearance: Normal appearance.  HENT:     Head: Normocephalic.     Nose: Nose normal.  Pulmonary:     Effort: Pulmonary effort is normal.  Musculoskeletal:     Cervical back: Normal range of motion.  Neurological:     General: No focal deficit present.     Mental Status: She is alert and oriented to person, place, and time.  Psychiatric:        Attention and Perception: Attention and perception normal.        Mood and Affect: Mood is anxious and depressed.        Speech: Speech normal.        Behavior: Behavior normal. Behavior is cooperative.        Thought Content: Thought content normal.        Cognition and Memory: Cognition and memory normal.        Judgment: Judgment normal.    Review of Systems  Constitutional:  Positive for malaise/fatigue.  Gastrointestinal:  Positive for abdominal pain and nausea.  Psychiatric/Behavioral:  Positive for depression. The patient is nervous/anxious.   All other systems reviewed and are negative.  Blood pressure (!) 141/92, pulse 76, temperature 98.4 F (36.9 C), temperature source Oral, resp. rate (!) 24, height  (1.575 m), weight (!) 137.4 kg, SpO2 97 %.Body mass index is 55.4 kg/m.  General Appearance: Casual  Eye Contact:  Good  Speech:  Normal Rate  Volume:  Normal  Mood:  Anxious and Depressed  Affect:  Congruent  Thought Process:  Coherent and Descriptions of Associations: Intact  Orientation:  Full (Time, Place, and Person)  Thought Content:  WDL and Logical  Suicidal Thoughts:   No  Homicidal Thoughts:  No  Memory:  Immediate;   Good Recent;   Good Remote;   Good  Judgement:  Fair  Insight:  Fair  Psychomotor Activity:  Decreased  Concentration:  Concentration: Fair and Attention Span: Fair  Recall:  Good  Fund of Knowledge:  Good  Language:  Good  Akathisia:  No  Handed:  Right  AIMS (if indicated):     Assets:  Housing Leisure Time Resilience Social Support  ADL's:  Intact  Cognition:  WNL  Sleep:        Physical Exam: Physical Exam Vitals and nursing note reviewed.  Constitutional:      Appearance: Normal appearance.  HENT:     Head: Normocephalic.     Nose: Nose normal.  Pulmonary:     Effort: Pulmonary effort is normal.  Musculoskeletal:     Cervical back: Normal range of motion.  Neurological:     General: No focal deficit present.     Mental Status: She is alert and oriented to person, place, and time.  Psychiatric:        Attention and Perception: Attention and perception normal.        Mood and Affect: Mood is anxious and depressed.        Speech: Speech normal.        Behavior: Behavior normal. Behavior is cooperative.        Thought Content: Thought content normal.        Cognition and Memory: Cognition and memory normal.        Judgment: Judgment normal.   Review of Systems  Constitutional:  Positive for malaise/fatigue.  Gastrointestinal:  Positive for abdominal pain and nausea.  Psychiatric/Behavioral:  Positive for depression. The patient is nervous/anxious.   All other systems reviewed and are negative. Blood pressure (!) 141/92, pulse 76, temperature 98.4 F (36.9 C), temperature source Oral, resp. rate (!) 24, height 5\' 2"  (1.575 m), weight (!) 137.4 kg, SpO2 97 %. Body mass index is 55.4 kg/m.  Treatment Plan Summary: Client with long history of schizoaffective disorder, bipolar type stabilized on Aristada and Seroquel.  She recently had gastri bypass and had opiate medications along with an old prescription  recently filled for Klonopin and Ambien.  Recently started gabapentin and a muscle relaxer.  These medications in combination with her TSH or 47 most likely contributed to her AMS prior to admission.  Educated the client on this issue and refraining from using any psych medications except the following:  Schizoaffective disorder, bipolar type: -Continue Aristada injections with psychiatrist along with therapy -Continue Seroquel 600 mg at bedtime  Anxiety: Decreased Klonopin 0.5 mg TID PRN to daily PRN with the goal to stop, then consider hydroxyzine 10 mg TID PRN  Avoid opiates, muscle relaxers, gabapentin, Ambien and other sedating medications  Disposition: No evidence of imminent risk to self or others at present.   Patient does not meet criteria for psychiatric inpatient admission. Supportive therapy provided about ongoing stressors.  , NP 01/13/2021 3:03 PM

## 2021-01-13 NOTE — Assessment & Plan Note (Signed)
Likely from recent surgery and or opioid withdrawal

## 2021-01-13 NOTE — Assessment & Plan Note (Signed)
Stool for C. difficile negative.  This could be from opioid withdrawal

## 2021-01-13 NOTE — Hospital Course (Addendum)
Rhonda Palmer is a 50 y.o. 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 CT scan showed postsurgical changes, left anterior abdominal mass, containing fat and a fluid density, could represent an area of fat necrosis.  12/18: Tremulous, dilated pupils and other symptoms which includes nausea, diarrhea, abdominal pain worrisome for opiate withdrawal.  We will restart oxycodone and monitor 12/19: Tolerating diet.  Still nauseous but not tremulous and pupils not dilated today

## 2021-01-13 NOTE — Assessment & Plan Note (Signed)
Status post gastric bypass at [facility] 3 weeks ago

## 2021-01-13 NOTE — Assessment & Plan Note (Signed)
She seem to be withdrawing from opioid.  Monitor

## 2021-01-13 NOTE — Assessment & Plan Note (Signed)
Continue Klonopin.  Resume Seroquel.  Consult psychiatry

## 2021-01-14 DIAGNOSIS — R1084 Generalized abdominal pain: Secondary | ICD-10-CM

## 2021-01-14 DIAGNOSIS — R1909 Other intra-abdominal and pelvic swelling, mass and lump: Secondary | ICD-10-CM

## 2021-01-14 LAB — CBC
HCT: 39.3 % (ref 36.0–46.0)
Hemoglobin: 12.1 g/dL (ref 12.0–15.0)
MCH: 27.9 pg (ref 26.0–34.0)
MCHC: 30.8 g/dL (ref 30.0–36.0)
MCV: 90.6 fL (ref 80.0–100.0)
Platelets: 234 10*3/uL (ref 150–400)
RBC: 4.34 MIL/uL (ref 3.87–5.11)
RDW: 17 % — ABNORMAL HIGH (ref 11.5–15.5)
WBC: 8 10*3/uL (ref 4.0–10.5)
nRBC: 0 % (ref 0.0–0.2)

## 2021-01-14 LAB — BASIC METABOLIC PANEL
Anion gap: 7 (ref 5–15)
BUN: 10 mg/dL (ref 6–20)
CO2: 28 mmol/L (ref 22–32)
Calcium: 8.7 mg/dL — ABNORMAL LOW (ref 8.9–10.3)
Chloride: 108 mmol/L (ref 98–111)
Creatinine, Ser: 0.66 mg/dL (ref 0.44–1.00)
GFR, Estimated: >60 mL/min (ref >60)
Glucose, Bld: 112 mg/dL — ABNORMAL HIGH (ref 70–99)
Potassium: 3 mmol/L — ABNORMAL LOW (ref 3.5–5.1)
Sodium: 143 mmol/L (ref 135–145)

## 2021-01-14 LAB — GLUCOSE, CAPILLARY
Glucose-Capillary: 90 mg/dL (ref 70–99)
Glucose-Capillary: 112 mg/dL — ABNORMAL HIGH (ref 70–99)
Glucose-Capillary: 126 mg/dL — ABNORMAL HIGH (ref 70–99)

## 2021-01-14 LAB — MAGNESIUM: Magnesium: 2.3 mg/dL (ref 1.7–2.4)

## 2021-01-14 MED ORDER — OXYCODONE HCL 5 MG/5ML PO SOLN
5.0000 mg | Freq: Three times a day (TID) | ORAL | Status: DC | PRN
  Administered 2021-01-14 – 2021-01-15 (×3): 5 mg via ORAL
  Filled 2021-01-14 (×3): qty 5

## 2021-01-14 MED ORDER — POTASSIUM CHLORIDE CRYS ER 20 MEQ PO TBCR
40.0000 meq | EXTENDED_RELEASE_TABLET | Freq: Once | ORAL | Status: AC
  Administered 2021-01-14: 11:00:00 40 meq via ORAL
  Filled 2021-01-14: qty 2

## 2021-01-14 NOTE — Assessment & Plan Note (Signed)
Her signs and symptoms are concerning for same.  She was taking oxycodone 5 mg/5 ml (100 ml) every 6 hours as needed started on 11/21 per PMP database.  This was prescribed only for 4 days per database.  Although patient claims she took it for almost 7 to 10 days.  Her current symptoms including nausea abdominal pain and diarrhea started about a week to 10 days ago which aligns with opioid withdrawal.  She is having extreme nausea, diarrhea, abdominal pain, she was very tremulous, jittery and her pupils are very dilated.   Patient had a good response to oxycodone.  She has no more tremors or dilated pupils.  Still has some nausea but her abdominal pain is also significantly improved and she only reports 2 out of 10 pain.  We will cut back on oxycodone from every 6 to every 8 hours today

## 2021-01-14 NOTE — Assessment & Plan Note (Signed)
Stable

## 2021-01-14 NOTE — Progress Notes (Signed)
Progress Note    Rhonda Palmer   OEU:[id number]  DOB: 1970/08/06  DOA: 01/11/2021     3 Date of Service: 01/14/2021   Clinical Course  Rhonda Palmer is a 50 y.o. 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 CT scan showed postsurgical changes, left anterior abdominal mass, containing fat and a fluid density, could represent an area of fat necrosis.  12/18: Tremulous, dilated pupils and other symptoms which includes nausea, diarrhea, abdominal pain worrisome for opiate withdrawal.  We will restart oxycodone and monitor 12/19: Tolerating diet.  Still nauseous but not tremulous and pupils not dilated today   Assessment and Plan * Opioid withdrawal (HCC) Her signs and symptoms are concerning for same.  She was taking oxycodone 5 mg/5 ml (100 ml) every 6 hours as needed started on 11/21 per PMP database.  This was prescribed only for 4 days per database.  Although patient claims she took it for almost 7 to 10 days.  Her current symptoms including nausea abdominal pain and diarrhea started about a week to 10 days ago which aligns with opioid withdrawal.  She is having extreme nausea, diarrhea, abdominal pain, she was very tremulous, jittery and her pupils are very dilated.   Patient had a good response to oxycodone.  She has no more tremors or dilated pupils.  Still has some nausea but her abdominal pain is also significantly improved and she only reports 2 out of 10 pain.  We will cut back on oxycodone from every 6 to every 8 hours today  Diarrhea Stool for C. difficile negative.  This could be from opioid withdrawal and or from recent gastric bypass  with malabsorption issue  Acute metabolic encephalopathy Likely from opioid withdrawal.  She is mentating much better and back to her baseline  HLD (hyperlipidemia) Continue Zocor  Hypertension Stable  Depression with anxiety Continue Seroquel and Klonopin per psych.  Hydroxyzine added yesterday as well.  Appreciate psych input  Bipolar 1 disorder (HCC) Continue Seroquel and Klonopin per psych.  Hydroxyzine added yesterday as well  Hypokalemia Replete and recheck.  Magnesium within normal limits  Hypothyroidism Continue levothyroxine.  Patient was not taking this at home.  Her TSH on admission was 47.  She was on IV levothyroxine which will be changed to p.o. today as she is taking oral.  Her repeat TSH yesterday is 23.8 suggestive of improvement and trending the right direction  Schizoaffective disorder, bipolar type (HCC) Stable.  Seen by psych and resumed Seroquel     Subjective:  Feeling much better.  Abdominal pain 2-3 out of 10.  Not tremulous.  Slept well.  Still has some diarrhea but able to tolerate the diet.  Reports minimal nausea  Objective Vitals:   01/13/21 2125 01/14/21 0517 01/14/21 0730 01/14/21 1152  BP: 128/68 107/74 (!) 93/56 104/71  Pulse: 66 70 60 66  Resp: 20 15 14 16   Temp: 98 F (36.7 C) 97.8 F (36.6 C) 97.9 F (36.6 C) 97.8 F (36.6 C)  TempSrc: Oral Oral Oral Oral  SpO2: 94% 96% 99% 96%  Weight:      Height:       (!) 137.4 kg  Vital signs were reviewed and unremarkable.   Exam Physical Exam  General exam:Appears morbid obese. Eyes: Pupils within normal limit no more dilated like yesterday and reactive to light and accommodation Respiratory system: Clear to auscultation. Respiratory effort normal. Cardiovascular system:S1 &S2 heard, RRR. No JVD, murmurs, rubs, gallops or clicks. No pedal edema. Gastrointestinal system:Abdomen is nondistended, soft and RUQtender,negative Murphy sign.No organomegaly or masses felt. Normal  bowel sounds heard.  Healing surgical wound Central nervous system:  Awake and alert, nonfocal exam Extremities: Symmetric 5 x 5 power. Skin: No rashes, lesions or ulcers Psychiatry:Normal mood and affect.   Labs / Other Information My review of labs, imaging, notes and other tests is significant for Hypokalemia     Disposition Plan: Status is: Inpatient  Remains inpatient appropriate because: Still having ongoing symptoms and potassium replacement needed   Updated husband over phone.  Possible discharge in next 1 to 2 days depending on clinical condition     Time spent: 35 minutes [facility] 01/14/2021, 1:44 PM

## 2021-01-14 NOTE — Assessment & Plan Note (Signed)
Continue Seroquel and Klonopin per psych.  Hydroxyzine added yesterday as well

## 2021-01-14 NOTE — Assessment & Plan Note (Signed)
Continue Zocor 

## 2021-01-14 NOTE — Assessment & Plan Note (Signed)
Replete and recheck.  Magnesium within normal limits

## 2021-01-14 NOTE — Assessment & Plan Note (Signed)
Continue levothyroxine.  Patient was not taking this at home.  Her TSH on admission was 47.  She was on IV levothyroxine which will be changed to p.o. today as she is taking oral.  Her repeat TSH yesterday is 23.8 suggestive of improvement and trending the right direction

## 2021-01-14 NOTE — Assessment & Plan Note (Signed)
Continue Seroquel and Klonopin per psych.  Hydroxyzine added yesterday as well.  Appreciate psych input

## 2021-01-14 NOTE — Assessment & Plan Note (Signed)
Stable.  Seen by psych and resumed Seroquel

## 2021-01-14 NOTE — Progress Notes (Signed)
[facility] SURGICAL PROGRESS NOTE (cpt [id number])  Hospital Day(s): 3.   Interval History: Patient seen and examined, no acute events or new complaints overnight. Patient reports she continues to have what sounds like pain at her incisional sites but reports this has been constant since her bariatric procedure and may be slowly improving. She continues ot have intermittent nausea/emesis. She remains without leukocytosis; 8.0K. renal function remains normal; sCr - 0.66; UO - 200 ccs + unmeasured. Hypokalemia persistent, 3.0. She is on regular diet with bowel function. Seen by psychiatry yesterday afternoon  Review of Systems:  Constitutional: denies fever, chills  HEENT: denies cough or congestion  Respiratory: denies any shortness of breath  Cardiovascular: denies chest pain or palpitations  Gastrointestinal: Endorses abdominal pain (? Incisional), nausea, emesis Genitourinary: denies burning with urination or urinary frequency Musculoskeletal: denies pain, decreased motor or sensation  Vital signs in last 24 hours: [min-max] current  Temp:  [97.8 F (36.6 C)-98.4 F (36.9 C)] 97.9 F (36.6 C) (12/19 0730) Pulse Rate:  [60-76] 60 (12/19 0730) Resp:  [14-26] 14 (12/19 0730) BP: (93-141)/(56-92) 93/56 (12/19 0730) SpO2:  [94 %-100 %] 99 % (12/19 0730)     Height: 5\' 2"  (157.5 cm) Weight: (!) 137.4 kg BMI (Calculated): 55.39   Intake/Output last 2 shifts:  12/18 0701 - 12/19 0700 In: 360 [P.O.:360] Out: 200 [Urine:200]   Physical Exam:  Constitutional: alert, cooperative and no distress  HENT: normocephalic without obvious abnormality  Eyes: PERRL, EOM's grossly intact and symmetric  Respiratory: breathing non-labored at rest  Cardiovascular: regular rate and sinus rhythm  Gastrointestinal: abdomen is obese, soft, I am unable to illicit and tenderness with palpation, difficult to assess distension, no rebound/guarding, she is certainly not peritonitic Integumentary:  Laparoscopic incisions are well healed   Labs:  CBC Latest Ref Rng & Units 01/14/2021 01/11/2021 12/05/2020  WBC 4.0 - 10.5 K/uL 8.0 9.4 9.0  Hemoglobin 12.0 - 15.0 g/dL 13/09/2020 79.8 92.1  Hematocrit 36.0 - 46.0 % 39.3 45.0 44.4  Platelets 150 - 400 K/uL 234 321 290   CMP Latest Ref Rng & Units 01/14/2021 01/13/2021 01/12/2021  Glucose 70 - 99 mg/dL 01/14/2021) 174(Y) 814(G)  BUN 6 - 20 mg/dL 10 9 10   Creatinine 0.44 - 1.00 mg/dL 818(H 6.31  Sodium 135 - 145 mmol/L 143 139 142  Potassium 3.5 - 5.1 mmol/L 3.0(L) 3.3(L) 2.8(L)  Chloride 98 - 111 mmol/L 108 108 106  CO2 22 - 32 mmol/L 28 25 28   Calcium 8.9 - 10.3 mg/dL 4.97) 0.26) )  Total Protein 6.5 - 8.1 g/dL - - -  Total Bilirubin 0.3 - 1.2 mg/dL - - -  Alkaline Phos 38 - 126 U/L - - -  AST 15 - 41 U/L - - -  ALT 0 - 44 U/L - - -    Imaging studies: No new pertinent imaging studies   Assessment/Plan: (ICD-10's: R10.9) 50 y.o. female admitted for altered mentation, hypothyroidism, and abdominal pain, s/p gastric bypass surgery at [facility] 1 month ago.  CT A/P demonstrates anterior abdominal wall mass containing fat, likely fat necrosis   - Okay to continue regular diet as tolerated; encouraged smaller meals   - No signs/symptoms of infection   - No indication for surgical intervention   - Pain control prn primary service - Further management per primary service  - General surgery will sign off. Recommend follow up with bariatic surgeon at [facility] at discharge   All of the above findings  and recommendations were discussed with the patient, and the medical team, and all of patient's questions were answered to her expressed satisfaction.  -- Edison Simon, PA-C [facility] 01/14/2021, 10:48 AM [phone] M-F: 7am - 4pm

## 2021-01-14 NOTE — Assessment & Plan Note (Addendum)
Stool for C. difficile negative.  This could be from opioid withdrawal and or from recent gastric bypass with malabsorption issue

## 2021-01-14 NOTE — Assessment & Plan Note (Signed)
Likely from opioid withdrawal.  She is mentating much better and back to her baseline

## 2021-01-15 DIAGNOSIS — E876 Hypokalemia: Secondary | ICD-10-CM

## 2021-01-15 DIAGNOSIS — R410 Disorientation, unspecified: Secondary | ICD-10-CM

## 2021-01-15 DIAGNOSIS — R531 Weakness: Secondary | ICD-10-CM

## 2021-01-15 LAB — CBC
HCT: 37.7 % (ref 36.0–46.0)
Hemoglobin: 11.4 g/dL — ABNORMAL LOW (ref 12.0–15.0)
MCH: 27.5 pg (ref 26.0–34.0)
MCHC: 30.2 g/dL (ref 30.0–36.0)
MCV: 90.8 fL (ref 80.0–100.0)
Platelets: 228 10*3/uL (ref 150–400)
RBC: 4.15 MIL/uL (ref 3.87–5.11)
RDW: 17 % — ABNORMAL HIGH (ref 11.5–15.5)
WBC: 8.2 10*3/uL (ref 4.0–10.5)
nRBC: 0 % (ref 0.0–0.2)

## 2021-01-15 LAB — GLUCOSE, CAPILLARY: Glucose-Capillary: 265 mg/dL — ABNORMAL HIGH (ref 70–99)

## 2021-01-15 LAB — BASIC METABOLIC PANEL
Anion gap: 5 (ref 5–15)
BUN: 12 mg/dL (ref 6–20)
CO2: 29 mmol/L (ref 22–32)
Calcium: 8.5 mg/dL — ABNORMAL LOW (ref 8.9–10.3)
Chloride: 107 mmol/L (ref 98–111)
Creatinine, Ser: 0.56 mg/dL (ref 0.44–1.00)
GFR, Estimated: >60 mL/min (ref >60)
Glucose, Bld: 107 mg/dL — ABNORMAL HIGH (ref 70–99)
Potassium: 3.4 mmol/L — ABNORMAL LOW (ref 3.5–5.1)
Sodium: 141 mmol/L (ref 135–145)

## 2021-01-15 MED ORDER — SUCRALFATE 1 GM/10ML PO SUSP
1.0000 g | Freq: Three times a day (TID) | ORAL | 0 refills | Status: DC
Start: 2021-01-15 — End: 2022-01-12

## 2021-01-15 MED ORDER — LEVOTHYROXINE SODIUM 175 MCG PO TABS: 350.0000 ug | ORAL_TABLET | Freq: Every day | ORAL | 0 refills | Status: DC

## 2021-01-15 MED ORDER — LACTINEX PO CHEW: 1.0000 | CHEWABLE_TABLET | Freq: Three times a day (TID) | ORAL | 0 refills | Status: AC

## 2021-01-15 MED ORDER — LOPERAMIDE HCL 2 MG PO TABS: 2.0000 mg | ORAL_TABLET | Freq: Four times a day (QID) | ORAL | 0 refills | Status: DC | PRN

## 2021-01-15 MED ORDER — ONDANSETRON HCL 4 MG PO TABS: 4.0000 mg | ORAL_TABLET | Freq: Three times a day (TID) | ORAL | 0 refills | Status: AC | PRN

## 2021-01-15 NOTE — Progress Notes (Signed)
Gave a free bottle of probiotics and imodium to the patient due to cost issues per MD directions.   Thanks, Paschal Dopp, PharmD, BCPS

## 2021-01-15 NOTE — Progress Notes (Signed)
Rhonda Palmer to be D/C'd Home per MD order.  Discussed prescriptions and follow up appointments with the patient. Prescriptions were  sent to pt's pharmacy, medication list explained in detail. Pt verbalized understanding.  Allergies as of 01/15/2021       Reactions   Codeine Hives, Itching, Rash, Swelling   Sulfa Antibiotics Anaphylaxis   Penicillins Itching        Medication List     STOP taking these medications    cyclobenzaprine 5 MG tablet Commonly known as: FLEXERIL   gabapentin 300 MG capsule Commonly known as: NEURONTIN   ibuprofen 800 MG tablet Commonly known as: ADVIL   tiZANidine 4 MG tablet Commonly known as: ZANAFLEX   torsemide 20 MG tablet Commonly known as: DEMADEX       TAKE these medications    albuterol 108 (90 Base) MCG/ACT inhaler Commonly known as: VENTOLIN HFA Inhale 2 puffs into the lungs every 6 (six) hours as needed for wheezing or shortness of breath.   [place] 1064 MG/3.9ML prefilled syringe Generic drug: ARIPiprazole Lauroxil ER Inject 1,064 mg into the muscle every 8 (eight) weeks. What changed: Another medication with the same name was removed. Continue taking this medication, and follow the directions you see here.   clonazePAM 0.5 MG tablet Commonly known as: KLONOPIN Take 0.5 mg by mouth 3 (three) times daily.   lactobacillus acidophilus & bulgar chewable tablet Chew 1 tablet by mouth 3 (three) times daily with meals for 7 days.   levothyroxine 175 MCG tablet Commonly known as: SYNTHROID Take 2 tablets (350 mcg total) by mouth daily at 6 (six) AM. Start taking on: January 16, 2021 What changed:  medication strength how much to take when to take this Another medication with the same name was removed. Continue taking this medication, and follow the directions you see here.   lisinopril 10 MG tablet Commonly known as: ZESTRIL Take 10 mg by mouth daily.   loperamide 2 MG tablet Commonly known as: Imodium  A-D Take 1 tablet (2 mg total) by mouth 4 (four) times daily as needed for diarrhea or loose stools.   ondansetron 4 MG tablet Commonly known as: ZOFRAN Take 1 tablet (4 mg total) by mouth every 8 (eight) hours as needed for up to 10 days.   QUEtiapine 300 MG tablet Commonly known as: SEROQUEL Take 600 mg by mouth at bedtime.   simvastatin 20 MG tablet Commonly known as: ZOCOR Take 20 mg by mouth at bedtime.   sucralfate 1 GM/10ML suspension Commonly known as: CARAFATE Take 10 mLs (1 g total) by mouth 4 (four) times daily -  with meals and at bedtime.   zolpidem 5 MG tablet Commonly known as: AMBIEN Take 5 mg by mouth at bedtime as needed.        Vitals:   01/15/21 0505 01/15/21 0756  BP: 101/68 111/74  Pulse: 66 67  Resp: 20 16  Temp: 97.7 F (36.5 C) 98.5 F (36.9 C)  SpO2: 97% 98%    Tele box removed and returned. Skin clean, dry and intact without evidence of skin break down, no evidence of skin tears noted. IV catheter discontinued intact. Site without signs and symptoms of complications. Dressing and pressure applied. Pt denies pain at this time. No complaints noted.  An After Visit Summary was printed and given to the patient. Patient escorted via WC, and D/C home via private auto.

## 2021-01-15 NOTE — TOC Transition Note (Signed)
Transition of Care [facility]) - CM/SW Discharge Note   Patient Details  Name: Heather Mckendree MRN: [id number] Date of Birth: 20-Mar-1970  [facility]) CM/SW Contact:  Candie Chroman, LCSW Phone Number: 01/15/2021, 3:48 PM   Clinical Narrative: Patient has orders to discharge home today. Readmission prevention screen complete. CSW met with patient. No supports at bedside. CSW introduced role and explained that discharge planning would be discussed. PCP is Eda Paschal, NP. [facility] transports to appointments. Pharmacy is [facility]. She was unable to afford copay for new medications so TOC CMA provided petty cash to cover cost. Patient has an aide that comes to her home 3 days a week for about 1 1/2 hours. She uses a walker prn. Patient was set up with oxygen last admission through [employer] which she said she still uses prn. She was not on oxygen at time of assessment. No further concerns. CSW signing off.    Final next level of care: Home/Self Care Barriers to Discharge: No Barriers Identified   Patient Goals and CMS Choice        Discharge Placement                    Patient and family notified of of transfer: 01/15/21  Discharge Plan and Services                                     Social Determinants of Health (SDOH) Interventions     Readmission Risk Interventions Readmission Risk Prevention Plan 01/15/2021  Transportation Screening Complete  PCP or Specialist Appt within 3-5 Days Complete  Social Work Consult for [place] Planning/Counseling [place] Not Applicable  Medication Review [employer]) Complete  Some recent data might be hidden

## 2021-01-18 NOTE — Discharge Summary (Signed)
Physician Discharge Summary   Patient: Rhonda Palmer MRN: [id number] DOB: @DOB   Admit date:     01/11/2021  Discharge date: 01/15/2021  Discharge Physician: Max Sane   PCP: Gae Bon, NP   Recommendations at discharge: 1. F/up with outpt providers as requested  Discharge Diagnoses Principal Problem:   Opioid withdrawal ([place]) Active Problems:   Schizoaffective disorder, bipolar type ([place])   Hypothyroidism   Hypokalemia   Asthma   Bipolar 1 disorder (HCC)   Depression with anxiety   Hypertension   HLD (hyperlipidemia)   Acute metabolic encephalopathy   Abdominal pain   Obesity, Class III, BMI 40-49.9 (morbid obesity) ([place])   Prolonged QT interval   Diarrhea   Confusion   Weakness  Hospital Course   Zanea Airington is a 50 y.o. 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 CT scan showed postsurgical changes, left anterior abdominal mass, containing fat and a fluid density, could represent an area of fat necrosis.  12/18: Tremulous, dilated pupils and other symptoms which includes nausea, diarrhea, abdominal pain worrisome for opiate withdrawal.  We will restart oxycodone and monitor 12/19: Tolerating diet.  Still nauseous but not tremulous and pupils not dilated today  * Opioid withdrawal ([place])- (present on admission) Her signs and symptoms are concerning for same. She had symptoms of extreme nausea, diarrhea, abdominal pain. she was very tremulous, jittery and her pupils are very dilated.   Patient had a good response to oxycodone.  She had no more tremors or dilated pupils.  Still has some nausea but her abdominal pain is  also significantly improved and she only reports 2 out of 10 pain.  Oxycodone was slowly tapered off while in the Hospital with no recurrence of symptoms.  Diarrhea- (present on admission) Stool for C. difficile negative.  This could be from opioid withdrawal and or from recent gastric bypass with malabsorption issue and or thyroid issue.  Prolonged QT interval- (present on admission) Avoid drugs causing QT prolongation or use with caution  Obesity, Class III, BMI 40-49.9 (morbid obesity) ([place])- (present on admission) Status post gastric bypass at [facility] 3 weeks ago. Recommend close f/up with his bariatric surgeon post d/c.  Abdominal pain- (present on admission) Likely from recent surgery and or opioid withdrawal  Acute metabolic encephalopathy- (present on admission) Likely from opioid withdrawal.  She is mentating much better and back to her baseline  HLD (hyperlipidemia)- (present on admission) Continue Zocor  Hypertension- (present on admission) Stable  Depression with anxiety, Schizoaffective disorder, bipolar type ([place])- (present on admission) Continue Seroquel and Klonopin per psych.needs close outpt psych f/up. Was seen by psych while in the hospital  Hypokalemia- (present on admission) Repleted  Hypothyroidism- (present on admission) Continue levothyroxine.  Patient was not taking this at home.  Her TSH on admission was 47.  She was on IV levothyroxine which will be changed to p.o once she was taking oral.  Her repeat TSH on 12/18 was 23.8 suggestive of improvement and trending the right direction. Counseled her on taking this regularly as prescribed with close f/up with her PCP and/ Endocrinology as this was likely contributing to her illness.      Pain control - [employer] Controlled Substance Reporting System database was reviewed. and  patient was instructed, not to drive, operate heavy machinery, perform activities at heights, swimming or participation in water  activities or provide baby-sitting services while on Pain, Sleep and Anxiety Medications; until their outpatient Physician has advised to do so again. Also recommended to not to take more than prescribed Pain, Sleep and Anxiety Medications.   Consultants: Surgery, Psych Procedures performed: None  Disposition: Home Diet recommendation: Carb modified diet  DISCHARGE MEDICATION: Allergies as of 01/15/2021       Reactions   Codeine Hives, Itching, Rash, Swelling   Sulfa Antibiotics Anaphylaxis   Penicillins Itching        Medication List     STOP taking these medications    cyclobenzaprine 5 MG tablet Commonly known as: FLEXERIL   gabapentin 300 MG capsule Commonly known as: NEURONTIN   ibuprofen 800 MG tablet Commonly known as: ADVIL   tiZANidine 4 MG tablet Commonly known as: ZANAFLEX   torsemide 20 MG tablet Commonly known as: DEMADEX       TAKE these medications    albuterol 108 (90 Base) MCG/ACT inhaler Commonly known as: VENTOLIN HFA Inhale 2 puffs into the lungs every 6 (six) hours as needed for wheezing or shortness of breath.   [place] 1064 MG/3.9ML prefilled syringe Generic drug: ARIPiprazole Lauroxil ER Inject 1,064 mg into the muscle every 8 (eight) weeks. What changed: Another medication with the same name was removed. Continue taking this medication, and follow the directions you see here.   clonazePAM 0.5 MG tablet Commonly known as: KLONOPIN Take 0.5 mg by mouth 3 (three) times daily.   lactobacillus acidophilus & bulgar chewable tablet Chew 1 tablet by mouth 3 (three) times daily with meals for 7 days.   levothyroxine 175 MCG tablet Commonly known as: SYNTHROID Take 2 tablets (350 mcg total) by mouth daily at 6 (six) AM. What changed:  medication strength how much to take when to take this Another medication with the same name was removed. Continue taking this medication, and follow the directions you see here.   lisinopril 10 MG  tablet Commonly known as: ZESTRIL Take 10 mg by mouth daily.   loperamide 2 MG tablet Commonly known as: Imodium A-D Take 1 tablet (2 mg total) by mouth 4 (four) times daily as needed for diarrhea or loose stools.   ondansetron 4 MG tablet Commonly known as: ZOFRAN Take 1 tablet (4 mg total) by mouth every 8 (eight) hours as needed for up to 10 days.   QUEtiapine 300 MG tablet Commonly known as: SEROQUEL Take 600 mg by mouth at bedtime.   simvastatin 20 MG tablet Commonly known as: ZOCOR Take 20 mg by mouth at bedtime.   sucralfate 1 GM/10ML suspension Commonly known as: CARAFATE Take 10 mLs (1 g total) by mouth 4 (four) times daily -  with meals and at bedtime.   zolpidem 5 MG tablet Commonly known as: AMBIEN Take 5 mg by mouth at bedtime as needed.        Follow-up Information     Gae Bon, NP. Schedule an appointment as soon as possible for a visit in 3 day(s).   Specialty: Nurse Practitioner Why: [facility] Discharge F/UP Patient to make own follow up appt Contact information: [place] [address] [phone]         Kate Sable, MD. Schedule an appointment as soon as possible for a visit in 1 week(s).   Specialties: Cardiology, Radiology Why: Patient to make own follow up appt  Contact information: [place] [address] [phone]         Nicki Guadalajara Follow up on 01/29/2021.   Why: Patient to make own follow up appt Contact information: [address] [id number] [place] [address] [phone]                 Discharge Exam: Danley Danker Weights   01/11/21 1130 01/13/21 0516 01/15/21 0500  Weight: 117.9 kg (!) 137.4 kg (!) 139.3 kg    General exam: Appears morbid obese. Eyes: Pupils within normal limit no more dilated like yesterday and reactive to light and accommodation Respiratory system: Clear to auscultation. Respiratory effort normal. Cardiovascular  system: S1 & S2 heard, RRR. No JVD, murmurs, rubs, gallops or clicks. No pedal edema. Gastrointestinal system: Abdomen is nondistended, soft, negative Murphy sign.  No organomegaly or masses felt. Normal bowel sounds heard.  Healing surgical wound Central nervous system:   Awake and alert, nonfocal exam Extremities: Symmetric 5 x 5 power. Skin: No rashes, lesions or ulcers Psychiatry: Normal mood and affect.   Condition at discharge: good  The results of significant diagnostics from this hospitalization (including imaging, microbiology, ancillary and laboratory) are listed below for reference.   Imaging Studies: DG Chest 2 View  Result Date: 01/11/2021 CLINICAL DATA:  Altered mental status for 2 weeks, progressively worsening EXAM: CHEST - 2 VIEW COMPARISON:  12/04/2020 FINDINGS: Upper normal heart size with normal pulmonary vascularity. Chronic enlargement of RIGHT hilum. Postsurgical changes RIGHT upper lobe with scarring. No definite infiltrate, pleural effusion, or pneumothorax. Osseous structures demineralized. IMPRESSION: Postsurgical changes and scarring in RIGHT upper lobe. Chronic prominence of RIGHT hilum, unchanged, with no hilar mass/adenopathy identified on recent CT. No new abnormalities. Electronically Signed   By: Lavonia Dana M.D.   On: 01/11/2021 12:08   CT HEAD WO CONTRAST (5MM)  Result Date: 01/11/2021 CLINICAL DATA:  Mental status change EXAM: CT HEAD WITHOUT CONTRAST TECHNIQUE: Contiguous axial images were obtained from the base of the skull through the vertex without intravenous contrast. COMPARISON:  Head CT 12/04/2020 FINDINGS: Brain: No evidence of acute infarction, hemorrhage, hydrocephalus, extra-axial collection or mass lesion/mass effect. Vascular: Negative for hyperdense vessel Skull: Negative Sinuses/Orbits: Paranasal sinuses clear.  Negative orbit Other: None IMPRESSION: Negative CT head Electronically Signed   By: Franchot Gallo M.D.   On: 01/11/2021 13:59   CT  ABDOMEN PELVIS W CONTRAST  Result Date: 01/11/2021 CLINICAL DATA:  Confusion history of surgery 1 month ago EXAM: CT ABDOMEN AND PELVIS WITH CONTRAST TECHNIQUE: Multidetector CT imaging of the abdomen and pelvis was performed using the standard protocol following bolus administration of intravenous contrast. CONTRAST:  171mL OMNIPAQUE IOHEXOL 300 MG/ML  SOLN COMPARISON:  CT 11/26/2018 FINDINGS: Lower chest: Lung bases demonstrate no acute consolidation or effusion. Normal cardiac size. Hepatobiliary: Heterogeneous hepatic steatosis. Status post cholecystectomy. No biliary dilatation Pancreas: Unremarkable. No pancreatic ductal dilatation or surrounding inflammatory changes. Spleen: Normal in size without focal abnormality. Adrenals/Urinary Tract: Adrenal glands are normal. Kidneys show no hydronephrosis. Punctate stone in the mid left kidney. The bladder is normal Stomach/Bowel: Interval postsurgical changes of the stomach consistent with gastric bypass. No dilated small bowel to suggest an obstruction. There is no acute bowel wall thickening. The appendix is negative. Vascular/Lymphatic: Nonaneurysmal aorta.  No suspicious lymph nodes Reproductive: Status post hysterectomy. No adnexal masses. Other: Negative for pelvic effusion or free air. Linear density with surrounding soft tissue stranding in the right abdominal subcutaneous fat, extending to the  cutaneous surface and right rectus presumably related to history of recent surgery. Heterogeneous mass measuring 6.9 by 5.1 cm within the left anterior abdominal mesentery, contains fluid level and internal fat density. Multiple subcutaneous nodules within the posterior flank regions, measuring up to 32 mm. Some are fluid density and some are more hyperdense. Musculoskeletal: No acute osseous abnormality IMPRESSION: 1. Interval postsurgical changes of the stomach and small bowel consistent with gastric bypass surgery. Linear tract in the right anterior abdominal  wall with surrounding soft tissue stranding most likely due to postsurgical change/possible prior laparoscopic port placement. Heterogeneous left anterior abdominal mass containing fat and fluid density, also likely related to history of recent surgery and could represent an area of fat necrosis. There is no evidence for a bowel obstruction. 2. Hepatic steatosis 3. Punctate nonobstructing left kidney stone 4. Multiple subcutaneous nodules or possible cysts within the subcutaneous fat of the posterior flank regions Electronically Signed   By: Donavan Foil M.D.   On: 01/11/2021 15:36    Microbiology: Results for orders placed or performed during the hospital encounter of 01/11/21  Resp Panel by RT-PCR (Flu A&B, Covid) Nasopharyngeal Swab     Status: None   Collection Time: 01/11/21  4:08 PM   Specimen: Nasopharyngeal Swab; Nasopharyngeal(NP) swabs in vial transport medium  Result Value Ref Range Status   SARS Coronavirus 2 by RT PCR NEGATIVE NEGATIVE Final    Comment: (NOTE) SARS-CoV-2 target nucleic acids are NOT DETECTED.  The SARS-CoV-2 RNA is generally detectable in upper respiratory specimens during the acute phase of infection. The lowest concentration of SARS-CoV-2 viral copies this assay can detect is 138 copies/mL. A negative result does not preclude SARS-Cov-2 infection and should not be used as the sole basis for treatment or other patient management decisions. A negative result may occur with  improper specimen collection/handling, submission of specimen other than nasopharyngeal swab, presence of viral mutation(s) within the areas targeted by this assay, and inadequate number of viral copies(<138 copies/mL). A negative result must be combined with clinical observations, patient history, and epidemiological information. The expected result is Negative.  Fact Sheet for Patients:  EntrepreneurPulse.com.au  Fact Sheet for Healthcare Providers:   IncredibleEmployment.be  This test is no t yet approved or cleared by the [place] FDA and  has been authorized for detection and/or diagnosis of SARS-CoV-2 by FDA under an Emergency Use Authorization (EUA). This EUA will remain  in effect (meaning this test can be used) for the duration of the COVID-19 declaration under Section 564(b)(1) of the Act, 21 U.S.C.section 360bbb-3(b)(1), unless the authorization is terminated  or revoked sooner.       Influenza A by PCR NEGATIVE NEGATIVE Final   Influenza B by PCR NEGATIVE NEGATIVE Final    Comment: (NOTE) The Xpert Xpress SARS-CoV-2/FLU/RSV plus assay is intended as an aid in the diagnosis of influenza from Nasopharyngeal swab specimens and should not be used as a sole basis for treatment. Nasal washings and aspirates are unacceptable for Xpert Xpress SARS-CoV-2/FLU/RSV testing.  Fact Sheet for Patients: EntrepreneurPulse.com.au  Fact Sheet for Healthcare Providers: IncredibleEmployment.be  This test is not yet approved or cleared by the [place] FDA and has been authorized for detection and/or diagnosis of SARS-CoV-2 by FDA under an Emergency Use Authorization (EUA). This EUA will remain in effect (meaning this test can be used) for the duration of the COVID-19 declaration under Section 564(b)(1) of the Act, 21 U.S.C. section 360bbb-3(b)(1), unless the authorization is terminated or revoked.  Performed at [facility], [address] [place], [place] [address]   C Difficile Quick Screen w PCR reflex     Status: None   Collection Time: 01/12/21 12:00 PM   Specimen: Stool  Result Value Ref Range Status   C Diff antigen NEGATIVE NEGATIVE Final   C Diff toxin NEGATIVE NEGATIVE Final   C Diff interpretation No C. difficile detected.  Final    Comment: Performed at [facility], [address] [place], [place] [address]  Gastrointestinal Panel  by PCR , Stool     Status: None   Collection Time: 01/12/21 12:00 PM   Specimen: Stool  Result Value Ref Range Status   Campylobacter species NOT DETECTED NOT DETECTED Final   Plesimonas shigelloides NOT DETECTED NOT DETECTED Final   Salmonella species NOT DETECTED NOT DETECTED Final   Yersinia enterocolitica NOT DETECTED NOT DETECTED Final   Vibrio species NOT DETECTED NOT DETECTED Final   Vibrio cholerae NOT DETECTED NOT DETECTED Final   Enteroaggregative E coli (EAEC) NOT DETECTED NOT DETECTED Final   Enteropathogenic E coli (EPEC) NOT DETECTED NOT DETECTED Final   Enterotoxigenic E coli (ETEC) NOT DETECTED NOT DETECTED Final   Shiga like toxin producing E coli (STEC) NOT DETECTED NOT DETECTED Final   Shigella/Enteroinvasive E coli (EIEC) NOT DETECTED NOT DETECTED Final   Cryptosporidium NOT DETECTED NOT DETECTED Final   Cyclospora cayetanensis NOT DETECTED NOT DETECTED Final   Entamoeba histolytica NOT DETECTED NOT DETECTED Final   Giardia lamblia NOT DETECTED NOT DETECTED Final   Adenovirus F40/41 NOT DETECTED NOT DETECTED Final   Astrovirus NOT DETECTED NOT DETECTED Final   Norovirus GI/GII NOT DETECTED NOT DETECTED Final   Rotavirus A NOT DETECTED NOT DETECTED Final   Sapovirus (I, II, IV, and V) NOT DETECTED NOT DETECTED Final    Comment: Performed at [facility], [address] [place], [place] [address]    Labs: CBC: Recent Labs  Lab 01/11/21 1135 01/14/21 0446 01/15/21 0403  WBC 9.4 8.0 8.2  NEUTROABS 5.7  --   --   HGB 13.7 12.1 11.4*  HCT 45.0 39.3 37.7  MCV 90.5 90.6 90.8  PLT 321 234 228   Basic Metabolic Panel: Recent Labs  Lab 01/11/21 1135 01/12/21 0504 01/13/21 0524 01/14/21 0446 01/15/21 0403  NA 142 142 139 143 141  K 2.7* 2.8* 3.3* 3.0* 3.4*  CL 105 106 108 108 107  CO2 28 28 25 28 29   GLUCOSE 145* 129* 128* 112* 107*  BUN 10 10 9 10 12   CREATININE 0.58 0.51 0.49 0.66 0.56  CALCIUM 8.9 8.6* 8.7* 8.7* 8.5*  MG 2.1  --  2.1 2.3   --   PHOS  --   --  3.2  --   --    Liver Function Tests: Recent Labs  Lab 01/11/21 1135  AST 30  ALT 32  ALKPHOS 136*  BILITOT 0.8  PROT 7.5  ALBUMIN 3.4*   CBG: Recent Labs  Lab 01/13/21 0937 01/14/21 0731 01/14/21 1152 01/14/21 1622 01/15/21 0756  GLUCAP 119* 90 112* 126* 265*    Discharge time spent: greater than 30 minutes.  Signed:  01/16/21 MD.  [facility] 01/18/2021

## 2021-07-22 ENCOUNTER — Encounter: Payer: Self-pay | Admitting: *Deleted

## 2021-07-29 ENCOUNTER — Ambulatory Visit: Payer: Medicaid Other | Admitting: Cardiology

## 2021-08-08 ENCOUNTER — Ambulatory Visit: Payer: Medicaid Other | Admitting: Cardiology

## 2021-09-26 ENCOUNTER — Ambulatory Visit: Payer: Medicaid Other | Attending: Cardiology | Admitting: Cardiology

## 2021-09-26 ENCOUNTER — Encounter: Payer: Self-pay | Admitting: Cardiology

## 2021-10-16 IMAGING — CR DG CHEST 2V
2 series · 2 of 2 positions shown · non-contrast
Comparison: 02/22/2020

CLINICAL DATA: Chest pain and shortness of breath

EXAM:
CHEST - 2 VIEW

[chest pa]
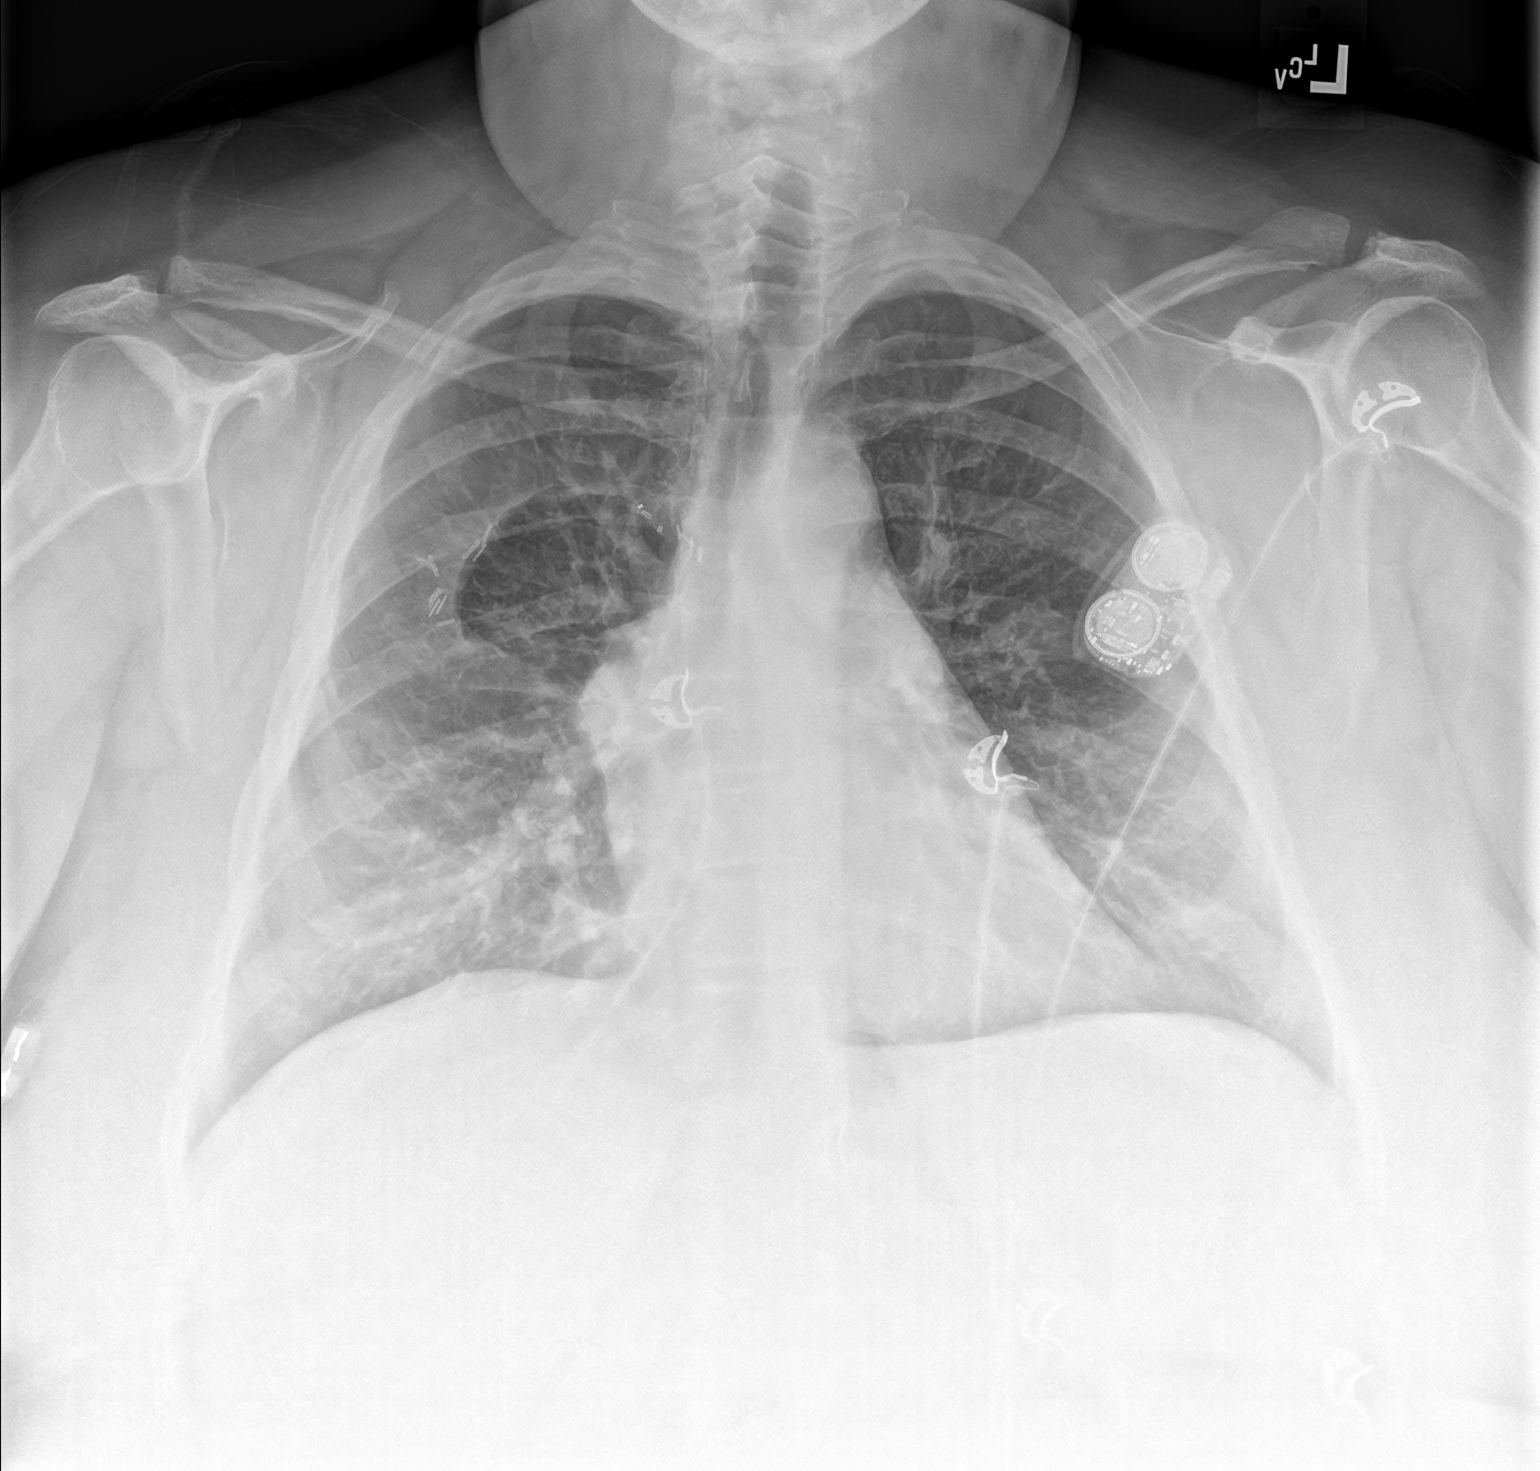

[chest lat]
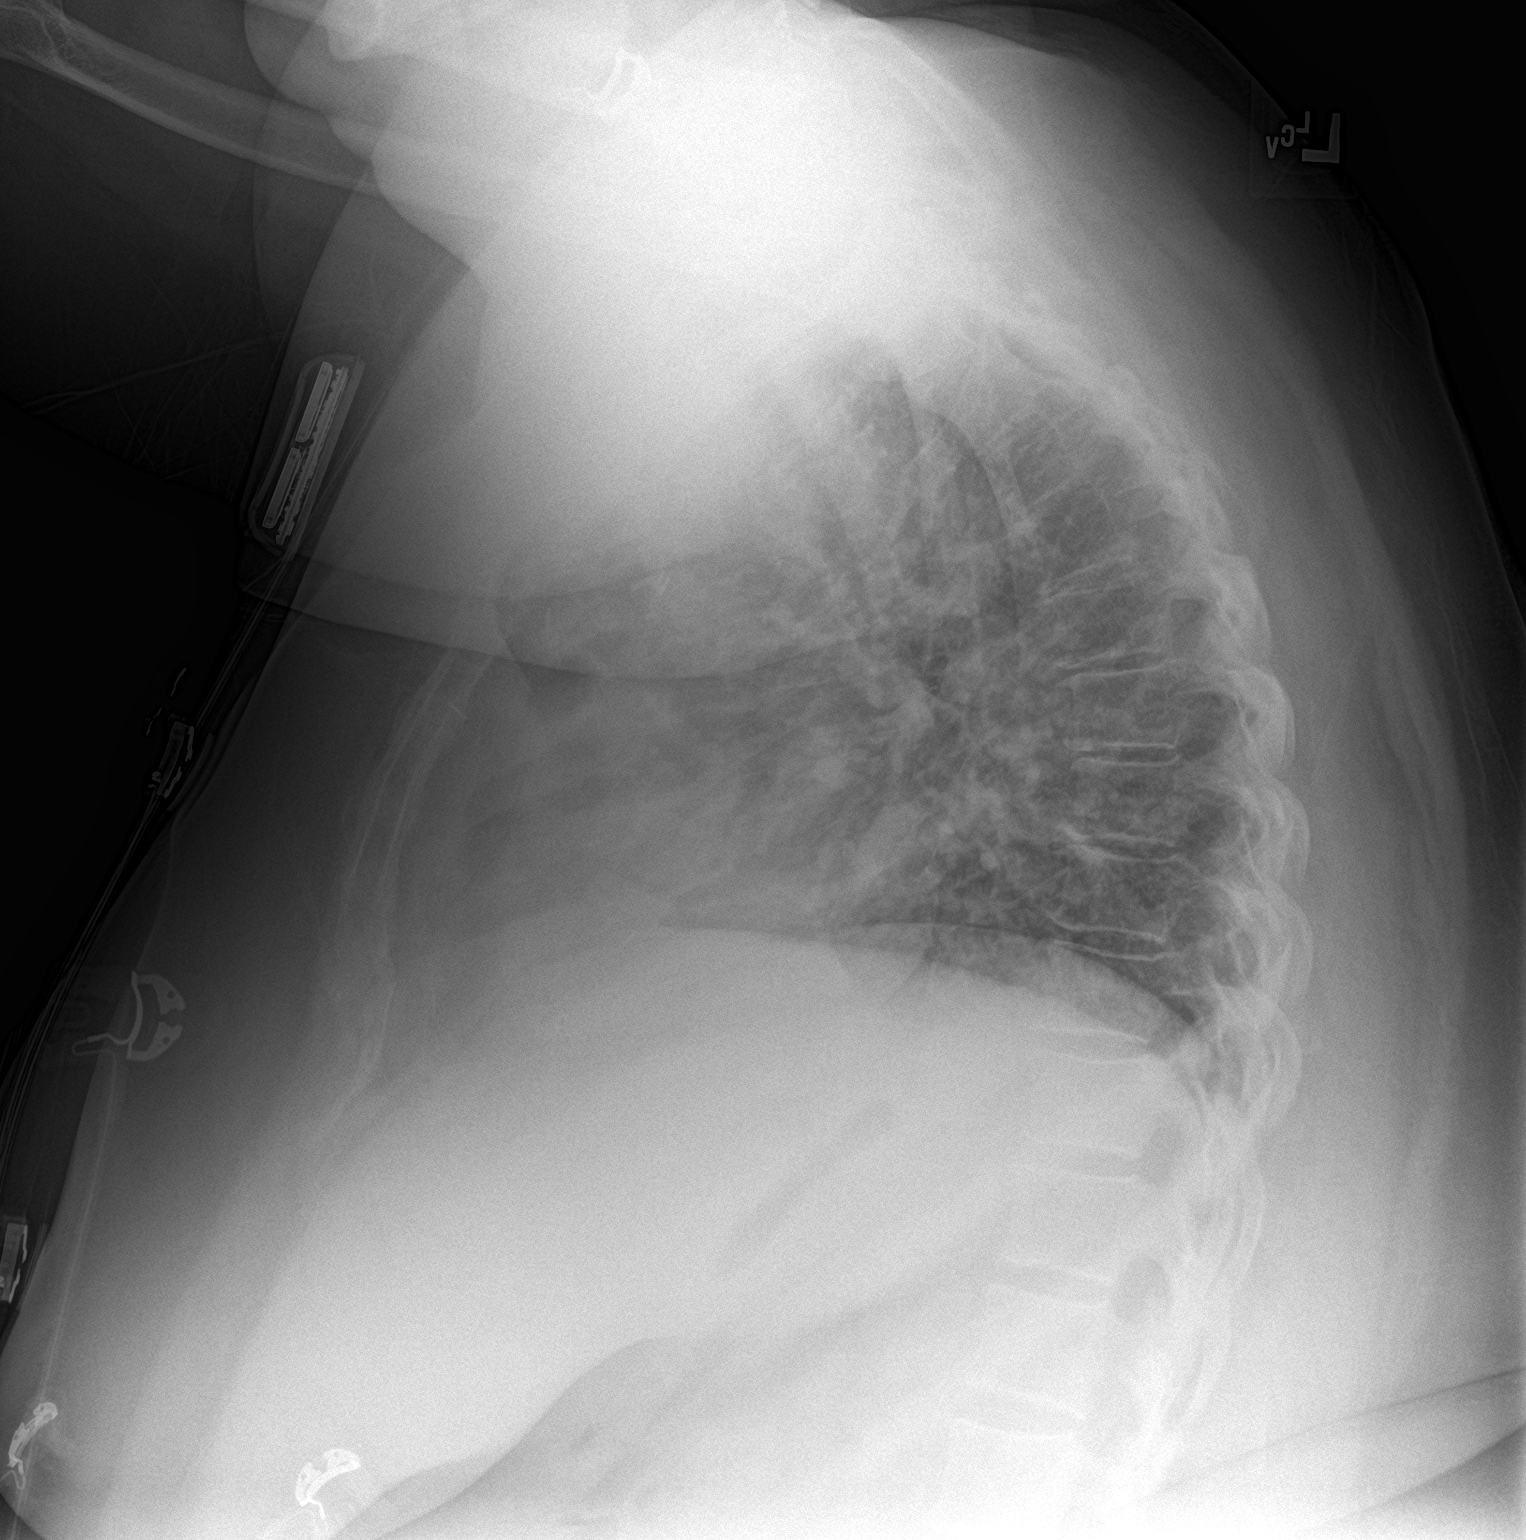

[2 of 2 positions shown; findings below may reference images not displayed]

FINDINGS: Postoperative right chest wall with lucency from missing anterior
rib. New implant over the left chest. Low lung volumes. There is no
edema, consolidation, effusion, or pneumothorax. No acute osseous
finding.
IMPRESSION: Stable exam.  No evidence of active disease.

## 2021-11-06 ENCOUNTER — Other Ambulatory Visit: Payer: Self-pay | Admitting: Adult Health

## 2021-11-06 ENCOUNTER — Other Ambulatory Visit: Payer: Self-pay | Admitting: Internal Medicine

## 2021-11-06 DIAGNOSIS — Z1231 Encounter for screening mammogram for malignant neoplasm of breast: Secondary | ICD-10-CM

## 2021-12-08 ENCOUNTER — Emergency Department
Admission: EM | Admit: 2021-12-08 | Discharge: 2021-12-08 | Disposition: A | Discharge status: Home / Self Care | Payer: Medicaid Other | Attending: Emergency Medicine | Admitting: Emergency Medicine

## 2021-12-08 ENCOUNTER — Other Ambulatory Visit: Payer: Self-pay

## 2021-12-08 DIAGNOSIS — I509 Heart failure, unspecified: Secondary | ICD-10-CM | POA: Diagnosis not present

## 2021-12-08 DIAGNOSIS — I110 Hypertensive heart disease with heart failure: Secondary | ICD-10-CM | POA: Insufficient documentation

## 2021-12-08 DIAGNOSIS — R202 Paresthesia of skin: Secondary | ICD-10-CM | POA: Diagnosis present

## 2021-12-08 DIAGNOSIS — E039 Hypothyroidism, unspecified: Secondary | ICD-10-CM | POA: Diagnosis not present

## 2021-12-08 DIAGNOSIS — E876 Hypokalemia: Secondary | ICD-10-CM | POA: Insufficient documentation

## 2021-12-08 DIAGNOSIS — E119 Type 2 diabetes mellitus without complications: Secondary | ICD-10-CM | POA: Insufficient documentation

## 2021-12-08 LAB — URINALYSIS, ROUTINE W REFLEX MICROSCOPIC
Bilirubin Urine: NEGATIVE
Glucose, UA: NEGATIVE mg/dL
Hgb urine dipstick: NEGATIVE
Ketones, ur: NEGATIVE mg/dL
Leukocytes,Ua: NEGATIVE
Nitrite: NEGATIVE
Protein, ur: NEGATIVE mg/dL
Specific Gravity, Urine: 1.014 (ref 1.005–1.030)
pH: 5 (ref 5.0–8.0)

## 2021-12-08 LAB — MAGNESIUM: Magnesium: 2.2 mg/dL (ref 1.7–2.4)

## 2021-12-08 LAB — COMPREHENSIVE METABOLIC PANEL
ALT: 15 U/L (ref 0–44)
AST: 20 U/L (ref 15–41)
Albumin: 3.5 g/dL (ref 3.5–5.0)
Alkaline Phosphatase: 92 U/L (ref 38–126)
Anion gap: 8 (ref 5–15)
BUN: 12 mg/dL (ref 6–20)
CO2: 33 mmol/L — ABNORMAL HIGH (ref 22–32)
Calcium: 9.5 mg/dL (ref 8.9–10.3)
Chloride: 105 mmol/L (ref 98–111)
Creatinine, Ser: 0.7 mg/dL (ref 0.44–1.00)
GFR, Estimated: >60 mL/min (ref >60)
Glucose, Bld: 118 mg/dL — ABNORMAL HIGH (ref 70–99)
Potassium: 3 mmol/L — ABNORMAL LOW (ref 3.5–5.1)
Sodium: 146 mmol/L — ABNORMAL HIGH (ref 135–145)
Total Bilirubin: 1 mg/dL (ref 0.3–1.2)
Total Protein: 6.9 g/dL (ref 6.5–8.1)

## 2021-12-08 LAB — CBC WITH DIFFERENTIAL/PLATELET
Abs Immature Granulocytes: 0.03 10*3/uL (ref 0.00–0.07)
Basophils Absolute: 0.1 10*3/uL (ref 0.0–0.1)
Basophils Relative: 1 %
Eosinophils Absolute: 0.3 10*3/uL (ref 0.0–0.5)
Eosinophils Relative: 4 %
HCT: 40.6 % (ref 36.0–46.0)
Hemoglobin: 13.1 g/dL (ref 12.0–15.0)
Immature Granulocytes: 0 %
Lymphocytes Relative: 29 %
Lymphs Abs: 2.4 10*3/uL (ref 0.7–4.0)
MCH: 27.9 pg (ref 26.0–34.0)
MCHC: 32.3 g/dL (ref 30.0–36.0)
MCV: 86.6 fL (ref 80.0–100.0)
Monocytes Absolute: 0.6 10*3/uL (ref 0.1–1.0)
Monocytes Relative: 8 %
Neutro Abs: 4.9 10*3/uL (ref 1.7–7.7)
Neutrophils Relative %: 58 %
Platelets: 193 10*3/uL (ref 150–400)
RBC: 4.69 MIL/uL (ref 3.87–5.11)
RDW: 14.9 % (ref 11.5–15.5)
WBC: 8.3 10*3/uL (ref 4.0–10.5)
nRBC: 0 % (ref 0.0–0.2)

## 2021-12-08 MED ORDER — POTASSIUM CHLORIDE CRYS ER 20 MEQ PO TBCR
20.0000 meq | EXTENDED_RELEASE_TABLET | Freq: Once | ORAL | Status: AC
  Administered 2021-12-08: 20 meq via ORAL
  Filled 2021-12-08: qty 1

## 2021-12-08 MED ORDER — POTASSIUM CHLORIDE CRYS ER 20 MEQ PO TBCR: 20.0000 meq | EXTENDED_RELEASE_TABLET | Freq: Every day | ORAL | 0 refills | Status: DC

## 2021-12-08 NOTE — Discharge Instructions (Signed)
Call make an appointment with your primary care provider to have your potassium rechecked in the office.  A prescription for your potassium tablets was sent to the pharmacy for you to begin taking tomorrow and every day until completely finished.  Also a list of potassium rich foods was attached to your discharge papers.  At the time of your discharge the urine test had not resulted however you can see these results on MyChart.

## 2021-12-08 NOTE — ED Provider Notes (Signed)
[facility] Provider Note    Event Date/Time   First MD Initiated Contact with Patient 12/08/21 [phone]     (approximate)   History   No chief complaint on file.   HPI  Rhonda Palmer is a 51 y.o. female   presents to the ED with complaint of "convulsions" in her arms and legs radiating to her back.  Patient states that this started yesterday afternoon.  She describes her legs as "feeling like they were going buckle".  Patient denies any fall or injury.  She also denies any history of seizures.  Patient does have a history of diabetes, fibromyalgia, hypertension, hypothyroidism, schizoaffective schizophrenia, long-term opiate use, hypokalemia and heart failure with a preserved ejection fraction.      Physical Exam   Triage Vital Signs: ED Triage Vitals  Enc Vitals Group     BP 12/08/21 0914 136/89     Pulse Rate 12/08/21 0914 80     Resp 12/08/21 0914 18     Temp 12/08/21 0914 98 F (36.7 C)     Temp Source 12/08/21 0914 Oral     SpO2 12/08/21 0914 99 %     Weight 12/08/21 0916 200 lb (90.7 kg)     Height 12/08/21 0916 5\' 1"  (1.549 m)     Head Circumference --      Peak Flow --      Pain Score 12/08/21 0916 7     Pain Loc --      Pain Edu? --      Excl. in GC? --     Most recent vital signs: Vitals:   12/08/21 0914  BP: 136/89  Pulse: 80  Resp: 18  Temp: 98 F (36.7 C)  SpO2: 99%     General: Awake, no distress.  Alert, cooperative. CV:  Good peripheral perfusion.  Heart regular rate and rhythm. Resp:  Normal effort.  Lungs are clear bilaterally. Abd:  No distention.  Other:  Moves upper and lower extremities without any difficulty.  Patient is able to stand and ambulate without any assistance.  No deformity or edema noted.   ED Results / Procedures / Treatments   Labs (all labs ordered are listed, but only abnormal results are displayed) Labs Reviewed  URINALYSIS, ROUTINE W REFLEX MICROSCOPIC - Abnormal; Notable for the  following components:      Result Value   Color, Urine YELLOW (*)    APPearance CLOUDY (*)    All other components within normal limits  COMPREHENSIVE METABOLIC PANEL - Abnormal; Notable for the following components:   Sodium 146 (*)    Potassium 3.0 (*)    CO2 33 (*)    Glucose, Bld 118 (*)    All other components within normal limits  CBC WITH DIFFERENTIAL/PLATELET  MAGNESIUM       PROCEDURES:  Critical Care performed:   Procedures   MEDICATIONS ORDERED IN ED: Medications  potassium chloride SA (KLOR-CON M) CR tablet 20 mEq (20 mEq Oral Given 12/08/21 1216)     IMPRESSION / MDM / ASSESSMENT AND PLAN / ED COURSE  I reviewed the triage vital signs and the nursing notes.   Differential diagnosis includes, but is not limited to, muscle cramps, hypomagnesia, hypokalemia, muscle skeletal pain, weakness.  51 year old female presents to the ED with complaint of spasms of her muscles that made her legs feel weak but no history of fall.  Patient initially described it as "like a seizure" but no history of  seizures in the past.  Lab work revealed that patient is hypokalemic but remaining lab work was reassuring with magnesium 2.2, CBC reassuring.  Patient was made aware that she was low in potassium and potassium chloride was given to her while in the ED and she was made aware that more potassium was being sent to the pharmacy for her to begin taking tomorrow.  She is to follow-up with her PCP for recheck of her potassium and encouraged to eat foods that are rich in potassium.  Patient was aware that her urinalysis had not resulted at the time she was ready to leave.  She is aware that she can see the results of this on MyChart.      Patient's presentation is most consistent with acute complicated illness / injury requiring diagnostic workup.  FINAL CLINICAL IMPRESSION(S) / ED DIAGNOSES   Final diagnoses:  Hypokalemia     Rx / DC Orders   ED Discharge Orders           Ordered    potassium chloride SA (KLOR-CON M) 20 MEQ tablet  Daily        12/08/21 1241             Note:  This document was prepared using Dragon voice recognition software and may include unintentional dictation errors.   Tommi Rumps, PA-C 12/08/21 1520    Chesley Noon, MD 12/08/21 1546

## 2021-12-08 NOTE — ED Triage Notes (Addendum)
Pt states convulsions in arms and legs radiating to back. Started yesterday afternoon. Pt describes legs buckling but no reported fall. Pt denies hx of seizure or injury. Spasms noted in legs and arms.

## 2021-12-08 NOTE — ED Notes (Signed)
Pt. Up to bathroom with stand-by assist. NAD. Gait steady.

## 2022-01-01 ENCOUNTER — Observation Stay: Payer: Medicaid Other

## 2022-01-01 ENCOUNTER — Inpatient Hospital Stay
Admission: EM | Admit: 2022-01-01 | Discharge: 2022-01-13 | DRG: 356 | Disposition: A | Discharge status: Home / Self Care | Payer: Medicaid Other | Attending: Student | Admitting: Student

## 2022-01-01 ENCOUNTER — Other Ambulatory Visit: Payer: Self-pay

## 2022-01-01 DIAGNOSIS — Y838 Other surgical procedures as the cause of abnormal reaction of the patient, or of later complication, without mention of misadventure at the time of the procedure: Secondary | ICD-10-CM | POA: Diagnosis not present

## 2022-01-01 DIAGNOSIS — J45909 Unspecified asthma, uncomplicated: Secondary | ICD-10-CM | POA: Diagnosis present

## 2022-01-01 DIAGNOSIS — Z79891 Long term (current) use of opiate analgesic: Secondary | ICD-10-CM

## 2022-01-01 DIAGNOSIS — E039 Hypothyroidism, unspecified: Secondary | ICD-10-CM | POA: Diagnosis not present

## 2022-01-01 DIAGNOSIS — Z882 Allergy status to sulfonamides status: Secondary | ICD-10-CM

## 2022-01-01 DIAGNOSIS — E861 Hypovolemia: Secondary | ICD-10-CM | POA: Diagnosis present

## 2022-01-01 DIAGNOSIS — E876 Hypokalemia: Secondary | ICD-10-CM | POA: Diagnosis not present

## 2022-01-01 DIAGNOSIS — K529 Noninfective gastroenteritis and colitis, unspecified: Secondary | ICD-10-CM | POA: Diagnosis not present

## 2022-01-01 DIAGNOSIS — G894 Chronic pain syndrome: Secondary | ICD-10-CM | POA: Diagnosis present

## 2022-01-01 DIAGNOSIS — E785 Hyperlipidemia, unspecified: Secondary | ICD-10-CM | POA: Diagnosis present

## 2022-01-01 DIAGNOSIS — E669 Obesity, unspecified: Secondary | ICD-10-CM | POA: Diagnosis present

## 2022-01-01 DIAGNOSIS — R112 Nausea with vomiting, unspecified: Secondary | ICD-10-CM

## 2022-01-01 DIAGNOSIS — K654 Sclerosing mesenteritis: Principal | ICD-10-CM | POA: Diagnosis present

## 2022-01-01 DIAGNOSIS — Z79899 Other long term (current) drug therapy: Secondary | ICD-10-CM

## 2022-01-01 DIAGNOSIS — E860 Dehydration: Secondary | ICD-10-CM | POA: Diagnosis present

## 2022-01-01 DIAGNOSIS — Z8249 Family history of ischemic heart disease and other diseases of the circulatory system: Secondary | ICD-10-CM

## 2022-01-01 DIAGNOSIS — E119 Type 2 diabetes mellitus without complications: Secondary | ICD-10-CM | POA: Diagnosis present

## 2022-01-01 DIAGNOSIS — N179 Acute kidney failure, unspecified: Secondary | ICD-10-CM | POA: Diagnosis present

## 2022-01-01 DIAGNOSIS — Z1152 Encounter for screening for COVID-19: Secondary | ICD-10-CM

## 2022-01-01 DIAGNOSIS — Z9884 Bariatric surgery status: Secondary | ICD-10-CM

## 2022-01-01 DIAGNOSIS — K651 Peritoneal abscess: Secondary | ICD-10-CM | POA: Diagnosis present

## 2022-01-01 DIAGNOSIS — K7200 Acute and subacute hepatic failure without coma: Secondary | ICD-10-CM | POA: Diagnosis not present

## 2022-01-01 DIAGNOSIS — F319 Bipolar disorder, unspecified: Secondary | ICD-10-CM | POA: Diagnosis present

## 2022-01-01 DIAGNOSIS — Z6838 Body mass index (BMI) 38.0-38.9, adult: Secondary | ICD-10-CM

## 2022-01-01 DIAGNOSIS — F411 Generalized anxiety disorder: Secondary | ICD-10-CM | POA: Diagnosis present

## 2022-01-01 DIAGNOSIS — I10 Essential (primary) hypertension: Secondary | ICD-10-CM | POA: Diagnosis present

## 2022-01-01 DIAGNOSIS — M62838 Other muscle spasm: Secondary | ICD-10-CM | POA: Diagnosis present

## 2022-01-01 DIAGNOSIS — R531 Weakness: Principal | ICD-10-CM

## 2022-01-01 DIAGNOSIS — Z885 Allergy status to narcotic agent status: Secondary | ICD-10-CM

## 2022-01-01 DIAGNOSIS — L7632 Postprocedural hematoma of skin and subcutaneous tissue following other procedure: Secondary | ICD-10-CM | POA: Diagnosis not present

## 2022-01-01 DIAGNOSIS — Z79890 Hormone replacement therapy (postmenopausal): Secondary | ICD-10-CM

## 2022-01-01 DIAGNOSIS — F250 Schizoaffective disorder, bipolar type: Secondary | ICD-10-CM | POA: Diagnosis present

## 2022-01-01 DIAGNOSIS — T39395A Adverse effect of other nonsteroidal anti-inflammatory drugs [NSAID], initial encounter: Secondary | ICD-10-CM | POA: Diagnosis present

## 2022-01-01 DIAGNOSIS — I5032 Chronic diastolic (congestive) heart failure: Secondary | ICD-10-CM | POA: Diagnosis present

## 2022-01-01 DIAGNOSIS — I110 Hypertensive heart disease with heart failure: Secondary | ICD-10-CM | POA: Diagnosis present

## 2022-01-01 DIAGNOSIS — I959 Hypotension, unspecified: Secondary | ICD-10-CM | POA: Diagnosis not present

## 2022-01-01 DIAGNOSIS — Z880 Allergy status to penicillin: Secondary | ICD-10-CM

## 2022-01-01 DIAGNOSIS — M797 Fibromyalgia: Secondary | ICD-10-CM | POA: Diagnosis present

## 2022-01-01 LAB — URINALYSIS, ROUTINE W REFLEX MICROSCOPIC
Bilirubin Urine: NEGATIVE
Glucose, UA: NEGATIVE mg/dL
Hgb urine dipstick: NEGATIVE
Ketones, ur: NEGATIVE mg/dL
Nitrite: NEGATIVE
Protein, ur: 30 mg/dL — AB
Specific Gravity, Urine: 1.014 (ref 1.005–1.030)
WBC, UA: >50 WBC/hpf — ABNORMAL HIGH (ref 0–5)
pH: 5 (ref 5.0–8.0)

## 2022-01-01 LAB — CBC
HCT: 41.4 % (ref 36.0–46.0)
Hemoglobin: 12.9 g/dL (ref 12.0–15.0)
MCH: 27.9 pg (ref 26.0–34.0)
MCHC: 31.2 g/dL (ref 30.0–36.0)
MCV: 89.6 fL (ref 80.0–100.0)
Platelets: 239 10*3/uL (ref 150–400)
RBC: 4.62 MIL/uL (ref 3.87–5.11)
RDW: 14.8 % (ref 11.5–15.5)
WBC: 11.2 10*3/uL — ABNORMAL HIGH (ref 4.0–10.5)
nRBC: 0 % (ref 0.0–0.2)

## 2022-01-01 LAB — BASIC METABOLIC PANEL
Anion gap: 13 (ref 5–15)
BUN: 29 mg/dL — ABNORMAL HIGH (ref 6–20)
CO2: 23 mmol/L (ref 22–32)
Calcium: 8.9 mg/dL (ref 8.9–10.3)
Chloride: 102 mmol/L (ref 98–111)
Creatinine, Ser: 2.11 mg/dL — ABNORMAL HIGH (ref 0.44–1.00)
GFR, Estimated: 27 mL/min — ABNORMAL LOW (ref >60)
Glucose, Bld: 120 mg/dL — ABNORMAL HIGH (ref 70–99)
Potassium: 3.1 mmol/L — ABNORMAL LOW (ref 3.5–5.1)
Sodium: 138 mmol/L (ref 135–145)

## 2022-01-01 LAB — TSH: TSH: 0.051 u[IU]/mL — ABNORMAL LOW (ref 0.350–4.500)

## 2022-01-01 LAB — RESP PANEL BY RT-PCR (FLU A&B, COVID) ARPGX2
Influenza A by PCR: NEGATIVE
Influenza B by PCR: NEGATIVE
SARS Coronavirus 2 by RT PCR: NEGATIVE

## 2022-01-01 LAB — CK: Total CK: 31 U/L — ABNORMAL LOW (ref 38–234)

## 2022-01-01 LAB — MAGNESIUM: Magnesium: 1.8 mg/dL (ref 1.7–2.4)

## 2022-01-01 MED ORDER — ONDANSETRON HCL 4 MG PO TABS
4.0000 mg | ORAL_TABLET | Freq: Four times a day (QID) | ORAL | Status: DC | PRN
  Administered 2022-01-05 – 2022-01-13 (×10): 4 mg via ORAL
  Filled 2022-01-01 (×10): qty 1

## 2022-01-01 MED ORDER — POLYETHYLENE GLYCOL 3350 17 G PO PACK: 17.0000 g | PACK | Freq: Every day | ORAL | Status: DC | PRN

## 2022-01-01 MED ORDER — ALBUTEROL SULFATE (2.5 MG/3ML) 0.083% IN NEBU: 2.5000 mg | INHALATION_SOLUTION | Freq: Four times a day (QID) | RESPIRATORY_TRACT | Status: DC | PRN

## 2022-01-01 MED ORDER — ENOXAPARIN SODIUM 60 MG/0.6ML IJ SOSY: 0.5000 mg/kg | PREFILLED_SYRINGE | INTRAMUSCULAR | Status: DC

## 2022-01-01 MED ORDER — ACETAMINOPHEN 325 MG PO TABS
650.0000 mg | ORAL_TABLET | Freq: Four times a day (QID) | ORAL | Status: DC | PRN
  Administered 2022-01-01 – 2022-01-02 (×3): 650 mg via ORAL
  Filled 2022-01-01 (×3): qty 2

## 2022-01-01 MED ORDER — ACETAMINOPHEN 650 MG RE SUPP: 650.0000 mg | Freq: Four times a day (QID) | RECTAL | Status: DC | PRN

## 2022-01-01 MED ORDER — ENOXAPARIN SODIUM 60 MG/0.6ML IJ SOSY
0.5000 mg/kg | PREFILLED_SYRINGE | INTRAMUSCULAR | Status: DC
  Administered 2022-01-01 – 2022-01-12 (×12): 45 mg via SUBCUTANEOUS
  Filled 2022-01-01 (×11): qty 0.6

## 2022-01-01 MED ORDER — QUETIAPINE FUMARATE 200 MG PO TABS
400.0000 mg | ORAL_TABLET | Freq: Every day | ORAL | Status: DC
  Administered 2022-01-01: 400 mg via ORAL
  Filled 2022-01-01: qty 2

## 2022-01-01 MED ORDER — ONDANSETRON HCL 4 MG/2ML IJ SOLN
4.0000 mg | Freq: Four times a day (QID) | INTRAMUSCULAR | Status: DC | PRN
  Administered 2022-01-02 – 2022-01-12 (×15): 4 mg via INTRAVENOUS
  Filled 2022-01-01 (×16): qty 2

## 2022-01-01 MED ORDER — BUSPIRONE HCL 10 MG PO TABS
15.0000 mg | ORAL_TABLET | Freq: Three times a day (TID) | ORAL | Status: DC | PRN
  Administered 2022-01-04 – 2022-01-05 (×2): 15 mg via ORAL
  Filled 2022-01-01 (×2): qty 2

## 2022-01-01 MED ORDER — POTASSIUM CHLORIDE 10 MEQ/100ML IV SOLN
10.0000 meq | Freq: Once | INTRAVENOUS | Status: AC
  Administered 2022-01-01: 10 meq via INTRAVENOUS
  Filled 2022-01-01 (×2): qty 100

## 2022-01-01 MED ORDER — POTASSIUM CHLORIDE CRYS ER 20 MEQ PO TBCR
40.0000 meq | EXTENDED_RELEASE_TABLET | Freq: Once | ORAL | Status: AC
  Administered 2022-01-01: 40 meq via ORAL
  Filled 2022-01-01: qty 2

## 2022-01-01 MED ORDER — INSULIN ASPART 100 UNIT/ML IJ SOLN
0.0000 [IU] | Freq: Three times a day (TID) | INTRAMUSCULAR | Status: DC
  Administered 2022-01-02 – 2022-01-06 (×5): 1 [IU] via SUBCUTANEOUS
  Administered 2022-01-07: 2 [IU] via SUBCUTANEOUS
  Filled 2022-01-01 (×7): qty 1

## 2022-01-01 MED ORDER — LACTATED RINGERS IV SOLN: INTRAVENOUS | Status: AC

## 2022-01-01 MED ORDER — PROPRANOLOL HCL 20 MG PO TABS
20.0000 mg | ORAL_TABLET | Freq: Two times a day (BID) | ORAL | Status: DC
  Filled 2022-01-01: qty 1

## 2022-01-01 NOTE — ED Provider Notes (Signed)
[facility] Provider Note    Event Date/Time   First MD Initiated Contact with Patient 01/01/22 1422     (approximate)  History   Chief Complaint: Weakness  HPI  Rhonda Palmer is a 51 y.o. female with a past medical history of anxiety, bipolar, diabetes, hypertension, presents to the emergency department for generalized weakness.  According to the patient for the past 2 days she has been feeling very weak, yesterday she had several episodes of nausea vomiting as well as diarrhea.  Today she states weakness to the point that her legs give out from under her and she has had twitching in all 4 extremities.  Patient states she has had similar findings in the past when her potassium has dropped very low.  Patient denies any abdominal pain denies any chest pain shortness of breath or cough.  Physical Exam   Triage Vital Signs: ED Triage Vitals  Enc Vitals Group     BP 01/01/22 1133 (!) 137/108     Pulse Rate 01/01/22 1133 91     Resp 01/01/22 1133 18     Temp 01/01/22 1133 98.7 F (37.1 C)     Temp src --      SpO2 01/01/22 1133 99 %     Weight --      Height --      Head Circumference --      Peak Flow --      Pain Score 01/01/22 1132 7     Pain Loc --      Pain Edu? --      Excl. in GC? --     Most recent vital signs: Vitals:   01/01/22 1133  BP: (!) 137/108  Pulse: 91  Resp: 18  Temp: 98.7 F (37.1 C)  SpO2: 99%    General: Awake, no distress.  CV:  Good peripheral perfusion.  Regular rate and rhythm  Resp:  Normal effort.  Equal breath sounds bilaterally.  Abd:  No distention.  Soft, nontender.  No rebound or guarding.  ED Results / Procedures / Treatments   MEDICATIONS ORDERED IN ED: Medications  potassium chloride SA (KLOR-CON M) CR tablet 40 mEq (has no administration in time range)  potassium chloride 10 mEq in 100 mL IVPB (has no administration in time range)   EKG viewed and interpreted by myself shows a normal sinus rhythm  at 74 bpm with a narrow QRS, normal axis, normal intervals, nonspecific but no concerning ST changes.   IMPRESSION / MDM / ASSESSMENT AND PLAN / ED COURSE  I reviewed the triage vital signs and the nursing notes.  Patient's presentation is most consistent with acute presentation with potential threat to life or bodily function.  Patient presents emergency department for generalized weakness.  States her legs give out from under her earlier today and she has been experiencing twitching and jerking in her extremities.  Patient is lab work today shows acute renal insufficiency.  Baseline creatinine around 0.6 currently 2.1 greater than 3 times baseline.  Patient's potassium also borderline low at 3.1 though not significantly diminished.  For the patient's symptoms we will IV and orally replete the potassium.  I have added on a magnesium level as well.  Patient CBC is reassuring.  However given the patient has diminished renal function generalized weakness to the point where her legs give out from her we will admit to the hospitalist service for IV rehydration and hopefully improve renal function.  FINAL  CLINICAL IMPRESSION(S) / ED DIAGNOSES   Weakness Acute renal insufficiency Hypokalemia   Note:  This document was prepared using Dragon voice recognition software and may include unintentional dictation errors.   Harvest Dark, MD 01/01/22 1521

## 2022-01-01 NOTE — Assessment & Plan Note (Signed)
Patient states that she had 1 day of vomiting and diarrhea that was nonbloody, nonmelanotic.  She has not had any additional episodes today.  Likely viral in nature.  - Will obtain GI panel if continued diarrhea occurs - Supportive management with Zofran

## 2022-01-01 NOTE — ED Notes (Signed)
Report given to Lauren RN.

## 2022-01-01 NOTE — H&P (Signed)
History and Physical    Patient: Rhonda Palmer SWF:[id number] DOB: 01-04-1971 DOA: 01/01/2022 DOS: the patient was seen and examined on 01/01/2022 PCP: Franciso Bend, NP  Patient coming from: Home  Chief Complaint:  Chief Complaint  Patient presents with   Weakness   HPI: Rhonda Palmer is a 51 y.o. female with medical history significant of hypertension, hyperlipidemia, hypothyroidism, asthma, depression, anxiety, schizoaffective disorder, fibromyalgia, diastolic heart failure, who presents to the ED with complaints of generalized weakness.  Rhonda Palmer states that 2 days ago, she developed generalized malaise but denied any other symptoms at that time.  Yesterday, she developed nausea, vomiting and diarrhea.  She had approximately 3 episodes of vomiting but she did not see what color the emesis was.  In addition, she had 3 episodes of diarrhea that was nonbloody, nonmelanotic.  Since yesterday, she has not had any additional episodes.  She denies any fever, chills, cough, shortness of breath, palpitations, chest pain.  She has not noticed today she has not urinated as often after taking her torsemide.  She endorses generalized weakness but denies any focal weakness.  In addition, she feels that her muscles are spasming and ache.  ED course: On arrival to the ED, patient was normotensive at 137/108 with heart rate of 91.  She was afebrile at 98.7.  She was saturating at 99% on room air. Initial workup remarkable for WBC of 11.2, potassium of 3.1, BUN of 29, creatinine of 2.11.  COVID-19 PCR and influenza PCR negative.  Urinalysis with no acute findings.  Due to symptomatic hypokalemia and AKI, [facility] contacted for admission.  Review of Systems: As mentioned in the history of present illness. All other systems reviewed and are negative. Past Medical History:  Diagnosis Date   (HFpEF) heart failure with preserved ejection fraction (HCC)    a. 02/2020 Echo: EF 55-60%, no rwma. Nl RV fxn.    Anxiety    Asthma    Bipolar 1 disorder (HCC)    Diabetes mellitus without complication (HCC)    Edema leg for twenty years per patient   taking lasix for edema   Fibromyalgia    History of cardiac catheterization    a. 02/2020 Cath: Nl cors. LVEDP -> torsemide increased to 40mg  daily.   Hypertension    Hypothyroidism    Morbid obesity (HCC)    Ovarian cyst    Palpitations    a. 03/2020 Zio: RSR, avg HR 84 (55-203), occas SVT runs. No significant arrhythmias.  Most triggered events associate with sinus rhythm.   Schizo affective schizophrenia Rhonda Palmer)    Past Surgical History:  Procedure Laterality Date   ABDOMINAL HYSTERECTOMY     ANKLE SURGERY     pt. states long time ago   LEFT HEART CATH AND CORONARY ANGIOGRAPHY N/A 03/13/2020   Procedure: LEFT HEART CATH AND CORONARY ANGIOGRAPHY;  Surgeon: 03/15/2020, MD;  Location: [facility] INVASIVE CV LAB;  Service: Cardiovascular;  Laterality: N/A;   Social History:  reports that she has never smoked. She has never used smokeless tobacco. She reports that she does not drink alcohol and does not use drugs.  Allergies  Allergen Reactions   Codeine Hives, Itching, Rash and Swelling   Sulfa Antibiotics Anaphylaxis   Penicillins Itching    Family History  Problem Relation Age of Onset   Heart failure Mother    Bladder Cancer Neg Hx    Kidney cancer Neg Hx     Prior to Admission medications  Medication Sig Start Date End Date Taking? Authorizing Provider  albuterol (PROVENTIL HFA;VENTOLIN HFA) 108 (90 Base) MCG/ACT inhaler Inhale 2 puffs into the lungs every 6 (six) hours as needed for wheezing or shortness of breath.    [provider]  ARISTADA [phone] MG/3.9ML prefilled syringe Inject 1,064 mg into the muscle every 8 (eight) weeks. Patient not taking: Reported on 01/11/2021 10/31/20   [provider]  clonazePAM (KLONOPIN) 0.5 MG tablet Take 0.5 mg by mouth 3 (three) times daily.  02/18/18   [provider]  levothyroxine (SYNTHROID) 175 MCG tablet Take 2 tablets (350 mcg total) by mouth daily at 6 (six) AM. 01/16/21 02/15/21  Delfino Lovett, MD  lisinopril (ZESTRIL) 10 MG tablet Take 10 mg by mouth daily. 12/10/20   [provider]  loperamide (IMODIUM A-D) 2 MG tablet Take 1 tablet (2 mg total) by mouth 4 (four) times daily as needed for diarrhea or loose stools. 01/15/21   Delfino Lovett, MD  potassium chloride SA (KLOR-CON M) 20 MEQ tablet Take 1 tablet (20 mEq total) by mouth daily. 12/08/21   Tommi Rumps, PA-C  QUEtiapine (SEROQUEL) 300 MG tablet Take 600 mg by mouth at bedtime. 02/16/20   [provider]  simvastatin (ZOCOR) 20 MG tablet Take 20 mg by mouth at bedtime. 12/10/20   [provider]  sucralfate (CARAFATE) 1 GM/10ML suspension Take 10 mLs (1 g total) by mouth 4 (four) times daily -  with meals and at bedtime. 01/15/21   Delfino Lovett, MD  zolpidem (AMBIEN) 5 MG tablet Take 5 mg by mouth at bedtime as needed. 03/05/20   [provider]    Physical Exam: Vitals:   01/01/22 1133 01/01/22 1629  BP: (!) 137/108   Pulse: 91   Resp: 18   Temp: 98.7 F (37.1 C)   SpO2: 99%   Weight:  89.8 kg   Physical Exam Vitals and nursing note reviewed.  Constitutional:      General: She is not in acute distress.    Appearance: She is obese. She is not toxic-appearing.  HENT:     Head: Normocephalic and atraumatic.     Mouth/Throat:     Mouth: Mucous membranes are moist.     Pharynx: Oropharynx is clear.  Eyes:     Extraocular Movements: Extraocular movements intact.     Conjunctiva/sclera: Conjunctivae normal.     Pupils: Pupils are equal, round, and reactive to light.  Cardiovascular:     Rate and Rhythm: Normal rate and regular rhythm.     Heart sounds: No murmur heard.    No gallop.  Pulmonary:     Effort: Pulmonary effort is normal. No respiratory distress.     Breath sounds: Normal breath sounds. No wheezing, rhonchi or rales.  Abdominal:      General: Bowel sounds are normal. There is no distension.     Palpations: Abdomen is soft.     Tenderness: There is no abdominal tenderness.  Musculoskeletal:     Cervical back: Neck supple.     Right lower leg: No edema.     Left lower leg: No edema.  Skin:    General: Skin is warm and dry.  Neurological:     General: No focal deficit present.     Mental Status: She is alert and oriented to person, place, and time. Mental status is at baseline.     Cranial Nerves: No cranial nerve deficit.     Motor: No weakness.  Psychiatric:        Mood and Affect: Mood normal.        Behavior: Behavior normal.    Data Reviewed: CBC with WBC of 11.2, hemoglobin of 12.9, platelets of 239.At [facility] with potassium of 3.1, bicarb of 23, glucose of 120, BUN of 29, creatinine of 2.11, GFR of 27.  Anion gap 13.  Magnesium 1.8.  Influenza PCR and COVID-19 PCR negative.  EKG personally reviewed.  Sinus rhythm with rate of 74.  Compared to EKG obtained in December 2022, no changes.  Results are pending, will review when available.  Assessment and Plan: * Acute kidney injury [facility]) Patient presenting with AKI with creatinine of 2 in the setting of GI illness yesterday.  Most likely prerenal in the setting of dehydration in addition to continued use of home nephrotoxic agents including torsemide and lisinopril.  Given muscle spasms, differential also includes rhabdomyolysis.  CK still pending.  - Monitor urine output - Trend creatinine - Renal ultrasound - Urinalysis - CK pending - IV fluid resuscitation  Acute gastroenteritis Patient states that she had 1 day of vomiting and diarrhea that was nonbloody, nonmelanotic.  She has not had any additional episodes today.  Likely viral in nature.  - Will obtain GI panel if continued diarrhea occurs - Supportive management with Zofran  Hypokalemia Per chart review, patient has a history of chronic hypokalemia, likely exacerbated in the setting of GI  illness.  Potassium today 3.1.  No EKG changes.  - PO and IV replacement ordered - Repeat BMP this evening  Hypothyroidism Patient is on an exceptionally high dose of Synthroid.  Will check TSH today prior to restarting.  - TSH pending - If within normal range, restart home Synthroid tomorrow morning  Hypertension - Holding home lisinopril and torsemide in the setting of AKI  Diabetes mellitus without complication (HCC) - Hold home medications - SSI, sensitive given poor p.o. intake  Advance Care Planning:   Code Status: Full Code   Consults: None  Family Communication: No family at bedside.  Severity of Illness: The appropriate patient status for this patient is OBSERVATION. Observation status is judged to be reasonable and necessary in order to provide the required intensity of service to ensure the patient's safety. The patient's presenting symptoms, physical exam findings, and initial radiographic and laboratory data in the context of their medical condition is felt to place them at decreased risk for further clinical deterioration. Furthermore, it is anticipated that the patient will be medically stable for discharge from the hospital within 2 midnights of admission.   Author: Verdene Lennert, MD 01/01/2022 5:25 PM  For on call review [email or web address].

## 2022-01-01 NOTE — Assessment & Plan Note (Addendum)
Per chart review, patient has a history of chronic hypokalemia, likely exacerbated in the setting of GI illness.  Potassium today 3.1.  No EKG changes.  - PO and IV replacement ordered - Repeat BMP this evening

## 2022-01-01 NOTE — Assessment & Plan Note (Signed)
-   Holding home lisinopril and torsemide in the setting of AKI

## 2022-01-01 NOTE — Assessment & Plan Note (Signed)
Patient presenting with AKI with creatinine of 2 in the setting of GI illness yesterday.  Most likely prerenal in the setting of dehydration in addition to continued use of home nephrotoxic agents including torsemide and lisinopril.  Given muscle spasms, differential also includes rhabdomyolysis.  CK still pending.  - Monitor urine output - Trend creatinine - Renal ultrasound - Urinalysis - CK pending - IV fluid resuscitation

## 2022-01-01 NOTE — Assessment & Plan Note (Addendum)
-   Hold home medications - SSI, sensitive given poor p.o. intake

## 2022-01-01 NOTE — ED Notes (Signed)
Pt ambulates to restroom with steady gait.

## 2022-01-01 NOTE — Assessment & Plan Note (Signed)
Patient is on an exceptionally high dose of Synthroid.  Will check TSH today prior to restarting.  - TSH pending - If within normal range, restart home Synthroid tomorrow morning

## 2022-01-01 NOTE — ED Triage Notes (Addendum)
Pt comes with c/o feeling weak that started today. Pt states she just felt shakey in arms and legs. Pt state pain in legs. Pt denies any dizziness.  Pt had this happen recently and was seen in ED for same.

## 2022-01-02 ENCOUNTER — Observation Stay: Payer: Medicaid Other

## 2022-01-02 ENCOUNTER — Encounter: Payer: Self-pay | Admitting: Internal Medicine

## 2022-01-02 DIAGNOSIS — I959 Hypotension, unspecified: Secondary | ICD-10-CM | POA: Diagnosis not present

## 2022-01-02 DIAGNOSIS — E119 Type 2 diabetes mellitus without complications: Secondary | ICD-10-CM | POA: Diagnosis present

## 2022-01-02 DIAGNOSIS — N179 Acute kidney failure, unspecified: Secondary | ICD-10-CM | POA: Diagnosis present

## 2022-01-02 DIAGNOSIS — Z79891 Long term (current) use of opiate analgesic: Secondary | ICD-10-CM | POA: Diagnosis not present

## 2022-01-02 DIAGNOSIS — R531 Weakness: Secondary | ICD-10-CM | POA: Diagnosis present

## 2022-01-02 DIAGNOSIS — K529 Noninfective gastroenteritis and colitis, unspecified: Secondary | ICD-10-CM | POA: Diagnosis present

## 2022-01-02 DIAGNOSIS — E039 Hypothyroidism, unspecified: Secondary | ICD-10-CM | POA: Diagnosis present

## 2022-01-02 DIAGNOSIS — I110 Hypertensive heart disease with heart failure: Secondary | ICD-10-CM | POA: Diagnosis present

## 2022-01-02 DIAGNOSIS — E876 Hypokalemia: Secondary | ICD-10-CM | POA: Diagnosis present

## 2022-01-02 DIAGNOSIS — R112 Nausea with vomiting, unspecified: Secondary | ICD-10-CM | POA: Diagnosis not present

## 2022-01-02 DIAGNOSIS — Z9884 Bariatric surgery status: Secondary | ICD-10-CM | POA: Diagnosis not present

## 2022-01-02 DIAGNOSIS — G894 Chronic pain syndrome: Secondary | ICD-10-CM | POA: Diagnosis present

## 2022-01-02 DIAGNOSIS — F250 Schizoaffective disorder, bipolar type: Secondary | ICD-10-CM | POA: Diagnosis present

## 2022-01-02 DIAGNOSIS — E785 Hyperlipidemia, unspecified: Secondary | ICD-10-CM | POA: Diagnosis present

## 2022-01-02 DIAGNOSIS — K7200 Acute and subacute hepatic failure without coma: Secondary | ICD-10-CM | POA: Diagnosis not present

## 2022-01-02 DIAGNOSIS — L7632 Postprocedural hematoma of skin and subcutaneous tissue following other procedure: Secondary | ICD-10-CM | POA: Diagnosis not present

## 2022-01-02 DIAGNOSIS — I5032 Chronic diastolic (congestive) heart failure: Secondary | ICD-10-CM | POA: Diagnosis present

## 2022-01-02 DIAGNOSIS — Z1152 Encounter for screening for COVID-19: Secondary | ICD-10-CM | POA: Diagnosis not present

## 2022-01-02 DIAGNOSIS — E1169 Type 2 diabetes mellitus with other specified complication: Secondary | ICD-10-CM | POA: Diagnosis not present

## 2022-01-02 DIAGNOSIS — J45909 Unspecified asthma, uncomplicated: Secondary | ICD-10-CM | POA: Diagnosis present

## 2022-01-02 DIAGNOSIS — K654 Sclerosing mesenteritis: Secondary | ICD-10-CM | POA: Diagnosis present

## 2022-01-02 DIAGNOSIS — Y838 Other surgical procedures as the cause of abnormal reaction of the patient, or of later complication, without mention of misadventure at the time of the procedure: Secondary | ICD-10-CM | POA: Diagnosis not present

## 2022-01-02 DIAGNOSIS — Z79899 Other long term (current) drug therapy: Secondary | ICD-10-CM | POA: Diagnosis not present

## 2022-01-02 DIAGNOSIS — K651 Peritoneal abscess: Secondary | ICD-10-CM | POA: Diagnosis present

## 2022-01-02 DIAGNOSIS — E860 Dehydration: Secondary | ICD-10-CM | POA: Diagnosis present

## 2022-01-02 DIAGNOSIS — Z885 Allergy status to narcotic agent status: Secondary | ICD-10-CM | POA: Diagnosis not present

## 2022-01-02 DIAGNOSIS — M797 Fibromyalgia: Secondary | ICD-10-CM | POA: Diagnosis present

## 2022-01-02 LAB — CBG MONITORING, ED
Glucose-Capillary: 103 mg/dL — ABNORMAL HIGH (ref 70–99)
Glucose-Capillary: 85 mg/dL (ref 70–99)
Glucose-Capillary: 125 mg/dL — ABNORMAL HIGH (ref 70–99)
Glucose-Capillary: 127 mg/dL — ABNORMAL HIGH (ref 70–99)
Glucose-Capillary: 99 mg/dL (ref 70–99)

## 2022-01-02 LAB — COMPREHENSIVE METABOLIC PANEL
ALT: 237 U/L — ABNORMAL HIGH (ref 0–44)
AST: 243 U/L — ABNORMAL HIGH (ref 15–41)
Albumin: 2.4 g/dL — ABNORMAL LOW (ref 3.5–5.0)
Alkaline Phosphatase: 223 U/L — ABNORMAL HIGH (ref 38–126)
Anion gap: 6 (ref 5–15)
BUN: 33 mg/dL — ABNORMAL HIGH (ref 6–20)
CO2: 26 mmol/L (ref 22–32)
Calcium: 8.3 mg/dL — ABNORMAL LOW (ref 8.9–10.3)
Chloride: 104 mmol/L (ref 98–111)
Creatinine, Ser: 1.82 mg/dL — ABNORMAL HIGH (ref 0.44–1.00)
GFR, Estimated: 33 mL/min — ABNORMAL LOW (ref >60)
Glucose, Bld: 99 mg/dL (ref 70–99)
Potassium: 3.3 mmol/L — ABNORMAL LOW (ref 3.5–5.1)
Sodium: 136 mmol/L (ref 135–145)
Total Bilirubin: 1.9 mg/dL — ABNORMAL HIGH (ref 0.3–1.2)
Total Protein: 5 g/dL — ABNORMAL LOW (ref 6.5–8.1)

## 2022-01-02 LAB — BILIRUBIN, FRACTIONATED(TOT/DIR/INDIR)
Bilirubin, Direct: 0.6 mg/dL — ABNORMAL HIGH (ref 0.0–0.2)
Indirect Bilirubin: 1.3 mg/dL — ABNORMAL HIGH (ref 0.3–0.9)
Total Bilirubin: 1.9 mg/dL — ABNORMAL HIGH (ref 0.3–1.2)

## 2022-01-02 LAB — BLOOD GAS, VENOUS
Acid-base deficit: 6.8 mmol/L — ABNORMAL HIGH (ref 0.0–2.0)
Bicarbonate: 19.7 mmol/L — ABNORMAL LOW (ref 20.0–28.0)
O2 Saturation: 86 %
Patient temperature: 37
pCO2, Ven: 43 mmHg — ABNORMAL LOW (ref 44–60)
pH, Ven: 7.27 (ref 7.25–7.43)
pO2, Ven: 56 mmHg — ABNORMAL HIGH (ref 32–45)

## 2022-01-02 LAB — HIV ANTIBODY (ROUTINE TESTING W REFLEX): HIV Screen 4th Generation wRfx: NONREACTIVE

## 2022-01-02 LAB — CBC
HCT: 31.6 % — ABNORMAL LOW (ref 36.0–46.0)
Hemoglobin: 9.9 g/dL — ABNORMAL LOW (ref 12.0–15.0)
MCH: 28.1 pg (ref 26.0–34.0)
MCHC: 31.3 g/dL (ref 30.0–36.0)
MCV: 89.8 fL (ref 80.0–100.0)
Platelets: 160 10*3/uL (ref 150–400)
RBC: 3.52 MIL/uL — ABNORMAL LOW (ref 3.87–5.11)
RDW: 14.7 % (ref 11.5–15.5)
WBC: 7.5 10*3/uL (ref 4.0–10.5)
nRBC: 0 % (ref 0.0–0.2)

## 2022-01-02 LAB — HEMOGLOBIN A1C
Hgb A1c MFr Bld: 5.8 % — ABNORMAL HIGH (ref 4.8–5.6)
Mean Plasma Glucose: 120 mg/dL

## 2022-01-02 LAB — MAGNESIUM: Magnesium: 1.9 mg/dL (ref 1.7–2.4)

## 2022-01-02 LAB — PROCALCITONIN: Procalcitonin: 0.75 ng/mL

## 2022-01-02 LAB — LACTIC ACID, PLASMA: Lactic Acid, Venous: 0.6 mmol/L (ref 0.5–1.9)

## 2022-01-02 LAB — AMMONIA: Ammonia: 14 umol/L (ref 9–35)

## 2022-01-02 MED ORDER — SODIUM CHLORIDE 0.9 % IV SOLN
250.0000 mL | INTRAVENOUS | Status: DC
  Administered 2022-01-02: 250 mL via INTRAVENOUS

## 2022-01-02 MED ORDER — SODIUM CHLORIDE 0.9 % IV BOLUS
1000.0000 mL | Freq: Once | INTRAVENOUS | Status: AC
  Administered 2022-01-02: 1000 mL via INTRAVENOUS

## 2022-01-02 MED ORDER — MELATONIN 5 MG PO TABS
5.0000 mg | ORAL_TABLET | Freq: Once | ORAL | Status: AC
  Administered 2022-01-02: 5 mg via ORAL
  Filled 2022-01-02: qty 1

## 2022-01-02 MED ORDER — POTASSIUM CHLORIDE 10 MEQ/100ML IV SOLN: 10.0000 meq | INTRAVENOUS | Status: DC

## 2022-01-02 MED ORDER — LACTATED RINGERS IV SOLN: INTRAVENOUS | Status: DC

## 2022-01-02 MED ORDER — HYDROXYZINE HCL 10 MG/5ML PO SYRP
50.0000 mg | ORAL_SOLUTION | Freq: Four times a day (QID) | ORAL | Status: DC | PRN
  Administered 2022-01-02: 50 mg via ORAL
  Filled 2022-01-02 (×2): qty 25

## 2022-01-02 MED ORDER — POTASSIUM CHLORIDE 10 MEQ/100ML IV SOLN
10.0000 meq | INTRAVENOUS | Status: AC
  Administered 2022-01-02 (×6): 10 meq via INTRAVENOUS
  Filled 2022-01-02 (×6): qty 100

## 2022-01-02 MED ORDER — LACTATED RINGERS IV BOLUS
1000.0000 mL | Freq: Once | INTRAVENOUS | Status: AC
  Administered 2022-01-02: 1000 mL via INTRAVENOUS

## 2022-01-02 MED ORDER — ALBUMIN HUMAN 25 % IV SOLN
25.0000 g | Freq: Four times a day (QID) | INTRAVENOUS | Status: AC
  Administered 2022-01-02 – 2022-01-03 (×4): 25 g via INTRAVENOUS
  Filled 2022-01-02 (×4): qty 100

## 2022-01-02 MED ORDER — ALBUMIN HUMAN 25 % IV SOLN
25.0000 g | Freq: Once | INTRAVENOUS | Status: AC
  Administered 2022-01-02: 25 g via INTRAVENOUS
  Filled 2022-01-02: qty 100

## 2022-01-02 MED ORDER — TRAZODONE HCL 50 MG PO TABS
50.0000 mg | ORAL_TABLET | Freq: Once | ORAL | Status: AC
  Administered 2022-01-02: 50 mg via ORAL
  Filled 2022-01-02: qty 1

## 2022-01-02 MED ORDER — MIDODRINE HCL 5 MG PO TABS
10.0000 mg | ORAL_TABLET | Freq: Once | ORAL | Status: AC
  Administered 2022-01-02: 10 mg via ORAL
  Filled 2022-01-02: qty 2

## 2022-01-02 MED ORDER — NOREPINEPHRINE 4 MG/250ML-% IV SOLN
2.0000 ug/min | INTRAVENOUS | Status: DC
  Administered 2022-01-02: 2 ug/min via INTRAVENOUS
  Filled 2022-01-02: qty 250

## 2022-01-02 NOTE — Progress Notes (Addendum)
       CROSS COVER NOTE  NAME: Rhonda Palmer MRN: [id number] DOB : [birth year]   HPI/Events of Note   Patient now off pressors. Still complaining of nausea and mid transverse abdominal pain Review of labs, procalcitonin elevated at 0.72. Lactic acid was within normal limits. K replaced this am No current maintenance IV fluids ordered.   Assessment and  Interventions   Assessment: General - pale, cheeks flushed, afebrile. BP 99/68 (78), heart rate 76 sats 96% on room air Plan: Repeat CMP - check for improvement in hepatic and renal function LR 125 Monitor resp status and white count with opacities on CT scan and elevated procal. Currently no resp symptoms Protonix 40 daily for SUP Hepatitis panel ordered early this am not done reordered for now       Donnie Mesa NP [facility]   Latest Reference Range & Units 01/02/22 22:53  Sodium 135 - 145 mmol/L 141  Potassium 3.5 - 5.1 mmol/L 4.1  Chloride 98 - 111 mmol/L 112 (H)  CO2 22 - 32 mmol/L 26  Glucose 70 - 99 mg/dL 92  BUN 6 - 20 mg/dL 17  Creatinine 4.68 - 0.32 mg/dL 1.22  Calcium 8.9 - 48.2 mg/dL 8.9  Anion gap 5 - 15  3 (L)  Alkaline Phosphatase 38 - 126 U/L 176 (H)  Albumin 3.5 - 5.0 g/dL 3.1 (L)  AST 15 - 41 U/L 90 (H)  ALT 0 - 44 U/L 159 (H)  Total Protein 6.5 - 8.1 g/dL 5.6 (L)  Total Bilirubin 0.3 - 1.2 mg/dL 1.0  GFR, Estimated >50 mL/min >60  (H): Data is abnormally high (L): Data is abnormally low  Improved renal and hepatic functiuon CT   Decrease IV fluid to 75/h  Robaxin and tylenol ineffective for  for pain relief - dose of fentanyl ordered

## 2022-01-02 NOTE — ED Notes (Addendum)
Notified attending P. Dibia that pt requesting something for nausea however was last given zofran at 1544 and is ordered PRN Q6. Attending states will order phenergan soon. Will give once order available.

## 2022-01-02 NOTE — ED Notes (Signed)
IV team remains at bedside 

## 2022-01-02 NOTE — ED Notes (Signed)
Pt received full dinner tray from female dietary team member now.

## 2022-01-02 NOTE — Progress Notes (Signed)
       CROSS COVER NOTE  NAME: Rhonda Palmer MRN: [id number] DOB : Aug 03, 1970    Date of Service   @DATE @  HPI/Events of Note   Patient became hypotensive and lethargic after receiving ordered seroquel. Patient then told nurse she has not been taking her seroquel at home for over a month. No improvement of BP after 1 liter LR, 1 liter NS and 25 gm albumin   Assessment and  Interventions   Assessment: Alery to voice, oriented SR 60's on monitor Pulm sats stable with O2 2L Abdomen mid transverse abdominal pain, unable to identify what makes it worse or better.  Reports to me ongoing for 3 days  Review of history labs - K low at 3.1 on admission, have no replacement orders found. History of QT prolongation - Last EKG done with admission workup in ED was 432  Latest Reference Range & Units 01/02/22 03:44  pH, Ven 7.25 - 7.43  7.27  pCO2, Ven 44 - 60 mmHg 43 (L)  pO2, Ven 32 - 45 mmHg 56 (H)  Acid-base deficit 0.0 - 2.0 mmol/L 6.8 (H)  Bicarbonate 20.0 - 28.0 mmol/L 19.7 (L)  O2 Saturation % 86  Patient temperature  37.0  Collection site  VEIN  COMPREHENSIVE METABOLIC PANEL  Rpt !  Sodium 135 - 145 mmol/L 136  Potassium 3.5 - 5.1 mmol/L 3.3 (L)  Chloride 98 - 111 mmol/L 104  CO2 22 - 32 mmol/L 26  Glucose 70 - 99 mg/dL 99  BUN 6 - 20 mg/dL 33 (H)  Creatinine 14/07/23 - 1.00 mg/dL 7.61 (H)  Calcium 8.9 - 10.3 mg/dL 8.3 (L)  Anion gap 5 - 15  6  Magnesium 1.7 - 2.4 mg/dL 1.9  Alkaline Phosphatase 38 - 126 U/L 223 (H)  Albumin 3.5 - 5.0 g/dL 2.4 (L)  AST 15 - 41 U/L 243 (H)  ALT 0 - 44 U/L 237 (H)  Total Protein 6.5 - 8.1 g/dL 5.0 (L)  Total Bilirubin 0.3 - 1.2 mg/dL 1.9 (H)  GFR, Estimated >60 mL/min 33 (L)  WBC 4.0 - 10.5 K/uL 7.5  RBC 3.87 - 5.11 MIL/uL 3.52 (L)  Hemoglobin 12.0 - 15.0 g/dL 9.9 (L)  HCT 6.07 - 37.1 % 31.6 (L)  MCV 80.0 - 100.0 fL 89.8  MCH 26.0 - 34.0 pg 28.1  MCHC 30.0 - 36.0 g/dL 06.2  RDW 69.4 - 85.4 % 14.7  Platelets 150 - 400 K/uL 160  nRBC  0.0 - 0.2 % 0.0  (L): Data is abnormally low (H): Data is abnormally high !: Data is abnormal Rpt: View report in Results Review for more information   Plan: Albumin 25 gm x1 then added every 6 Use of midodrine held due to Qtc prolongation history;low K; no current mag level - once repleted will add Discontinue seroquel VBG - cannot rule out hypoventilation with sedation and morbid obesiity Lactic acid, ammonia and procalcitonin, fractionated bili CT abdomen/pelvis Peripheral levo for now. - likely will not need high dose or for long      62.7 NP [facility]

## 2022-01-02 NOTE — Progress Notes (Signed)
An USGPIV (ultrasound guided PIV) has been placed for short-term vasopressor infusion. A correctly placed ivWatch must be used when administering Vasopressors. Should this treatment be needed beyond 72 hours, central line access should be obtained.  It will be the responsibility of the bedside nurse to follow best practice to prevent extravasations.   ?

## 2022-01-02 NOTE — Progress Notes (Signed)
Consultation Progress Note   Patient: Rhonda Palmer POE:[id number] DOB: 1970/03/05 DOA: 01/01/2022 DOS: the patient was seen and examined on 01/02/2022 Primary service: Anye Brose, Elgie Collard, MD  Brief hospital course: No notes on file  Assessment and Plan:  Hypotension Likely due to hypovolemia in setting of N/D. She is requiring Vasopressors-Norepi to maintain MAP>60 Discussed with RN to wean off as able. She remains on IV fluids @100cc /hr  Acute kidney injury [facility]) Patient presenting with AKI with creatinine of 2 in the setting of GI illness yesterday.  Most likely prerenal in the setting of dehydration in addition to continued use of home nephrotoxic agents including torsemide and lisinopril.  Given muscle spasms, differential also includes rhabdomyolysis.  CK still pending.  - Monitor urine output - Trend creatinine - Renal ultrasound - Urinalysis - CK pending - IV fluid resuscitation  12/7- Improved Scr with IVF  Acute gastroenteritis Patient states that she had 1 day of vomiting and diarrhea that was nonbloody, nonmelanotic.  She has not had any additional episodes today.  Likely viral in nature.  - Will obtain GI panel if continued diarrhea occurs - Supportive management with Zofran  12/7-No further episodes of N/D  Hypokalemia Per chart review, patient has a history of chronic hypokalemia, likely exacerbated in the setting of GI illness.  Potassium today 3.1.  No EKG changes.  - PO and IV replacement ordered - Repeat BMP this evening  Hypothyroidism Patient is on an exceptionally high dose of Synthroid. - TSH is low at 0.05 -Hold Synthroid.  Hypertension - Holding home lisinopril and torsemide in the setting of AKI  Diabetes mellitus without complication (HCC) - Hold home medications - SSI, sensitive given poor p.o. intake        [facility] will continue to follow the patient.  Subjective: Feels nauseous and tired.  Physical Exam: Constitutional:       General: She is not in acute distress.    Appearance: She is obese. She is not toxic-appearing.  HENT:     Head: Normocephalic and atraumatic.     Mouth/Throat:     Mouth: Mucous membranes are moist.     Pharynx: Oropharynx is clear.  Eyes:     Extraocular Movements: Extraocular movements intact.     Conjunctiva/sclera: Conjunctivae normal.     Pupils: Pupils are equal, round, and reactive to light.  Cardiovascular:     Rate and Rhythm: Normal rate and regular rhythm.     Heart sounds: No murmur heard.    No gallop.  Pulmonary:     Effort: Pulmonary effort is normal. No respiratory distress.     Breath sounds: Normal breath sounds. No wheezing, rhonchi or rales.  Abdominal:     General: Bowel sounds are normal. There is no distension.     Palpations: Abdomen is soft.     Tenderness: There is no abdominal tenderness.  Musculoskeletal:     Cervical back: Neck supple.     Right lower leg: No edema.     Left lower leg: No edema.  Skin:    General: Skin is warm and dry.  Neurological:     General: No focal deficit present.     Mental Status: She is alert and oriented to person, place, and time. Mental status is at baseline.     Cranial Nerves: No cranial nerve deficit.     Motor: No weakness.  Psychiatric:        Mood and Affect: Mood normal.  Behavior: Behavior normal.    Vitals:   01/02/22 0830 01/02/22 0900 01/02/22 0930 01/02/22 1000  BP: 105/71 109/66 124/73 106/66  Pulse: 64 85 95 70  Resp: 15 14 15 14   Temp:      TempSrc:      SpO2: 100% 100% 98% 100%  Weight:      Height:        Data Reviewed:  There are no new results to review at this time.  Family Communication: None at bedside  Time spent: 15 minutes.  Author: Cristela Felt, MD 01/02/2022 12:01 PM  For on call review [email or web address].

## 2022-01-02 NOTE — ED Notes (Signed)
Pt updated about hydroxyzine and understands will be given dose as soon as it is received from pharm.

## 2022-01-02 NOTE — ED Notes (Addendum)
Female dietary team member stopped by nurses station and reported he had a tray for a patient who recently moved to room 30. Directed him to room 30. He took tray with him into room. This RN checked on pt moments later yet didn't see tray on pt's bedside table only 1 medium cup with a lid on it. Inquired about this. Pt stated although dietary staff stopped by with entire tray he only gave her the cup of mashed potatoes and gravy from it then walked away with rest of tray. Explained to pt unsure why this happened and will call dietary team to order new tray. Called dietary who stated they will send new tray soon and are sorry for the inconvenience.

## 2022-01-03 DIAGNOSIS — N179 Acute kidney failure, unspecified: Secondary | ICD-10-CM | POA: Diagnosis not present

## 2022-01-03 LAB — BASIC METABOLIC PANEL
Anion gap: 2 — ABNORMAL LOW (ref 5–15)
BUN: 13 mg/dL (ref 6–20)
CO2: 26 mmol/L (ref 22–32)
Calcium: 9.2 mg/dL (ref 8.9–10.3)
Chloride: 113 mmol/L — ABNORMAL HIGH (ref 98–111)
Creatinine, Ser: 0.42 mg/dL — ABNORMAL LOW (ref 0.44–1.00)
GFR, Estimated: >60 mL/min (ref >60)
Glucose, Bld: 100 mg/dL — ABNORMAL HIGH (ref 70–99)
Potassium: 4.1 mmol/L (ref 3.5–5.1)
Sodium: 141 mmol/L (ref 135–145)

## 2022-01-03 LAB — COMPREHENSIVE METABOLIC PANEL
ALT: 159 U/L — ABNORMAL HIGH (ref 0–44)
AST: 90 U/L — ABNORMAL HIGH (ref 15–41)
Albumin: 3.1 g/dL — ABNORMAL LOW (ref 3.5–5.0)
Alkaline Phosphatase: 176 U/L — ABNORMAL HIGH (ref 38–126)
Anion gap: 3 — ABNORMAL LOW (ref 5–15)
BUN: 17 mg/dL (ref 6–20)
CO2: 26 mmol/L (ref 22–32)
Calcium: 8.9 mg/dL (ref 8.9–10.3)
Chloride: 112 mmol/L — ABNORMAL HIGH (ref 98–111)
Creatinine, Ser: 0.54 mg/dL (ref 0.44–1.00)
GFR, Estimated: >60 mL/min (ref >60)
Glucose, Bld: 92 mg/dL (ref 70–99)
Potassium: 4.1 mmol/L (ref 3.5–5.1)
Sodium: 141 mmol/L (ref 135–145)
Total Bilirubin: 1 mg/dL (ref 0.3–1.2)
Total Protein: 5.6 g/dL — ABNORMAL LOW (ref 6.5–8.1)

## 2022-01-03 LAB — GLUCOSE, CAPILLARY
Glucose-Capillary: 121 mg/dL — ABNORMAL HIGH (ref 70–99)
Glucose-Capillary: 95 mg/dL (ref 70–99)

## 2022-01-03 LAB — CBG MONITORING, ED
Glucose-Capillary: 104 mg/dL — ABNORMAL HIGH (ref 70–99)
Glucose-Capillary: 94 mg/dL (ref 70–99)

## 2022-01-03 LAB — HEPATITIS PANEL, ACUTE
HCV Ab: NONREACTIVE
Hep A IgM: NONREACTIVE
Hep B C IgM: NONREACTIVE
Hepatitis B Surface Ag: NONREACTIVE

## 2022-01-03 LAB — PROCALCITONIN: Procalcitonin: <0.1 ng/mL

## 2022-01-03 MED ORDER — GABAPENTIN 300 MG PO CAPS
300.0000 mg | ORAL_CAPSULE | Freq: Three times a day (TID) | ORAL | Status: DC
  Administered 2022-01-03 – 2022-01-13 (×29): 300 mg via ORAL
  Filled 2022-01-03 (×29): qty 1

## 2022-01-03 MED ORDER — LEVOTHYROXINE SODIUM 100 MCG PO TABS: 300.0000 ug | ORAL_TABLET | Freq: Every day | ORAL | Status: DC

## 2022-01-03 MED ORDER — KETOROLAC TROMETHAMINE 30 MG/ML IJ SOLN
30.0000 mg | Freq: Once | INTRAMUSCULAR | Status: AC
  Administered 2022-01-03: 30 mg via INTRAVENOUS
  Filled 2022-01-03: qty 1

## 2022-01-03 MED ORDER — POTASSIUM CHLORIDE IN NACL 20-0.9 MEQ/L-% IV SOLN
INTRAVENOUS | Status: DC
  Filled 2022-01-03: qty 1000

## 2022-01-03 MED ORDER — PROPRANOLOL HCL 10 MG PO TABS
10.0000 mg | ORAL_TABLET | Freq: Two times a day (BID) | ORAL | Status: DC
  Administered 2022-01-03 – 2022-01-04 (×3): 10 mg via ORAL
  Filled 2022-01-03 (×3): qty 1

## 2022-01-03 MED ORDER — QUETIAPINE FUMARATE 200 MG PO TABS
200.0000 mg | ORAL_TABLET | Freq: Every day | ORAL | Status: DC
  Administered 2022-01-03 – 2022-01-12 (×10): 200 mg via ORAL
  Filled 2022-01-03 (×10): qty 1

## 2022-01-03 MED ORDER — MORPHINE SULFATE (PF) 2 MG/ML IV SOLN
1.0000 mg | INTRAVENOUS | Status: DC | PRN
  Administered 2022-01-03 – 2022-01-08 (×20): 1 mg via INTRAVENOUS
  Filled 2022-01-03 (×20): qty 1

## 2022-01-03 MED ORDER — KETOROLAC TROMETHAMINE 15 MG/ML IJ SOLN
15.0000 mg | Freq: Four times a day (QID) | INTRAMUSCULAR | Status: DC | PRN
  Administered 2022-01-03 – 2022-01-04 (×2): 15 mg via INTRAVENOUS
  Filled 2022-01-03 (×2): qty 1

## 2022-01-03 MED ORDER — LEVOTHYROXINE SODIUM 50 MCG PO TABS
250.0000 ug | ORAL_TABLET | Freq: Every day | ORAL | Status: DC
  Administered 2022-01-04 – 2022-01-13 (×9): 250 ug via ORAL
  Filled 2022-01-03 (×11): qty 1

## 2022-01-03 MED ORDER — FENTANYL CITRATE PF 50 MCG/ML IJ SOSY
12.5000 ug | PREFILLED_SYRINGE | Freq: Once | INTRAMUSCULAR | Status: AC
  Administered 2022-01-03: 12.5 ug via INTRAVENOUS
  Filled 2022-01-03: qty 1

## 2022-01-03 NOTE — Progress Notes (Signed)
       CROSS COVER NOTE  NAME: Rhonda Palmer MRN: [id number] DOB : Nov 01, 1970       HPI/Events of Note   Patient having difficulty sleeping. Normally takes 600 seroquel + ambien at home.   Assessment and  Interventions   Assessment: Had mental status change and required pressors first night of hospitalization after receiving her seroquel home dose but was likely multifactorial with AKI and dehydration.  Plan: Restart seroquel 200 mg nightly for sleep      Donnie Mesa NP [facility]

## 2022-01-03 NOTE — Progress Notes (Addendum)
    Patient: Rhonda Palmer KDT:[id number] DOB: [birth year] DOA: 01/01/2022 DOS: the patient was seen and examined on 01/03/2022 Primary service: Kathrynn Running, MD  Brief hospital course: Hx gastric sleeve presenting with abd pain and nausea/vomiting/diarrhea, found to have aki and elevated LFTS, the latter have improved/resolved with fluids and no further vomiting or diarrhea but sig abd pain persists, CT scan abnormal. Transient hypotension on 12/7 requiring pressors in setting of evening seroquel which has been held. Gen surg consutled 12/8  Assessment and Plan:  Hypotension Likely due to hypovolemia in setting of N/D, home seroquel likely contributed. 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o. intake  GAD - cont home propranolol but will reduce dose to 10 given recent hypotension; buspar        TRH will continue to follow the patient.  Subjective: moderate abd pain, some nausea, no v/d  Physical Exam: Gen: nad Lungs: ctab save for faint rales at bases Heart: rrr, no murmurs Abd: soft, obese, mild ttp epigastrum no rebound Ext: warm,  trace edema   Vitals:   01/02/22 2247 01/02/22 2336 01/03/22 0238 01/03/22 0544  BP: 102/89 107/64 119/76 123/88  Pulse: 77 76 73 64  Resp: 18 18 18 18   Temp: 98.2 F (36.8 C)     TempSrc: Oral     SpO2: 98% 99% 95% 95%  Weight:      Height:        Data Reviewed:  There are no new results to review at this time.  Family Communication: None at bedside   Author: , MD 01/03/2022 8:24 AM  For on call review [email or web address].

## 2022-01-03 NOTE — Consult Note (Addendum)
Subjective:   CC: Fat necrosis of abdominal wall/mesentery  HPI:  Rhonda Palmer Rhonda Palmer is a 51 y.o. female [facility]  Fat necrosis initially noted last year with presentation of abdominal pain.  That episode has resolved and she was asymptomatic until couple days ago when she had sudden onset abdominal pain along with nausea vomiting diarrhea shortly afterwards.    She was diagnosed with acute kidney injury secondary to gastroenteritis during this admission and the nausea and diarrhea has resolved but the pain has persisted.  Surgery consulted for reassessment of the fat necrosis abdominal wall mesentery which was noted to be improved since last exam on the latest CT.    Patient currently states abdominal pain has been persistent since onset 2 days ago, periumbilical.   Past Medical History:  has a past medical history of (HFpEF) heart failure with preserved ejection fraction (HCC), Anxiety, Asthma, Bipolar 1 disorder (HCC), Diabetes mellitus without complication (HCC), Edema leg (for twenty years per patient), Fibromyalgia, History of cardiac catheterization, Hypertension, Hypothyroidism, Morbid obesity (HCC), Ovarian cyst, Palpitations, and Schizo affective schizophrenia (HCC).  Past Surgical History:  has a past surgical history that includes Ankle surgery; Abdominal hysterectomy; and LEFT HEART CATH AND CORONARY ANGIOGRAPHY (N/A, 03/13/2020).  Family History: family history includes Heart failure in her mother.  Social History:  reports that she has never smoked. She has never used smokeless tobacco. She reports that she does not drink alcohol and does not use drugs.  Current Medications:  Prior to Admission medications   Medication Sig Start Date End Date Taking? Authorizing Provider  busPIRone (BUSPAR) 15 MG tablet Take 15 mg by mouth 3 (three) times daily as needed. 10/23/21  Yes [provider]  gabapentin (NEURONTIN) 300 MG capsule Take 300  mg by mouth 3 (three) times daily.   Yes [provider]  GOODSENSE CLEARLAX 17 GM/SCOOP powder Take 17 g by mouth daily. 08/14/21  Yes [provider]  IBU 800 MG tablet Take 800 mg by mouth every 6 (six) hours as needed. 12/30/21  Yes [provider]  levothyroxine (SYNTHROID) 50 MCG tablet Take 300 mcg by mouth every morning. 11/06/21  Yes [provider]  lisinopril (ZESTRIL) 20 MG tablet Take 20 mg by mouth daily. 11/06/21  Yes [provider]  ondansetron (ZOFRAN-ODT) 4 MG disintegrating tablet Take 4 mg by mouth 3 (three) times daily as needed. 12/30/21  Yes [provider]  OZEMPIC, 1 MG/DOSE, 4 MG/3ML SOPN Inject 1 mg into the skin once a week. 12/23/21  Yes [provider]  propranolol (INDERAL) 20 MG tablet Take 20 mg by mouth 2 (two) times daily. 12/20/21  Yes [provider]  QUEtiapine (SEROQUEL) 200 MG tablet Take 400 mg by mouth at bedtime. 12/30/21  Yes [provider]  torsemide (DEMADEX) 20 MG tablet Take 20 mg by mouth daily. 12/09/21  Yes [provider]  albuterol (PROVENTIL HFA;VENTOLIN HFA) 108 (90 Base) MCG/ACT inhaler Inhale 2 puffs into the lungs every 6 (six) hours as needed for wheezing or shortness of breath.    [provider]  ARISTADA [id number] MG/3.9ML prefilled syringe Inject 1,064 mg into the muscle every 8 (eight) weeks. Patient not taking: Reported on 01/11/2021 10/31/20   [provider]  clonazePAM (KLONOPIN) 0.5 MG tablet Take 0.5 mg by mouth 3 (three) times daily.  Patient not taking: Reported on 01/01/2022 02/18/18   [provider]  HYDROcodone-acetaminophen (NORCO/VICODIN) 5-325 MG tablet  Take 1 tablet by mouth every 4 (four) hours as needed. Patient not taking: Reported on 01/01/2022 09/23/21   [provider]  levothyroxine (SYNTHROID) 175 MCG tablet Take 2 tablets (350 mcg total) by mouth daily at 6 (six) AM. 01/16/21 02/15/21  Delfino Lovett, MD   lisinopril (ZESTRIL) 10 MG tablet Take 10 mg by mouth daily. Patient not taking: Reported on 01/01/2022 12/10/20   [provider]  loperamide (IMODIUM A-D) 2 MG tablet Take 1 tablet (2 mg total) by mouth 4 (four) times daily as needed for diarrhea or loose stools. 01/15/21   Delfino Lovett, MD  potassium chloride SA (KLOR-CON M) 20 MEQ tablet Take 1 tablet (20 mEq total) by mouth daily. Patient not taking: Reported on 01/01/2022 12/08/21   Tommi Rumps, PA-C  QUEtiapine (SEROQUEL) 300 MG tablet Take 600 mg by mouth at bedtime. Patient not taking: Reported on 01/01/2022 02/16/20   [provider]  simvastatin (ZOCOR) 20 MG tablet Take 20 mg by mouth at bedtime. Patient not taking: Reported on 01/01/2022 12/10/20   [provider]  sucralfate (CARAFATE) 1 GM/10ML suspension Take 10 mLs (1 g total) by mouth 4 (four) times daily -  with meals and at bedtime. Patient not taking: Reported on 01/01/2022 01/15/21   Delfino Lovett, MD  traMADol (ULTRAM) 50 MG tablet Take 1 tablet by mouth every 6 (six) hours as needed. Patient not taking: Reported on 01/01/2022 10/09/21   [provider]  zolpidem (AMBIEN) 5 MG tablet Take 5 mg by mouth at bedtime as needed. Patient not taking: Reported on 01/01/2022 03/05/20   [provider]    Allergies:  Allergies  Allergen Reactions   Codeine Hives, Itching, Rash and Swelling   Sulfa Antibiotics Anaphylaxis   Penicillins Itching    ROS:  General: Denies weight loss, weight gain, fatigue, fevers, chills, and night sweats. Eyes: Denies blurry vision, double vision, eye pain, itchy eyes, and tearing. Ears: Denies hearing loss, earache, and ringing in ears. Nose: Denies sinus pain, congestion, infections, runny nose, and nosebleeds. Mouth/throat: Denies hoarseness, sore throat, bleeding gums, and difficulty swallowing. Heart: Denies chest pain, palpitations, racing heart, irregular heartbeat, leg pain or swelling, and  decreased activity tolerance. Respiratory: Denies breathing difficulty, shortness of breath, wheezing, cough, and sputum. GI: Denies change in appetite, heartburn, constipation, and blood in stool. GU: Denies difficulty urinating, pain with urinating, urgency, frequency, blood in urine. Musculoskeletal: Denies joint stiffness, pain, swelling, muscle weakness. Skin: Denies rash, itching, mass, tumors, sores, and boils Neurologic: Denies headache, fainting, dizziness, seizures, numbness, and tingling. Psychiatric: Denies depression, anxiety, difficulty sleeping, and memory loss. Endocrine: Denies heat or cold intolerance, and increased thirst or urination. Blood/lymph: Denies easy bruising, easy bruising, and swollen glands     Objective:     BP (!) 143/95   Pulse 89   Temp 98.2 F (36.8 C) (Oral)   Resp (!) 21   Ht 5' (1.524 m)   Wt 89.8 kg   SpO2 96%   BMI 38.67 kg/m   Constitutional :  alert, cooperative, appears stated age, and no distress  Lymphatics/Throat:  no asymmetry, masses, or scars  Respiratory:  clear to auscultation bilaterally  Cardiovascular:  regular rate and rhythm  Gastrointestinal: Soft, no guarding, diffuse abdominal tenderness to palpation that is very minimal until very deep palpation only slightly more tender over the area of fat necrosis noted on CT .  Musculoskeletal: Steady gait and movement  Skin: Cool and moist, no skin rashes  Psychiatric: Normal affect, non-agitated, not confused       LABS:     Latest Ref Rng & Units 01/03/2022    6:11 AM 01/02/2022   10:53 PM 01/02/2022    3:44 AM  CMP  Glucose 70 - 99 mg/dL 384  92  99   BUN 6 - 20 mg/dL 13  17  33   Creatinine 0.44 - 1.00 mg/dL 6.65  9.93  5.70   Sodium 135 - 145 mmol/L 141  141  136   Potassium 3.5 - 5.1 mmol/L 4.1  4.1  3.3   Chloride 98 - 111 mmol/L 113  112  104   CO2 22 - 32 mmol/L 26  26  26    Calcium 8.9 - 10.3 mg/dL 9.2  8.9  8.3   Total Protein 6.5 - 8.1 g/dL  5.6  5.0    Total Bilirubin 0.3 - 1.2 mg/dL  1.0  1.9    1.9   Alkaline Phos 38 - 126 U/L  176  223   AST 15 - 41 U/L  90  243   ALT 0 - 44 U/L  159  237       Latest Ref Rng & Units 01/02/2022    3:44 AM 01/01/2022   11:40 AM 12/08/2021    9:51 AM  CBC  WBC 4.0 - 10.5 K/uL 7.5  11.2  8.3   Hemoglobin 12.0 - 15.0 g/dL 9.9  13/12/2021  17.7   Hematocrit 36.0 - 46.0 % 31.6  41.4  40.6   Platelets 150 - 400 K/uL 160  239  193     RADS: CLINICAL DATA:  51 year old female with acute abdominal pain. Weakness, vomiting. Abnormal LFTs.   EXAM: CT ABDOMEN AND PELVIS WITHOUT CONTRAST   TECHNIQUE: Multidetector CT imaging of the abdomen and pelvis was performed following the standard protocol without IV contrast.   RADIATION DOSE REDUCTION: This exam was performed according to the departmental dose-optimization program which includes automated exposure control, adjustment of the mA and/or kV according to patient size and/or use of iterative reconstruction technique.   COMPARISON:  CT Abdomen and Pelvis 01/11/2021.   FINDINGS: Lower chest: Borderline cardiomegaly. No pericardial or pleural effusion. Dependent lung base opacity, asymmetric involvement in the left lower lobe with slightly nodular peribronchial opacity there.   Hepatobiliary: Chronically absent gallbladder. Hepatic steatosis is less apparent. Bile ducts appear stable. No perihepatic free fluid.   Pancreas: Partially atrophied.   Spleen: Negative.   Adrenals/Urinary Tract: Normal adrenal glands. Renal pelves are more prominent today, but there is no convincing hydronephrosis or pararenal inflammation. No nephrolithiasis. Urinary bladder is distended today (530 mL) but otherwise unremarkable. Distal ureters are decompressed. Pelvic phleboliths but no convincing urinary calculus.   Stomach/Bowel: Redundant sigmoid colon tracks in the midline above the umbilicus. Large bowel retained stool primarily upstream of the sigmoid. No  large bowel inflammation identified. Chronic right gonadal vein phleboliths. A normal appendix is identified tracking into the right hemipelvis on coronal image 43. Decompressed terminal ileum. No dilated small bowel.   Evolved ventral left abdominal wall/mesentery fat necrosis since the CT last year. 5 cm loculated area of fat fluid level there are now with no surrounding inflammation (series 2, image 52) just deep to the atrophied left rectus muscle. Satisfactory evolution of other ventral abdominal wall postoperative changes since last year.   Stable postoperative appearance of gastric sleeve. Fairly decompressed proximal bypassed portion of the stomach. Other stomach and duodenum nondilated. No free  air, free fluid, or mesenteric inflammation identified.   Vascular/Lymphatic: Normal caliber abdominal aorta with little to no calcified atherosclerosis. No lymphadenopathy identified.   Reproductive: Surgically absent uterus, diminutive or absent ovaries.   Other: Chronic pelvic phleboliths.  No free fluid.   Musculoskeletal: No acute osseous abnormality identified. Widespread chronic subcutaneous flank nodularity may reflect subcutaneous injection sites.   IMPRESSION: 1. Left greater than right lower lobe opacity at the lung bases, indeterminate for atelectasis versus infection.   2. Stable gastric sleeve. Evolved ventral left abdominal wall/mesentery 4-5 cm area of fat necrosis since the CT last year. Residual loculated fat-fluid collection there with no complicating features.   3. No other acute or inflammatory process identified in the non-contrast abdomen or pelvis; distended urinary bladder (530 mL) - query urinary retention.     Electronically Signed   By: Odessa Fleming M.D.   On: 01/02/2022 07:07    Assessment:     Fat necrosis of abdominal wall/mesentery Acute gastroenteritis Acute kidney injury-resolved   Plan:     1. Alternatives include continued  observation.  Benefits include possible symptom relief, pathologic evaluation,  Discussed the risk of surgery including damage to surrounding organs, bleeding, infection, chronic pain, post-op infxn, poor cosmesis, poor/delayed wound healing, and possible re-operation to address said risks. The risks of general anesthetic, if used, includes MI, CVA, sudden death or even reaction to anesthetic medications also discussed.   doubt she'll get significant relief from her pain by removing this chronic fat necrosis that has improved significantly since last CT. patient will like to  think about whether or not to proceed with surgery, understanding the very low likelihood of any significant change in her current pain. NPO until she makes decision.  She has generalized abdominal pain with slightly more focal pain right over area of concern.  This likely is generalized abdominal strain and pain from her recent G.I. upset.  Toradol added for additional pain control.  The patient verbalized understanding and all questions were answered to the patient's satisfaction.  UPDATE: Patient has decided against any surgical intervention.  Carb modified diet was ordered per hospitalist.  Surgery will sign off for now.  Please call with any recurrent issues.  labs/images/medications/previous chart entries reviewed personally and relevant changes/updates noted above.

## 2022-01-04 DIAGNOSIS — N179 Acute kidney failure, unspecified: Secondary | ICD-10-CM | POA: Diagnosis not present

## 2022-01-04 DIAGNOSIS — R112 Nausea with vomiting, unspecified: Secondary | ICD-10-CM

## 2022-01-04 LAB — COMPREHENSIVE METABOLIC PANEL
ALT: 99 U/L — ABNORMAL HIGH (ref 0–44)
AST: 32 U/L (ref 15–41)
Albumin: 3.2 g/dL — ABNORMAL LOW (ref 3.5–5.0)
Alkaline Phosphatase: 153 U/L — ABNORMAL HIGH (ref 38–126)
Anion gap: 4 — ABNORMAL LOW (ref 5–15)
BUN: 8 mg/dL (ref 6–20)
CO2: 26 mmol/L (ref 22–32)
Calcium: 9.3 mg/dL (ref 8.9–10.3)
Chloride: 112 mmol/L — ABNORMAL HIGH (ref 98–111)
Creatinine, Ser: 0.41 mg/dL — ABNORMAL LOW (ref 0.44–1.00)
GFR, Estimated: >60 mL/min (ref >60)
Glucose, Bld: 107 mg/dL — ABNORMAL HIGH (ref 70–99)
Potassium: 3.9 mmol/L (ref 3.5–5.1)
Sodium: 142 mmol/L (ref 135–145)
Total Bilirubin: 1.1 mg/dL (ref 0.3–1.2)
Total Protein: 5.9 g/dL — ABNORMAL LOW (ref 6.5–8.1)

## 2022-01-04 LAB — CBC
HCT: 35.6 % — ABNORMAL LOW (ref 36.0–46.0)
Hemoglobin: 11.1 g/dL — ABNORMAL LOW (ref 12.0–15.0)
MCH: 27.8 pg (ref 26.0–34.0)
MCHC: 31.2 g/dL (ref 30.0–36.0)
MCV: 89.2 fL (ref 80.0–100.0)
Platelets: 184 10*3/uL (ref 150–400)
RBC: 3.99 MIL/uL (ref 3.87–5.11)
RDW: 14.7 % (ref 11.5–15.5)
WBC: 7.5 10*3/uL (ref 4.0–10.5)
nRBC: 0 % (ref 0.0–0.2)

## 2022-01-04 LAB — GLUCOSE, CAPILLARY
Glucose-Capillary: 91 mg/dL (ref 70–99)
Glucose-Capillary: 88 mg/dL (ref 70–99)
Glucose-Capillary: 90 mg/dL (ref 70–99)
Glucose-Capillary: 91 mg/dL (ref 70–99)

## 2022-01-04 LAB — TROPONIN I (HIGH SENSITIVITY): Troponin I (High Sensitivity): 11 ng/L (ref <18)

## 2022-01-04 LAB — LIPASE, BLOOD: Lipase: 29 U/L (ref 11–51)

## 2022-01-04 MED ORDER — PANTOPRAZOLE SODIUM 40 MG IV SOLR
40.0000 mg | Freq: Two times a day (BID) | INTRAVENOUS | Status: DC
  Administered 2022-01-04 – 2022-01-09 (×12): 40 mg via INTRAVENOUS
  Filled 2022-01-04 (×11): qty 10

## 2022-01-04 NOTE — Plan of Care (Signed)
  Problem: Coping: Goal: Ability to adjust to condition or change in health will improve Outcome: Progressing   Problem: Fluid Volume: Goal: Ability to maintain a balanced intake and output will improve Outcome: Progressing   

## 2022-01-04 NOTE — Progress Notes (Addendum)
    Patient: Rhonda Palmer IOX:[id number] DOB: 26-Feb-1970 DOA: 01/01/2022 DOS: the patient was seen and examined on 01/04/2022 Primary service: Kathrynn Running, MD  Brief hospital course: Hx gastric sleeve presenting with abd pain and nausea/vomiting/diarrhea, found to have aki and elevated LFTS, the latter have improved/resolved with fluids and no further vomiting or diarrhea but sig abd pain persists, CT scan abnormal. Transient hypotension on 12/7 requiring pressors in setting of evening seroquel which has been held. Gen surg consutled 12/8  Assessment and Plan:  Hypotension Likely due to hypovolemia in setting of N/D, home seroquel likely contributed. Briefly on pressors 12/7, now discontinued - cont ivf, monitor  Acute kidney injury (HCC) Prerenal from vomiting/diarrhea, resolved - monitor  Acute gastroenteritis? No vomiting or diarrhea here in the hospital so no stool studies have been performed  Abdominal pain Fat necrosis Continues to complain of severe periumbilical/epigastric abd pain. CT with fat necrosis in that area, though stable. CT otherwise unremarkable. - will check troponin and lipase to complete lab eval of this pain - given concurrent GI symptoms will consult GI, source could very well be gastric - gen surg also following, if no other etiology of pain identified may need to proceed with addressing the area of fat necrosis - will start ppi  Hypokalemia 2/2 vomiting/diarrhea, resolved - monitor  Transaminitis Likely shock liver as improving w/ fluids, hepatitis panel neg, nothing acute seen on ct - monitor  Hypothyroidism On 300 of levo at home, tsh here low to 0.05 - will decrease levo dose to 250, will need repeat tsh in 4-6 wks  Hypertension - Holding home lisinopril and torsemide in the setting of AKI  Diabetes mellitus without complication (HCC) - Hold home medications - SSI, sensitive given poor p.o. intake  GAD - cont home propranolol but  will reduce dose to 10 given recent hypotension; buspar        TRH will continue to follow the patient.  Subjective: moderate abd pain, some nausea, no v/d  Physical Exam: Gen: nad Lungs: ctab save for faint rales at bases Heart: rrr, no murmurs Abd: soft, obese, mild ttp epigastrum and throughout Ext: warm, trace edema   Vitals:   01/03/22 1529 01/03/22 2022 01/04/22 0404 01/04/22 0736  BP: (!) 142/85 127/84 118/83 (!) 142/96  Pulse: 83 79 75 80  Resp: 18 20 20 18   Temp: 99.5 F (37.5 C) 98.4 F (36.9 C) 98.2 F (36.8 C) 98 F (36.7 C)  TempSrc: Oral Oral Oral Oral  SpO2: 95% 93% 96% 93%  Weight:      Height:        Data Reviewed:  There are no new results to review at this time.  Family Communication: husband updated telephonically 12/9   Author: 14/9, MD 01/04/2022 11:17 AM  For on call review [email or web address].

## 2022-01-04 NOTE — Consult Note (Signed)
Rhonda Miniumarren Tasia Liz, MD [facility]  [address] [place], [place] [address] Phone: [phone] Fax : [phone]  Consultation  Referring Provider:     No ref. provider found Primary Care Physician:  Franciso Bendogers, Jennifer B, NP [facility]        Reason for Consultation:     Abdominal pain  Date of Admission:  01/01/2022 Date of Consultation:  01/04/2022         HPI:   Rhonda Palmer is a 51 y.o. female who reports that approximate 5 days ago she started to have nausea and vomiting with the first evening being all night long of vomiting.  She then continued to have vomiting with nausea and abdominal pain up until she was admitted.  The patient had not had any further vomiting but continues to note pain with exacerbation of the nausea with the abdominal pain.  She reports the pain to be in the mid abdomen and a constant pain.  The patient did have a CT scan that showed:  IMPRESSION: 1. Left greater than right lower lobe opacity at the lung bases, indeterminate for atelectasis versus infection.   2. Stable gastric sleeve. Evolved ventral left abdominal wall/mesentery 4-5 cm area of fat necrosis since the CT last year. Residual loculated fat-fluid collection there with no complicating features.   3. No other acute or inflammatory process identified in the non-contrast abdomen or pelvis; distended urinary bladder (530 mL) - query urinary retention.  The patient had a slightly elevated white cell count on admission of 11.2 but has remained stable at 7.5.  The hemoglobin this morning was 11.1.  Past Medical History:  Diagnosis Date   (HFpEF) heart failure with preserved ejection fraction (HCC)    a. 02/2020 Echo: EF 55-60%, no rwma. Nl RV fxn.   Anxiety    Asthma    Bipolar 1 disorder (HCC)    Diabetes mellitus without complication (HCC)    Edema leg for twenty years per patient   taking lasix for edema   Fibromyalgia    History of cardiac catheterization     a. 02/2020 Cath: Nl cors. LVEDP 40mmHg -> torsemide increased to 40mg  daily.   Hypertension    Hypothyroidism    Morbid obesity (HCC)    Ovarian cyst    Palpitations    a. 03/2020 Zio: RSR, avg HR 84 (55-203), occas SVT runs. No significant arrhythmias.  Most triggered events associate with sinus rhythm.   Schizo affective schizophrenia [facility](HCC)     Past Surgical History:  Procedure Laterality Date   ABDOMINAL HYSTERECTOMY     ANKLE SURGERY     pt. states long time ago   LEFT HEART CATH AND CORONARY ANGIOGRAPHY N/A 03/13/2020   Procedure: LEFT HEART CATH AND CORONARY ANGIOGRAPHY;  Surgeon: Yvonne KendallEnd, Christopher, MD;  Location: [facility] INVASIVE CV LAB;  Service: Cardiovascular;  Laterality: N/A;    Prior to Admission medications   Medication Sig Start Date End Date Taking? Authorizing Provider  busPIRone (BUSPAR) 15 MG tablet Take 15 mg by mouth 3 (three) times daily as needed. 10/23/21  Yes [provider]  gabapentin (NEURONTIN) 300 MG capsule Take 300 mg by mouth 3 (three) times daily.   Yes [provider]  GOODSENSE CLEARLAX 17 GM/SCOOP powder Take 17 g by mouth daily. 08/14/21  Yes [provider]  IBU 800 MG tablet Take 800 mg by mouth every 6 (six) hours as needed. 12/30/21  Yes [provider]  levothyroxine (SYNTHROID) 50 MCG tablet Take 300 mcg by mouth every morning. 11/06/21  Yes [provider]  lisinopril (ZESTRIL) 20 MG tablet Take 20 mg by mouth daily. 11/06/21  Yes [provider]  ondansetron (ZOFRAN-ODT) 4 MG disintegrating tablet Take 4 mg by mouth 3 (three) times daily as needed. 12/30/21  Yes [provider]  OZEMPIC, 1 MG/DOSE, 4 MG/3ML SOPN Inject 1 mg into the skin once a week. 12/23/21  Yes [provider]  propranolol (INDERAL) 20 MG tablet Take 20 mg by mouth 2 (two) times daily. 12/20/21  Yes [provider]  QUEtiapine (SEROQUEL) 200 MG tablet Take 400 mg by mouth at bedtime. 12/30/21  Yes  [provider]  torsemide (DEMADEX) 20 MG tablet Take 20 mg by mouth daily. 12/09/21  Yes [provider]  albuterol (PROVENTIL HFA;VENTOLIN HFA) 108 (90 Base) MCG/ACT inhaler Inhale 2 puffs into the lungs every 6 (six) hours as needed for wheezing or shortness of breath.    [provider]  ARISTADA [phone] MG/3.9ML prefilled syringe Inject 1,064 mg into the muscle every 8 (eight) weeks. Patient not taking: Reported on 01/11/2021 10/31/20   [provider]  clonazePAM (KLONOPIN) 0.5 MG tablet Take 0.5 mg by mouth 3 (three) times daily.  Patient not taking: Reported on 01/01/2022 02/18/18   [provider]  HYDROcodone-acetaminophen (NORCO/VICODIN) 5-325 MG tablet Take 1 tablet by mouth every 4 (four) hours as needed. Patient not taking: Reported on 01/01/2022 09/23/21   [provider]  levothyroxine (SYNTHROID) 175 MCG tablet Take 2 tablets (350 mcg total) by mouth daily at 6 (six) AM. 01/16/21 02/15/21  Delfino Lovett, MD  lisinopril (ZESTRIL) 10 MG tablet Take 10 mg by mouth daily. Patient not taking: Reported on 01/01/2022 12/10/20   [provider]  loperamide (IMODIUM A-D) 2 MG tablet Take 1 tablet (2 mg total) by mouth 4 (four) times daily as needed for diarrhea or loose stools. 01/15/21   Delfino Lovett, MD  potassium chloride SA (KLOR-CON M) 20 MEQ tablet Take 1 tablet (20 mEq total) by mouth daily. Patient not taking: Reported on 01/01/2022 12/08/21   Tommi Rumps, PA-C  QUEtiapine (SEROQUEL) 300 MG tablet Take 600 mg by mouth at bedtime. Patient not taking: Reported on 01/01/2022 02/16/20   [provider]  simvastatin (ZOCOR) 20 MG tablet Take 20 mg by mouth at bedtime. Patient not taking: Reported on 01/01/2022 12/10/20   [provider]  sucralfate (CARAFATE) 1 GM/10ML suspension Take 10 mLs (1 g total) by mouth 4 (four) times daily -  with meals and at bedtime. Patient not taking: Reported on 01/01/2022 01/15/21    Delfino Lovett, MD  traMADol (ULTRAM) 50 MG tablet Take 1 tablet by mouth every 6 (six) hours as needed. Patient not taking: Reported on 01/01/2022 10/09/21   [provider]  zolpidem (AMBIEN) 5 MG tablet Take 5 mg by mouth at bedtime as needed. Patient not taking: Reported on 01/01/2022 03/05/20   [provider]    Family History  Problem Relation Age of Onset   Heart failure Mother    Bladder Cancer Neg Hx    Kidney cancer Neg Hx      Social History   Tobacco Use   Smoking status: Never   Smokeless tobacco: Never  Vaping Use   Vaping Use: Never used  Substance Use Topics   Alcohol use: No   Drug use: No    Allergies as of 01/01/2022 -  Review Complete 01/01/2022  Allergen Reaction Noted   Codeine Hives, Itching, Rash, and Swelling 05/01/2012   Sulfa antibiotics Anaphylaxis 12/08/2015   Penicillins Itching 08/12/2014    Review of Systems:    All systems reviewed and negative except where noted in HPI.   Physical Exam:  Vital signs in last 24 hours: Temp:  [98 F (36.7 C)-99.5 F (37.5 C)] 98 F (36.7 C) (12/09 0736) Pulse Rate:  [75-83] 80 (12/09 0736) Resp:  [18-20] 18 (12/09 0736) BP: (118-142)/(83-96) 142/96 (12/09 0736) SpO2:  [93 %-96 %] 93 % (12/09 0736) Last BM Date : 01/03/22 General:   Pleasant, cooperative in NAD Head:  Normocephalic and atraumatic. Eyes:   No icterus.   Conjunctiva pink. PERRLA. Ears:  Normal auditory acuity. Neck:  Supple; no masses or thyroidomegaly Lungs: Respirations even and unlabored. Lungs clear to auscultation bilaterally.   No wheezes, crackles, or rhonchi.  Heart:  Regular rate and rhythm;  Without murmur, clicks, rubs or gallops Abdomen:  Soft, nondistended, positive tenderness to 1 finger palpation while flexing the abdominal wall muscles by raising the patient's legs 6 inches above the bed while palpating the abdominal wall.  This also resulted in exacerbation of her nausea. Normal bowel sounds. No appreciable  masses or hepatomegaly.  No rebound or guarding.  Positive Carnett's sign Rectal:  Not performed. Msk:  Symmetrical without gross deformities.    Extremities:  Without edema, cyanosis or clubbing. Neurologic:  Alert and oriented x3;  grossly normal neurologically. Skin:  Intact without significant lesions or rashes. Cervical Nodes:  No significant cervical adenopathy. Psych:  Alert and cooperative. Normal affect.  LAB RESULTS: Recent Labs    01/02/22 0344 01/04/22 0501  WBC 7.5 7.5  HGB 9.9* 11.1*  HCT 31.6* 35.6*  PLT 160 184   BMET Recent Labs    01/02/22 2253 01/03/22 0611 01/04/22 0501  NA 141 141 142  K 4.1 4.1 3.9  CL 112* 113* 112*  CO2 26 26 26   GLUCOSE 92 100* 107*  BUN 17 13 8   CREATININE 0.54 0.42* 0.41*  CALCIUM 8.9 9.2 9.3   LFT Recent Labs    01/02/22 0344 01/02/22 2253 01/04/22 0501  PROT 5.0*   < > 5.9*  ALBUMIN 2.4*   < > 3.2*  AST 243*   < > 32  ALT 237*   < > 99*  ALKPHOS 223*   < > 153*  BILITOT 1.9*  1.9*   < > 1.1  BILIDIR 0.6*  --   --   IBILI 1.3*  --   --    < > = values in this interval not displayed.   PT/INR No results for input(s): "LABPROT", "INR" in the last 72 hours.  STUDIES: No results found.    Impression / Plan:   Assessment: Principal Problem:   Acute kidney injury (HCC) Active Problems:   Schizoaffective disorder, bipolar type (HCC)   Obesity   Hypothyroidism   Hypokalemia   Chronic pain syndrome   Asthma   Long term (current) use of opiate analgesic   Bipolar 1 disorder (HCC)   Hypertension   Diabetes mellitus without complication (HCC)   Acute gastroenteritis   Acute kidney failure (HCC)   Tyshauna Finkbiner is a 51 y.o. 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  The patient's vomiting has decreased and the he appears to  be exacerbated by her abdominal pain.  On physical exam the abdominal pain is clearly musculoskeletal with continued abdominal discomfort to 1 finger palpation while flexing the abdominal wall muscles.   Plan:  In light of the patient's normal white cell count and improving vomiting I would recommend conservative treatment at this time.  The patient's abdominal pain is clearly musculoskeletal with the nausea being contributed to by the pain.  The musculoskeletal pain is likely a result of her continued vomiting up until today.  The patient tolerated a breakfast this morning.  If the patient's symptoms should worsen or the patient's vomiting should start again then a upper endoscopy may be entertained but at this point I would hold off on any endoscopic procedures.  The patient has been explained the plan and agrees with it.  Thank you for involving me in the care of this patient.      LOS: 2 days   Rhonda Minium, MD, [facility] 01/04/2022, 1:13 PM,  Pager [phone] 7am-5pm  Check AMION for 5pm -7am coverage and on weekends   Note: This dictation was prepared with Dragon dictation along with smaller phrase technology. Any transcriptional errors that result from this process are unintentional.

## 2022-01-04 NOTE — Progress Notes (Signed)
Subjective:  CC: Rhonda Palmer is a 51 y.o. female  Hospital stay day 2,   abdominal pain  HPI: Persistent abdominal pain despite normal reported issues with nausea vomiting or diarrhea.  Patient states the most tender portion is slightly above the umbilicus.  ROS:  General: Denies weight loss, weight gain, fatigue, fevers, chills, and night sweats. Heart: Denies chest pain, palpitations, racing heart, irregular heartbeat, leg pain or swelling, and decreased activity tolerance. Respiratory: Denies breathing difficulty, shortness of breath, wheezing, cough, and sputum. GI: Denies change in appetite, heartburn, nausea, vomiting, constipation, diarrhea, and blood in stool. GU: Denies difficulty urinating, pain with urinating, urgency, frequency, blood in urine.   Objective:   Temp:  [98 F (36.7 C)-99.5 F (37.5 C)] 98 F (36.7 C) (12/09 0736) Pulse Rate:  [71-83] 80 (12/09 0736) Resp:  [18-23] 18 (12/09 0736) BP: (118-142)/(83-96) 142/96 (12/09 0736) SpO2:  [93 %-96 %] 93 % (12/09 0736)     Height: 5' (152.4 cm) Weight: 89.8 kg BMI (Calculated): 38.67   Intake/Output this shift:   Intake/Output Summary (Last 24 hours) at 01/04/2022 1225 Last data filed at 01/04/2022 1030 Gross per 24 hour  Intake 919.12 ml  Output 0 ml  Net 919.12 ml    Constitutional :  alert, cooperative, appears stated age, and no distress  Respiratory:  clear to auscultation bilaterally  Cardiovascular:  regular rate and rhythm  Gastrointestinal: Soft, no guarding, tenderness to palpation still present throughout the abdomen, possible improvement?  Tenderness to palpation still most prominent right above the umbilicus .   Skin: Cool and moist.   Psychiatric: Normal affect, non-agitated, not confused       LABS:     Latest Ref Rng & Units 01/04/2022    5:01 AM 01/03/2022    6:11 AM 01/02/2022   10:53 PM  CMP  Glucose 70 - 99 mg/dL 893  810  92   BUN 6 - 20 mg/dL 8  13  17    Creatinine 0.44 - 1.00  mg/dL  1.75  1.02   Sodium 135 - 145 mmol/L 142  141  141   Potassium 3.5 - 5.1 mmol/L 3.9  4.1  4.1   Chloride 98 - 111 mmol/L 112  113  112   CO2 22 - 32 mmol/L 26  26  26    Calcium 8.9 - 10.3 mg/dL 9.3  9.2  8.9   Total Protein 6.5 - 8.1 g/dL 5.9   5.6   Total Bilirubin 0.3 - 1.2 mg/dL 1.1   1.0   Alkaline Phos 38 - 126 U/L 153   176   AST 15 - 41 U/L 32   90   ALT 0 - 44 U/L 99   159       Latest Ref Rng & Units 01/04/2022    5:01 AM 01/02/2022    3:44 AM 01/01/2022   11:40 AM  CBC  WBC 4.0 - 10.5 K/uL 7.5  7.5  11.2   Hemoglobin 12.0 - 15.0 g/dL 14/07/2021  9.9  14/06/2021   Hematocrit 36.0 - 46.0 % 35.6  31.6  41.4   Platelets 150 - 400 K/uL 184  160  239     RADS: N/a Assessment:   GI consult note pending but patient reported there may be possible intervention.  Will await GI recs prior to committing her to exploratory laparotomy and removal of fat necrosis, since her sudden onset of pain from otherwise improving fat necrosis since previous CT  scan makes undergoing risk of operative intervention still significantly high for the possible benefit of symptom relief  labs/images/medications/previous chart entries reviewed personally and relevant changes/updates noted above.

## 2022-01-05 ENCOUNTER — Other Ambulatory Visit: Payer: Self-pay

## 2022-01-05 ENCOUNTER — Inpatient Hospital Stay: Payer: Medicaid Other | Admitting: Certified Registered"

## 2022-01-05 ENCOUNTER — Encounter
Admission: EM | Disposition: A | Discharge status: Home / Self Care | Payer: Self-pay | Source: Home / Self Care | Attending: Obstetrics and Gynecology

## 2022-01-05 DIAGNOSIS — N179 Acute kidney failure, unspecified: Secondary | ICD-10-CM | POA: Diagnosis not present

## 2022-01-05 HISTORY — PX: LAPAROTOMY: SHX154

## 2022-01-05 LAB — COMPREHENSIVE METABOLIC PANEL
ALT: 68 U/L — ABNORMAL HIGH (ref 0–44)
AST: 20 U/L (ref 15–41)
Albumin: 3.1 g/dL — ABNORMAL LOW (ref 3.5–5.0)
Alkaline Phosphatase: 148 U/L — ABNORMAL HIGH (ref 38–126)
Anion gap: 3 — ABNORMAL LOW (ref 5–15)
BUN: 6 mg/dL (ref 6–20)
CO2: 28 mmol/L (ref 22–32)
Calcium: 9.1 mg/dL (ref 8.9–10.3)
Chloride: 110 mmol/L (ref 98–111)
Creatinine, Ser: 0.32 mg/dL — ABNORMAL LOW (ref 0.44–1.00)
GFR, Estimated: >60 mL/min (ref >60)
Glucose, Bld: 91 mg/dL (ref 70–99)
Potassium: 3.2 mmol/L — ABNORMAL LOW (ref 3.5–5.1)
Sodium: 141 mmol/L (ref 135–145)
Total Bilirubin: 1.3 mg/dL — ABNORMAL HIGH (ref 0.3–1.2)
Total Protein: 5.8 g/dL — ABNORMAL LOW (ref 6.5–8.1)

## 2022-01-05 LAB — CBC
HCT: 35.9 % — ABNORMAL LOW (ref 36.0–46.0)
Hemoglobin: 11.4 g/dL — ABNORMAL LOW (ref 12.0–15.0)
MCH: 27.7 pg (ref 26.0–34.0)
MCHC: 31.8 g/dL (ref 30.0–36.0)
MCV: 87.3 fL (ref 80.0–100.0)
Platelets: 200 10*3/uL (ref 150–400)
RBC: 4.11 MIL/uL (ref 3.87–5.11)
RDW: 14.4 % (ref 11.5–15.5)
WBC: 6.8 10*3/uL (ref 4.0–10.5)
nRBC: 0 % (ref 0.0–0.2)

## 2022-01-05 LAB — GLUCOSE, CAPILLARY
Glucose-Capillary: 101 mg/dL — ABNORMAL HIGH (ref 70–99)
Glucose-Capillary: 114 mg/dL — ABNORMAL HIGH (ref 70–99)
Glucose-Capillary: 145 mg/dL — ABNORMAL HIGH (ref 70–99)
Glucose-Capillary: 173 mg/dL — ABNORMAL HIGH (ref 70–99)
Glucose-Capillary: 83 mg/dL (ref 70–99)
Glucose-Capillary: 88 mg/dL (ref 70–99)
Glucose-Capillary: 91 mg/dL (ref 70–99)

## 2022-01-05 SURGERY — LAPAROTOMY, EXPLORATORY
Anesthesia: General | Site: Abdomen

## 2022-01-05 MED ORDER — CEFAZOLIN SODIUM-DEXTROSE 2-4 GM/100ML-% IV SOLN
2.0000 g | Freq: Once | INTRAVENOUS | Status: AC
  Administered 2022-01-05: 2 g via INTRAVENOUS

## 2022-01-05 MED ORDER — LISINOPRIL 20 MG PO TABS
20.0000 mg | ORAL_TABLET | Freq: Every day | ORAL | Status: DC
  Administered 2022-01-05 – 2022-01-06 (×2): 20 mg via ORAL
  Filled 2022-01-05 (×2): qty 1

## 2022-01-05 MED ORDER — CIPROFLOXACIN IN D5W 400 MG/200ML IV SOLN
400.0000 mg | Freq: Two times a day (BID) | INTRAVENOUS | Status: DC
  Administered 2022-01-05 – 2022-01-10 (×10): 400 mg via INTRAVENOUS
  Filled 2022-01-05 (×11): qty 200

## 2022-01-05 MED ORDER — LIDOCAINE HCL (CARDIAC) PF 100 MG/5ML IV SOSY
PREFILLED_SYRINGE | INTRAVENOUS | Status: DC | PRN
  Administered 2022-01-05: 100 mg via INTRAVENOUS

## 2022-01-05 MED ORDER — FENTANYL CITRATE (PF) 100 MCG/2ML IJ SOLN
INTRAMUSCULAR | Status: AC
  Filled 2022-01-05: qty 2

## 2022-01-05 MED ORDER — METRONIDAZOLE 500 MG/100ML IV SOLN
500.0000 mg | Freq: Two times a day (BID) | INTRAVENOUS | Status: DC
  Administered 2022-01-05 – 2022-01-10 (×10): 500 mg via INTRAVENOUS
  Filled 2022-01-05 (×11): qty 100

## 2022-01-05 MED ORDER — ACETAMINOPHEN 10 MG/ML IV SOLN
INTRAVENOUS | Status: DC | PRN
  Administered 2022-01-05: 1000 mg via INTRAVENOUS

## 2022-01-05 MED ORDER — CEFAZOLIN SODIUM-DEXTROSE 2-4 GM/100ML-% IV SOLN
INTRAVENOUS | Status: AC
  Filled 2022-01-05: qty 100

## 2022-01-05 MED ORDER — ONDANSETRON HCL 4 MG/2ML IJ SOLN
INTRAMUSCULAR | Status: AC
  Filled 2022-01-05: qty 2

## 2022-01-05 MED ORDER — DEXMEDETOMIDINE HCL IN NACL 80 MCG/20ML IV SOLN
INTRAVENOUS | Status: DC | PRN
  Administered 2022-01-05: 12 ug via BUCCAL
  Administered 2022-01-05: 8 ug via BUCCAL

## 2022-01-05 MED ORDER — ACETAMINOPHEN 10 MG/ML IV SOLN: 1000.0000 mg | Freq: Once | INTRAVENOUS | Status: DC | PRN

## 2022-01-05 MED ORDER — OXYCODONE HCL 5 MG/5ML PO SOLN: 5.0000 mg | Freq: Once | ORAL | Status: AC | PRN

## 2022-01-05 MED ORDER — MIDAZOLAM HCL 2 MG/2ML IJ SOLN
INTRAMUSCULAR | Status: AC
  Filled 2022-01-05: qty 2

## 2022-01-05 MED ORDER — FENTANYL CITRATE (PF) 100 MCG/2ML IJ SOLN
25.0000 ug | INTRAMUSCULAR | Status: DC | PRN
  Administered 2022-01-05: 50 ug via INTRAVENOUS

## 2022-01-05 MED ORDER — LIDOCAINE HCL (PF) 2 % IJ SOLN
INTRAMUSCULAR | Status: AC
  Filled 2022-01-05: qty 5

## 2022-01-05 MED ORDER — PROPRANOLOL HCL 10 MG PO TABS
20.0000 mg | ORAL_TABLET | Freq: Two times a day (BID) | ORAL | Status: DC
  Administered 2022-01-05 – 2022-01-06 (×2): 20 mg via ORAL
  Filled 2022-01-05 (×3): qty 2

## 2022-01-05 MED ORDER — HYDROMORPHONE HCL 1 MG/ML IJ SOLN
1.0000 mg | INTRAMUSCULAR | Status: DC | PRN
  Administered 2022-01-05: 1 mg via INTRAVENOUS
  Administered 2022-01-05 – 2022-01-06 (×5): 2 mg via INTRAVENOUS
  Administered 2022-01-06: 1 mg via INTRAVENOUS
  Administered 2022-01-06 – 2022-01-07 (×3): 2 mg via INTRAVENOUS
  Administered 2022-01-07: 1 mg via INTRAVENOUS
  Administered 2022-01-07 (×2): 2 mg via INTRAVENOUS
  Administered 2022-01-08: 1 mg via INTRAVENOUS
  Administered 2022-01-08: 2 mg via INTRAVENOUS
  Administered 2022-01-08: 1 mg via INTRAVENOUS
  Administered 2022-01-08: 2 mg via INTRAVENOUS
  Filled 2022-01-05 (×6): qty 2
  Filled 2022-01-05: qty 1
  Filled 2022-01-05: qty 2
  Filled 2022-01-05 (×3): qty 1
  Filled 2022-01-05 (×3): qty 2
  Filled 2022-01-05: qty 1
  Filled 2022-01-05 (×2): qty 2

## 2022-01-05 MED ORDER — PROPOFOL 10 MG/ML IV BOLUS
INTRAVENOUS | Status: AC
  Filled 2022-01-05: qty 20

## 2022-01-05 MED ORDER — ROCURONIUM BROMIDE 100 MG/10ML IV SOLN
INTRAVENOUS | Status: DC | PRN
  Administered 2022-01-05: 40 mg via INTRAVENOUS
  Administered 2022-01-05: 20 mg via INTRAVENOUS

## 2022-01-05 MED ORDER — DEXAMETHASONE SODIUM PHOSPHATE 10 MG/ML IJ SOLN
INTRAMUSCULAR | Status: AC
  Filled 2022-01-05: qty 1

## 2022-01-05 MED ORDER — POTASSIUM CHLORIDE IN NACL 20-0.9 MEQ/L-% IV SOLN
INTRAVENOUS | Status: DC
  Filled 2022-01-05 (×2): qty 1000

## 2022-01-05 MED ORDER — PROPOFOL 10 MG/ML IV BOLUS
INTRAVENOUS | Status: DC | PRN
  Administered 2022-01-05: 130 mg via INTRAVENOUS

## 2022-01-05 MED ORDER — FENTANYL CITRATE (PF) 100 MCG/2ML IJ SOLN
INTRAMUSCULAR | Status: DC | PRN
  Administered 2022-01-05: 100 ug via INTRAVENOUS

## 2022-01-05 MED ORDER — SUGAMMADEX SODIUM 200 MG/2ML IV SOLN
INTRAVENOUS | Status: DC | PRN
  Administered 2022-01-05: 200 mg via INTRAVENOUS

## 2022-01-05 MED ORDER — ACETAMINOPHEN 10 MG/ML IV SOLN
INTRAVENOUS | Status: AC
  Filled 2022-01-05: qty 100

## 2022-01-05 MED ORDER — OXYCODONE HCL 5 MG PO TABS
ORAL_TABLET | ORAL | Status: AC
  Administered 2022-01-05: 5 mg via ORAL
  Filled 2022-01-05: qty 1

## 2022-01-05 MED ORDER — SUCCINYLCHOLINE CHLORIDE 200 MG/10ML IV SOSY
PREFILLED_SYRINGE | INTRAVENOUS | Status: AC
  Filled 2022-01-05: qty 10

## 2022-01-05 MED ORDER — HYDROMORPHONE HCL 1 MG/ML IJ SOLN
INTRAMUSCULAR | Status: DC | PRN
  Administered 2022-01-05: 1 mg via INTRAVENOUS

## 2022-01-05 MED ORDER — HYDROMORPHONE HCL 1 MG/ML IJ SOLN
INTRAMUSCULAR | Status: AC
  Filled 2022-01-05: qty 1

## 2022-01-05 MED ORDER — SUCCINYLCHOLINE CHLORIDE 200 MG/10ML IV SOSY
PREFILLED_SYRINGE | INTRAVENOUS | Status: DC | PRN
  Administered 2022-01-05: 100 mg via INTRAVENOUS

## 2022-01-05 MED ORDER — FENTANYL CITRATE (PF) 100 MCG/2ML IJ SOLN
INTRAMUSCULAR | Status: AC
  Administered 2022-01-05: 50 ug via INTRAVENOUS
  Filled 2022-01-05: qty 2

## 2022-01-05 MED ORDER — 0.9 % SODIUM CHLORIDE (POUR BTL) OPTIME
TOPICAL | Status: DC | PRN
  Administered 2022-01-05: 500 mL

## 2022-01-05 MED ORDER — MIDAZOLAM HCL 2 MG/2ML IJ SOLN
INTRAMUSCULAR | Status: DC | PRN
  Administered 2022-01-05: 2 mg via INTRAVENOUS

## 2022-01-05 MED ORDER — DEXAMETHASONE SODIUM PHOSPHATE 10 MG/ML IJ SOLN
INTRAMUSCULAR | Status: DC | PRN
  Administered 2022-01-05: 5 mg via INTRAVENOUS

## 2022-01-05 MED ORDER — BUPIVACAINE-MELOXICAM ER 200-6 MG/7ML IJ SOLN
INTRAMUSCULAR | Status: DC | PRN
  Administered 2022-01-05: 200 mg

## 2022-01-05 MED ORDER — OXYCODONE HCL 5 MG PO TABS: 5.0000 mg | ORAL_TABLET | Freq: Once | ORAL | Status: AC | PRN

## 2022-01-05 MED ORDER — ONDANSETRON HCL 4 MG/2ML IJ SOLN
INTRAMUSCULAR | Status: DC | PRN
  Administered 2022-01-05: 4 mg via INTRAVENOUS

## 2022-01-05 SURGICAL SUPPLY — 40 items
CHLORAPREP W/TINT 26 (MISCELLANEOUS) ×1 IMPLANT
DRAPE LAPAROTOMY 100X77 ABD (DRAPES) ×1 IMPLANT
DRSG OPSITE POSTOP 4X10 (GAUZE/BANDAGES/DRESSINGS) ×1 IMPLANT
DRSG OPSITE POSTOP 4X8 (GAUZE/BANDAGES/DRESSINGS) ×1 IMPLANT
ELECT BLADE 6.5 EXT (BLADE) IMPLANT
ELECT CAUTERY BLADE 6.4 (BLADE) ×1 IMPLANT
ELECT REM PT RETURN 9FT ADLT (ELECTROSURGICAL) ×1
ELECTRODE REM PT RTRN 9FT ADLT (ELECTROSURGICAL) ×1 IMPLANT
GAUZE 4X4 16PLY ~~LOC~~+RFID DBL (SPONGE) ×1 IMPLANT
GLOVE BIOGEL PI IND STRL 7.0 (GLOVE) ×1 IMPLANT
GLOVE SURG SYN 6.5 ES PF (GLOVE) ×2 IMPLANT
GLOVE SURG SYN 6.5 PF PI (GLOVE) ×2 IMPLANT
GOWN STRL REUS W/ TWL LRG LVL3 (GOWN DISPOSABLE) ×2 IMPLANT
GOWN STRL REUS W/TWL LRG LVL3 (GOWN DISPOSABLE) ×2
KIT TURNOVER KIT A (KITS) ×1 IMPLANT
LABEL OR SOLS (LABEL) ×1 IMPLANT
LIGASURE IMPACT 36 18CM CVD LR (INSTRUMENTS) IMPLANT
MANIFOLD NEPTUNE II (INSTRUMENTS) ×1 IMPLANT
NEEDLE HYPO 22GX1.5 SAFETY (NEEDLE) IMPLANT
NS IRRIG 1000ML POUR BTL (IV SOLUTION) ×1 IMPLANT
PACK BASIN MAJOR ARMC (MISCELLANEOUS) ×1 IMPLANT
PACK COLON CLEAN CLOSURE (MISCELLANEOUS) ×1 IMPLANT
RELOAD PROXIMATE 75MM BLUE (ENDOMECHANICALS) IMPLANT
RELOAD STAPLE 75 3.8 BLU REG (ENDOMECHANICALS) IMPLANT
SPONGE T-LAP 18X18 ~~LOC~~+RFID (SPONGE) ×4 IMPLANT
STAPLER PROXIMATE 75MM BLUE (STAPLE) IMPLANT
STAPLER SKIN PROX 35W (STAPLE) ×1 IMPLANT
SUT PDS AB 1 TP1 54 (SUTURE) ×2 IMPLANT
SUT SILK 2 0 (SUTURE) ×1
SUT SILK 2-0 18XBRD TIE 12 (SUTURE) ×1 IMPLANT
SUT SILK 3 0 (SUTURE) ×1
SUT SILK 3-0 (SUTURE) IMPLANT
SUT SILK 3-0 18XBRD TIE 12 (SUTURE) ×1 IMPLANT
SUT VIC AB 3-0 SH 27 (SUTURE) ×2
SUT VIC AB 3-0 SH 27X BRD (SUTURE) ×2 IMPLANT
SYR 20ML LL LF (SYRINGE) IMPLANT
TRAP FLUID SMOKE EVACUATOR (MISCELLANEOUS) ×1 IMPLANT
TRAY FOLEY MTR SLVR 16FR STAT (SET/KITS/TRAYS/PACK) ×1 IMPLANT
TRAY FOLEY SLVR 16FR LF STAT (SET/KITS/TRAYS/PACK) IMPLANT
WATER STERILE IRR 500ML POUR (IV SOLUTION) ×1 IMPLANT

## 2022-01-05 NOTE — Progress Notes (Signed)
pt experiencing increase pain, administered Dilaudid 2 mg., explained to pt she will have to wait full 3hrs for pain med to next dose. she is tearful in pain. Acknowledged understanding

## 2022-01-05 NOTE — Progress Notes (Signed)
Subjective:  CC: Rhonda Palmer is a 51 y.o. female  Hospital stay day 3,   abdominal pain  HPI: Persistent abdominal pain more localized now.  Otherwise no other concerns of nausea vomiting.  ROS:  General: Denies weight loss, weight gain, fatigue, fevers, chills, and night sweats. Heart: Denies chest pain, palpitations, racing heart, irregular heartbeat, leg pain or swelling, and decreased activity tolerance. Respiratory: Denies breathing difficulty, shortness of breath, wheezing, cough, and sputum. GI: Denies change in appetite, heartburn, nausea, vomiting, constipation, diarrhea, and blood in stool. GU: Denies difficulty urinating, pain with urinating, urgency, frequency, blood in urine.   Objective:   Temp:  [98.3 F (36.8 C)-99.3 F (37.4 C)] 98.3 F (36.8 C) (12/10 0828) Pulse Rate:  [62-77] 66 (12/10 0828) Resp:  [18-20] 18 (12/10 0828) BP: (143-165)/(81-94) 165/92 (12/10 0828) SpO2:  [93 %-96 %] 96 % (12/10 0828)     Height: 5' (152.4 cm) Weight: 89.8 kg BMI (Calculated): 38.67   Intake/Output this shift:   Intake/Output Summary (Last 24 hours) at 01/05/2022 1005 Last data filed at 01/04/2022 1430 Gross per 24 hour  Intake 240 ml  Output --  Net 240 ml    Constitutional :  alert, cooperative, appears stated age, and no distress  Respiratory:  clear to auscultation bilaterally  Cardiovascular:  regular rate and rhythm  Gastrointestinal: Soft, no guarding, tenderness to palpation mostly has resolved except for area over normal fat necrosis in the left upper quadrant area slightly above the umbilicus.  This pain is consistently reproducible now and she denies any pain otherwise  Skin: Cool and moist.   Psychiatric: Normal affect, non-agitated, not confused       LABS:     Latest Ref Rng & Units 01/05/2022    6:00 AM 01/04/2022    5:01 AM 01/03/2022    6:11 AM  CMP  Glucose 70 - 99 mg/dL 91  107  100   BUN 6 - 20 mg/dL 6  8  13    Creatinine 0.44 - 1.00 mg/dL  0.32  0.41  0.42   Sodium 135 - 145 mmol/L 141  142  141   Potassium 3.5 - 5.1 mmol/L 3.2  3.9  4.1   Chloride 98 - 111 mmol/L 110  112  113   CO2 22 - 32 mmol/L 28  26  26    Calcium 8.9 - 10.3 mg/dL 9.1  9.3  9.2   Total Protein 6.5 - 8.1 g/dL 5.8  5.9    Total Bilirubin 0.3 - 1.2 mg/dL 1.3  1.1    Alkaline Phos 38 - 126 U/L 148  153    AST 15 - 41 U/L 20  32    ALT 0 - 44 U/L 68  99        Latest Ref Rng & Units 01/05/2022    6:00 AM 01/04/2022    5:01 AM 01/02/2022    3:44 AM  CBC  WBC 4.0 - 10.5 K/uL 6.8  7.5  7.5   Hemoglobin 12.0 - 15.0 g/dL 11.4  11.1  9.9   Hematocrit 36.0 - 46.0 % 35.9  35.6  31.6   Platelets 150 - 400 K/uL 200  184  160     RADS: N/a Assessment:    Persistent abdominal pain after episode of gastroenteritis.  Generalized abdominal pain has now resolved but she still has persistent abdominal pain over the area of known fat necrosis.  Due to the persistent nature of her pain, now  recommend proceeding with exploratory laparotomy and removal of this area of concern.  Patient is agreeable as well  The risk of surgery include, but not limited to, recurrence, bleeding, chronic pain, post-op infxn, post-op SBO or ileus, hernias, resection of bowel, re-anastamosis, possible ostomy placement and need for re-operation to address said risks. The risks of general anesthetic, if used, includes MI, CVA, sudden death or even reaction to anesthetic medications also discussed. Alternatives include continued observation.  Benefits include possible symptom relief.  Typical post-op recovery time of additional days in hospital for observation afterwards also discussed.  The patient verbalized understanding and all questions were answered to the patient's satisfaction.   labs/images/medications/previous chart entries reviewed personally and relevant changes/updates noted above.

## 2022-01-05 NOTE — Transfer of Care (Signed)
Immediate Anesthesia Transfer of Care Note  Patient: Rhonda Palmer  Procedure(s) Performed: EXPLORATORY LAPAROTOMY WITH REMOVALOF ABDOMINAL ABSCESS (Abdomen)  Patient Location: PACU  Anesthesia Type:General  Level of Consciousness: awake, alert , and oriented  Airway & Oxygen Therapy: Patient Spontanous Breathing and Patient connected to face mask oxygen  Post-op Assessment: Report given to RN, Post -op Vital signs reviewed and stable, and Patient moving all extremities  Post vital signs: Reviewed and stable  Last Vitals:  Vitals Value Taken Time  BP 121/108 01/05/22 1318  Temp 36.3 C 01/05/22 1318  Pulse 61 01/05/22 1323  Resp 16 01/05/22 1323  SpO2 96 % 01/05/22 1323  Vitals shown include unvalidated device data.  Last Pain:  Vitals:   01/05/22 0828  TempSrc: Oral  PainSc:          Complications: No notable events documented.

## 2022-01-05 NOTE — Plan of Care (Signed)

## 2022-01-05 NOTE — Anesthesia Procedure Notes (Signed)
Procedure Name: Intubation Date/Time: 01/05/2022 12:06 PM  Performed by: Katherine Basset, CRNAPre-anesthesia Checklist: Patient identified, Emergency Drugs available, Suction available and Patient being monitored Patient Re-evaluated:Patient Re-evaluated prior to induction Oxygen Delivery Method: Circle system utilized Preoxygenation: Pre-oxygenation with 100% oxygen Induction Type: IV induction and Rapid sequence Laryngoscope Size: Miller and 2 Grade View: Grade II Tube type: Oral Number of attempts: 1 Airway Equipment and Method: Stylet and Oral airway Placement Confirmation: ETT inserted through vocal cords under direct vision, positive ETCO2 and breath sounds checked- equal and bilateral Tube secured with: Tape Dental Injury: Teeth and Oropharynx as per pre-operative assessment

## 2022-01-05 NOTE — Anesthesia Postprocedure Evaluation (Signed)
Anesthesia Post Note  Patient: Rhonda Palmer  Procedure(s) Performed: EXPLORATORY LAPAROTOMY WITH REMOVALOF ABDOMINAL ABSCESS (Abdomen)  Patient location during evaluation: PACU Anesthesia Type: General Level of consciousness: awake and alert Pain management: pain level controlled Vital Signs Assessment: post-procedure vital signs reviewed and stable Respiratory status: spontaneous breathing, nonlabored ventilation, respiratory function stable and patient connected to nasal cannula oxygen Cardiovascular status: blood pressure returned to baseline and stable Postop Assessment: no apparent nausea or vomiting Anesthetic complications: no   No notable events documented.   Last Vitals:  Vitals:   01/05/22 1421 01/05/22 1426  BP:  (!) 156/90  Pulse: 79   Resp: 18   Temp:  37 C  SpO2: 98%     Last Pain:  Vitals:   01/05/22 1426  TempSrc:   PainSc: 4                  Corinda Gubler

## 2022-01-05 NOTE — Progress Notes (Signed)
    Patient: Rhonda Palmer NTI:[id number] DOB: [birth year] DOA: 01/01/2022 DOS: the patient was seen and examined on 01/05/2022 Primary service: Kathrynn Running, MD  Brief hospital course: Hx gastric sleeve presenting with abd pain and nausea/vomiting/diarrhea, found to have aki and elevated LFTS, the latter have improved/resolved with fluids and no further vomiting or diarrhea but sig abd pain persists, CT scan abnormal. Transient hypotension on 12/7 requiring pressors in setting of evening seroquel which has been held. Gen surg consutled 12/8  Assessment and Plan:  Hypotension Likely due to hypovolemia in setting of N/D, home seroquel likely contributed. Briefly on pressors 12/7, now discontinued and bp actually elevated  Acute kidney injury (HCC) Prerenal from vomiting/diarrhea, resolved - monitor  Acute gastroenteritis? No vomiting or diarrhea here in the hospital so no stool studies have been performed  Abdominal pain Fat necrosis Continues to complain of severe periumbilical/epigastric abd pain. CT with fat necrosis in that area, though stable. CT otherwise unremarkable. Trop neg, lipase wnl. GI consulted, thinks not likely to have significant gastric pathology. Gen surg also following - plan for ex lap today to resect the area of fat necrosis that may be the cause of her pain  Hypokalemia 2/2 vomiting/diarrhea, now 2/2 lack of po - start fluids with kcl  Transaminitis Likely shock liver as improving w/ fluids, hepatitis panel neg, nothing acute seen on ct - monitor  Hypothyroidism On 300 of levo at home, tsh here low to 0.05 - will decrease levo dose to 250, will need repeat tsh in 4-6 wks  Hypertension Meds initially held 2/2 hypotension now hypertensive - cont home propranolol - resume home lisinopril  Diabetes mellitus without complication (HCC) - Hold home medications - SSI, sensitive given poor p.o. intake  GAD - cont home propranolol,  buspar        TRH will continue to follow the patient.  Subjective: moderate abd pain, some nausea, no v/d  Physical Exam: Gen: nad Lungs: ctab save for faint rales at bases Heart: rrr, no murmurs Abd: soft, obese, mild ttp epigastrum and throughout Ext: warm, trace edema   Vitals:   01/04/22 1503 01/04/22 2051 01/05/22 0359 01/05/22 0828  BP: (!) 151/94 (!) 146/84 (!) 143/81 (!) 165/92  Pulse: 74 77 62 66  Resp: 18 18 20 18   Temp: 99.3 F (37.4 C) 98.8 F (37.1 C) 98.5 F (36.9 C) 98.3 F (36.8 C)  TempSrc:  Oral Oral Oral  SpO2: 93% 95% 95% 96%  Weight:      Height:        Data Reviewed:  There are no new results to review at this time.  Family Communication: husband updated telephonically 12/10   Author: 14/10, MD 01/05/2022 10:12 AM  For on call review [email or web address].

## 2022-01-05 NOTE — Anesthesia Preprocedure Evaluation (Signed)
Anesthesia Evaluation  Patient identified by MRN, date of birth, ID band Patient awake    Reviewed: Allergy & Precautions, NPO status , Patient's Chart, lab work & pertinent test results  History of Anesthesia Complications Negative for: history of anesthetic complications  Airway Mallampati: II  TM Distance: >3 FB Neck ROM: Full    Dental  (+) Upper Dentures, Chipped   Pulmonary neg pulmonary ROS, neg sleep apnea, neg COPD, Patient abstained from smoking.Not current smoker Childhood asthma, grew out of it   Pulmonary exam normal breath sounds clear to auscultation       Cardiovascular Exercise Tolerance: Good METShypertension, +CHF  (-) CAD and (-) Past MI (-) dysrhythmias  Rhythm:Regular Rate:Normal - Systolic murmurs TTE unremarkable, but cath showing severely elevated left ventricular filling pressures; medical management recommended in cath report   Neuro/Psych  Headaches PSYCHIATRIC DISORDERS Anxiety Depression Bipolar Disorder Schizophrenia     GI/Hepatic ,GERD  ,,(+)     (-) substance abuse  Fat necrosis on CT scan, thought to be potentially contributing to her severe abdominal pain and nausea/vomiting which brought her into the hospital   Endo/Other  diabetes, Well ControlledHypothyroidism  On weekly GLP1 injections, last taken 8 days ago  Renal/GU negative Renal ROS     Musculoskeletal  (+)  Fibromyalgia -  Abdominal  (+)  Abdomen: tender.   Peds  Hematology   Anesthesia Other Findings Past Medical History: No date: (HFpEF) heart failure with preserved ejection fraction (HCC)     Comment:  a. 02/2020 Echo: EF 55-60%, no rwma. Nl RV fxn. No date: Anxiety No date: Asthma No date: Bipolar 1 disorder (HCC) No date: Diabetes mellitus without complication (HCC) for twenty years per patient: Edema leg     Comment:  taking lasix for edema No date: Fibromyalgia No date: History of cardiac catheterization      Comment:  a. 02/2020 Cath: Nl cors. LVEDP -> torsemide               increased to 40mg  daily. No date: Hypertension No date: Hypothyroidism No date: Morbid obesity (HCC) No date: Ovarian cyst No date: Palpitations     Comment:  a. 03/2020 Zio: RSR, avg HR 84 (55-203), occas SVT runs.               No significant arrhythmias.  Most triggered events               associate with sinus rhythm. No date: Schizo affective schizophrenia (HCC)  Reproductive/Obstetrics                              Anesthesia Physical Anesthesia Plan  ASA: 3  Anesthesia Plan: General   Post-op Pain Management: Ofirmev IV (intra-op)* and Dilaudid IV   Induction: Intravenous and Rapid sequence  PONV Risk Score and Plan: 4 or greater and Ondansetron, Dexamethasone and Midazolam  Airway Management Planned: Oral ETT and Video Laryngoscope Planned  Additional Equipment: None  Intra-op Plan:   Post-operative Plan: Extubation in OR  Informed Consent: I have reviewed the patients History and Physical, chart, labs and discussed the procedure including the risks, benefits and alternatives for the proposed anesthesia with the patient or authorized representative who has indicated his/her understanding and acceptance.     Dental advisory given  Plan Discussed with: CRNA and Surgeon  Anesthesia Plan Comments: (Discussed risks of anesthesia with patient, including PONV, sore throat, lip/dental/eye damage. Rare risks discussed  as well, such as cardiorespiratory and neurological sequelae, and allergic reactions. Discussed the role of CRNA in patient's perioperative care. Patient understands.)         Anesthesia Quick Evaluation

## 2022-01-05 NOTE — Op Note (Signed)
Pre-Op Dx: Fat necrosis of abdominal wall/mesentery Post-Op Dx: Intra-abdominal abscess Anesthesia: GETA EBL: 15 Complications:  none apparent Specimen: Intra-abdominal abscess for culture and tissue exam   procedure: Exploratory laparotomy, excision of intra-abdominal abscess  Surgeon: Tonna Boehringer  Indications for procedure: Patient with persistent localized pain over the area of fat necrosis noted on prior CT.  Please see H&P for further details   Description of Procedure:  Consent obtained, time out performed.  Patient placed in supine position.  Foley placed area sterilized and draped in usual position.  Supraumbilical midline incision made and dissection carried down to fascia.  Fascia then incised and peritoneum entered.    Area of concern noted to be all within intra-abdominal cavity.  While attempting to dissect the palpable firmness and abnormal looking tissue off the abdominal wall, a cavity was entered and copious amounts of serous fluid followed by purulent fluid was noted to drain.  Area suctioned out completely with fluid sent for culture.  Once all of the drainage was removed, the surrounding tissue adhered to the abdominal wall was removed completely using a combination of blunt dissection and LigaSure.  The tissue consisted mostly of omental fat and portions of the peritoneal lining.  No obvious palpable mass was noted.  The abscess cavity and capsule then passed off the operative field pending pathology.  Inspection of the visible abdominal cavity and palpation within the area did not note any additional pathology.  Hemostasis was noted.  1-0 PDS x2 was then used to close the midline incision.  Zynrelef then infused along the incision site.  Skin approximated using interrupted 3-0 Vicryl.  Skin then closed with staples.  Wound then dressed with honeycomb dressing.  Pt tolerated procedure well, and transferred to PACU in stable condition, Foley removed.  Sponge and instrument count  correct at end of procedure.

## 2022-01-06 ENCOUNTER — Encounter: Payer: Self-pay | Admitting: Surgery

## 2022-01-06 DIAGNOSIS — K651 Peritoneal abscess: Secondary | ICD-10-CM | POA: Diagnosis present

## 2022-01-06 LAB — CBC
HCT: 37.7 % (ref 36.0–46.0)
Hemoglobin: 12.1 g/dL (ref 12.0–15.0)
MCH: 28.4 pg (ref 26.0–34.0)
MCHC: 32.1 g/dL (ref 30.0–36.0)
MCV: 88.5 fL (ref 80.0–100.0)
Platelets: 220 10*3/uL (ref 150–400)
RBC: 4.26 MIL/uL (ref 3.87–5.11)
RDW: 14.1 % (ref 11.5–15.5)
WBC: 11.7 10*3/uL — ABNORMAL HIGH (ref 4.0–10.5)
nRBC: 0 % (ref 0.0–0.2)

## 2022-01-06 LAB — COMPREHENSIVE METABOLIC PANEL
ALT: 54 U/L — ABNORMAL HIGH (ref 0–44)
AST: 17 U/L (ref 15–41)
Albumin: 3 g/dL — ABNORMAL LOW (ref 3.5–5.0)
Alkaline Phosphatase: 143 U/L — ABNORMAL HIGH (ref 38–126)
Anion gap: 7 (ref 5–15)
BUN: 6 mg/dL (ref 6–20)
CO2: 26 mmol/L (ref 22–32)
Calcium: 9 mg/dL (ref 8.9–10.3)
Chloride: 107 mmol/L (ref 98–111)
Creatinine, Ser: 0.43 mg/dL — ABNORMAL LOW (ref 0.44–1.00)
GFR, Estimated: >60 mL/min (ref >60)
Glucose, Bld: 95 mg/dL (ref 70–99)
Potassium: 3.5 mmol/L (ref 3.5–5.1)
Sodium: 140 mmol/L (ref 135–145)
Total Bilirubin: 1 mg/dL (ref 0.3–1.2)
Total Protein: 6 g/dL — ABNORMAL LOW (ref 6.5–8.1)

## 2022-01-06 LAB — GLUCOSE, CAPILLARY
Glucose-Capillary: 84 mg/dL (ref 70–99)
Glucose-Capillary: 106 mg/dL — ABNORMAL HIGH (ref 70–99)
Glucose-Capillary: 141 mg/dL — ABNORMAL HIGH (ref 70–99)
Glucose-Capillary: 101 mg/dL — ABNORMAL HIGH (ref 70–99)

## 2022-01-06 MED ORDER — ALUM & MAG HYDROXIDE-SIMETH 200-200-20 MG/5ML PO SUSP
30.0000 mL | ORAL | Status: DC | PRN
  Administered 2022-01-06 – 2022-01-10 (×3): 30 mL via ORAL
  Filled 2022-01-06 (×3): qty 30

## 2022-01-06 MED ORDER — IBUPROFEN 400 MG PO TABS
400.0000 mg | ORAL_TABLET | Freq: Three times a day (TID) | ORAL | Status: DC
  Administered 2022-01-06 – 2022-01-13 (×22): 400 mg via ORAL
  Filled 2022-01-06 (×22): qty 1

## 2022-01-06 MED ORDER — ACETAMINOPHEN 325 MG PO TABS
650.0000 mg | ORAL_TABLET | Freq: Four times a day (QID) | ORAL | Status: DC
  Administered 2022-01-06 – 2022-01-13 (×28): 650 mg via ORAL
  Filled 2022-01-06 (×29): qty 2

## 2022-01-06 MED ORDER — CYCLOBENZAPRINE HCL 10 MG PO TABS
5.0000 mg | ORAL_TABLET | Freq: Three times a day (TID) | ORAL | Status: DC
  Administered 2022-01-06: 5 mg via ORAL
  Filled 2022-01-06 (×2): qty 1

## 2022-01-06 NOTE — Progress Notes (Addendum)
Subjective:  CC: Rhonda Palmer is a 51 y.o. female  Hospital stay day 4, 1 Day Post-Op exploratory laparotomy, intra-abdominal abscess cavity removal  HPI: No acute issues overnight.  Complains of pain around incision site.  ROS:  General: Denies weight loss, weight gain, fatigue, fevers, chills, and night sweats. Heart: Denies chest pain, palpitations, racing heart, irregular heartbeat, leg pain or swelling, and decreased activity tolerance. Respiratory: Denies breathing difficulty, shortness of breath, wheezing, cough, and sputum. GI: Denies change in appetite, heartburn, nausea, vomiting, constipation, diarrhea, and blood in stool. GU: Denies difficulty urinating, pain with urinating, urgency, frequency, blood in urine.   Objective:   Temp:  [97.6 F (36.4 C)-98 F (36.7 C)] 97.6 F (36.4 C) (12/11 2015) Pulse Rate:  [64-81] 64 (12/11 2015) Resp:  [18] 18 (12/11 2015) BP: (94-136)/(67-79) 94/70 (12/11 2015) SpO2:  [88 %-99 %] 99 % (12/11 2015)     Height: 5' (152.4 cm) Weight: 89.8 kg BMI (Calculated): 38.67   Intake/Output this shift:   Intake/Output Summary (Last 24 hours) at 01/06/2022 2120 Last data filed at 01/06/2022 1401 Gross per 24 hour  Intake 895.7 ml  Output --  Net 895.7 ml    Constitutional :  alert, cooperative, appears stated age, and no distress  Respiratory:  clear to auscultation bilaterally  Cardiovascular:  regular rate and rhythm  Gastrointestinal: Soft, no guarding, tenderness to palpation around staple line.  As expected.  Skin: Cool and moist.   Psychiatric: Normal affect, non-agitated, not confused       LABS:     Latest Ref Rng & Units 01/06/2022    4:23 AM 01/05/2022    6:00 AM 01/04/2022    5:01 AM  CMP  Glucose 70 - 99 mg/dL 95  91  886   BUN 6 - 20 mg/dL 6  6  8    Creatinine 0.44 - 1.00 mg/dL  7.73  7.36   Sodium 135 - 145 mmol/L 140  141  142   Potassium 3.5 - 5.1 mmol/L 3.5  3.2  3.9   Chloride 98 - 111 mmol/L 107  110   112   CO2 22 - 32 mmol/L 26  28  26    Calcium 8.9 - 10.3 mg/dL 9.0  9.1  9.3   Total Protein 6.5 - 8.1 g/dL 6.0  5.8  5.9   Total Bilirubin 0.3 - 1.2 mg/dL 1.0  1.3  1.1   Alkaline Phos 38 - 126 U/L 143  148  153   AST 15 - 41 U/L 17  20  32   ALT 0 - 44 U/L 54  68  99       Latest Ref Rng & Units 01/06/2022    4:23 AM 01/05/2022    6:00 AM 01/04/2022    5:01 AM  CBC  WBC 4.0 - 10.5 K/uL 11.7  6.8  7.5   Hemoglobin 12.0 - 15.0 g/dL 14/10/2021  14/09/2021  59.4   Hematocrit 36.0 - 46.0 % 37.7  35.9  35.6   Platelets 150 - 400 K/uL 220  200  184     RADS: N/a Assessment:   Status post exploratory laparotomy, abdominal abscess cavity removal.  Recovering as expected.  Will start to transition off IV pain meds to oral meds in anticipation for discharge in the next day or so.  Tolerating diet otherwise.  Follow-up surgical pathology   labs/images/medications/previous chart entries reviewed personally and relevant changes/updates noted above.

## 2022-01-06 NOTE — Progress Notes (Signed)
    Patient: Rhonda Palmer FMM:[id number] DOB: Jul 31, 1970 DOA: 01/01/2022 DOS: the patient was seen and examined on 01/06/2022 Primary service: Kathrynn Running, MD  Brief hospital course: Hx gastric sleeve presenting with abd pain and nausea/vomiting/diarrhea, found to have aki and elevated LFTS, the latter have improved/resolved with fluids and no further vomiting or diarrhea but sig abd pain persists, CT scan abnormal. Transient hypotension on 12/7 requiring pressors in setting of evening seroquel which has been held. Gen surg consutled 12/8  Assessment and Plan:  Hypotension Likely due to hypovolemia in setting of N/D, home seroquel likely contributed. Briefly on pressors 12/7, now discontinued and bp actually elevated  Acute kidney injury (HCC) Prerenal from vomiting/diarrhea, resolved - monitor  Acute gastroenteritis? No vomiting or diarrhea here in the hospital so no stool studies have been performed  Abdominal pain Intra-abdominal abscess CT read as fat necrosis but during ex lab w/ gen surg on 12/10 abscess encountered which was drained. Etiology unclear - cont cipro/flagyl - f/u path and culture results - can possibly d/c tomorrow, pt consult today  Hypokalemia resolved  Transaminitis Likely shock liver as improving w/ fluids, hepatitis panel neg, nothing acute seen on ct - monitor  Hypothyroidism On 300 of levo at home, tsh here low to 0.05 - will decrease levo dose to 250, will need repeat tsh in 4-6 wks  Hypertension Normotensive today - cont home propranolol - resumed home lisinopril  Diabetes mellitus without complication (HCC) - Hold home medications - SSI, sensitive given poor p.o. intake  GAD - cont home propranolol, buspar        TRH will continue to follow the patient.  Subjective: moderate abd pain, some nausea, no v/d, tolerated diet  Physical Exam: Gen: nad Lungs: ctab save for faint rales at bases Heart: rrr, no murmurs Abd:  soft, obese, mild ttp epigastrum and throughout, incision c/d/i Ext: warm, trace edema   Vitals:   01/05/22 1654 01/05/22 1753 01/05/22 1924 01/06/22 0834  BP: (!) 158/102 (!) 150/98 122/87 136/79  Pulse: 81 74 78 78  Resp:   18   Temp:   98.5 F (36.9 C)   TempSrc:   Oral   SpO2:   92% 95%  Weight:      Height:        Data Reviewed:  There are no new results to review at this time.  Family Communication: husband updated telephonically 12/11   Author: Silvano Bilis, MD 01/06/2022 11:29 AM  For on call review [email or web address].

## 2022-01-06 NOTE — Evaluation (Signed)
Physical Therapy Evaluation Patient Details Name: Rhonda Palmer MRN: [id number] DOB: 08-17-1970 Today's Date: 01/06/2022  History of Present Illness  Pt admitted for abscess of abdominal cavity and is s/p ex lap and excision of absces on 01/05/22. Complaints of Abdominal pain with N/V/D. HIstory of gastric sleeve.  Clinical Impression  Pt is a pleasant 51 year old female who was admitted for ex lap and excision of intra abdominal abscess on 12/10. Pt demonstrates all bed mobility/transfers/ambulation at baseline level. Able to ambulate to bathroom and agreeable to sitting in recliner. Is limited by surgical pain. Pt does not require any further PT needs at this time. Pt will be dc in house and does not require follow up. RN aware. Will dc current orders. Educated to continue mobility efforts with Engineer, manufacturing.      Recommendations for follow up therapy are one component of a multi-disciplinary discharge planning process, led by the attending physician.  Recommendations may be updated based on patient status, additional functional criteria and insurance authorization.  Follow Up Recommendations No PT follow up      Assistance Recommended at Discharge Set up Supervision/Assistance  Patient can return home with the following  Assistance with cooking/housework;Assist for transportation;Help with stairs or ramp for entrance    Equipment Recommendations None recommended by PT  Recommendations for Other Services       Functional Status Assessment Patient has not had a recent decline in their functional status     Precautions / Restrictions Precautions Precautions: Fall Restrictions Weight Bearing Restrictions: No      Mobility  Bed Mobility Overal bed mobility: Independent             General bed mobility comments: safe technique. Cued for log roll.    Transfers Overall transfer level: Modified independent Equipment used: None               General transfer comment:  takes increased time. Safe technique    Ambulation/Gait Ambulation/Gait assistance: Modified independent (Device/Increase time) Gait Distance (Feet): 40 Feet Assistive device: None Gait Pattern/deviations: WFL(Within Functional Limits)       General Gait Details: ambulated with antalgic gait, however safe technique with no AD. Further distance limited secondary to pain. RN notified of pain med request  Stairs            Wheelchair Mobility    Modified Rankin (Stroke Patients Only)       Balance Overall balance assessment: Independent                                           Pertinent Vitals/Pain Pain Assessment Pain Assessment: 0-10 Pain Score: 6  Pain Location: abdomen Pain Descriptors / Indicators: Operative site guarding Pain Intervention(s): Limited activity within patient's tolerance, Patient requesting pain meds-RN notified, Monitored during session    Home Living Family/patient expects to be discharged to:: Private residence Living Arrangements: Children (daughter) Available Help at Discharge: Family (daughter's dad and other family members available as needed) Type of Home: House Home Access: Level entry       Home Layout: One level Home Equipment: Agricultural consultant (2 wheels)      Prior Function Prior Level of Function : Independent/Modified Independent             Mobility Comments: indep with all mobility and no recent falls       Hand Dominance  Extremity/Trunk Assessment   Upper Extremity Assessment Upper Extremity Assessment: Overall WFL for tasks assessed    Lower Extremity Assessment Lower Extremity Assessment: Overall WFL for tasks assessed       Communication   Communication: No difficulties  Cognition Arousal/Alertness: Awake/alert Behavior During Therapy: WFL for tasks assessed/performed Overall Cognitive Status: Within Functional Limits for tasks assessed                                  General Comments: agreeable to session        General Comments      Exercises Other Exercises Other Exercises: ambulated to bathroom. INdep with hygiene and mobility   Assessment/Plan    PT Assessment Patient does not need any further PT services  PT Problem List         PT Treatment Interventions      PT Goals (Current goals can be found in the Care Plan section)  Acute Rehab PT Goals Patient Stated Goal: to go home once the pain is controlled PT Goal Formulation: All assessment and education complete, DC therapy Time For Goal Achievement: 01/06/22 Potential to Achieve Goals: Good    Frequency       Co-evaluation               AM-PAC PT "6 Clicks" Mobility  Outcome Measure Help needed turning from your back to your side while in a flat bed without using bedrails?: None Help needed moving from lying on your back to sitting on the side of a flat bed without using bedrails?: None Help needed moving to and from a bed to a chair (including a wheelchair)?: None Help needed standing up from a chair using your arms (e.g., wheelchair or bedside chair)?: None Help needed to walk in hospital room?: None Help needed climbing 3-5 steps with a railing? : A Little 6 Click Score: 23    End of Session   Activity Tolerance: Patient limited by pain Patient left: in chair Nurse Communication: Mobility status PT Visit Diagnosis: Pain Pain - Right/Left:  (abdominal) Pain - part of body:  (abdomen)    Time: 1500-1530 PT Time Calculation (min) (ACUTE ONLY): 30 min   Charges:   PT Evaluation $PT Eval Low Complexity: 1 Low PT Treatments $Therapeutic Activity: 8-22 mins        Elizabeth Palau, PT, DPT, GCS [phone]   Rhonda Palmer 01/06/2022, 4:00 PM

## 2022-01-07 DIAGNOSIS — K651 Peritoneal abscess: Secondary | ICD-10-CM | POA: Diagnosis not present

## 2022-01-07 LAB — COMPREHENSIVE METABOLIC PANEL
ALT: 35 U/L (ref 0–44)
AST: 14 U/L — ABNORMAL LOW (ref 15–41)
Albumin: 3.1 g/dL — ABNORMAL LOW (ref 3.5–5.0)
Alkaline Phosphatase: 125 U/L (ref 38–126)
Anion gap: 5 (ref 5–15)
BUN: 11 mg/dL (ref 6–20)
CO2: 27 mmol/L (ref 22–32)
Calcium: 9.1 mg/dL (ref 8.9–10.3)
Chloride: 107 mmol/L (ref 98–111)
Creatinine, Ser: 1.22 mg/dL — ABNORMAL HIGH (ref 0.44–1.00)
GFR, Estimated: 53 mL/min — ABNORMAL LOW (ref >60)
Glucose, Bld: 120 mg/dL — ABNORMAL HIGH (ref 70–99)
Potassium: 3.1 mmol/L — ABNORMAL LOW (ref 3.5–5.1)
Sodium: 139 mmol/L (ref 135–145)
Total Bilirubin: 0.8 mg/dL (ref 0.3–1.2)
Total Protein: 5.9 g/dL — ABNORMAL LOW (ref 6.5–8.1)

## 2022-01-07 LAB — GLUCOSE, CAPILLARY
Glucose-Capillary: 100 mg/dL — ABNORMAL HIGH (ref 70–99)
Glucose-Capillary: 83 mg/dL (ref 70–99)
Glucose-Capillary: 89 mg/dL (ref 70–99)
Glucose-Capillary: 176 mg/dL — ABNORMAL HIGH (ref 70–99)
Glucose-Capillary: 120 mg/dL — ABNORMAL HIGH (ref 70–99)

## 2022-01-07 LAB — CBC
HCT: 35.4 % — ABNORMAL LOW (ref 36.0–46.0)
Hemoglobin: 11 g/dL — ABNORMAL LOW (ref 12.0–15.0)
MCH: 28.1 pg (ref 26.0–34.0)
MCHC: 31.1 g/dL (ref 30.0–36.0)
MCV: 90.3 fL (ref 80.0–100.0)
Platelets: 235 10*3/uL (ref 150–400)
RBC: 3.92 MIL/uL (ref 3.87–5.11)
RDW: 15 % (ref 11.5–15.5)
WBC: 9.3 10*3/uL (ref 4.0–10.5)
nRBC: 0 % (ref 0.0–0.2)

## 2022-01-07 LAB — SURGICAL PATHOLOGY

## 2022-01-07 MED ORDER — SODIUM CHLORIDE 0.9 % IV BOLUS
1000.0000 mL | Freq: Once | INTRAVENOUS | Status: AC
  Administered 2022-01-07: 1000 mL via INTRAVENOUS

## 2022-01-07 MED ORDER — POLYETHYLENE GLYCOL 3350 17 G PO PACK
34.0000 g | PACK | Freq: Every day | ORAL | Status: DC
  Administered 2022-01-07 – 2022-01-13 (×7): 34 g via ORAL
  Filled 2022-01-07 (×7): qty 2

## 2022-01-07 MED ORDER — SENNA 8.6 MG PO TABS
1.0000 | ORAL_TABLET | Freq: Every day | ORAL | Status: DC
  Administered 2022-01-07 – 2022-01-13 (×7): 8.6 mg via ORAL
  Filled 2022-01-07 (×7): qty 1

## 2022-01-07 MED ORDER — POTASSIUM CHLORIDE IN NACL 20-0.9 MEQ/L-% IV SOLN
INTRAVENOUS | Status: DC
  Filled 2022-01-07 (×8): qty 1000

## 2022-01-07 NOTE — Progress Notes (Signed)
   01/07/22 0426  Assess: MEWS Score  BP (!) 79/59  MAP (mmHg) (!) 64  Pulse Rate 66  Resp 13  SpO2 93 %  O2 Device Room Air  Assess: MEWS Score  MEWS Temp 0  MEWS Systolic 2  MEWS Pulse 0  MEWS RR 1  MEWS LOC 0  MEWS Score 3  MEWS Score Color Yellow  Assess: if the MEWS score is Yellow or Red  Were vital signs taken at a resting state? Yes  Focused Assessment Change from prior assessment (see assessment flowsheet)  Does the patient meet 2 or more of the SIRS criteria? No  MEWS guidelines implemented *See Row Information* Yes  Treat  MEWS Interventions Other (Comment) (bolus ordered)  Take Vital Signs  Increase Vital Sign Frequency  Yellow: Q 2hr X 2 then Q 4hr X 2, if remains yellow, continue Q 4hrs  Escalate  MEWS: Escalate Yellow: discuss with charge nurse/RN and consider discussing with provider and RRT  Provider Notification  Provider Name/Title Bishop Limbo NP  Date Provider Notified 01/07/22  Time Provider Notified 0430  Method of Notification Page  Notification Reason Critical Result;Change in status  Provider response See new orders  Date of Provider Response 01/07/22  Time of Provider Response 0500  Document  Patient Outcome Other (Comment) (continuing to monitor)  Progress note created (see row info) Yes  Assess: SIRS CRITERIA  SIRS Temperature  0  SIRS Pulse 0  SIRS Respirations  0  SIRS WBC 0  SIRS Score Sum  0

## 2022-01-07 NOTE — TOC Initial Note (Signed)
Transition of Care [facility]) - Initial/Assessment Note    Patient Details  Name: Rhonda Palmer MRN: [id number] Date of Birth: [birth year]  [facility] CM/SW Contact:    Chapman Fitch, RN Phone Number: 01/07/2022, 2:27 PM  Clinical Narrative:                  Admitted ZJI:RCVEL abdominal abscess  Admitted from: home with daughter  FYB:[id number] Current home health/prior home health/DME:RW  Therapy recommends no PT follow up Patient currently on IV antibiotics. Please consult TOC if home IV antibiotics needed.    Expected Discharge Plan: Home/Self Care Barriers to Discharge: Continued Medical Work up   Patient Goals and CMS Choice        Expected Discharge Plan and Services Expected Discharge Plan: Home/Self Care       Living arrangements for the past 2 months: Single Family Home                                      Prior Living Arrangements/Services Living arrangements for the past 2 months: Single Family Home Lives with:: Adult Children              Current home services: DME    Activities of Daily Living Home Assistive Devices/Equipment: None ADL Screening (condition at time of admission) Patient's cognitive ability adequate to safely complete daily activities?: Yes Is the patient deaf or have difficulty hearing?: No Does the patient have difficulty seeing, even when wearing glasses/contacts?: No Does the patient have difficulty concentrating, remembering, or making decisions?: No Patient able to express need for assistance with ADLs?: Yes Does the patient have difficulty dressing or bathing?: No Independently performs ADLs?: Yes (appropriate for developmental age) Does the patient have difficulty walking or climbing stairs?: No Weakness of Legs: None Weakness of Arms/Hands: None  Permission Sought/Granted                  Emotional Assessment       Orientation: : Oriented to Self, Oriented to Place, Oriented to  Time,  Oriented to Situation Alcohol / Substance Use: Not Applicable Psych Involvement: No (comment)  Admission diagnosis:  Weakness [R53.1] AKI (acute kidney injury) (HCC) [N17.9] Acute kidney injury (HCC) [N17.9] Acute kidney failure (HCC) [N17.9] Patient Active Problem List   Diagnosis Date Noted   Abscess of abdominal cavity (HCC) 01/06/2022   Nausea and vomiting 01/04/2022   Acute kidney failure (HCC) 01/02/2022   Acute kidney injury (HCC) 01/01/2022   Acute gastroenteritis 01/01/2022   Confusion    Weakness    Opioid withdrawal (HCC) 01/13/2021   Acute metabolic encephalopathy 01/11/2021   Abdominal pain 01/11/2021   Obesity, Class III, BMI 40-49.9 (morbid obesity) (HCC) 01/11/2021   Prolonged QT interval 01/11/2021   Diarrhea 01/11/2021   Hypoxemia 12/05/2020   Acute on chronic respiratory failure with hypoxemia (HCC) 12/04/2020   Unstable angina (HCC)    Chest pain 03/11/2020   Normocytic anemia 03/11/2020   Bipolar 1 disorder (HCC)    Depression with anxiety    Hypertension    HLD (hyperlipidemia)    Diabetes mellitus without complication (HCC)    Morbid obesity with BMI of 60.0-69.9, adult (HCC)    Acute respiratory failure with hypoxia (HCC)    Dyspnea on exertion    Long term (current) use of opiate analgesic 06/26/2016   Long term prescription opiate use 06/26/2016  Obesity 01/17/2016   History of opioid abuse (HCC) 01/17/2016   Asthma 01/17/2016   Mixed hyperlipidemia 10/09/2015   Hypokalemia 10/09/2015   Headache above the eye region 06/16/2013   Edema of extremities 05/18/2013   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 [address] Phone: [phone] Fax: [phone]     Social Determinants of Health (SDOH) Interventions    Readmission Risk Interventions    01/07/2022    2:24 PM 01/15/2021    3:47 PM  Readmission Risk Prevention Plan  Transportation Screening  Complete  PCP or Specialist Appt within 3-5 Days  Complete  Social Work Consult for Recovery Care Planning/Counseling Complete Complete  Palliative Care Screening Not Applicable Not Applicable  Medication Review Oceanographer) Complete Complete

## 2022-01-07 NOTE — Progress Notes (Signed)
    Patient: Rhonda Palmer XKG:[id number] DOB: 1970/07/24 DOA: 01/01/2022 DOS: the patient was seen and examined on 01/07/2022 Primary service: Kathrynn Running, MD  Brief hospital course: Hx gastric sleeve presenting with abd pain and nausea/vomiting/diarrhea, found to have aki and elevated LFTS, the latter have improved/resolved with fluids and no further vomiting or diarrhea but sig abd pain persists, CT scan abnormal. Transient hypotension on 12/7 requiring pressors in setting of evening seroquel which has been held. Gen surg consutled 12/8. Ex lap on 12/10 found intraabdominal abscess that was drained.   Assessment and Plan:  Hypotension Likely due to hypovolemia in setting of N/D, home seroquel likely contributed. Briefly on pressors 12/7, now discontinued. Overnight bp low again, relatively asymptomatic. Home bp meds held, given fluids. Will monitor next 24 hours  Acute kidney injury (HCC) Prerenal from vomiting/diarrhea, resolved, now with aki again, likely from limited PO yesterday - resume IVF - push PO  Acute gastroenteritis? Complained of vomiting and diarrhea prior to arrival, but no vomiting or diarrhea here in the hospital so no stool studies have been performed  Abdominal pain Intra-abdominal abscess CT read as fat necrosis but during ex lab w/ gen surg on 12/10 abscess encountered which was drained. Etiology unclear - cont cipro/flagyl - f/u path and culture results, ngtd - pt advising no home health needs  Hypokalemia Add KCl to fluids  Transaminitis Likely shock liver as resolved w/ fluids, hepatitis panel neg, nothing acute seen on ct  Hypothyroidism On 300 of levo at home, tsh here low to 0.05 - will decrease levo dose to 250, will need repeat tsh in 4-6 wks  Hypertension hypotensive today - stop home propranolol, lisinopril, for now  Diabetes mellitus without complication (HCC) - Holding home medications - SSI, sensitive given poor p.o.  intake  GAD - cont home  buspar        TRH will continue to follow the patient.  Subjective: moderate abd pain, some nausea, no v/d, tolerating diet but limited po yesterday  Physical Exam: Gen: nad Lungs: ctab save for faint rales at bases Heart: rrr, no murmurs Abd: soft, obese, mild ttp epigastrum and throughout, incision c/d/i Ext: warm, trace edema   Vitals:   01/07/22 0414 01/07/22 0426 01/07/22 0516 01/07/22 0739  BP: (!) 83/65 (!) 79/59 (!) 72/42 96/65  Pulse: 68 66 68 71  Resp: 18 13 11 18   Temp: 98 F (36.7 C)  98.3 F (36.8 C) 98.3 F (36.8 C)  TempSrc: Oral   Oral  SpO2: (!) 89% 93% 98% 96%  Weight:      Height:        Data Reviewed:  There are no new results to review at this time.  Family Communication: husband updated telephonically 12/12   Author: 14/12, MD 01/07/2022 12:32 PM  For on call review [email or web address].

## 2022-01-07 NOTE — Progress Notes (Signed)
       CROSS COVER NOTE  NAME: Rhonda Palmer MRN: [id number] DOB : 03-30-1970 ATTENDING PHYSICIAN: Kathrynn Running, MD    Date of Service   01/07/2022   HPI/Events of Note   Notified by nursing that Rhonda Palmer is hypotensive after dilaudid administration. BP 79/59 MAP 64. Patient is arousable and converses appropriately. Requested recheck of BP in an hour.  Follow up BP 72/42 MAP 52   Interventions   Assessment/Plan:  1L NS bolus     This document was prepared using Dragon voice recognition software and may include unintentional dictation errors.  Bishop Limbo DNP, MBA, FNP-BC Nurse Practitioner [facility] Pager [phone]

## 2022-01-07 NOTE — Progress Notes (Addendum)
Subjective:  CC: Rhonda Palmer is a 51 y.o. female  Hospital stay day 5, 2 Days Post-Op exploratory laparotomy, intra-abdominal abscess cavity removal  HPI: No acute issues overnight.  Still complains of pain around incision and excision site.  ROS:  General: Denies weight loss, weight gain, fatigue, fevers, chills, and night sweats. Heart: Denies chest pain, palpitations, racing heart, irregular heartbeat, leg pain or swelling, and decreased activity tolerance. Respiratory: Denies breathing difficulty, shortness of breath, wheezing, cough, and sputum. GI: Denies change in appetite, heartburn, nausea, vomiting, constipation, diarrhea, and blood in stool. GU: Denies difficulty urinating, pain with urinating, urgency, frequency, blood in urine.   Objective:   Temp:  [97.6 F (36.4 C)-98.3 F (36.8 C)] 98.3 F (36.8 C) (12/12 0739) Pulse Rate:  [64-81] 71 (12/12 0739) Resp:  [11-18] 18 (12/12 0739) BP: (72-96)/(42-70) 96/65 (12/12 0739) SpO2:  [88 %-99 %] 96 % (12/12 0739)     Height: 5' (152.4 cm) Weight: 89.8 kg BMI (Calculated): 38.67   Intake/Output this shift:   Intake/Output Summary (Last 24 hours) at 01/07/2022 1501 Last data filed at 01/07/2022 0500 Gross per 24 hour  Intake 0 ml  Output 0 ml  Net 0 ml    Constitutional :  alert, cooperative, appears stated age, and no distress  Respiratory:  clear to auscultation bilaterally  Cardiovascular:  regular rate and rhythm  Gastrointestinal: Soft, no guarding, tenderness to palpation around staple line.  As expected.  Tenderness around excision site as expected as well today  Skin: Cool and moist.   Psychiatric: Normal affect, non-agitated, not confused       LABS:     Latest Ref Rng & Units 01/07/2022    4:55 AM 01/06/2022    4:23 AM 01/05/2022    6:00 AM  CMP  Glucose 70 - 99 mg/dL 834  95  91   BUN 6 - 20 mg/dL 11  6  6    Creatinine 0.44 - 1.00 mg/dL  1.96  2.22   Sodium 135 - 145 mmol/L 139  140  141    Potassium 3.5 - 5.1 mmol/L 3.1  3.5  3.2   Chloride 98 - 111 mmol/L 107  107  110   CO2 22 - 32 mmol/L 27  26  28    Calcium 8.9 - 10.3 mg/dL 9.1  9.0  9.1   Total Protein 6.5 - 8.1 g/dL 5.9  6.0  5.8   Total Bilirubin 0.3 - 1.2 mg/dL 0.8  1.0  1.3   Alkaline Phos 38 - 126 U/L 125  143  148   AST 15 - 41 U/L 14  17  20    ALT 0 - 44 U/L 35  54  68       Latest Ref Rng & Units 01/07/2022    4:55 AM 01/06/2022    4:23 AM 01/05/2022    6:00 AM  CBC  WBC 4.0 - 10.5 K/uL 9.3  11.7  6.8   Hemoglobin 12.0 - 15.0 g/dL 14/12/2021  14/11/2021  14/10/2021   Hematocrit 36.0 - 46.0 % 35.4  37.7  35.9   Platelets 150 - 400 K/uL 235  220  200     PATHOLOGY SURGICAL PATHOLOGY CASE: [id number] PATIENT: 11.9 Surgical Pathology Report     Specimen Submitted: A. Abscess, abdominal cavity  Clinical History: Bowel obstruction      DIAGNOSIS: A.  ABSCESS, ABDOMINAL CAVITY; EXCISION: - FIBROADIPOSE TISSUE WITH FAT NECROSIS, REACTIVE FIBROSIS, AND MIXED INFLAMMATION. -  NEGATIVE FOR MALIGNANCY.  GROSS DESCRIPTION: A. Labeled: Abdominal abscess cavity Received: Fresh Collection time: 12:41 PM on 01/05/2022 Placed into formalin time: 1:29 PM on 01/05/2022 Tissue fragment(s): 2 Size: Aggregate, 7.5 x 5.5 x 4 cm Description: Received are yellow fibrofatty tissue fragments.  There is a 3.0 x 3.0 cm dull, hemorrhagic area on the surface of the larger fragment.  Sectioning shows yellow, glistening cut surfaces Representative sections are submitted in 2 cassettes.  CM 01/06/2022  Final Diagnosis performed by Elijah Birk, MD.   Electronically signed 01/07/2022 12:24:49PM The electronic signature indicates that the named Attending Pathologist has evaluated the specimen Technical component performed at [facility], [address] [place], [place] [address] Lab: [id number] Dir: Jolene Schimke, MD, MMM  Professional component performed at [facility], [facility], [address] [place],  [place], [place] [address] Lab: [phone] Dir: Beryle Quant, MD   RADS: N/a Assessment:   Status post exploratory laparotomy, abdominal abscess cavity removal.  Recovering as expected.  Will start to transition off IV pain meds to oral meds in anticipation for discharge in the next day or so.  Tolerating diet otherwise.  surgical pathology as noted above.   Continue pain control for now and further management of other chronic issues per hospitalist.  Surgery will continue to follow   labs/images/medications/previous chart entries reviewed personally and relevant changes/updates noted above.

## 2022-01-08 DIAGNOSIS — E1169 Type 2 diabetes mellitus with other specified complication: Secondary | ICD-10-CM

## 2022-01-08 DIAGNOSIS — K651 Peritoneal abscess: Secondary | ICD-10-CM | POA: Diagnosis not present

## 2022-01-08 DIAGNOSIS — N179 Acute kidney failure, unspecified: Secondary | ICD-10-CM | POA: Diagnosis not present

## 2022-01-08 LAB — CBC
HCT: 33.3 % — ABNORMAL LOW (ref 36.0–46.0)
Hemoglobin: 10.2 g/dL — ABNORMAL LOW (ref 12.0–15.0)
MCH: 28 pg (ref 26.0–34.0)
MCHC: 30.6 g/dL (ref 30.0–36.0)
MCV: 91.5 fL (ref 80.0–100.0)
Platelets: 227 10*3/uL (ref 150–400)
RBC: 3.64 MIL/uL — ABNORMAL LOW (ref 3.87–5.11)
RDW: 14.8 % (ref 11.5–15.5)
WBC: 10.9 10*3/uL — ABNORMAL HIGH (ref 4.0–10.5)
nRBC: 0 % (ref 0.0–0.2)

## 2022-01-08 LAB — GLUCOSE, CAPILLARY
Glucose-Capillary: 117 mg/dL — ABNORMAL HIGH (ref 70–99)
Glucose-Capillary: 137 mg/dL — ABNORMAL HIGH (ref 70–99)
Glucose-Capillary: 91 mg/dL (ref 70–99)
Glucose-Capillary: 91 mg/dL (ref 70–99)
Glucose-Capillary: 93 mg/dL (ref 70–99)

## 2022-01-08 MED ORDER — OXYCODONE-ACETAMINOPHEN 5-325 MG PO TABS
2.0000 | ORAL_TABLET | ORAL | Status: DC | PRN
  Administered 2022-01-08: 2 via ORAL
  Filled 2022-01-08 (×2): qty 2

## 2022-01-08 MED ORDER — OXYCODONE HCL 5 MG PO TABS
10.0000 mg | ORAL_TABLET | ORAL | Status: DC | PRN
  Administered 2022-01-08 – 2022-01-13 (×25): 10 mg via ORAL
  Filled 2022-01-08 (×25): qty 2

## 2022-01-08 MED ORDER — OXYCODONE HCL 5 MG PO TABS: 5.0000 mg | ORAL_TABLET | Freq: Once | ORAL | Status: DC

## 2022-01-08 MED ORDER — OXYCODONE-ACETAMINOPHEN 7.5-325 MG PO TABS
1.0000 | ORAL_TABLET | ORAL | Status: DC | PRN
  Administered 2022-01-08: 1 via ORAL
  Filled 2022-01-08: qty 1

## 2022-01-08 NOTE — Progress Notes (Signed)
PROGRESS NOTE    Rhonda Palmer  CHY:[id number] DOB: [birth year] DOA: 01/01/2022 PCP: Franciso Bend, NP   Assessment & Plan:   Principal Problem:   Abscess of abdominal cavity (HCC) Active Problems:   Long term (current) use of opiate analgesic   Acute kidney injury (HCC)   Acute gastroenteritis   Hypokalemia   Hypothyroidism   Hypertension   Diabetes mellitus without complication (HCC)   Schizoaffective disorder, bipolar type (HCC)   Obesity   Chronic pain syndrome   Asthma   Bipolar 1 disorder (HCC)   Acute kidney failure (HCC)   Nausea and vomiting  Assessment and Plan: Hypotension: likely due to hypovolemia in setting of N/D, home seroquel likely contributed. Briefly on pressors 12/7, now discontinued. Hx of HTN. Holding lisinopril, propranolol & torsemide. Resolved   AKI: likely from NSAIDs use. Will d/c NSAIDs if Cr continues to climb    Acute gastroenteritis: improved. Continue w/ supportive care   Intra-abdominal abscess: s/p drainage. Etiology unclear. Abd cx NGTD. Continue on IV flagyl & cipro    Hypokalemia: potassium repleated day prior    Transaminitis: likely secondary to shock liver. Resolved    Hypothyroidism: continue on decreased dose of levothyroxine. Will need repeat TSH in 4-6 weeks outpatient   DM2: well controlled, HbA1c 5.8. Continue on SSI w/ accuchecks    GAD: severity unknown. Continue on home dose of buspar       DVT prophylaxis: lovenox  Code Status: full  Family Communication:  Disposition Plan: likely d/c back home  Level of care: Med-Surg  Status is: Inpatient Remains inpatient appropriate because: severity of illness    Consultants:  Gen surg   Procedures:    Antimicrobials: cipro, flagyl   Subjective: Pt c/o abd pain   Objective: Vitals:   01/07/22 0739 01/07/22 1517 01/07/22 1956 01/08/22 0548  BP: 96/65 102/62 103/65 104/84  Pulse: 71 76 77 67  Resp: 18 14 16 16   Temp: 98.3 F (36.8 C) 98.4 F  (36.9 C) 98.3 F (36.8 C) 97.8 F (36.6 C)  TempSrc: Oral Oral Oral Oral  SpO2: 96% 96% 99% 98%  Weight:      Height:        Intake/Output Summary (Last 24 hours) at 01/08/2022 0847 Last data filed at 01/07/2022 2300 Gross per 24 hour  Intake 1243.41 ml  Output --  Net 1243.41 ml   Filed Weights   01/01/22 1629 01/01/22 2018  Weight: 89.8 kg 89.8 kg    Examination:  General exam: Appears calm and comfortable  Respiratory system: Clear to auscultation. Respiratory effort normal. Cardiovascular system: S1 & S2+. No rubs, gallops or clicks.  Gastrointestinal system: Abdomen is nondistended, soft and nontender. Normal bowel sounds heard. Central nervous system: Alert and oriented. Moves all extremities  Psychiatry: Judgement and insight appear normal. Flat mood and affect    Data Reviewed: I have personally reviewed following labs and imaging studies  CBC: Recent Labs  Lab 01/04/22 0501 01/05/22 0600 01/06/22 0423 01/07/22 0455 01/08/22 0201  WBC 7.5 6.8 11.7* 9.3 10.9*  HGB 11.1* 11.4* 12.1 11.0* 10.2*  HCT 35.6* 35.9* 37.7 35.4* 33.3*  MCV 89.2 87.3 88.5 90.3 91.5  PLT 184 200 220 235 227   Basic Metabolic Panel: Recent Labs  Lab 01/01/22 1140 01/02/22 0344 01/02/22 2253 01/03/22 0611 01/04/22 0501 01/05/22 0600 01/06/22 0423 01/07/22 0455  NA 138 136   < > 141 142 141 140 139  K 3.1* 3.3*   < >  4.1 3.9 3.2* 3.5 3.1*  CL 102 104   < > 113* 112* 110 107 107  CO2 23 26   < > 26 26 28 26 27   GLUCOSE 120* 99   < > 100* 107* 91 95 120*  BUN 29* 33*   < > 13 8 6 6 11   CREATININE 2.11* 1.82*   < > 0.42* 0.41* 0.32* 0.43* 1.22*  CALCIUM 8.9 8.3*   < > 9.2 9.3 9.1 9.0 9.1  MG 1.8 1.9  --   --   --   --   --   --    < > = values in this interval not displayed.   GFR: Estimated Creatinine Clearance: 53.2 mL/min (A) (by C-G formula based on SCr of 1.22 mg/dL (H)). Liver Function Tests: Recent Labs  Lab 01/02/22 2253 01/04/22 0501 01/05/22 0600  01/06/22 0423 01/07/22 0455  AST 90* 32 20 17 14*  ALT 159* 99* 68* 54* 35  ALKPHOS 176* 153* 148* 143* 125  BILITOT 1.0 1.1 1.3* 1.0 0.8  PROT 5.6* 5.9* 5.8* 6.0* 5.9*  ALBUMIN 3.1* 3.2* 3.1* 3.0* 3.1*   Recent Labs  Lab 01/04/22 0501  LIPASE 29   Recent Labs  Lab 01/02/22 0555  AMMONIA 14   Coagulation Profile: No results for input(s): "INR", "PROTIME" in the last 168 hours. Cardiac Enzymes: Recent Labs  Lab 01/01/22 1140  CKTOTAL 31*   BNP (last 3 results) No results for input(s): "PROBNP" in the last 8760 hours. HbA1C: No results for input(s): "HGBA1C" in the last 72 hours. CBG: Recent Labs  Lab 01/07/22 0742 01/07/22 1147 01/07/22 1522 01/07/22 2313 01/08/22 0742  GLUCAP 83 89 176* 120* 91   Lipid Profile: No results for input(s): "CHOL", "HDL", "LDLCALC", "TRIG", "CHOLHDL", "LDLDIRECT" in the last 72 hours. Thyroid Function Tests: No results for input(s): "TSH", "T4TOTAL", "FREET4", "T3FREE", "THYROIDAB" in the last 72 hours. Anemia Panel: No results for input(s): "VITAMINB12", "FOLATE", "FERRITIN", "TIBC", "IRON", "RETICCTPCT" in the last 72 hours. Sepsis Labs: Recent Labs  Lab 01/02/22 0344 01/02/22 0555 01/03/22 0611  PROCALCITON 0.75  --  <0.10  LATICACIDVEN  --  0.6  --     Recent Results (from the past 240 hour(s))  Resp Panel by RT-PCR (Flu A&B, Covid) Anterior Nasal Swab     Status: None   Collection Time: 01/01/22  2:41 PM   Specimen: Anterior Nasal Swab  Result Value Ref Range Status   SARS Coronavirus 2 by RT PCR NEGATIVE NEGATIVE Final    Comment: (NOTE) SARS-CoV-2 target nucleic acids are NOT DETECTED.  The SARS-CoV-2 RNA is generally detectable in upper respiratory specimens during the acute phase of infection. The lowest concentration of SARS-CoV-2 viral copies this assay can detect is 138 copies/mL. A negative result does not preclude SARS-Cov-2 infection and should not be used as the sole basis for treatment or other  patient management decisions. A negative result may occur with  improper specimen collection/handling, submission of specimen other than nasopharyngeal swab, presence of viral mutation(s) within the areas targeted by this assay, and inadequate number of viral copies(<138 copies/mL). A negative result must be combined with clinical observations, patient history, and epidemiological information. The expected result is Negative.  Fact Sheet for Patients:  14/08/23  Fact Sheet for Healthcare Providers:  14/06/23  This test is no t yet approved or cleared by the [employer] FDA and  has been authorized for detection and/or diagnosis of SARS-CoV-2 by FDA under an Emergency Use  Authorization (EUA). This EUA will remain  in effect (meaning this test can be used) for the duration of the COVID-19 declaration under Section 564(b)(1) of the Act, 21 U.S.C.section 360bbb-3(b)(1), unless the authorization is terminated  or revoked sooner.       Influenza A by PCR NEGATIVE NEGATIVE Final   Influenza B by PCR NEGATIVE NEGATIVE Final    Comment: (NOTE) The Xpert Xpress SARS-CoV-2/FLU/RSV plus assay is intended as an aid in the diagnosis of influenza from Nasopharyngeal swab specimens and should not be used as a sole basis for treatment. Nasal washings and aspirates are unacceptable for Xpert Xpress SARS-CoV-2/FLU/RSV testing.  Fact Sheet for Patients: [employer]  Fact Sheet for Healthcare Providers: [employer]ious[employer]ker[employer]it  This test is not yet approved or cleared by the [place] FDA and has been authorized for detection and/or diagnosis of SARS-CoV-2 by FDA under an Emergency Use Authorization (EUA). This EUA will remain in effect (meaning this test can be used) for the duration of the COVID-19 declaration under Section 564(b)(1) of the Act, 21 U.S.C. section  360bbb-3(b)(1), unless the authorization is terminated or revoked.  Performed at [facility], [address], [place], [place] [address]   Aerobic/Anaerobic Culture w Gram Stain (surgical/deep wound)     Status: None (Preliminary result)   Collection Time: 01/05/22 12:18 PM   Specimen: Abscess  Result Value Ref Range Status   Specimen Description   Final    ABSCESS Performed at [facility], [address] [place], [place] [address]    Special Requests ABDOMEN  Final   Gram Stain NO ORGANISMS SEEN NO WBC SEEN   Final   Culture   Final    NO GROWTH 2 DAYS NO ANAEROBES ISOLATED; CULTURE IN PROGRESS FOR 5 DAYS Performed at [facility], [address] [place], [place] [address]    Report Status PENDING  Incomplete         Radiology Studies: No results found.      Scheduled Meds:  acetaminophen  650 mg Oral Q6H   enoxaparin (LOVENOX) injection  0.5 mg/kg Subcutaneous Q24H   gabapentin  300 mg Oral TID   ibuprofen  400 mg Oral TID   insulin aspart  0-9 Units Subcutaneous TID WC   levothyroxine  250 mcg Oral Q0600   pantoprazole (PROTONIX) IV  40 mg Intravenous Q12H   polyethylene glycol  34 g Oral Daily   QUEtiapine  200 mg Oral QHS   senna  1 tablet Oral Daily   Continuous Infusions:  sodium chloride 0 mL (01/02/22 2248)   0.9 % NaCl with KCl 20 mEq / L 100 mL/hr at 01/08/22 0238   ciprofloxacin 400 mg (01/08/22 0545)   metronidazole 500 mg (01/08/22 0401)     LOS: 6 days    Time spent: 25 mins     Charise Killian, MD [facility] Pager [phone]  If 7PM-7AM, please contact night-coverage [email or web address] 01/08/2022, 8:47 AM

## 2022-01-08 NOTE — Progress Notes (Signed)
Subjective:  CC: Rhonda Palmer is a 51 y.o. female  Hospital stay day 6, 3 Days Post-Op exploratory laparotomy, intra-abdominal abscess cavity removal  HPI: Slowly weaning off IV pain meds.  Taking Percocet but states it does not last as long.  ROS:  General: Denies weight loss, weight gain, fatigue, fevers, chills, and night sweats. Heart: Denies chest pain, palpitations, racing heart, irregular heartbeat, leg pain or swelling, and decreased activity tolerance. Respiratory: Denies breathing difficulty, shortness of breath, wheezing, cough, and sputum. GI: Denies change in appetite, heartburn, nausea, vomiting, constipation, diarrhea, and blood in stool. GU: Denies difficulty urinating, pain with urinating, urgency, frequency, blood in urine.   Objective:   Temp:  [97.4 F (36.3 C)-98.3 F (36.8 C)] 97.4 F (36.3 C) (12/13 1516) Pulse Rate:  [67-78] 78 (12/13 1516) Resp:  [16-17] 17 (12/13 1516) BP: (103-112)/(65-84) 112/71 (12/13 1516) SpO2:  [98 %-100 %] 100 % (12/13 1516)     Height: 5' (152.4 cm) Weight: 89.8 kg BMI (Calculated): 38.67   Intake/Output this shift:   Intake/Output Summary (Last 24 hours) at 01/08/2022 1731 Last data filed at 01/08/2022 1418 Gross per 24 hour  Intake 3611.74 ml  Output --  Net 3611.74 ml    Constitutional :  alert, cooperative, appears stated age, and no distress  Respiratory:  clear to auscultation bilaterally  Cardiovascular:  regular rate and rhythm  Gastrointestinal: Soft, no guarding, tenderness to palpation around staple line.  As expected.   Skin: Cool and moist.   Psychiatric: Normal affect, non-agitated, not confused       LABS:     Latest Ref Rng & Units 01/07/2022    4:55 AM 01/06/2022    4:23 AM 01/05/2022    6:00 AM  CMP  Glucose 70 - 99 mg/dL 902  95  91   BUN 6 - 20 mg/dL 11  6  6    Creatinine 0.44 - 1.00 mg/dL  4.09  7.35   Sodium 135 - 145 mmol/L 139  140  141   Potassium 3.5 - 5.1 mmol/L 3.1  3.5  3.2    Chloride 98 - 111 mmol/L 107  107  110   CO2 22 - 32 mmol/L 27  26  28    Calcium 8.9 - 10.3 mg/dL 9.1  9.0  9.1   Total Protein 6.5 - 8.1 g/dL 5.9  6.0  5.8   Total Bilirubin 0.3 - 1.2 mg/dL 0.8  1.0  1.3   Alkaline Phos 38 - 126 U/L 125  143  148   AST 15 - 41 U/L 14  17  20    ALT 0 - 44 U/L 35  54  68       Latest Ref Rng & Units 01/08/2022    2:01 AM 01/07/2022    4:55 AM 01/06/2022    4:23 AM  CBC  WBC 4.0 - 10.5 K/uL 10.9  9.3  11.7   Hemoglobin 12.0 - 15.0 g/dL 01/10/2022  14/12/2021  14/11/2021   Hematocrit 36.0 - 46.0 % 33.3  35.4  37.7   Platelets 150 - 400 K/uL 227  235  220     PATHOLOGY SURGICAL PATHOLOGY CASE: [id number] PATIENT: 26.8 Surgical Pathology Report     Specimen Submitted: A. Abscess, abdominal cavity  Clinical History: Bowel obstruction      DIAGNOSIS: A.  ABSCESS, ABDOMINAL CAVITY; EXCISION: - FIBROADIPOSE TISSUE WITH FAT NECROSIS, REACTIVE FIBROSIS, AND MIXED INFLAMMATION. - NEGATIVE FOR MALIGNANCY.  GROSS DESCRIPTION: A.  Labeled: Abdominal abscess cavity Received: Fresh Collection time: 12:41 PM on 01/05/2022 Placed into formalin time: 1:29 PM on 01/05/2022 Tissue fragment(s): 2 Size: Aggregate, 7.5 x 5.5 x 4 cm Description: Received are yellow fibrofatty tissue fragments.  There is a 3.0 x 3.0 cm dull, hemorrhagic area on the surface of the larger fragment.  Sectioning shows yellow, glistening cut surfaces Representative sections are submitted in 2 cassettes.  CM 01/06/2022  Final Diagnosis performed by Elijah Birk, MD.   Electronically signed 01/07/2022 12:24:49PM The electronic signature indicates that the named Attending Pathologist has evaluated the specimen Technical component performed at [facility], [address] [place], [place] [address] Lab: [phone] Dir: Jolene Schimke, MD, MMM  Professional component performed at [facility], [facility], [address] [place], [place], [place] [address] Lab:  [phone] Dir: Beryle Quant, MD   RADS: N/a Assessment:   Status post exploratory laparotomy, abdominal abscess cavity removal.  Recovering as expected.  Transitioning to oral pain meds.  Continue pain control for now and further management of other chronic issues per hospitalist.  Surgery will continue to follow   labs/images/medications/previous chart entries reviewed personally and relevant changes/updates noted above.

## 2022-01-08 NOTE — Progress Notes (Signed)
       CROSS COVER NOTE  NAME: Rhonda Palmer MRN: [id number] DOB : Jun 27, 1970 ATTENDING PHYSICIAN: Charise Killian, MD    Date of Service   01/08/2022   HPI/Events of Note   Medication request received for patient report of 7/10 abdominal pain. EPIC is prompting RN that patient has received Max 24 hr Acetaminophen dose.  Interventions   Assessment/Plan:  5mg  IR Oxycodone x1       To reach the provider On-Call:   7AM- 7PM see care teams to locate the attending and reach out to them via www. . 7PM-7AM contact night-coverage If you still have difficulty reaching the appropriate provider, please page the [facility] (Director on Call) for [facility] on amion for assistance  This document was prepared using [facility] software and may include unintentional dictation errors.  Sales executive DNP, MBA, FNP-BC Nurse Practitioner [facility] Pager [phone]

## 2022-01-08 NOTE — Plan of Care (Signed)

## 2022-01-09 ENCOUNTER — Inpatient Hospital Stay: Payer: Medicaid Other

## 2022-01-09 ENCOUNTER — Encounter: Payer: Self-pay | Admitting: Internal Medicine

## 2022-01-09 LAB — BASIC METABOLIC PANEL
Anion gap: 3 — ABNORMAL LOW (ref 5–15)
BUN: 7 mg/dL (ref 6–20)
CO2: 23 mmol/L (ref 22–32)
Calcium: 8.9 mg/dL (ref 8.9–10.3)
Chloride: 117 mmol/L — ABNORMAL HIGH (ref 98–111)
Creatinine, Ser: 0.48 mg/dL (ref 0.44–1.00)
GFR, Estimated: >60 mL/min (ref >60)
Glucose, Bld: 93 mg/dL (ref 70–99)
Potassium: 3.6 mmol/L (ref 3.5–5.1)
Sodium: 143 mmol/L (ref 135–145)

## 2022-01-09 LAB — CBC
HCT: 37.5 % (ref 36.0–46.0)
Hemoglobin: 11.4 g/dL — ABNORMAL LOW (ref 12.0–15.0)
MCH: 28.3 pg (ref 26.0–34.0)
MCHC: 30.4 g/dL (ref 30.0–36.0)
MCV: 93.1 fL (ref 80.0–100.0)
Platelets: 245 10*3/uL (ref 150–400)
RBC: 4.03 MIL/uL (ref 3.87–5.11)
RDW: 15.1 % (ref 11.5–15.5)
WBC: 9.7 10*3/uL (ref 4.0–10.5)
nRBC: 0 % (ref 0.0–0.2)

## 2022-01-09 LAB — GLUCOSE, CAPILLARY
Glucose-Capillary: 101 mg/dL — ABNORMAL HIGH (ref 70–99)
Glucose-Capillary: 98 mg/dL (ref 70–99)
Glucose-Capillary: 85 mg/dL (ref 70–99)
Glucose-Capillary: 85 mg/dL (ref 70–99)
Glucose-Capillary: 84 mg/dL (ref 70–99)
Glucose-Capillary: 91 mg/dL (ref 70–99)
Glucose-Capillary: 83 mg/dL (ref 70–99)

## 2022-01-09 MED ORDER — DOCUSATE SODIUM 100 MG PO CAPS
100.0000 mg | ORAL_CAPSULE | Freq: Two times a day (BID) | ORAL | Status: DC
  Administered 2022-01-09 – 2022-01-13 (×9): 100 mg via ORAL
  Filled 2022-01-09 (×9): qty 1

## 2022-01-09 MED ORDER — CYCLOBENZAPRINE HCL 10 MG PO TABS
5.0000 mg | ORAL_TABLET | Freq: Two times a day (BID) | ORAL | Status: DC | PRN
  Filled 2022-01-09: qty 1

## 2022-01-09 MED ORDER — IOHEXOL 300 MG/ML  SOLN
100.0000 mL | Freq: Once | INTRAMUSCULAR | Status: AC | PRN
  Administered 2022-01-09: 100 mL via INTRAVENOUS

## 2022-01-09 MED ORDER — IOHEXOL 9 MG/ML PO SOLN
500.0000 mL | ORAL | Status: AC
  Administered 2022-01-09 (×2): 500 mL via ORAL

## 2022-01-09 NOTE — Progress Notes (Signed)
PROGRESS NOTE    Rhonda Palmer  JSH:[id number] DOB: 02-12-1970 DOA: 01/01/2022 PCP: Franciso Bend, NP   Assessment & Plan:   Principal Problem:   Abscess of abdominal cavity (HCC) Active Problems:   Long term (current) use of opiate analgesic   Acute kidney injury (HCC)   Acute gastroenteritis   Hypokalemia   Hypothyroidism   Hypertension   Diabetes mellitus without complication (HCC)   Schizoaffective disorder, bipolar type (HCC)   Obesity   Chronic pain syndrome   Asthma   Bipolar 1 disorder (HCC)   Acute kidney failure (HCC)   Nausea and vomiting  Assessment and Plan: Hypotension: likely due to hypovolemia in setting of N/D, home seroquel likely contributed. Briefly on pressors 12/7, now discontinued. Hx of HTN. Holding lisinopril, propranolol & torsemide. Resolved   AKI: resolved. Educated pt on NSAIDs ADRs    Acute gastroenteritis: improved. Continue w/ supportive care   Intra-abdominal abscess: s/p drainage. Etiology unclear. Abd cx NGTD. Continue on cipro, flagyl   Hypokalemia: WNL today    Transaminitis: likely secondary to shock liver. Resolved    Hypothyroidism: continue on decreased dose of levothyroxine. Need repeat TSH in 4-6 weeks outpatient    DM2: HbA1c 5.8, well controlled. Continue on SSI w/ accuchecks    GAD: severity unknown. Continue on home dose of buspar       DVT prophylaxis: lovenox  Code Status: full  Family Communication:  Disposition Plan: likely d/c back home  Level of care: Med-Surg  Status is: Inpatient Remains inpatient appropriate because: likely d/c home tomorrow     Consultants:  Gen surg   Procedures:    Antimicrobials: cipro, flagyl   Subjective: Pt c/o malaise    Objective: Vitals:   01/08/22 0548 01/08/22 1516 01/08/22 1959 01/09/22 0339  BP: 104/84 112/71 105/63 93/66  Pulse: 67 78 65 63  Resp: 16 17 16 16   Temp: 97.8 F (36.6 C) (!) 97.4 F (36.3 C) 97.6 F (36.4 C) (!) 97.5 F (36.4 C)   TempSrc: Oral  Oral Oral  SpO2: 98% 100% 99% 97%  Weight:      Height:        Intake/Output Summary (Last 24 hours) at 01/09/2022 0814 Last data filed at 01/09/2022 0307 Gross per 24 hour  Intake 5154.69 ml  Output --  Net 5154.69 ml   Filed Weights   01/01/22 1629 01/01/22 2018  Weight: 89.8 kg 89.8 kg    Examination:  General exam: Appears comfortable  Respiratory system: clear breath sounds b/l  Cardiovascular system: S1/S2+. No rubs or clicks  Gastrointestinal system: Abd is soft, NT, normal bowel sounds  Central nervous system: Alert and oriented. Moves all extremities  Psychiatry: judgement and insight appears normal. Flat mood and affect     Data Reviewed: I have personally reviewed following labs and imaging studies  CBC: Recent Labs  Lab 01/05/22 0600 01/06/22 0423 01/07/22 0455 01/08/22 0201 01/09/22 0600  WBC 6.8 11.7* 9.3 10.9* 9.7  HGB 11.4* 12.1 11.0* 10.2* 11.4*  HCT 35.9* 37.7 35.4* 33.3* 37.5  MCV 87.3 88.5 90.3 91.5 93.1  PLT 200 220 235 227 245   Basic Metabolic Panel: Recent Labs  Lab 01/04/22 0501 01/05/22 0600 01/06/22 0423 01/07/22 0455 01/09/22 0600  NA 142 141 140 139 143  K 3.9 3.2* 3.5 3.1* 3.6  CL 112* 110 107 107 117*  CO2 26 28 26 27 23   GLUCOSE 107* 91 95 120* 93  BUN 8 6 6  11  7  CREATININE 0.41* 0.32* 0.43* 1.22* 0.48  CALCIUM 9.3 9.1 9.0 9.1 8.9   GFR: Estimated Creatinine Clearance: 81.1 mL/min (by C-G formula based on SCr of 0.48 mg/dL). Liver Function Tests: Recent Labs  Lab 01/02/22 2253 01/04/22 0501 01/05/22 0600 01/06/22 0423 01/07/22 0455  AST 90* 32 20 17 14*  ALT 159* 99* 68* 54* 35  ALKPHOS 176* 153* 148* 143* 125  BILITOT 1.0 1.1 1.3* 1.0 0.8  PROT 5.6* 5.9* 5.8* 6.0* 5.9*  ALBUMIN 3.1* 3.2* 3.1* 3.0* 3.1*   Recent Labs  Lab 01/04/22 0501  LIPASE 29   No results for input(s): "AMMONIA" in the last 168 hours.  Coagulation Profile: No results for input(s): "INR", "PROTIME" in the last  168 hours. Cardiac Enzymes: No results for input(s): "CKTOTAL", "CKMB", "CKMBINDEX", "TROPONINI" in the last 168 hours.  BNP (last 3 results) No results for input(s): "PROBNP" in the last 8760 hours. HbA1C: No results for input(s): "HGBA1C" in the last 72 hours. CBG: Recent Labs  Lab 01/08/22 1121 01/08/22 1519 01/08/22 1956 01/08/22 2337 01/09/22 0336  GLUCAP 93 117* 137* 91 101*   Lipid Profile: No results for input(s): "CHOL", "HDL", "LDLCALC", "TRIG", "CHOLHDL", "LDLDIRECT" in the last 72 hours. Thyroid Function Tests: No results for input(s): "TSH", "T4TOTAL", "FREET4", "T3FREE", "THYROIDAB" in the last 72 hours. Anemia Panel: No results for input(s): "VITAMINB12", "FOLATE", "FERRITIN", "TIBC", "IRON", "RETICCTPCT" in the last 72 hours. Sepsis Labs: Recent Labs  Lab 01/03/22 8453  PROCALCITON <0.10    Recent Results (from the past 240 hour(s))  Resp Panel by RT-PCR (Flu A&B, Covid) Anterior Nasal Swab     Status: None   Collection Time: 01/01/22  2:41 PM   Specimen: Anterior Nasal Swab  Result Value Ref Range Status   SARS Coronavirus 2 by RT PCR NEGATIVE NEGATIVE Final    Comment: (NOTE) SARS-CoV-2 target nucleic acids are NOT DETECTED.  The SARS-CoV-2 RNA is generally detectable in upper respiratory specimens during the acute phase of infection. The lowest concentration of SARS-CoV-2 viral copies this assay can detect is 138 copies/mL. A negative result does not preclude SARS-Cov-2 infection and should not be used as the sole basis for treatment or other patient management decisions. A negative result may occur with  improper specimen collection/handling, submission of specimen other than nasopharyngeal swab, presence of viral mutation(s) within the areas targeted by this assay, and inadequate number of viral copies(<138 copies/mL). A negative result must be combined with clinical observations, patient history, and epidemiological information. The expected  result is Negative.  Fact Sheet for Patients:  BloggerCourse.com  Fact Sheet for Healthcare Providers:  SeriousBroker.it  This test is no t yet approved or cleared by the [place] FDA and  has been authorized for detection and/or diagnosis of SARS-CoV-2 by FDA under an Emergency Use Authorization (EUA). This EUA will remain  in effect (meaning this test can be used) for the duration of the COVID-19 declaration under Section 564(b)(1) of the Act, 21 U.S.C.section 360bbb-3(b)(1), unless the authorization is terminated  or revoked sooner.       Influenza A by PCR NEGATIVE NEGATIVE Final   Influenza B by PCR NEGATIVE NEGATIVE Final    Comment: (NOTE) The Xpert Xpress SARS-CoV-2/FLU/RSV plus assay is intended as an aid in the diagnosis of influenza from Nasopharyngeal swab specimens and should not be used as a sole basis for treatment. Nasal washings and aspirates are unacceptable for Xpert Xpress SARS-CoV-2/FLU/RSV testing.  Fact Sheet for Patients: BloggerCourse.com  Fact  Sheet for Healthcare Providers: SeriousBroker.it  This test is not yet approved or cleared by the [place] and has been authorized for detection and/or diagnosis of SARS-CoV-2 by FDA under an Emergency Use Authorization (EUA). This EUA will remain in effect (meaning this test can be used) for the duration of the COVID-19 declaration under Section 564(b)(1) of the Act, 21 U.S.C. section 360bbb-3(b)(1), unless the authorization is terminated or revoked.  Performed at [facility], [address], [place], [place] [address]   Aerobic/Anaerobic Culture w Gram Stain (surgical/deep wound)     Status: None (Preliminary result)   Collection Time: 01/05/22 12:18 PM   Specimen: Abscess  Result Value Ref Range Status   Specimen Description   Final    ABSCESS Performed at [facility],  [address] [place], [place] [address]    Special Requests ABDOMEN  Final   Gram Stain NO ORGANISMS SEEN NO WBC SEEN   Final   Culture   Final    NO GROWTH 3 DAYS NO ANAEROBES ISOLATED; CULTURE IN PROGRESS FOR 5 DAYS Performed at [facility], [address] [place], [place] [address]    Report Status PENDING  Incomplete         Radiology Studies: No results found.      Scheduled Meds:  acetaminophen  650 mg Oral Q6H   enoxaparin (LOVENOX) injection  0.5 mg/kg Subcutaneous Q24H   gabapentin  300 mg Oral TID   ibuprofen  400 mg Oral TID   insulin aspart  0-9 Units Subcutaneous TID WC   levothyroxine  250 mcg Oral Q0600   pantoprazole (PROTONIX) IV  40 mg Intravenous Q12H   polyethylene glycol  34 g Oral Daily   QUEtiapine  200 mg Oral QHS   senna  1 tablet Oral Daily   Continuous Infusions:  sodium chloride 0 mL (01/02/22 2248)   0.9 % NaCl with KCl 20 mEq / L 100 mL/hr at 01/09/22 0307   ciprofloxacin 400 mg (01/09/22 0346)   metronidazole 500 mg (01/09/22 0508)     LOS: 7 days    Time spent: 25 mins     Charise Killian, MD [facility] Pager [phone]  If 7PM-7AM, please contact night-coverage [email or web address] 01/09/2022, 8:14 AM

## 2022-01-09 NOTE — Plan of Care (Signed)
  Problem: Education: Goal: Ability to describe self-care measures that may prevent or decrease complications (Diabetes Survival Skills Education) will improve Outcome: Progressing Goal: Individualized Educational Video(s) Outcome: Progressing   Problem: Skin Integrity: Goal: Risk for impaired skin integrity will decrease Outcome: Progressing   Problem: Education: Goal: Knowledge of General Education information will improve Description: Including pain rating scale, medication(s)/side effects and non-pharmacologic comfort measures Outcome: Progressing   Problem: Health Behavior/Discharge Planning: Goal: Ability to manage health-related needs will improve Outcome: Progressing   Problem: Pain Managment: Goal: General experience of comfort will improve Outcome: Progressing   Problem: Safety: Goal: Ability to remain free from injury will improve Outcome: Progressing

## 2022-01-09 NOTE — TOC Progression Note (Signed)
Transition of Care [facility]) - Progression Note    Patient Details  Name: Rhonda Palmer MRN: [id number] Date of Birth: 01-16-1971  Transition of Care [facility]) CM/SW Contact  Chapman Fitch, RN Phone Number: 01/09/2022, 12:10 PM  Clinical Narrative:      Confirmed with MD that patient will be discharge on oral antibiotics not IV Expected Discharge Plan: Home/Self Care Barriers to Discharge: Continued Medical Work up  Expected Discharge Plan and Services Expected Discharge Plan: Home/Self Care       Living arrangements for the past 2 months: Single Family Home                                       Social Determinants of Health (SDOH) Interventions    Readmission Risk Interventions    01/07/2022    2:24 PM 01/15/2021    3:47 PM  Readmission Risk Prevention Plan  Transportation Screening  Complete  PCP or Specialist Appt within 3-5 Days  Complete  Social Work Consult for Recovery Care Planning/Counseling Complete Complete  Palliative Care Screening Not Applicable Not Applicable  Medication Review Oceanographer) Complete Complete

## 2022-01-09 NOTE — Progress Notes (Signed)
Subjective:  CC: Rhonda Palmer is a 51 y.o. female  Hospital stay day 7, 4 Days Post-Op exploratory laparotomy, intra-abdominal abscess cavity removal  HPI: Slowly weaning off IV pain meds, but still having persistent pain.  ROS:  General: Denies weight loss, weight gain, fatigue, fevers, chills, and night sweats. Heart: Denies chest pain, palpitations, racing heart, irregular heartbeat, leg pain or swelling, and decreased activity tolerance. Respiratory: Denies breathing difficulty, shortness of breath, wheezing, cough, and sputum. GI: Denies change in appetite, heartburn, nausea, vomiting, constipation, diarrhea, and blood in stool. GU: Denies difficulty urinating, pain with urinating, urgency, frequency, blood in urine.   Objective:   Temp:  [97.3 F (36.3 C)-98.5 F (36.9 C)] 97.3 F (36.3 C) (12/14 1511) Pulse Rate:  [63-77] 66 (12/14 1511) Resp:  [16] 16 (12/14 0339) BP: (93-134)/(63-91) 134/68 (12/14 1511) SpO2:  [97 %-99 %] 98 % (12/14 1511)     Height: 5' (152.4 cm) Weight: 89.8 kg BMI (Calculated): 38.67   Intake/Output this shift:   Intake/Output Summary (Last 24 hours) at 01/09/2022 1608 Last data filed at 01/09/2022 0307 Gross per 24 hour  Intake 1782.95 ml  Output --  Net 1782.95 ml    Constitutional :  alert, cooperative, appears stated age, and no distress  Respiratory:  clear to auscultation bilaterally  Cardiovascular:  regular rate and rhythm  Gastrointestinal: Soft, no guarding, tenderness to palpation around staple line.  Persistent  Skin: Cool and moist.   Psychiatric: Normal affect, non-agitated, not confused       LABS:     Latest Ref Rng & Units 01/09/2022    6:00 AM 01/07/2022    4:55 AM 01/06/2022    4:23 AM  CMP  Glucose 70 - 99 mg/dL 93  147  95   BUN 6 - 20 mg/dL 7  11  6    Creatinine 0.44 - 1.00 mg/dL  8.29  5.62   Sodium 135 - 145 mmol/L 143  139  140   Potassium 3.5 - 5.1 mmol/L 3.6  3.1  3.5   Chloride 98 - 111 mmol/L  117  107  107   CO2 22 - 32 mmol/L 23  27  26    Calcium 8.9 - 10.3 mg/dL 8.9  9.1  9.0   Total Protein 6.5 - 8.1 g/dL  5.9  6.0   Total Bilirubin 0.3 - 1.2 mg/dL  0.8  1.0   Alkaline Phos 38 - 126 U/L  125  143   AST 15 - 41 U/L  14  17   ALT 0 - 44 U/L  35  54       Latest Ref Rng & Units 01/09/2022    6:00 AM 01/08/2022    2:01 AM 01/07/2022    4:55 AM  CBC  WBC 4.0 - 10.5 K/uL 9.7  10.9  9.3   Hemoglobin 12.0 - 15.0 g/dL 01/10/2022  14/12/2021  86.5   Hematocrit 36.0 - 46.0 % 37.5  33.3  35.4   Platelets 150 - 400 K/uL 245  227  235     PATHOLOGY SURGICAL PATHOLOGY CASE: [id number] PATIENT: [age] Surgical Pathology Report     Specimen Submitted: A. Abscess, abdominal cavity  Clinical History: Bowel obstruction      DIAGNOSIS: A.  ABSCESS, ABDOMINAL CAVITY; EXCISION: - FIBROADIPOSE TISSUE WITH FAT NECROSIS, REACTIVE FIBROSIS, AND MIXED INFLAMMATION. - NEGATIVE FOR MALIGNANCY.  GROSS DESCRIPTION: A. Labeled: Abdominal abscess cavity Received: Fresh Collection time: 12:41 PM on 01/05/2022 Placed  into formalin time: 1:29 PM on 01/05/2022 Tissue fragment(s): 2 Size: Aggregate, 7.5 x 5.5 x 4 cm Description: Received are yellow fibrofatty tissue fragments.  There is a 3.0 x 3.0 cm dull, hemorrhagic area on the surface of the larger fragment.  Sectioning shows yellow, glistening cut surfaces Representative sections are submitted in 2 cassettes.  CM 01/06/2022  Final Diagnosis performed by Quay Burow, MD.   Electronically signed 01/07/2022 12:24:49PM The electronic signature indicates that the named Attending Pathologist has evaluated the specimen Technical component performed at [facility], [address], [place], [place] [address] Lab: [phone] Dir: Rush Farmer, MD, MMM  Professional component performed at [facility], [facility], [place], [place], [place] [address] Lab: [phone] Dir: Kathi Simpers, MD    RADS: N/a Assessment:   Status post exploratory laparotomy, abdominal abscess cavity removal.    Pain control is still an issue 4 days postop now.  Pain still localized around staple site where there is a hematoma that has developed, but the lack of improvement over the past couple days concerning for possible intra-abdominal source.  Will repeat CT scan to further assess.    labs/images/medications/previous chart entries reviewed personally and relevant changes/updates noted above.

## 2022-01-10 DIAGNOSIS — Z79891 Long term (current) use of opiate analgesic: Secondary | ICD-10-CM | POA: Diagnosis not present

## 2022-01-10 DIAGNOSIS — K651 Peritoneal abscess: Secondary | ICD-10-CM | POA: Diagnosis not present

## 2022-01-10 DIAGNOSIS — N179 Acute kidney failure, unspecified: Secondary | ICD-10-CM | POA: Diagnosis not present

## 2022-01-10 DIAGNOSIS — F250 Schizoaffective disorder, bipolar type: Secondary | ICD-10-CM | POA: Diagnosis not present

## 2022-01-10 LAB — GLUCOSE, CAPILLARY
Glucose-Capillary: 115 mg/dL — ABNORMAL HIGH (ref 70–99)
Glucose-Capillary: 77 mg/dL (ref 70–99)
Glucose-Capillary: 80 mg/dL (ref 70–99)
Glucose-Capillary: 80 mg/dL (ref 70–99)
Glucose-Capillary: 90 mg/dL (ref 70–99)
Glucose-Capillary: 90 mg/dL (ref 70–99)

## 2022-01-10 LAB — AEROBIC/ANAEROBIC CULTURE W GRAM STAIN (SURGICAL/DEEP WOUND)
Culture: NO GROWTH
Gram Stain: NONE SEEN

## 2022-01-10 LAB — BASIC METABOLIC PANEL
Anion gap: 2 — ABNORMAL LOW (ref 5–15)
BUN: <5 mg/dL — ABNORMAL LOW (ref 6–20)
CO2: 23 mmol/L (ref 22–32)
Calcium: 8.5 mg/dL — ABNORMAL LOW (ref 8.9–10.3)
Chloride: 117 mmol/L — ABNORMAL HIGH (ref 98–111)
Creatinine, Ser: 0.36 mg/dL — ABNORMAL LOW (ref 0.44–1.00)
GFR, Estimated: >60 mL/min (ref >60)
Glucose, Bld: 94 mg/dL (ref 70–99)
Potassium: 3.4 mmol/L — ABNORMAL LOW (ref 3.5–5.1)
Sodium: 142 mmol/L (ref 135–145)

## 2022-01-10 LAB — CBC
HCT: 34.2 % — ABNORMAL LOW (ref 36.0–46.0)
Hemoglobin: 10.5 g/dL — ABNORMAL LOW (ref 12.0–15.0)
MCH: 28 pg (ref 26.0–34.0)
MCHC: 30.7 g/dL (ref 30.0–36.0)
MCV: 91.2 fL (ref 80.0–100.0)
Platelets: 277 10*3/uL (ref 150–400)
RBC: 3.75 MIL/uL — ABNORMAL LOW (ref 3.87–5.11)
RDW: 15 % (ref 11.5–15.5)
WBC: 6.7 10*3/uL (ref 4.0–10.5)
nRBC: 0 % (ref 0.0–0.2)

## 2022-01-10 MED ORDER — PANTOPRAZOLE SODIUM 40 MG PO TBEC
40.0000 mg | DELAYED_RELEASE_TABLET | Freq: Every day | ORAL | Status: DC
  Administered 2022-01-10 – 2022-01-12 (×3): 40 mg via ORAL
  Filled 2022-01-10 (×3): qty 1

## 2022-01-10 MED ORDER — POTASSIUM CHLORIDE CRYS ER 20 MEQ PO TBCR
40.0000 meq | EXTENDED_RELEASE_TABLET | Freq: Once | ORAL | Status: AC
  Administered 2022-01-10: 40 meq via ORAL
  Filled 2022-01-10: qty 2

## 2022-01-10 MED ORDER — POTASSIUM CHLORIDE IN NACL 40-0.9 MEQ/L-% IV SOLN
INTRAVENOUS | Status: DC
  Filled 2022-01-10 (×8): qty 1000

## 2022-01-10 NOTE — Plan of Care (Signed)
  Problem: Education: Goal: Ability to describe self-care measures that may prevent or decrease complications (Diabetes Survival Skills Education) will improve Outcome: Progressing Goal: Individualized Educational Video(s) Outcome: Progressing   Problem: Coping: Goal: Ability to adjust to condition or change in health will improve Outcome: Progressing   Problem: Fluid Volume: Goal: Ability to maintain a balanced intake and output will improve Outcome: Progressing   Problem: Health Behavior/Discharge Planning: Goal: Ability to identify and utilize available resources and services will improve Outcome: Progressing Goal: Ability to manage health-related needs will improve Outcome: Progressing   Problem: Nutritional: Goal: Maintenance of adequate nutrition will improve Outcome: Progressing Goal: Progress toward achieving an optimal weight will improve Outcome: Progressing   Problem: Skin Integrity: Goal: Risk for impaired skin integrity will decrease Outcome: Progressing   Problem: Tissue Perfusion: Goal: Adequacy of tissue perfusion will improve Outcome: Progressing   Problem: Education: Goal: Knowledge of General Education information will improve Description: Including pain rating scale, medication(s)/side effects and non-pharmacologic comfort measures Outcome: Progressing   Problem: Clinical Measurements: Goal: Ability to maintain clinical measurements within normal limits will improve Outcome: Progressing Goal: Will remain free from infection Outcome: Progressing Goal: Diagnostic test results will improve Outcome: Progressing Goal: Respiratory complications will improve Outcome: Progressing Goal: Cardiovascular complication will be avoided Outcome: Progressing

## 2022-01-10 NOTE — Progress Notes (Signed)
PHARMACIST - PHYSICIAN COMMUNICATION  CONCERNING: Antibiotic IV to Oral Route Change Policy  RECOMMENDATION: This patient is receiving pantoprazole by the intravenous route.  Based on criteria approved by the Pharmacy and Therapeutics Committee, the antibiotic(s) is/are being converted to the equivalent oral dose form(s).   DESCRIPTION: These criteria include: Patient being treated for a respiratory tract infection, urinary tract infection, cellulitis or clostridium difficile associated diarrhea if on metronidazole The patient is not neutropenic and does not exhibit a GI malabsorption state The patient is eating (either orally or via tube) and/or has been taking other orally administered medications for a least 24 hours The patient is improving clinically and has a Tmax < 100.5    Elliot Gurney, PharmD, BCPS Clinical Pharmacist  01/10/2022 10:15 AM

## 2022-01-10 NOTE — Progress Notes (Signed)
  Progress Note   Patient: Rhonda Palmer UYQ:[id number] DOB: 24-Mar-1970 DOA: 01/01/2022     8 DOS: the patient was seen and examined on 01/10/2022   Brief hospital course: 51 y.o. female with medical history significant of hypertension, hyperlipidemia, hypothyroidism, asthma, depression, anxiety, schizoaffective disorder, fibromyalgia, diastolic heart failure, who presents to the ED with complaints of generalized weakness.   Assessment and Plan:  Hypotension: likely due to hypovolemia in setting of N/D, home seroquel likely contributed. Briefly on pressors 12/7, now discontinued. Hx of HTN. Holding lisinopril, propranolol & torsemide. Now Resolved   AKI: resolved. Educated pt on NSAIDs ADRs    Acute gastroenteritis: improved. Continue w/ supportive care   Intra-abdominal abscess: s/p exploratory laparotomy and intra-abd abscess cavity removal pod # 5, Etiology unclear. Abd cx NGTD. completed Abx. Dr Tonna Boehringer Drained non-infected, looking hematoma under staple line today as well in hope for better pain control   Hypokalemia: K 3.4, repleted.   Transaminitis: likely secondary to shock liver. Resolved    Hypothyroidism: continue on decreased dose of levothyroxine. Need repeat TSH in 4-6 weeks outpatient    DM2: HbA1c 5.8, well controlled. Continue on SSI w/ accuchecks    GAD: severity unknown. Continue on home dose of buspar        Subjective: c/o N/V and abd pain. Had to give zofran sooner than her scheduled time  Physical Exam: Vitals:   01/09/22 1932 01/10/22 0347 01/10/22 0744 01/10/22 1515  BP: 129/87 (!) 91/58 (!) 154/91 (!) 148/92  Pulse: 68 61 70 72  Resp: 20 16 18 17   Temp: 97.8 F (36.6 C) 97.6 F (36.4 C) 98 F (36.7 C) 97.9 F (36.6 C)  TempSrc: Oral Oral Oral Oral  SpO2: 97% 97% 100% 99%  Weight:      Height:       General exam: Appears comfortable  Respiratory system: clear breath sounds b/l  Cardiovascular system: S1/S2+. No rubs or clicks   Gastrointestinal system: Abd is soft, mild tenderness around staple line. No guarding/rigidity Central nervous system: Alert and oriented. Moves all extremities  Psychiatry: judgement and insight appears normal. Flat mood and affect  Data Reviewed:  K 3.4  Family Communication: None  Disposition: Status is: Inpatient Remains inpatient appropriate because: Ongoing Abd pain, N/V  Planned Discharge Destination: Home with Home Health   DVT prophylaxis - Lovenox Time spent: 35 minutes  Author: , MD 01/10/2022 4:58 PM  For on call review [email or web address].

## 2022-01-10 NOTE — Progress Notes (Signed)
Patient nauseated and vomiting. Zofran not due yet. Dr Sherryll Burger notified and gave order to give zofran now

## 2022-01-10 NOTE — Discharge Instructions (Signed)
Removal, Care After This sheet gives you information about how to care for yourself after your procedure. Your health care provider may also give you more specific instructions. If you have problems or questions, contact your health care provider. What can I expect after the procedure? After the procedure, it is common to have: Soreness. Bruising. Itching. Follow these instructions at home: site care Follow instructions from your health care provider about how to take care of your site. Make sure you: Wash your hands with soap and water before and after you change your bandage (dressing). If soap and water are not available, use hand sanitizer. Leave stitches (sutures), skin glue, or adhesive strips in place. These skin closures may need to stay in place for 2 weeks or longer. If adhesive strip edges start to loosen and curl up, you may trim the loose edges. Do not remove adhesive strips completely unless your health care provider tells you to do that. If the area bleeds or bruises, apply gentle pressure for 10 minutes. OK TO SHOWER IN 24HRS  Check your site every day for signs of infection. Check for: Redness, swelling, or pain. Fluid or blood. Warmth. Pus or a bad smell.  General instructions Rest and then return to your normal activities as told by your health care provider.  tylenol and advil as needed for discomfort.  Please alternate between the two every four hours as needed for pain.    Use narcotics, if prescribed, only when tylenol and motrin is not enough to control pain.  325-650mg every 8hrs to max of 3000mg/24hrs (including the 325mg in every norco dose) for the tylenol.    Advil up to 800mg per dose every 8hrs as needed for pain.   Keep all follow-up visits as told by your health care provider. This is important. Contact a health care provider if: You have redness, swelling, or pain around your site. You have fluid or blood coming from your site. Your site feels warm to  the touch. You have pus or a bad smell coming from your site. You have a fever. Your sutures, skin glue, or adhesive strips loosen or come off sooner than expected. Get help right away if: You have bleeding that does not stop with pressure or a dressing. Summary After the procedure, it is common to have some soreness, bruising, and itching at the site. Follow instructions from your health care provider about how to take care of your site. Check your site every day for signs of infection. Contact a health care provider if you have redness, swelling, or pain around your site, or your site feels warm to the touch. Keep all follow-up visits as told by your health care provider. This is important. This information is not intended to replace advice given to you by your health care provider. Make sure you discuss any questions you have with your health care provider. Document Released: 02/09/2015 Document Revised: 07/13/2017 Document Reviewed: 07/13/2017 Elsevier Interactive Patient Education  2019 [employer]   

## 2022-01-10 NOTE — Progress Notes (Signed)
Subjective:  CC: Rhonda Palmer is a 51 y.o. female  Hospital stay day 8, 5 Days Post-Op exploratory laparotomy, intra-abdominal abscess cavity removal  HPI: No acute complaints overnight. ROS:  General: Denies weight loss, weight gain, fatigue, fevers, chills, and night sweats. Heart: Denies chest pain, palpitations, racing heart, irregular heartbeat, leg pain or swelling, and decreased activity tolerance. Respiratory: Denies breathing difficulty, shortness of breath, wheezing, cough, and sputum. GI: Denies change in appetite, heartburn, nausea, vomiting, constipation, diarrhea, and blood in stool. GU: Denies difficulty urinating, pain with urinating, urgency, frequency, blood in urine.   Objective:   Temp:  [97.3 F (36.3 C)-98 F (36.7 C)] 98 F (36.7 C) (12/15 0744) Pulse Rate:  [61-70] 70 (12/15 0744) Resp:  [16-20] 18 (12/15 0744) BP: (91-154)/(58-91) 154/91 (12/15 0744) SpO2:  [97 %-100 %] 100 % (12/15 0744)     Height: 5' (152.4 cm) Weight: 89.8 kg BMI (Calculated): 38.67   Intake/Output this shift:   Intake/Output Summary (Last 24 hours) at 01/10/2022 1320 Last data filed at 01/09/2022 1900 Gross per 24 hour  Intake 330 ml  Output --  Net 330 ml    Constitutional :  alert, cooperative, appears stated age, and no distress  Respiratory:  clear to auscultation bilaterally  Cardiovascular:  regular rate and rhythm  Gastrointestinal: Soft, no guarding, tenderness to palpation around staple line.  Persistent  Skin: Cool and moist.   Psychiatric: Normal affect, non-agitated, not confused       LABS:     Latest Ref Rng & Units 01/10/2022    7:03 AM 01/09/2022    6:00 AM 01/07/2022    4:55 AM  CMP  Glucose 70 - 99 mg/dL 94  93  948   BUN 6 - 20 mg/dL 5  7  11    Creatinine 0.44 - 1.00 mg/dL 0.16  5.53  7.48   Sodium 135 - 145 mmol/L 142  143  139   Potassium 3.5 - 5.1 mmol/L 3.4  3.6  3.1   Chloride 98 - 111 mmol/L 117  117  107   CO2 22 - 32 mmol/L 23  23   27    Calcium 8.9 - 10.3 mg/dL 8.5  8.9  9.1   Total Protein 6.5 - 8.1 g/dL   5.9   Total Bilirubin 0.3 - 1.2 mg/dL   0.8   Alkaline Phos 38 - 126 U/L   125   AST 15 - 41 U/L   14   ALT 0 - 44 U/L   35       Latest Ref Rng & Units 01/10/2022    7:03 AM 01/09/2022    6:00 AM 01/08/2022    2:01 AM  CBC  WBC 4.0 - 10.5 K/uL 6.7  9.7  10.9   Hemoglobin 12.0 - 15.0 g/dL 01/10/2022  27.0  78.6   Hematocrit 36.0 - 46.0 % 34.2  37.5  33.3   Platelets 150 - 400 K/uL 277  245  227     PATHOLOGY SURGICAL PATHOLOGY CASE: [id number] PATIENT: 01-07-1974 Surgical Pathology Report     Specimen Submitted: A. Abscess, abdominal cavity  Clinical History: Bowel obstruction      DIAGNOSIS: A.  ABSCESS, ABDOMINAL CAVITY; EXCISION: - FIBROADIPOSE TISSUE WITH FAT NECROSIS, REACTIVE FIBROSIS, AND MIXED INFLAMMATION. - NEGATIVE FOR MALIGNANCY.  GROSS DESCRIPTION: A. Labeled: Abdominal abscess cavity Received: Fresh Collection time: 12:41 PM on 01/05/2022 Placed into formalin time: 1:29 PM on 01/05/2022 Tissue fragment(s): 2 Size: Aggregate, 7.5  x 5.5 x 4 cm Description: Received are yellow fibrofatty tissue fragments.  There is a 3.0 x 3.0 cm dull, hemorrhagic area on the surface of the larger fragment.  Sectioning shows yellow, glistening cut surfaces Representative sections are submitted in 2 cassettes.  CM 01/06/2022  Final Diagnosis performed by Elijah Birk, MD.   Electronically signed 01/07/2022 12:24:49PM The electronic signature indicates that the named Attending Pathologist has evaluated the specimen Technical component performed at [facility], [address], [place], [place] [address] Lab: [phone] Dir: Jolene Schimke, MD, MMM  Professional component performed at [facility], [facility], [address] [place], [place], [place] [address] Lab: [phone] Dir: Beryle Quant, MD   RADS: CLINICAL DATA:  Abdominal pain, postop day 4 after  exploratory laparotomy   EXAM: CT ABDOMEN AND PELVIS WITH CONTRAST   TECHNIQUE: Multidetector CT imaging of the abdomen and pelvis was performed using the standard protocol following bolus administration of intravenous contrast.   RADIATION DOSE REDUCTION: This exam was performed according to the departmental dose-optimization program which includes automated exposure control, adjustment of the mA and/or kV according to patient size and/or use of iterative reconstruction technique.   CONTRAST:  OMNIPAQUE IOHEXOL 300 MG/ML  SOLN   COMPARISON:  01/02/2022   FINDINGS: Lower chest: Trace bilateral pleural effusions. Patchy consolidates crash that minimal dependent bilateral lower lobe atelectasis.   Hepatobiliary: Cholecystectomy, with stable postsurgical dilatation of the common bile duct measuring up to 16 mm. No evidence of choledocholithiasis. The liver is unremarkable.   Pancreas: Unremarkable. No pancreatic ductal dilatation or surrounding inflammatory changes.   Spleen: Normal in size without focal abnormality.   Adrenals/Urinary Tract: Adrenal glands are unremarkable. Kidneys are normal, without renal calculi, focal lesion, or hydronephrosis. Bladder is unremarkable.   Stomach/Bowel: No bowel obstruction or ileus. Normal retrocecal appendix. No bowel wall thickening or inflammatory change. Postsurgical changes from bariatric surgery.   Vascular/Lymphatic: No significant vascular findings are present. No enlarged abdominal or pelvic lymph nodes.   Reproductive: Status post hysterectomy. No adnexal masses.   Other: No free fluid or free intraperitoneal gas. Postsurgical changes from midline laparotomy, with subcutaneous gas and fluid along the incision site, measuring up to 4.3 x 3.2 cm in transverse dimension, and extending approximately 10 cm in length. This is likely related to postoperative seroma or hematoma. There is no peripheral enhancement or  organization to suggest abscess.   Interval resection of the peritoneal fat necrosis seen on prior exam.   Musculoskeletal: No acute or destructive bony lesions. Reconstructed images demonstrate no additional findings.   IMPRESSION: 1. Interval midline laparotomy, with subcutaneous gas and fluid along the incision line likely reflecting residual seroma or postoperative hematoma. No rim enhancement to suggest abscess. 2. No acute intra-abdominal or intrapelvic process. 3. Trace bilateral pleural effusions with minimal dependent lower lobe atelectasis.     Electronically Signed   By: Sharlet Salina M.D.   On: 01/09/2022 18:29   Assessment:   Status post exploratory laparotomy, abdominal abscess cavity removal.    CT scan as above.  Remove staples at bedside and old hematoma contents evacuated.  No signs of purulent drainage or active infection grossly.  Area packed with Kerlix roll and secured with ABD and paper tape.  Open his symptoms will be better now that hematoma has been evacuated.  If pain persists, may need additional evacuation and further exploration underneath the already healing skin incision.   labs/images/medications/previous chart entries reviewed personally and relevant changes/updates noted above.

## 2022-01-11 DIAGNOSIS — K651 Peritoneal abscess: Secondary | ICD-10-CM | POA: Diagnosis not present

## 2022-01-11 LAB — CBC
HCT: 37.3 % (ref 36.0–46.0)
Hemoglobin: 11.2 g/dL — ABNORMAL LOW (ref 12.0–15.0)
MCH: 27.7 pg (ref 26.0–34.0)
MCHC: 30 g/dL (ref 30.0–36.0)
MCV: 92.1 fL (ref 80.0–100.0)
Platelets: 254 10*3/uL (ref 150–400)
RBC: 4.05 MIL/uL (ref 3.87–5.11)
RDW: 15.1 % (ref 11.5–15.5)
WBC: 7 10*3/uL (ref 4.0–10.5)
nRBC: 0 % (ref 0.0–0.2)

## 2022-01-11 LAB — BASIC METABOLIC PANEL
Anion gap: 3 — ABNORMAL LOW (ref 5–15)
BUN: <5 mg/dL — ABNORMAL LOW (ref 6–20)
CO2: 19 mmol/L — ABNORMAL LOW (ref 22–32)
Calcium: 8.4 mg/dL — ABNORMAL LOW (ref 8.9–10.3)
Chloride: 118 mmol/L — ABNORMAL HIGH (ref 98–111)
Creatinine, Ser: 0.41 mg/dL — ABNORMAL LOW (ref 0.44–1.00)
GFR, Estimated: >60 mL/min (ref >60)
Glucose, Bld: 97 mg/dL (ref 70–99)
Potassium: 4.1 mmol/L (ref 3.5–5.1)
Sodium: 140 mmol/L (ref 135–145)

## 2022-01-11 LAB — GLUCOSE, CAPILLARY
Glucose-Capillary: 78 mg/dL (ref 70–99)
Glucose-Capillary: 109 mg/dL — ABNORMAL HIGH (ref 70–99)
Glucose-Capillary: 96 mg/dL (ref 70–99)
Glucose-Capillary: 90 mg/dL (ref 70–99)
Glucose-Capillary: 105 mg/dL — ABNORMAL HIGH (ref 70–99)
Glucose-Capillary: 134 mg/dL — ABNORMAL HIGH (ref 70–99)

## 2022-01-11 MED ORDER — MORPHINE SULFATE (PF) 2 MG/ML IV SOLN
1.0000 mg | INTRAVENOUS | Status: DC | PRN
  Administered 2022-01-11 – 2022-01-13 (×11): 1 mg via INTRAVENOUS
  Filled 2022-01-11 (×11): qty 1

## 2022-01-11 NOTE — Progress Notes (Signed)
Mobility Specialist - Progress Note    01/11/22 1103  Mobility  Activity Ambulated with assistance in hallway  Level of Assistance Independent  Assistive Device None  Distance Ambulated (ft) 160 ft  Activity Response Tolerated well  Mobility Referral Yes  $Mobility charge 1 Mobility   Pt resting in bed on RA upon entry. Pt STS and ambulates around NS in hallway. Pt returned to bed and left with needs in reach.    Johnathan Hausen Mobility Specialist 01/11/22, 11:06 AM

## 2022-01-11 NOTE — Progress Notes (Signed)
Progress Note   Patient: Rhonda Palmer CVE:[id number] DOB: 07/07/1970 DOA: 01/01/2022     9 DOS: the patient was seen and examined on 01/11/2022   Brief hospital course: 51 y.o. 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    - On 12/7, patient was briefly on pressors. - On 12/10, general surgery Dr. Tonna Boehringer performed an exploratory laparotomy and excision of an intra-abdominal wall abscess. - On 12/14, patient developed a small hematoma. - On 12/15, hematoma was evacuated at bedside.  Assessment and Plan:  Abdominal Abscess s/p 12/10 I&D complicated by hematoma s/p 12/15 evacuation: - This is Day 7 of antibiotics Cipro and Flagyl. - Monitor wounds.  Nausea and Vomiting: Patient remains nauseous with multiple pain complaints that worsen her nausea.  She is having regular bowel movements. - Continue Zofran PRN. - Continue pain medications PRN. - Once patient is able to tolerate a soft diet without persistent nausea, we will organize discharge home.  Schizoaffective Disorder: Mood is stable. - Continue psychiatric medications.  Hypotension, resolved AKI, resolved Elevated LFTs, resolved     Subjective: Patient continues to complain of pain, nausea, and vomiting. She is able to tolerate a liquid diet - and we are advancing to soft solids as tolerated.  Patient remains a little bit anxious.  Abdomen is benign.  She is having regular bowel movements.  There is small amount of hematoma fluid.  Wounds are cdi.  Physical Exam: Vitals:   01/10/22 2010 01/11/22 0402 01/11/22 0837 01/11/22 1601  BP: (!) 141/77 134/86 (!) 153/92 (!) 146/85  Pulse: 74 69 72 76  Resp: 16 16 14 16   Temp: (!) 97.5 F (36.4 C) 97.7 F (36.5 C) 98.7 F (37.1 C) 97.9 F (36.6 C)  TempSrc:  Oral Oral    SpO2: 96% 99% 98% 98%  Weight:      Height:       Examination: General exam: chronically ill appearing, pale, anxious HEENT: NCAT, PERRL Respiratory system: CTAB no WRR Cardiovascular system: Did not appreciate a murmur, regular, No JVD. Gastrointestinal system: abdomen is soft and non-distended.  There is mild tenderness on palpation at the mid-abdomen.  There is a little bit of hematoma fluid.  Wounds are cdi. Nervous System: No focal deficits. Extremities: No edema, distal peripheral pulses palpable.  Skin: no infectious at wound site. MSK: Physical Deconditioning  Data Reviewed:  Last metabolic panel Lab Results  Component Value Date   GLUCOSE 97 01/11/2022   NA 140 01/11/2022   K 4.1 01/11/2022   CL 118 (H) 01/11/2022   CO2 19 (L) 01/11/2022   BUN <5 (L) 01/11/2022   CREATININE 0.41 (L) 01/11/2022   GFRNONAA >60 01/11/2022   CALCIUM 8.4 (L) 01/11/2022   PHOS 3.2 01/13/2021   PROT 5.9 (L) 01/07/2022   ALBUMIN 3.1 (L) 01/07/2022   BILITOT 0.8 01/07/2022   ALKPHOS 125 01/07/2022   AST 14 (L) 01/07/2022   ALT 35 01/07/2022   ANIONGAP 3 (L) 01/11/2022    Family Communication: None  Disposition: Status is: Inpatient Remains inpatient appropriate because: Need for pain management, Recurrent Nausea and Vomiting  Planned Discharge Destination: Home with Home Health 1-2 days   DVT prophylaxis - Lovenox Time spent: 35 minutes  Author: 01/13/2022, MD 01/11/2022 5:32 PM  For on call review [email or web address].

## 2022-01-11 NOTE — Plan of Care (Signed)

## 2022-01-11 NOTE — Progress Notes (Signed)
01/11/2022  Subjective: No acute events overnight.  Patient reports some nausea and abdominal pain at the incision.  She is having bowel function her bowel movement today.  Hemoglobin is stable with mildly improved hemoglobin 11.2 compared to 10.5 yesterday.  Vital signs: Temp:  [97.5 F (36.4 C)-98.7 F (37.1 C)] 98.7 F (37.1 C) (12/16 0837) Pulse Rate:  [69-74] 72 (12/16 0837) Resp:  [14-17] 14 (12/16 0837) BP: (134-153)/(77-92) 153/92 (12/16 0837) SpO2:  [96 %-99 %] 98 % (12/16 0837)   Intake/Output: No intake/output data recorded. Last BM Date : 01/11/22  Physical Exam: Constitutional: No acute distress Abdomen: Soft, nondistended, appropriately tender to palpation.  Midline incision has 2 cm opening at its midportion.  The top half of the staples are of the bottom half of the staples are remaining in place.  There is still old hematoma fluid that was being able to be evacuated with gentle pressure.  The wound was then dressed with half-inch iodoform gauze packing and dry gauze cover.  Labs:  Recent Labs    01/10/22 0703 01/11/22 0501  WBC 6.7 7.0  HGB 10.5* 11.2*  HCT 34.2* 37.3  PLT 277 254   Recent Labs    01/10/22 0703 01/11/22 0501  NA 142 140  K 3.4* 4.1  CL 117* 118*  CO2 23 19*  GLUCOSE 94 97  BUN <5* <5*  CREATININE 0.36* 0.41*  CALCIUM 8.5* 8.4*   No results for input(s): "LABPROT", "INR" in the last 72 hours.  Imaging: No results found.  Assessment/Plan: This is a 51 y.o. female status post exploratory laparotomy with excision of intra-abdominal abscess.  - Patient had a hematoma develop postoperatively which is now stable with only old blood that can be manually expressed from the wound.  The wound was packed with half-inch iodoform gauze and dressed with dry gauze dressing. - The patient is not comfortable yet going home due to some nausea and pain.  Things okay to keep her here still and continue reassessing this.  She is on a diabetic diet  and is fine to continue that for now. - Will continue to follow.   I spent 35 minutes dedicated to the care of this patient on the date of this encounter to include pre-visit review of records, face-to-face time with the patient discussing diagnosis and management, and any post-visit coordination of care.  Howie Ill, MD [facility]

## 2022-01-12 DIAGNOSIS — K651 Peritoneal abscess: Secondary | ICD-10-CM | POA: Diagnosis not present

## 2022-01-12 LAB — GLUCOSE, CAPILLARY
Glucose-Capillary: 104 mg/dL — ABNORMAL HIGH (ref 70–99)
Glucose-Capillary: 106 mg/dL — ABNORMAL HIGH (ref 70–99)
Glucose-Capillary: 85 mg/dL (ref 70–99)
Glucose-Capillary: 91 mg/dL (ref 70–99)
Glucose-Capillary: 92 mg/dL (ref 70–99)
Glucose-Capillary: 96 mg/dL (ref 70–99)

## 2022-01-12 LAB — BASIC METABOLIC PANEL
Anion gap: 4 — ABNORMAL LOW (ref 5–15)
BUN: <5 mg/dL — ABNORMAL LOW (ref 6–20)
CO2: 26 mmol/L (ref 22–32)
Calcium: 8.7 mg/dL — ABNORMAL LOW (ref 8.9–10.3)
Chloride: 113 mmol/L — ABNORMAL HIGH (ref 98–111)
Creatinine, Ser: 0.42 mg/dL — ABNORMAL LOW (ref 0.44–1.00)
GFR, Estimated: >60 mL/min (ref >60)
Glucose, Bld: 99 mg/dL (ref 70–99)
Potassium: 3.9 mmol/L (ref 3.5–5.1)
Sodium: 143 mmol/L (ref 135–145)

## 2022-01-12 LAB — CBC
HCT: 34.3 % — ABNORMAL LOW (ref 36.0–46.0)
Hemoglobin: 10.6 g/dL — ABNORMAL LOW (ref 12.0–15.0)
MCH: 27.9 pg (ref 26.0–34.0)
MCHC: 30.9 g/dL (ref 30.0–36.0)
MCV: 90.3 fL (ref 80.0–100.0)
Platelets: 325 10*3/uL (ref 150–400)
RBC: 3.8 MIL/uL — ABNORMAL LOW (ref 3.87–5.11)
RDW: 15.3 % (ref 11.5–15.5)
WBC: 7.8 10*3/uL (ref 4.0–10.5)
nRBC: 0 % (ref 0.0–0.2)

## 2022-01-12 NOTE — Progress Notes (Signed)
Progress Note   Patient: Rhonda Palmer UDJ:[id number] DOB: 17-Jan-1971 DOA: 01/01/2022     10 DOS: the patient was seen and examined on 01/12/2022   Brief hospital course: 51 y.o. 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    - On 12/7, patient was briefly on pressors. - On 12/10, general surgery Dr. Tonna Boehringer performed an exploratory laparotomy and excision of an intra-abdominal wall abscess. - On 12/14, patient developed a small hematoma. - On 12/15, hematoma was evacuated at bedside. - On 12/16-12/17, patient continues to complain of nausea.  Assessment and Plan:  Abdominal Abscess s/p 12/10 I&D complicated by hematoma s/p 12/15 evacuation: - S/P 7 days of antibiotics Cipro and Flagyl. - Wounds are stable.    Nausea and Vomiting: Patient remains nauseous with multiple pain complaints that worsen her nausea.  She is having regular bowel movements. - Continue Zofran PRN. - Continue pain medications PRN. - Patient is stable for discharge but remains nauseous and claims to be unable to swallow solid foods.  We will give her one more day of anti-emetics and aim for discharge home tomorrow.  Schizoaffective Disorder: Mood is stable. - Continue psychiatric medications.  Hypotension, resolved AKI, resolved Elevated LFTs, resolved     Subjective: Patient continues to complain of pain, nausea, and vomiting. She is able to tolerate a liquid diet, but states that she feels too nauseous to swallow solid foods.  Patient remains  anxious.  Abdomen is benign.  She is having regular bowel movements.  Wounds are stable.   We discussed her plan of care.  We will give her another day of anti-emetics and pain medications and aim for discharge tomorrow as she is otherwise stable for  discharge.  Physical Exam: Vitals:   01/11/22 1601 01/11/22 1949 01/12/22 0327 01/12/22 0811  BP: (!) 146/85 129/73 (!) 138/90 (!) 146/93  Pulse: 76 72 65 66  Resp: 16 20 18 16   Temp: 97.9 F (36.6 C) 98.1 F (36.7 C) 97.8 F (36.6 C) 97.9 F (36.6 C)  TempSrc:  Oral Oral   SpO2: 98% 96% 100% 100%  Weight:      Height:       Examination: General exam: chronically ill appearing, pale, anxious HEENT: NCAT, PERRL Respiratory system: CTAB no WRR Cardiovascular system: Did not appreciate a murmur, regular, No JVD. Gastrointestinal system: abdomen is soft and non-distended.  There is mild tenderness on palpation at the mid-abdomen.  There is a little bit of hematoma fluid.  Wounds are cdi. Nervous System: No focal deficits. Extremities: No edema, distal peripheral pulses palpable.  Skin: no infectious at wound site. MSK: Physical Deconditioning  Data Reviewed:  Last metabolic panel Lab Results  Component Value Date   GLUCOSE 99 01/12/2022   NA 143 01/12/2022   K 3.9 01/12/2022   CL 113 (H) 01/12/2022   CO2 26 01/12/2022   BUN <5 (L) 01/12/2022   CREATININE 0.42 (L) 01/12/2022   GFRNONAA >60 01/12/2022   CALCIUM 8.7 (L) 01/12/2022   PHOS 3.2 01/13/2021   PROT 5.9 (L) 01/07/2022   ALBUMIN 3.1 (L) 01/07/2022   BILITOT 0.8 01/07/2022   ALKPHOS 125 01/07/2022   AST 14 (L) 01/07/2022   ALT 35 01/07/2022   ANIONGAP 4 (L) 01/12/2022    Family Communication: None  Disposition: Status  is: Inpatient Remains inpatient appropriate because: Need for pain management, Recurrent Nausea and Vomiting  Planned Discharge Destination: Home with Home Health in 24 hours.   DVT prophylaxis - Lovenox Time spent: 35 minutes  Author: Baldwin [place], MD 01/12/2022 3:00 PM  For on call review [email or web address].

## 2022-01-12 NOTE — Progress Notes (Signed)
01/12/2022  Subjective: Patient reports that she had at least 1 episode of emesis yesterday.  She still remains with nausea and abdominal pain.  Her hemoglobin is overall stable.  Vital signs: Temp:  [97.8 F (36.6 C)-98.1 F (36.7 C)] 97.9 F (36.6 C) (12/17 0811) Pulse Rate:  [65-76] 66 (12/17 0811) Resp:  [16-20] 16 (12/17 0811) BP: (129-146)/(73-93) 146/93 (12/17 0811) SpO2:  [96 %-100 %] 100 % (12/17 0811)   Intake/Output: 12/16 0701 - 12/17 0700 In: 240 [P.O.:240] Out: 0  Last BM Date : 01/11/22  Physical Exam: Constitutional: No acute distress Abdomen: Soft, nondistended, appropriately tender to palpation.  Patient's incisions are healing well.  There is a stable 2 cm opening at the midportion of the incision.  Upon manual pressure on the wound, there is no further hematoma fluid that can be expressed.  The wound was then redressed with new half-inch gauze packing, followed by 4 x 4 gauze and tape.  Labs:  Recent Labs    01/11/22 0501 01/12/22 0412  WBC 7.0 7.8  HGB 11.2* 10.6*  HCT 37.3 34.3*  PLT 254 325   Recent Labs    01/11/22 0501 01/12/22 0412  NA 140 143  K 4.1 3.9  CL 118* 113*  CO2 19* 26  GLUCOSE 97 99  BUN <5* <5*  CREATININE 0.41* 0.42*  CALCIUM 8.4* 8.7*   No results for input(s): "LABPROT", "INR" in the last 72 hours.  Imaging: No results found.  Assessment/Plan: This is a 51 y.o. female exploratory laparotomy with excision of intra-abdominal abscess.  - The patient's hematoma is improving with no further hematoma fluid being able to get expressed.  Wound is otherwise appearing healthy without any evidence of infection.  Repacked today with half-inch iodoform gauze. - The patient remains nauseous and has had episodes of emesis.  Outside of this, the patient would otherwise be stable for discharge.  Continue working with nausea control and she can have diet as tolerated.   I spent 35 minutes dedicated to the care of this patient on the  date of this encounter to include pre-visit review of records, face-to-face time with the patient discussing diagnosis and management, and any post-visit coordination of care.  Howie Ill, MD [facility]

## 2022-01-12 NOTE — Progress Notes (Signed)
Mobility Specialist - Progress Note    01/12/22 1212  Mobility  Activity Ambulated independently in hallway;Stood at bedside  Level of Assistance Independent  Assistive Device Other (Comment) (HHA (Author))  Distance Ambulated (ft) 320 ft  Activity Response Tolerated well  Mobility Referral Yes  $Mobility charge 1 Mobility   Pt resting in bed on RA upon entry. Pt expressed feeling nausea and abdom. Pain but, still motivated to partcipate in mobility. Pt STS and ambulates indep. In hallway around NS with HHA for two laps. Pt return to bed and left with needs in reach.   Johnathan Hausen Mobility Specialist 01/12/22, 12:15 PM

## 2022-01-12 NOTE — Plan of Care (Signed)
  Problem: Education: Goal: Ability to describe self-care measures that may prevent or decrease complications (Diabetes Survival Skills Education) will improve Outcome: Progressing Goal: Individualized Educational Video(s) Outcome: Progressing   Problem: Coping: Goal: Ability to adjust to condition or change in health will improve Outcome: Progressing   Problem: Fluid Volume: Goal: Ability to maintain a balanced intake and output will improve Outcome: Progressing   Problem: Health Behavior/Discharge Planning: Goal: Ability to identify and utilize available resources and services will improve Outcome: Progressing Goal: Ability to manage health-related needs will improve Outcome: Progressing   Problem: Metabolic: Goal: Ability to maintain appropriate glucose levels will improve Outcome: Progressing   Problem: Nutritional: Goal: Maintenance of adequate nutrition will improve Outcome: Progressing Goal: Progress toward achieving an optimal weight will improve Outcome: Progressing   Problem: Skin Integrity: Goal: Risk for impaired skin integrity will decrease Outcome: Progressing   Problem: Tissue Perfusion: Goal: Adequacy of tissue perfusion will improve Outcome: Progressing   Problem: Education: Goal: Knowledge of General Education information will improve Description: Including pain rating scale, medication(s)/side effects and non-pharmacologic comfort measures Outcome: Progressing   Problem: Health Behavior/Discharge Planning: Goal: Ability to manage health-related needs will improve Outcome: Progressing   Problem: Clinical Measurements: Goal: Ability to maintain clinical measurements within normal limits will improve Outcome: Progressing Goal: Will remain free from infection Outcome: Progressing Goal: Diagnostic test results will improve Outcome: Progressing Goal: Respiratory complications will improve Outcome: Progressing Goal: Cardiovascular complication will  be avoided Outcome: Progressing   Problem: Activity: Goal: Risk for activity intolerance will decrease Outcome: Progressing   Problem: Nutrition: Goal: Adequate nutrition will be maintained Outcome: Progressing   Problem: Coping: Goal: Level of anxiety will decrease Outcome: Progressing   Problem: Elimination: Goal: Will not experience complications related to bowel motility Outcome: Progressing Goal: Will not experience complications related to urinary retention Outcome: Progressing   Problem: Skin Integrity: Goal: Risk for impaired skin integrity will decrease Outcome: Progressing   Problem: Pain Managment: Goal: General experience of comfort will improve Outcome: Progressing

## 2022-01-12 NOTE — Progress Notes (Signed)
Mobility Specialist - Progress Note    01/12/22 1216  Mobility  Activity Ambulated independently to bathroom;Stood at bedside  Level of Assistance Independent  Assistive Device None  Distance Ambulated (ft) 10 ft  Activity Response Tolerated well  Mobility Referral Yes  $Mobility charge 1 Mobility   Pt resting in bed on RA upon entry. Pt STS and ambulates indep. To bathroom. Pt return to sink and performed hygiene with assistance from author. Pt returned to bed and left with needs in reach.    Johnathan Hausen Mobility Specialist 01/12/22, 12:18 PM

## 2022-01-13 DIAGNOSIS — K529 Noninfective gastroenteritis and colitis, unspecified: Secondary | ICD-10-CM | POA: Diagnosis not present

## 2022-01-13 DIAGNOSIS — E039 Hypothyroidism, unspecified: Secondary | ICD-10-CM | POA: Diagnosis not present

## 2022-01-13 DIAGNOSIS — N179 Acute kidney failure, unspecified: Secondary | ICD-10-CM | POA: Diagnosis not present

## 2022-01-13 DIAGNOSIS — K651 Peritoneal abscess: Secondary | ICD-10-CM | POA: Diagnosis not present

## 2022-01-13 LAB — CBC
HCT: 33.4 % — ABNORMAL LOW (ref 36.0–46.0)
Hemoglobin: 10.5 g/dL — ABNORMAL LOW (ref 12.0–15.0)
MCH: 28.2 pg (ref 26.0–34.0)
MCHC: 31.4 g/dL (ref 30.0–36.0)
MCV: 89.8 fL (ref 80.0–100.0)
Platelets: 335 10*3/uL (ref 150–400)
RBC: 3.72 MIL/uL — ABNORMAL LOW (ref 3.87–5.11)
RDW: 15.3 % (ref 11.5–15.5)
WBC: 8.5 10*3/uL (ref 4.0–10.5)
nRBC: 0 % (ref 0.0–0.2)

## 2022-01-13 LAB — GLUCOSE, CAPILLARY
Glucose-Capillary: 103 mg/dL — ABNORMAL HIGH (ref 70–99)
Glucose-Capillary: 83 mg/dL (ref 70–99)
Glucose-Capillary: 107 mg/dL — ABNORMAL HIGH (ref 70–99)

## 2022-01-13 LAB — BASIC METABOLIC PANEL
Anion gap: 3 — ABNORMAL LOW (ref 5–15)
BUN: <5 mg/dL — ABNORMAL LOW (ref 6–20)
CO2: 28 mmol/L (ref 22–32)
Calcium: 8.8 mg/dL — ABNORMAL LOW (ref 8.9–10.3)
Chloride: 110 mmol/L (ref 98–111)
Creatinine, Ser: 0.39 mg/dL — ABNORMAL LOW (ref 0.44–1.00)
GFR, Estimated: >60 mL/min (ref >60)
Glucose, Bld: 97 mg/dL (ref 70–99)
Potassium: 4.2 mmol/L (ref 3.5–5.1)
Sodium: 141 mmol/L (ref 135–145)

## 2022-01-13 MED ORDER — OXYCODONE HCL 5 MG PO TABS: 5.0000 mg | ORAL_TABLET | Freq: Four times a day (QID) | ORAL | 0 refills | Status: AC | PRN

## 2022-01-13 MED ORDER — TORSEMIDE 20 MG PO TABS: 20.0000 mg | ORAL_TABLET | Freq: Every day | ORAL | Status: AC

## 2022-01-13 MED ORDER — LISINOPRIL 20 MG PO TABS: 20.0000 mg | ORAL_TABLET | Freq: Every day | ORAL | Status: DC

## 2022-01-13 MED ORDER — LEVOTHYROXINE SODIUM 125 MCG PO TABS: 250.0000 ug | ORAL_TABLET | Freq: Every day | ORAL | 0 refills | Status: DC

## 2022-01-13 NOTE — TOC Transition Note (Signed)
Transition of Care [facility]) - CM/SW Discharge Note   Patient Details  Name: Rhonda Palmer MRN: [id number] Date of Birth: 09-17-1970  Transition of Care [facility]) CM/SW Contact:  Chapman Fitch, RN Phone Number: 01/13/2022, 9:52 AM   Clinical Narrative:     Patient to discharge today No TOC needs identified at discharge Please consult if needs arise     Barriers to Discharge: Continued Medical Work up   Patient Goals and CMS Choice        Discharge Placement                       Discharge Plan and Services                                     Social Determinants of Health (SDOH) Interventions     Readmission Risk Interventions    01/13/2022    9:50 AM 01/07/2022    2:24 PM 01/15/2021    3:47 PM  Readmission Risk Prevention Plan  Transportation Screening Complete  Complete  PCP or Specialist Appt within 3-5 Days   Complete  HRI or Home Care Consult Complete    Social Work Consult for Recovery Care Planning/Counseling Complete Complete Complete  Palliative Care Screening Not Applicable Not Applicable Not Applicable  Medication Review Oceanographer) Complete Complete Complete

## 2022-01-13 NOTE — Plan of Care (Signed)
IV removed, discharge instructions reviewed, dressing change instructions and demonstration given by Dr. Katharine Look with patients husband present.  Supplies sent home with family.  Patient discharged to home

## 2022-01-13 NOTE — Progress Notes (Signed)
Mobility Specialist - Progress Note   01/13/22 1005  Mobility  Activity Ambulated independently in room;Ambulated independently to bathroom  Level of Assistance Independent  Assistive Device None  Distance Ambulated (ft) 10 ft  Activity Response Tolerated well  Mobility Referral Yes  $Mobility charge 1 Mobility   Pt ambulated indep around the room and to the bathroom without AD, tolerated well. Pt returned to bed, left supine with needs within reach.   Zetta Bills Mobility Specialist 01/13/22 10:06 AM

## 2022-01-13 NOTE — Plan of Care (Signed)
  Problem: Education: Goal: Ability to describe self-care measures that may prevent or decrease complications (Diabetes Survival Skills Education) will improve Outcome: Progressing Goal: Individualized Educational Video(s) Outcome: Progressing   Problem: Coping: Goal: Ability to adjust to condition or change in health will improve Outcome: Progressing   Problem: Fluid Volume: Goal: Ability to maintain a balanced intake and output will improve Outcome: Progressing   Problem: Health Behavior/Discharge Planning: Goal: Ability to identify and utilize available resources and services will improve Outcome: Progressing Goal: Ability to manage health-related needs will improve Outcome: Progressing   Problem: Metabolic: Goal: Ability to maintain appropriate glucose levels will improve Outcome: Progressing   Problem: Nutritional: Goal: Maintenance of adequate nutrition will improve Outcome: Progressing Goal: Progress toward achieving an optimal weight will improve Outcome: Progressing   Problem: Skin Integrity: Goal: Risk for impaired skin integrity will decrease Outcome: Progressing   Problem: Tissue Perfusion: Goal: Adequacy of tissue perfusion will improve Outcome: Progressing   Problem: Education: Goal: Knowledge of General Education information will improve Description: Including pain rating scale, medication(s)/side effects and non-pharmacologic comfort measures Outcome: Progressing   Problem: Health Behavior/Discharge Planning: Goal: Ability to manage health-related needs will improve Outcome: Progressing   Problem: Clinical Measurements: Goal: Ability to maintain clinical measurements within normal limits will improve Outcome: Progressing Goal: Will remain free from infection Outcome: Progressing Goal: Diagnostic test results will improve Outcome: Progressing Goal: Respiratory complications will improve Outcome: Progressing Goal: Cardiovascular complication will  be avoided Outcome: Progressing   Problem: Activity: Goal: Risk for activity intolerance will decrease Outcome: Progressing   Problem: Nutrition: Goal: Adequate nutrition will be maintained Outcome: Progressing   Problem: Coping: Goal: Level of anxiety will decrease Outcome: Progressing   Problem: Elimination: Goal: Will not experience complications related to bowel motility Outcome: Progressing Goal: Will not experience complications related to urinary retention Outcome: Progressing   Problem: Skin Integrity: Goal: Risk for impaired skin integrity will decrease Outcome: Progressing

## 2022-01-13 NOTE — Discharge Summary (Signed)
Physician Discharge Summary  Rhonda Palmer Lise Schlink JXB:[id number] DOB: 07-09-1970 DOA: 01/01/2022  PCP: Franciso Bendogers, Jennifer B, NP  Admit date: 01/01/2022 Discharge date: 01/13/2022 Admitted From: Home Disposition: Home Recommendations for Outpatient Follow-up:  Follow up with PCP in 1 to 2 weeks Check CMP and CBC in 1 week Please follow up on the following pending results: None  Home Health: None Equipment/Devices: None  Discharge Condition: Stable CODE STATUS: Full code  Follow-up Information     Tonna BoehringerSakai, Isami, DO. Go in 1 week(s).   Specialty: Surgery Why: 01/21/22 9:30AM  for staple removal Contact information: [address] [place] [address] [phone]         Franciso Bendogers, Jennifer B, NP. Go in 1 week(s).   Specialty: Nurse Practitioner Why: 01/21/22 2PM Contact information: [address] [place] [address] [phone]                 Hospital course 51 y.o. 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  She underwent ex lap and excision of intra-abdominal wall abscess on 12/10 complicated by hematoma for which she underwent evacuation on 01/10/2022.  She completed 7 days of antibiotic course with Cipro and Flagyl.  GI symptoms improved.  She tolerated regular diet.  She was cleared for discharge by general surgery for outpatient follow-up.  See individual problem list below for more.   Problems addressed during this hospitalization Principal Problem:   Abscess of abdominal cavity (HCC) Active Problems:   Long term (current) use of opiate analgesic   Acute kidney injury (HCC)   Acute gastroenteritis   Hypokalemia   Hypothyroidism   Hypertension   Diabetes mellitus without complication (HCC)    Schizoaffective disorder, bipolar type (HCC)   Obesity   Chronic pain syndrome   Asthma   Bipolar 1 disorder (HCC)   Acute kidney failure (HCC)   Nausea and vomiting              Vital signs Vitals:   01/12/22 1635 01/12/22 1955 01/13/22 0336 01/13/22 0816  BP: 133/80 122/77 115/73 (!) 141/89  Pulse: 78 67 64 72  Temp: 98.3 F (36.8 C) 98.4 F (36.9 C) 98.5 F (36.9 C) 98.1 F (36.7 C)  Resp: 12 17 17 18   Height:      Weight:      SpO2: 99% 96% 100% 98%  TempSrc: Oral  Oral Oral  BMI (Calculated):         Discharge exam  GENERAL: No apparent distress.  Nontoxic. HEENT: MMM.  Vision and hearing grossly intact.  NECK: Supple.  No apparent JVD.  RESP:  No IWOB.  Fair aeration bilaterally. CVS:  RRR. Heart sounds normal.  ABD/GI/GU: BS+. Abd soft.  Some discomfort with palpation mostly anxiety related. MSK/EXT:  Moves extremities. No apparent deformity. No edema.  SKIN: no apparent skin lesion or wound NEURO: Awake and alert. Oriented appropriately.  No apparent focal neuro deficit. PSYCH: Appears anxious.  Discharge Instructions Discharge Instructions     Call MD for:  extreme fatigue   Complete by: As directed    Call MD for:  persistant nausea and vomiting   Complete by: As directed    Call MD for:  redness, tenderness, or signs of infection (pain, swelling, redness, odor or green/yellow discharge  around incision site)   Complete by: As directed    Call MD for:  severe uncontrolled pain   Complete by: As directed    Call MD for:  temperature >100.4   Complete by: As directed    Diet - low sodium heart healthy   Complete by: As directed    Diet Carb Modified   Complete by: As directed    Discharge instructions   Complete by: As directed    It has been a pleasure taking care of you!  You were hospitalized due to abdominal abscess for which we have been treated with surgery.  Follow instruction by the surgeon for wound care and outpatient follow-up.   Please review your new medication list and the directions on your medications before you take them.  You may follow-up with your primary care doctor 1 to 2 weeks or sooner if needed.  Be aware that Oxycodone is addictive and can cause problems including drowsiness, sedation, constipation, impaired judgment, respiratory depression that could potentially lead to death and impaired balance that could increase risk of fall.   We do not recommend driving, operating machinery or other activity that requires similar mental and physical engagement.     Take care,   Discharge wound care:   Complete by: As directed    Per instruction by general surgery.   Increase activity slowly   Complete by: As directed       Allergies as of 01/13/2022       Reactions   Codeine Hives, Itching, Rash, Swelling   Sulfa Antibiotics Anaphylaxis   Penicillins Itching   TOLERATED CEFAZOLIN        Medication List     STOP taking these medications    GoodSense ClearLax 17 GM/SCOOP powder Generic drug: polyethylene glycol powder   IBU 800 MG tablet Generic drug: ibuprofen       TAKE these medications    albuterol 108 (90 Base) MCG/ACT inhaler Commonly known as: VENTOLIN HFA Inhale 2 puffs into the lungs every 6 (six) hours as needed for wheezing or shortness of breath.   busPIRone 15 MG tablet Commonly known as: BUSPAR Take 15 mg by mouth 3 (three) times daily as needed.   gabapentin 300 MG capsule Commonly known as: NEURONTIN Take 300 mg by mouth 3 (three) times daily.   levothyroxine 125 MCG tablet Commonly known as: SYNTHROID Take 2 tablets (250 mcg total) by mouth daily at 6 (six) AM. Start taking on: January 14, 2022 What changed:  medication strength how much to take Another medication with the same name was removed. Continue taking this medication, and follow the directions you see here.   lisinopril 20 MG tablet Commonly known as: ZESTRIL Take 1 tablet (20 mg total) by mouth  daily. Start taking on: January 16, 2022 What changed: These instructions start on January 16, 2022. If you are unsure what to do until then, ask your doctor or other care provider.   loperamide 2 MG tablet Commonly known as: Imodium A-D Take 1 tablet (2 mg total) by mouth 4 (four) times daily as needed for diarrhea or loose stools.   ondansetron 4 MG disintegrating tablet Commonly known as: ZOFRAN-ODT Take 4 mg by mouth 3 (three) times daily as needed.   oxyCODONE 5 MG immediate release tablet Commonly known as: Roxicodone Take 1 tablet (5 mg total) by mouth every 6 (six) hours as needed for up to 3 days for severe pain.   Ozempic (1 MG/DOSE) 4 MG/3ML  Sopn Generic drug: Semaglutide (1 MG/DOSE) Inject 1 mg into the skin once a week.   propranolol 20 MG tablet Commonly known as: INDERAL Take 20 mg by mouth 2 (two) times daily.   QUEtiapine 200 MG tablet Commonly known as: SEROQUEL Take 400 mg by mouth at bedtime.   torsemide 20 MG tablet Commonly known as: DEMADEX Take 1 tablet (20 mg total) by mouth daily. Start taking on: January 20, 2022 What changed: These instructions start on January 20, 2022. If you are unsure what to do until then, ask your doctor or other care provider.   traMADol 50 MG tablet Commonly known as: ULTRAM Take 1 tablet by mouth every 6 (six) hours as needed.               Discharge Care Instructions  (From admission, onward)           Start     Ordered   01/13/22 0000  Discharge wound care:       Comments: Per instruction by general surgery.   01/13/22 0810            Consultations: General surgery  Procedures/Studies: Expiratory laparotomy and intra-abdominal wall abscess drainage on 12/10. Hematoma evacuation   CT ABDOMEN PELVIS W CONTRAST  Result Date: 01/09/2022 CLINICAL DATA:  Abdominal pain, postop day 4 after exploratory laparotomy EXAM: CT ABDOMEN AND PELVIS WITH CONTRAST TECHNIQUE: Multidetector CT imaging  of the abdomen and pelvis was performed using the standard protocol following bolus administration of intravenous contrast. RADIATION DOSE REDUCTION: This exam was performed according to the departmental dose-optimization program which includes automated exposure control, adjustment of the mA and/or kV according to patient size and/or use of iterative reconstruction technique. CONTRAST:  OMNIPAQUE IOHEXOL 300 MG/ML  SOLN COMPARISON:  01/02/2022 FINDINGS: Lower chest: Trace bilateral pleural effusions. Patchy consolidates crash that minimal dependent bilateral lower lobe atelectasis. Hepatobiliary: Cholecystectomy, with stable postsurgical dilatation of the common bile duct measuring up to 16 mm. No evidence of choledocholithiasis. The liver is unremarkable. Pancreas: Unremarkable. No pancreatic ductal dilatation or surrounding inflammatory changes. Spleen: Normal in size without focal abnormality. Adrenals/Urinary Tract: Adrenal glands are unremarkable. Kidneys are normal, without renal calculi, focal lesion, or hydronephrosis. Bladder is unremarkable. Stomach/Bowel: No bowel obstruction or ileus. Normal retrocecal appendix. No bowel wall thickening or inflammatory change. Postsurgical changes from bariatric surgery. Vascular/Lymphatic: No significant vascular findings are present. No enlarged abdominal or pelvic lymph nodes. Reproductive: Status post hysterectomy. No adnexal masses. Other: No free fluid or free intraperitoneal gas. Postsurgical changes from midline laparotomy, with subcutaneous gas and fluid along the incision site, measuring up to 4.3 x 3.2 cm in transverse dimension, and extending approximately 10 cm in length. This is likely related to postoperative seroma or hematoma. There is no peripheral enhancement or organization to suggest abscess. Interval resection of the peritoneal fat necrosis seen on prior exam. Musculoskeletal: No acute or destructive bony lesions. Reconstructed images  demonstrate no additional findings. IMPRESSION: 1. Interval midline laparotomy, with subcutaneous gas and fluid along the incision line likely reflecting residual seroma or postoperative hematoma. No rim enhancement to suggest abscess. 2. No acute intra-abdominal or intrapelvic process. 3. Trace bilateral pleural effusions with minimal dependent lower lobe atelectasis. Electronically Signed   By: Sharlet Salina M.D.   On: 01/09/2022 18:29   DG Abd Portable 1V  Result Date: 01/09/2022 CLINICAL DATA:  Abdominal pain. Recent laparotomy. EXAM: PORTABLE ABDOMEN - 1 VIEW COMPARISON:  CT 01/02/2022 FINDINGS: Scattered air throughout prominent  but not abnormally dilated small bowel, favor postoperative ileus. Small volume of stool in the colon. Midline skin staples. Pelvic phleboliths. IMPRESSION: 1. Scattered air throughout prominent but not abnormally dilated small bowel, favor postoperative ileus. 2. Small volume of colonic stool. Electronically Signed   By: Narda Rutherford M.D.   On: 01/09/2022 17:32   CT ABDOMEN PELVIS WO CONTRAST  Result Date: 01/02/2022 CLINICAL DATA:  51 year old female with acute abdominal pain. Weakness, vomiting. Abnormal LFTs. EXAM: CT ABDOMEN AND PELVIS WITHOUT CONTRAST TECHNIQUE: Multidetector CT imaging of the abdomen and pelvis was performed following the standard protocol without IV contrast. RADIATION DOSE REDUCTION: This exam was performed according to the departmental dose-optimization program which includes automated exposure control, adjustment of the mA and/or kV according to patient size and/or use of iterative reconstruction technique. COMPARISON:  CT Abdomen and Pelvis 01/11/2021. FINDINGS: Lower chest: Borderline cardiomegaly. No pericardial or pleural effusion. Dependent lung base opacity, asymmetric involvement in the left lower lobe with slightly nodular peribronchial opacity there. Hepatobiliary: Chronically absent gallbladder. Hepatic steatosis is less apparent.  Bile ducts appear stable. No perihepatic free fluid. Pancreas: Partially atrophied. Spleen: Negative. Adrenals/Urinary Tract: Normal adrenal glands. Renal pelves are more prominent today, but there is no convincing hydronephrosis or pararenal inflammation. No nephrolithiasis. Urinary bladder is distended today (530 mL) but otherwise unremarkable. Distal ureters are decompressed. Pelvic phleboliths but no convincing urinary calculus. Stomach/Bowel: Redundant sigmoid colon tracks in the midline above the umbilicus. Large bowel retained stool primarily upstream of the sigmoid. No large bowel inflammation identified. Chronic right gonadal vein phleboliths. A normal appendix is identified tracking into the right hemipelvis on coronal image 43. Decompressed terminal ileum. No dilated small bowel. Evolved ventral left abdominal wall/mesentery fat necrosis since the CT last year. 5 cm loculated area of fat fluid level there are now with no surrounding inflammation (series 2, image 52) just deep to the atrophied left rectus muscle. Satisfactory evolution of other ventral abdominal wall postoperative changes since last year. Stable postoperative appearance of gastric sleeve. Fairly decompressed proximal bypassed portion of the stomach. Other stomach and duodenum nondilated. No free air, free fluid, or mesenteric inflammation identified. Vascular/Lymphatic: Normal caliber abdominal aorta with little to no calcified atherosclerosis. No lymphadenopathy identified. Reproductive: Surgically absent uterus, diminutive or absent ovaries. Other: Chronic pelvic phleboliths.  No free fluid. Musculoskeletal: No acute osseous abnormality identified. Widespread chronic subcutaneous flank nodularity may reflect subcutaneous injection sites. IMPRESSION: 1. Left greater than right lower lobe opacity at the lung bases, indeterminate for atelectasis versus infection. 2. Stable gastric sleeve. Evolved ventral left abdominal wall/mesentery 4-5 cm  area of fat necrosis since the CT last year. Residual loculated fat-fluid collection there with no complicating features. 3. No other acute or inflammatory process identified in the non-contrast abdomen or pelvis; distended urinary bladder (530 mL) - query urinary retention. Electronically Signed   By: Odessa Fleming M.D.   On: 01/02/2022 07:07   US RENAL  Result Date: 01/01/2022 CLINICAL DATA:  Acute kidney injury. EXAM: RENAL / URINARY TRACT ULTRASOUND COMPLETE COMPARISON:  None Available. FINDINGS: Right Kidney: Renal measurements: 11.8 x 5.9 x 5.5 cm = volume: 199 mL. Echogenicity within normal limits. No mass or hydronephrosis visualized. Left Kidney: Renal measurements: 11.4 x 6.3 x 6.0 cm = volume: 224 mL. Echogenicity within normal limits. No mass or hydronephrosis visualized. Bladder: Appears normal for degree of bladder distention. Other: None. IMPRESSION: Normal renal ultrasound. Electronically Signed   By: Irish Lack M.D.   On: 01/01/2022 17:53  The results of significant diagnostics from this hospitalization (including imaging, microbiology, ancillary and laboratory) are listed below for reference.     Microbiology: Recent Results (from the past 240 hour(s))  Aerobic/Anaerobic Culture w Gram Stain (surgical/deep wound)     Status: None   Collection Time: 01/05/22 12:18 PM   Specimen: Abscess  Result Value Ref Range Status   Specimen Description   Final    ABSCESS Performed at [facility], [address], [place], [place] [address]    Special Requests ABDOMEN  Final   Gram Stain NO ORGANISMS SEEN NO WBC SEEN   Final   Culture   Final    No growth aerobically or anaerobically. Performed at [facility], [address], [place], [place] [address]    Report Status 01/10/2022 FINAL  Final     Labs:  CBC: Recent Labs  Lab 01/09/22 0600 01/10/22 0703 01/11/22 0501 01/12/22 0412 01/13/22 0322  WBC 9.7 6.7 7.0 7.8 8.5  HGB 11.4* 10.5* 11.2* 10.6*  10.5*  HCT 37.5 34.2* 37.3 34.3* 33.4*  MCV 93.1 91.2 92.1 90.3 89.8  PLT 245 277 254 325 335   BMP &GFR Recent Labs  Lab 01/09/22 0600 01/10/22 0703 01/11/22 0501 01/12/22 0412 01/13/22 0322  NA 143 142 140 143 141  K 3.6 3.4* 4.1 3.9 4.2  CL 117* 117* 118* 113* 110  CO2 23 23 19* 26 28  GLUCOSE 93 94 97 99 97  BUN 7 <5* <5* <5* <5*  CREATININE 0.48 0.36* 0.41* 0.42* 0.39*  CALCIUM 8.9 8.5* 8.4* 8.7* 8.8*   Estimated Creatinine Clearance: 81.1 mL/min (A) (by C-G formula based on SCr of 0.39 mg/dL (L)). Liver & Pancreas: Recent Labs  Lab 01/07/22 0455  AST 14*  ALT 35  ALKPHOS 125  BILITOT 0.8  PROT 5.9*  ALBUMIN 3.1*   No results for input(s): "LIPASE", "AMYLASE" in the last 168 hours. No results for input(s): "AMMONIA" in the last 168 hours. Diabetic: No results for input(s): "HGBA1C" in the last 72 hours. Recent Labs  Lab 01/12/22 1955 01/12/22 2355 01/13/22 0336 01/13/22 0756 01/13/22 1144  GLUCAP 106* 96 103* 83 107*   Cardiac Enzymes: No results for input(s): "CKTOTAL", "CKMB", "CKMBINDEX", "TROPONINI" in the last 168 hours. No results for input(s): "PROBNP" in the last 8760 hours. Coagulation Profile: No results for input(s): "INR", "PROTIME" in the last 168 hours. Thyroid Function Tests: No results for input(s): "TSH", "T4TOTAL", "FREET4", "T3FREE", "THYROIDAB" in the last 72 hours. Lipid Profile: No results for input(s): "CHOL", "HDL", "LDLCALC", "TRIG", "CHOLHDL", "LDLDIRECT" in the last 72 hours. Anemia Panel: No results for input(s): "VITAMINB12", "FOLATE", "FERRITIN", "TIBC", "IRON", "RETICCTPCT" in the last 72 hours. Urine analysis:    Component Value Date/Time   COLORURINE AMBER (A) 01/01/2022 1744   APPEARANCEUR CLOUDY (A) 01/01/2022 1744   APPEARANCEUR Clear 04/29/2017 0918   LABSPEC 1.014 01/01/2022 1744   PHURINE 5.0 01/01/2022 1744   GLUCOSEU NEGATIVE 01/01/2022 1744   HGBUR NEGATIVE 01/01/2022 1744   BILIRUBINUR NEGATIVE  01/01/2022 1744   BILIRUBINUR Negative 04/29/2017 0918   KETONESUR NEGATIVE 01/01/2022 1744   PROTEINUR 30 (A) 01/01/2022 1744   NITRITE NEGATIVE 01/01/2022 1744   LEUKOCYTESUR TRACE (A) 01/01/2022 1744   Sepsis Labs: Invalid input(s): "PROCALCITONIN", "LACTICIDVEN"   SIGNED:  Almon Hercules, MD  [facility] 01/13/2022, 5:55 PM

## 2022-01-29 ENCOUNTER — Other Ambulatory Visit: Payer: Self-pay | Admitting: Surgery

## 2022-01-29 DIAGNOSIS — K651 Peritoneal abscess: Secondary | ICD-10-CM

## 2022-02-06 ENCOUNTER — Ambulatory Visit: Admission: RE | Admit: 2022-02-06 | Payer: Medicaid Other | Source: Ambulatory Visit

## 2022-02-11 ENCOUNTER — Ambulatory Visit
Admission: RE | Admit: 2022-02-11 | Discharge: 2022-02-11 | Disposition: A | Discharge status: Home / Self Care | Payer: Medicaid Other | Source: Ambulatory Visit | Attending: Surgery | Admitting: Surgery

## 2022-02-11 DIAGNOSIS — K651 Peritoneal abscess: Secondary | ICD-10-CM | POA: Diagnosis present

## 2022-02-11 MED ORDER — IOHEXOL 300 MG/ML  SOLN
100.0000 mL | Freq: Once | INTRAMUSCULAR | Status: AC | PRN
  Administered 2022-02-11: 100 mL via INTRAVENOUS

## 2022-02-24 ENCOUNTER — Other Ambulatory Visit: Payer: Self-pay | Admitting: Surgery

## 2022-02-24 DIAGNOSIS — L02211 Cutaneous abscess of abdominal wall: Secondary | ICD-10-CM

## 2022-02-27 ENCOUNTER — Ambulatory Visit
Admission: RE | Admit: 2022-02-27 | Discharge: 2022-02-27 | Disposition: A | Discharge status: Home / Self Care | Payer: Medicaid Other | Source: Ambulatory Visit | Attending: Surgery | Admitting: Surgery

## 2022-02-27 DIAGNOSIS — L02211 Cutaneous abscess of abdominal wall: Secondary | ICD-10-CM

## 2022-03-27 ENCOUNTER — Other Ambulatory Visit: Payer: Self-pay | Admitting: Surgery

## 2022-03-27 DIAGNOSIS — L02211 Cutaneous abscess of abdominal wall: Secondary | ICD-10-CM

## 2022-04-01 ENCOUNTER — Ambulatory Visit
Admission: RE | Admit: 2022-04-01 | Discharge: 2022-04-01 | Disposition: A | Discharge status: Home / Self Care | Payer: Medicaid Other | Source: Ambulatory Visit | Attending: Surgery | Admitting: Surgery

## 2022-04-01 DIAGNOSIS — L02211 Cutaneous abscess of abdominal wall: Secondary | ICD-10-CM

## 2022-04-01 MED ORDER — IOHEXOL 300 MG/ML  SOLN
100.0000 mL | Freq: Once | INTRAMUSCULAR | Status: AC | PRN
  Administered 2022-04-01: 100 mL via INTRAVENOUS

## 2022-04-01 MED ORDER — IOHEXOL 300 MG/ML  SOLN: 80.0000 mL | Freq: Once | INTRAMUSCULAR | Status: DC | PRN

## 2022-06-19 ENCOUNTER — Other Ambulatory Visit: Payer: Self-pay | Admitting: Orthopedic Surgery

## 2022-06-19 DIAGNOSIS — M5441 Lumbago with sciatica, right side: Secondary | ICD-10-CM

## 2022-06-19 DIAGNOSIS — M4800 Spinal stenosis, site unspecified: Secondary | ICD-10-CM

## 2022-07-09 ENCOUNTER — Inpatient Hospital Stay: Admission: RE | Admit: 2022-07-09 | Payer: Medicaid Other | Source: Ambulatory Visit

## 2022-07-24 ENCOUNTER — Ambulatory Visit
Admission: RE | Admit: 2022-07-24 | Discharge: 2022-07-24 | Disposition: A | Discharge status: Home / Self Care | Payer: Medicaid Other | Source: Ambulatory Visit | Attending: Orthopedic Surgery | Admitting: Orthopedic Surgery

## 2022-07-24 DIAGNOSIS — M5441 Lumbago with sciatica, right side: Secondary | ICD-10-CM

## 2022-07-24 DIAGNOSIS — M4800 Spinal stenosis, site unspecified: Secondary | ICD-10-CM

## 2022-08-15 ENCOUNTER — Other Ambulatory Visit: Payer: Self-pay

## 2022-08-15 ENCOUNTER — Emergency Department
Admission: EM | Admit: 2022-08-15 | Discharge: 2022-08-15 | Disposition: A | Discharge status: Home / Self Care | Payer: MEDICAID | Attending: Emergency Medicine | Admitting: Emergency Medicine

## 2022-08-15 ENCOUNTER — Encounter: Payer: Self-pay | Admitting: Emergency Medicine

## 2022-08-15 DIAGNOSIS — X58XXXA Exposure to other specified factors, initial encounter: Secondary | ICD-10-CM | POA: Diagnosis not present

## 2022-08-15 DIAGNOSIS — S0993XA Unspecified injury of face, initial encounter: Secondary | ICD-10-CM

## 2022-08-15 MED ORDER — OXYCODONE-ACETAMINOPHEN 5-325 MG PO TABS
1.0000 | ORAL_TABLET | Freq: Once | ORAL | Status: AC
  Administered 2022-08-15: 1 via ORAL

## 2022-08-15 MED ORDER — OXYCODONE-ACETAMINOPHEN 5-325 MG PO TABS: 1.0000 | ORAL_TABLET | Freq: Four times a day (QID) | ORAL | 0 refills | Status: DC | PRN

## 2022-08-15 MED ORDER — CLINDAMYCIN HCL 300 MG PO CAPS: 300.0000 mg | ORAL_CAPSULE | Freq: Three times a day (TID) | ORAL | 0 refills | Status: AC

## 2022-08-15 NOTE — Discharge Instructions (Signed)
Call your dentist on Monday for an appointment Take Clindamycin to prevent infection and oxycodone -acetaminophen for pain as needed.  Do not drive or operate machinery while taking pain medicaton

## 2022-08-15 NOTE — ED Triage Notes (Signed)
Pt to ED via POV for dental pain. Pt states that she was eating and broke her tooth all the way down to the root and is having severe pain. Pt is in NAD.

## 2022-08-15 NOTE — ED Provider Notes (Signed)
[facility] Provider Note    Event Date/Time   First MD Initiated Contact with Patient 08/15/22 1433     (approximate)   History   Dental Pain   HPI  Rhonda Palmer is a 52 y.o. female   presents to the ED after her tooth broke off while eating today.  She has a Education officer, community but office is not open on Fridays.  No other complaints.       Physical Exam   Triage Vital Signs: ED Triage Vitals  Encounter Vitals Group     BP 08/15/22 1255 (!) 150/99     Systolic BP Percentile --      Diastolic BP Percentile --      Pulse Rate 08/15/22 1255 97     Resp 08/15/22 1255 16     Temp 08/15/22 1255 97.6 F (36.4 C)     Temp Source 08/15/22 1255 Oral     SpO2 08/15/22 1255 97 %     Weight 08/15/22 1256 176 lb (79.8 kg)     Height 08/15/22 1256 5' (1.524 m)     Head Circumference --      Peak Flow --      Pain Score 08/15/22 1256 8     Pain Loc --      Pain Education --      Exclude from Growth Chart --     Most recent vital signs: Vitals:   08/15/22 1255  BP: (!) 150/99  Pulse: 97  Resp: 16  Temp: 97.6 F (36.4 C)  SpO2: 97%     General: Awake, no distress.  CV:  Good peripheral perfusion.  Resp:  Normal effort.  Abd:  No distention.  Other:  Right lower anterior central incisor with partial fracture visible.  No active bleeding at present.    ED Results / Procedures / Treatments   Labs (all labs ordered are listed, but only abnormal results are displayed) Labs Reviewed - No data to display    PROCEDURES:  Critical Care performed:   Procedures   MEDICATIONS ORDERED IN ED: Medications  oxyCODONE-acetaminophen (PERCOCET/ROXICET) 5-325 MG per tablet 1 tablet (has no administration in time range)     IMPRESSION / MDM / ASSESSMENT AND PLAN / ED COURSE  I reviewed the triage vital signs and the nursing notes.   Differential diagnosis includes, but is not limited to, dental pain, dental fracture.    52 yo female here after her  tooth broke off while eating today.  Physical exam is consistant with recent dental injury.  She will follow up with her dentist on Monday.  A RX for clindamycin and percocet was sent to the pharmacy. She is aware that there will be sensitivity to cold and heat and chew on opposite side.        Patient's presentation is most consistent with acute, uncomplicated illness.  FINAL CLINICAL IMPRESSION(S) / ED DIAGNOSES   Final diagnoses:  Dental injury, initial encounter     Rx / DC Orders   ED Discharge Orders          Ordered    clindamycin (CLEOCIN) 300 MG capsule  3 times daily        08/15/22 1454    oxyCODONE-acetaminophen (PERCOCET) 5-325 MG tablet  Every 6 hours PRN        08/15/22 1454             Note:  This document was prepared using Dragon voice recognition  software and may include unintentional dictation errors.   Tommi Rumps, PA-C 08/15/22 1501    Sharman Cheek, MD 08/17/22 [phone]

## 2022-10-06 ENCOUNTER — Other Ambulatory Visit: Payer: Self-pay

## 2022-10-06 ENCOUNTER — Emergency Department: Payer: MEDICAID

## 2022-10-06 ENCOUNTER — Emergency Department
Admission: EM | Admit: 2022-10-06 | Discharge: 2022-10-06 | Disposition: A | Discharge status: Home / Self Care | Payer: MEDICAID | Attending: Emergency Medicine | Admitting: Emergency Medicine

## 2022-10-06 DIAGNOSIS — E039 Hypothyroidism, unspecified: Secondary | ICD-10-CM | POA: Diagnosis not present

## 2022-10-06 DIAGNOSIS — I110 Hypertensive heart disease with heart failure: Secondary | ICD-10-CM | POA: Insufficient documentation

## 2022-10-06 DIAGNOSIS — J45909 Unspecified asthma, uncomplicated: Secondary | ICD-10-CM | POA: Diagnosis not present

## 2022-10-06 DIAGNOSIS — I509 Heart failure, unspecified: Secondary | ICD-10-CM | POA: Diagnosis not present

## 2022-10-06 DIAGNOSIS — E119 Type 2 diabetes mellitus without complications: Secondary | ICD-10-CM | POA: Insufficient documentation

## 2022-10-06 DIAGNOSIS — R079 Chest pain, unspecified: Secondary | ICD-10-CM | POA: Insufficient documentation

## 2022-10-06 LAB — BASIC METABOLIC PANEL
Anion gap: 11 (ref 5–15)
BUN: 17 mg/dL (ref 6–20)
CO2: 26 mmol/L (ref 22–32)
Calcium: 9.3 mg/dL (ref 8.9–10.3)
Chloride: 101 mmol/L (ref 98–111)
Creatinine, Ser: 0.69 mg/dL (ref 0.44–1.00)
GFR, Estimated: >60 mL/min (ref >60)
Glucose, Bld: 106 mg/dL — ABNORMAL HIGH (ref 70–99)
Potassium: 3.7 mmol/L (ref 3.5–5.1)
Sodium: 138 mmol/L (ref 135–145)

## 2022-10-06 LAB — CBC
HCT: 41.1 % (ref 36.0–46.0)
Hemoglobin: 13 g/dL (ref 12.0–15.0)
MCH: 28.6 pg (ref 26.0–34.0)
MCHC: 31.6 g/dL (ref 30.0–36.0)
MCV: 90.3 fL (ref 80.0–100.0)
Platelets: 254 10*3/uL (ref 150–400)
RBC: 4.55 MIL/uL (ref 3.87–5.11)
RDW: 13.2 % (ref 11.5–15.5)
WBC: 9.1 10*3/uL (ref 4.0–10.5)
nRBC: 0 % (ref 0.0–0.2)

## 2022-10-06 LAB — LIPASE, BLOOD: Lipase: 28 U/L (ref 11–51)

## 2022-10-06 LAB — HEPATIC FUNCTION PANEL
ALT: 11 U/L (ref 0–44)
AST: 20 U/L (ref 15–41)
Albumin: 2.8 g/dL — ABNORMAL LOW (ref 3.5–5.0)
Alkaline Phosphatase: 77 U/L (ref 38–126)
Bilirubin, Direct: 0.2 mg/dL (ref 0.0–0.2)
Indirect Bilirubin: 0.4 mg/dL (ref 0.3–0.9)
Total Bilirubin: 0.6 mg/dL (ref 0.3–1.2)
Total Protein: 5.3 g/dL — ABNORMAL LOW (ref 6.5–8.1)

## 2022-10-06 LAB — D-DIMER, QUANTITATIVE: D-Dimer, Quant: 0.44 ug{FEU}/mL (ref 0.00–0.50)

## 2022-10-06 LAB — TROPONIN I (HIGH SENSITIVITY)
Troponin I (High Sensitivity): 4 ng/L (ref <18)
Troponin I (High Sensitivity): 4 ng/L (ref <18)

## 2022-10-06 MED ORDER — OXYCODONE-ACETAMINOPHEN 5-325 MG PO TABS
1.0000 | ORAL_TABLET | Freq: Four times a day (QID) | ORAL | 0 refills | Status: AC | PRN
Start: 2022-10-06 — End: 2022-10-11

## 2022-10-06 MED ORDER — MORPHINE SULFATE (PF) 4 MG/ML IV SOLN
4.0000 mg | Freq: Once | INTRAVENOUS | Status: AC
  Administered 2022-10-06: 4 mg via INTRAVENOUS
  Filled 2022-10-06: qty 1

## 2022-10-06 MED ORDER — COLCHICINE (CARDIOVASCULAR) 0.5 MG PO TABS: 0.5000 mg | ORAL_TABLET | Freq: Two times a day (BID) | ORAL | 0 refills | Status: AC

## 2022-10-06 NOTE — ED Notes (Signed)
Pt report increased CP. Repeat EKG done at this time

## 2022-10-06 NOTE — ED Provider Notes (Signed)
[facility] Provider Note    Event Date/Time   First MD Initiated Contact with Patient 10/06/22 1641     (approximate)   History   Chest Pain   HPI  Rhonda Palmer is a 52 year old female with history of hypertension, DM, asthma, hypothyroidism, CHF, schizoaffective disorder presenting to the emergency department for evaluation of chest pain.  Around 10 PM today, patient had onset of chest pain described as a pressure sensation in the center of her chest radiating to her back.  It is worse with deep breaths.  It improves with leaning forward.  No fevers or chills.  Denies history of similar.  Received nitroglycerin with minimal improvement with EMS.     Physical Exam   Triage Vital Signs: ED Triage Vitals  Encounter Vitals Group     BP 10/06/22 1421 104/67     Systolic BP Percentile --      Diastolic BP Percentile --      Pulse Rate 10/06/22 1421 88     Resp 10/06/22 1421 17     Temp 10/06/22 1421 98.3 F (36.8 C)     Temp Source 10/06/22 1421 Oral     SpO2 10/06/22 1421 98 %     Weight 10/06/22 1422 175 lb 14.8 oz (79.8 kg)     Height 10/06/22 1422 5' (1.524 m)     Head Circumference --      Peak Flow --      Pain Score 10/06/22 1422 5     Pain Loc --      Pain Education --      Exclude from Growth Chart --     Most recent vital signs: Vitals:   10/06/22 1421 10/06/22 1621  BP: 104/67 96/75  Pulse: 88 79  Resp: 17 16  Temp: 98.3 F (36.8 C)   SpO2: 98% 100%     General: Awake, interactive  CV:  Regular rate, good peripheral perfusion.  Chest wall: Mild tenderness to palpation Resp:  Lungs clear, unlabored respirations.  Abd:  Soft, nondistended.  Neuro:  Symmetric facial movement, fluid speech   ED Results / Procedures / Treatments   Labs (all labs ordered are listed, but only abnormal results are displayed) Labs Reviewed  BASIC METABOLIC PANEL - Abnormal; Notable for the following components:      Result Value    Glucose, Bld 106 (*)    All other components within normal limits  HEPATIC FUNCTION PANEL - Abnormal; Notable for the following components:   Total Protein 5.3 (*)    Albumin 2.8 (*)    All other components within normal limits  CBC  LIPASE, BLOOD  D-DIMER, QUANTITATIVE  TROPONIN I (HIGH SENSITIVITY)  TROPONIN I (HIGH SENSITIVITY)     EKG EKG independently reviewed interpreted by myself (ER attending) demonstrates:  EKG demonstrates normal sinus rhythm at a rate of 82, PR 144, QRS 88, QTc 439, very slight diffuse ST elevations  RADIOLOGY Imaging independently reviewed and interpreted by myself demonstrates:  CXR without focal consolidation  PROCEDURES:  Critical Care performed: No  Procedures   MEDICATIONS ORDERED IN ED: Medications  morphine (PF) 4 MG/ML injection 4 mg (4 mg Intravenous Given 10/06/22 1739)     IMPRESSION / MDM / ASSESSMENT AND PLAN / ED COURSE  I reviewed the triage vital signs and the nursing notes.  Differential diagnosis includes, but is not limited to, ACS, PE, pneumonia, pneumothorax, pericarditis, musculoskeletal pain, pancreatitis  Patient's presentation is most  consistent with acute presentation with potential threat to life or bodily function.  52 year old female presenting to the emergency department for evaluation of chest pain.  Labs sent from triage without significant derangement.  Additional labs including lipase, D-dimer added on which also returned within normal limits.  Clinical history somewhat suggestive of pericarditis given pleuritic pain that is improved with leaning forward.  Bedside ultrasound performed without evidence of pericardial effusion.  Does have perhaps slight ST elevation on her EKG noted no clear PR depression.  Low risk heart score.  Do think she is stable for discharge.  She reports that she has been structured not to take NSAIDs by her physician.  With this, will DC with colchicine and a short course of pain medicine  for management  with plans for outpatient follow-up for further evaluation..  Strict return precautions provided.  Patient discharged in stable condition.    FINAL CLINICAL IMPRESSION(S) / ED DIAGNOSES   Final diagnoses:  Acute chest pain     Rx / DC Orders   ED Discharge Orders          Ordered    Colchicine, Cardiovascular, 0.5 MG TABS  2 times daily        10/06/22 1842    oxyCODONE-acetaminophen (PERCOCET) 5-325 MG tablet  Every 6 hours PRN        10/06/22 1842             Note:  This document was prepared using Dragon voice recognition software and may include unintentional dictation errors.   Trinna Post, MD 10/06/22 [phone]

## 2022-10-06 NOTE — Discharge Instructions (Addendum)
You were seen in the emergency room today for evaluation of your chest pain.  This could be related to an inflammatory process called pericarditis.  I sent a prescription for an anti-inflammatory medicine.  Please take this unless you are otherwise directed by your physician.  Of also sent a short course of pain medicine.  Do not drive right machinery when taking this.  Return to the ER for new or worsening symptoms.

## 2022-10-06 NOTE — ED Triage Notes (Signed)
Pt here via [facility] with cp since 1000. Pt also having dizziness and fatigue. Pt states pain is left sided with right arm tingling. 7/10 pain, 22 G L hand. 2 nitroglycerin sprays given, pt states it has helped with pain.   98-cbg 102/68 99% RA

## 2022-10-07 NOTE — ED Notes (Signed)
[facility] called regarding colchicine 0.5 mg rx.  Says it is not covered and very expensive.  Asking to change dose to 0.6 mg, which will be covered.  Per dr. [place] Charon that is okay.

## 2022-11-20 DIAGNOSIS — R937 Abnormal findings on diagnostic imaging of other parts of musculoskeletal system: Secondary | ICD-10-CM | POA: Insufficient documentation

## 2022-11-20 DIAGNOSIS — Z9884 Bariatric surgery status: Secondary | ICD-10-CM | POA: Insufficient documentation

## 2022-11-20 DIAGNOSIS — M899 Disorder of bone, unspecified: Secondary | ICD-10-CM | POA: Insufficient documentation

## 2022-11-20 DIAGNOSIS — Z79899 Other long term (current) drug therapy: Secondary | ICD-10-CM | POA: Insufficient documentation

## 2022-11-20 DIAGNOSIS — Z789 Other specified health status: Secondary | ICD-10-CM | POA: Insufficient documentation

## 2022-11-20 NOTE — Patient Instructions (Addendum)
GENERAL RISKS AND COMPLICATIONS  What are the risk, side effects and possible complications? Generally speaking, most procedures are safe.  However, with any procedure there are risks, side effects, and the possibility of complications.  The risks and complications are dependent upon the sites that are lesioned, or the type of nerve block to be performed.  The closer the procedure is to the spine, the more serious the risks are.  Great care is taken when placing the radio frequency needles, block needles or lesioning probes, but sometimes complications can occur. Infection: Any time there is an injection through the skin, there is a risk of infection.  This is why sterile conditions are used for these blocks.  There are four possible types of infection. Localized skin infection. Central Nervous System Infection-This can be in the form of Meningitis, which can be deadly. Epidural Infections-This can be in the form of an epidural abscess, which can cause pressure inside of the spine, causing compression of the spinal cord with subsequent paralysis. This would require an emergency surgery to decompress, and there are no guarantees that the patient would recover from the paralysis. Discitis-This is an infection of the intervertebral discs.  It occurs in about 1% of discography procedures.  It is difficult to treat and it may lead to surgery.        2. Pain: the needles have to go through skin and soft tissues, will cause soreness.       3. Damage to internal structures:  The nerves to be lesioned may be near blood vessels or    other nerves which can be potentially damaged.       4. Bleeding: Bleeding is more common if the patient is taking blood thinners such as  aspirin, Coumadin, Ticiid, Plavix, etc., or if he/she have some genetic predisposition  such as hemophilia. Bleeding into the spinal canal can cause compression of the spinal  cord with subsequent paralysis.  This would require an emergency  surgery to  decompress and there are no guarantees that the patient would recover from the  paralysis.       5. Pneumothorax:  Puncturing of a lung is a possibility, every time a needle is introduced in  the area of the chest or upper back.  Pneumothorax refers to free air around the  collapsed lung(s), inside of the thoracic cavity (chest cavity).  Another two possible  complications related to a similar event would include: Hemothorax and Chylothorax.   These are variations of the Pneumothorax, where instead of air around the collapsed  lung(s), you may have blood or chyle, respectively.       6. Spinal headaches: They may occur with any procedures in the area of the spine.       7. Persistent CSF (Cerebro-Spinal Fluid) leakage: This is a rare problem, but may occur  with prolonged intrathecal or epidural catheters either due to the formation of a fistulous  track or a dural tear.       8. Nerve damage: By working so close to the spinal cord, there is always a possibility of  nerve damage, which could be as serious as a permanent spinal cord injury with  paralysis.       9. Death:  Although rare, severe deadly allergic reactions known as "Anaphylactic  reaction" can occur to any of the medications used.      10. Worsening of the symptoms:  We can always make thing worse.  What are the chances  of something like this happening? Chances of any of this occuring are extremely low.  By statistics, you have more of a chance of getting killed in a motor vehicle accident: while driving to the hospital than any of the above occurring .  Nevertheless, you should be aware that they are possibilities.  In general, it is similar to taking a shower.  Everybody knows that you can slip, hit your head and get killed.  Does that mean that you should not shower again?  Nevertheless always keep in mind that statistics do not mean anything if you happen to be on the wrong side of them.  Even if a procedure has a 1 (one) in a  1,000,000 (million) chance of going wrong, it you happen to be that one..Also, keep in mind that by statistics, you have more of a chance of having something go wrong when taking medications.  Who should not have this procedure? If you are on a blood thinning medication (e.g. Coumadin, Plavix, see list of "Blood Thinners"), or if you have an active infection going on, you should not have the procedure.  If you are taking any blood thinners, please inform your physician.  How should I prepare for this procedure? Do not eat or drink anything at least six hours prior to the procedure. Bring a driver with you .  It cannot be a taxi. Come accompanied by an adult that can drive you back, and that is strong enough to help you if your legs get weak or numb from the local anesthetic. Take all of your medicines the morning of the procedure with just enough water to swallow them. If you have diabetes, make sure that you are scheduled to have your procedure done first thing in the morning, whenever possible. If you have diabetes, take only half of your insulin dose and notify our nurse that you have done so as soon as you arrive at the clinic. If you are diabetic, but only take blood sugar pills (oral hypoglycemic), then do not take them on the morning of your procedure.  You may take them after you have had the procedure. Do not take aspirin or any aspirin-containing medications, at least eleven (11) days prior to the procedure.  They may prolong bleeding. Wear loose fitting clothing that may be easy to take off and that you would not mind if it got stained with Betadine or blood. Do not wear any jewelry or perfume Remove any nail coloring.  It will interfere with some of our monitoring equipment.  NOTE: Remember that this is not meant to be interpreted as a complete list of all possible complications.  Unforeseen problems may occur.  BLOOD THINNERS The following drugs contain aspirin or other products,  which can cause increased bleeding during surgery and should not be taken for 2 weeks prior to and 1 week after surgery.  If you should need take something for relief of minor pain, you may take acetaminophen which is found in Tylenol,m Datril, Anacin-3 and Panadol. It is not blood thinner. The products listed below are.  Do not take any of the products listed below in addition to any listed on your instruction sheet.  A.P.C or A.P.C with Codeine Codeine Phosphate Capsules #3 Ibuprofen Ridaura  ABC compound Congesprin Imuran rimadil  Advil Cope Indocin Robaxisal  Alka-Seltzer Effervescent Pain Reliever and Antacid Coricidin or Coricidin-D  Indomethacin Rufen  Alka-Seltzer plus Cold Medicine Cosprin Ketoprofen S-A-C Tablets  Anacin Analgesic Tablets or Capsules Coumadin  Korlgesic Salflex  Anacin Extra Strength Analgesic tablets or capsules CP-2 Tablets Lanoril Salicylate  Anaprox Cuprimine Capsules Levenox Salocol  Anexsia-D Dalteparin Magan Salsalate  Anodynos Darvon compound Magnesium Salicylate Sine-off  Ansaid Dasin Capsules Magsal Sodium Salicylate  Anturane Depen Capsules Marnal Soma  APF Arthritis pain formula Dewitt's Pills Measurin Stanback  Argesic Dia-Gesic Meclofenamic Sulfinpyrazone  Arthritis Bayer Timed Release Aspirin Diclofenac Meclomen Sulindac  Arthritis pain formula Anacin Dicumarol Medipren Supac  Analgesic (Safety coated) Arthralgen Diffunasal Mefanamic Suprofen  Arthritis Strength Bufferin Dihydrocodeine Mepro Compound Suprol  Arthropan liquid Dopirydamole Methcarbomol with Aspirin Synalgos  ASA tablets/Enseals Disalcid Micrainin Tagament  Ascriptin Doan's Midol Talwin  Ascriptin A/D Dolene Mobidin Tanderil  Ascriptin Extra Strength Dolobid Moblgesic Ticlid  Ascriptin with Codeine Doloprin or Doloprin with Codeine Momentum Tolectin  Asperbuf Duoprin Mono-gesic Trendar  Aspergum Duradyne Motrin or Motrin IB Triminicin  Aspirin plain, buffered or enteric coated  Durasal Myochrisine Trigesic  Aspirin Suppositories Easprin Nalfon Trillsate  Aspirin with Codeine Ecotrin Regular or Extra Strength Naprosyn Uracel  Atromid-S Efficin Naproxen Ursinus  Auranofin Capsules Elmiron Neocylate Vanquish  Axotal Emagrin Norgesic Verin  Azathioprine Empirin or Empirin with Codeine Normiflo Vitamin E  Azolid Emprazil Nuprin Voltaren  Bayer Aspirin plain, buffered or children's or timed BC Tablets or powders Encaprin Orgaran Warfarin Sodium  Buff-a-Comp Enoxaparin Orudis Zorpin  Buff-a-Comp with Codeine Equegesic Os-Cal-Gesic   Buffaprin Excedrin plain, buffered or Extra Strength Oxalid   Bufferin Arthritis Strength Feldene Oxphenbutazone   Bufferin plain or Extra Strength Feldene Capsules Oxycodone with Aspirin   Bufferin with Codeine Fenoprofen Fenoprofen Pabalate or Pabalate-SF   Buffets II Flogesic Panagesic   Buffinol plain or Extra Strength Florinal or Florinal with Codeine Panwarfarin   Buf-Tabs Flurbiprofen Penicillamine   Butalbital Compound Four-way cold tablets Penicillin   Butazolidin Fragmin Pepto-Bismol   Carbenicillin Geminisyn Percodan   Carna Arthritis Reliever Geopen Persantine   Carprofen Gold's salt Persistin   Chloramphenicol Goody's Phenylbutazone   Chloromycetin Haltrain Piroxlcam   Clmetidine heparin Plaquenil   Cllnoril Hyco-pap Ponstel   Clofibrate Hydroxy chloroquine Propoxyphen         Before stopping any of these medications, be sure to consult the physician who ordered them.  Some, such as Coumadin (Warfarin) are ordered to prevent or treat serious conditions such as "deep thrombosis", "pumonary embolisms", and other heart problems.  The amount of time that you may need off of the medication may also vary with the medication and the reason for which you were taking it.  If you are taking any of these medications, please make sure you notify your pain physician before you undergo any procedures.         Pain Management  Discharge Instructions  General Discharge Instructions :  If you need to reach your doctor call: Monday-Friday 8:00 am - 4:00 pm at [phone] or toll free [phone].  After clinic hours [phone] to have operator reach doctor.  Bring all of your medication bottles to all your appointments in the pain clinic.  To cancel or reschedule your appointment with Pain Management please remember to call 24 hours in advance to avoid a fee.  Refer to the educational materials which you have been given on: General Risks, I had my Procedure. Discharge Instructions, Post Sedation.  Post Procedure Instructions:  The drugs you were given will stay in your system until tomorrow, so for the next 24 hours you should not drive, make any legal decisions or drink any alcoholic beverages.  You may eat  anything you prefer, but it is better to start with liquids then soups and crackers, and gradually work up to solid foods.  Please notify your doctor immediately if you have any unusual bleeding, trouble breathing or pain that is not related to your normal pain.  Depending on the type of procedure that was done, some parts of your body may feel week and/or numb.  This usually clears up by tonight or the next day.  Walk with the use of an assistive device or accompanied by an adult for the 24 hours.  You may use ice on the affected area for the first 24 hours.  Put ice in a Ziploc bag and cover with a towel and place against area 15 minutes on 15 minutes off.  You may switch to heat after 24 hours.Epidural Steroid Injection Patient Information  Description: The epidural space surrounds the nerves as they exit the spinal cord.  In some patients, the nerves can be compressed and inflamed by a bulging disc or a tight spinal canal (spinal stenosis).  By injecting steroids into the epidural space, we can bring irritated nerves into direct contact with a potentially helpful medication.  These steroids act  directly on the irritated nerves and can reduce swelling and inflammation which often leads to decreased pain.  Epidural steroids may be injected anywhere along the spine and from the neck to the low back depending upon the location of your pain.   After numbing the skin with local anesthetic (like Novocaine), a small needle is passed into the epidural space slowly.  You may experience a sensation of pressure while this is being done.  The entire block usually last less than 10 minutes.  Conditions which may be treated by epidural steroids:  Low back and leg pain Neck and arm pain Spinal stenosis Post-laminectomy syndrome Herpes zoster (shingles) pain Pain from compression fractures  Preparation for the injection:  Do not eat any solid food or dairy products within 8 hours of your appointment.  You may drink clear liquids up to 3 hours before appointment.  Clear liquids include water, black coffee, juice or soda.  No milk or cream please. You may take your regular medication, including pain medications, with a sip of water before your appointment  Diabetics should hold regular insulin (if taken separately) and take 1/2 normal NPH dos the morning of the procedure.  Carry some sugar containing items with you to your appointment. A driver must accompany you and be prepared to drive you home after your procedure.  Bring all your current medications with your. An IV may be inserted and sedation may be given at the discretion of the physician.   A blood pressure cuff, EKG and other monitors will often be applied during the procedure.  Some patients may need to have extra oxygen administered for a short period. You will be asked to provide medical information, including your allergies, prior to the procedure.  We must know immediately if you are taking blood thinners (like Coumadin/Warfarin)  Or if you are allergic to IV iodine contrast (dye). We must know if you could possible be pregnant.  Possible  side-effects: Bleeding from needle site Infection (rare, may require surgery) Nerve injury (rare) Numbness & tingling (temporary) Difficulty urinating (rare, temporary) Spinal headache ( a headache worse with upright posture) Light -headedness (temporary) Pain at injection site (several days) Decreased blood pressure (temporary) Weakness in arm/leg (temporary) Pressure sensation in back/neck (temporary)  Call if you experience: Fever/chills associated  with headache or increased back/neck pain. Headache worsened by an upright position. New onset weakness or numbness of an extremity below the injection site Hives or difficulty breathing (go to the emergency room) Inflammation or drainage at the infection site Severe back/neck pain Any new symptoms which are concerning to you  Please note:  Although the local anesthetic injected can often make your back or neck feel good for several hours after the injection, the pain will likely return.  It takes 3-7 days for steroids to work in the epidural space.  You may not notice any pain relief for at least that one week.  If effective, we will often do a series of three injections spaced 3-6 weeks apart to maximally decrease your pain.  After the initial series, we generally will wait several months before considering a repeat injection of the same type.  If you have any questions, please call [phone] [facility] ______________________________________________________________________    New Patients  Welcome to [place] [facility].   Initial Visit The first or initial visit consists of an evaluation only.   Interventional pain management.  We offer therapies other than opioid controlled substances to manage chronic pain. These include, but are not limited to, diagnostic, therapeutic, and palliative specialized injection therapies (i.e.: Epidural  Steroids, Facet Blocks, etc.). We specialize in a variety of nerve blocks as well as radiofrequency treatments. We offer pain implant evaluations and trials, as well as follow up management. In addition we also provide a variety joint injections, including Viscosupplementation (AKA: Gel Therapy).  Prescription Pain Medication. We specialize in alternatives to opioids. We can provide evaluations and recommendations for/of pharmacologic therapies based on CDC Guidelines.  We no longer take patients for long-term medication management. We will not be taking over your pain medications.  ______________________________________________________________________      ______________________________________________________________________    Patient Information update  To: All of our patients.  Re: Name change.  It has been made official that our current name, "[facility]"   will soon be changed to "[place] [facility]".   The purpose of this change is to eliminate any confusion created by the concept of our practice being a "Medication Management Pain Clinic". In the past this has led to the misconception that we treat pain primarily by the use of prescription medications.  Nothing can be farther from the truth.   Understanding PAIN MANAGEMENT: To further understand what our practice does, you first have to understand that "Pain Management" is a subspecialty that requires additional training once a physician has completed their specialty training, which can be in either Anesthesia, Neurology, Psychiatry, or Physical Medicine and Rehabilitation (PMR). Each one of these contributes to the final approach taken by each physician to the management of their patient's pain. To be a "Pain Management Specialist" you must have first completed one of the specialty trainings below.  Anesthesiologists - trained in  clinical pharmacology and interventional techniques such as nerve blockade and regional as well as central neuroanatomy. They are trained to block pain before, during, and after surgical interventions.  Neurologists - trained in the diagnosis and pharmacological treatment of complex neurological conditions, such as Multiple Sclerosis, Parkinson's, spinal cord injuries, and other systemic conditions that may be associated with symptoms that may include but are not limited to pain. They tend to rely primarily on the treatment of chronic pain using  prescription medications.  Psychiatrist - trained in conditions affecting the psychosocial wellbeing of patients including but not limited to depression, anxiety, schizophrenia, personality disorders, addiction, and other substance use disorders that may be associated with chronic pain. They tend to rely primarily on the treatment of chronic pain using prescription medications.   Physical Medicine and Rehabilitation (PMR) physicians, also known as physiatrists - trained to treat a wide variety of medical conditions affecting the brain, spinal cord, nerves, bones, joints, ligaments, muscles, and tendons. Their training is primarily aimed at treating patients that have suffered injuries that have caused severe physical impairment. Their training is primarily aimed at the physical therapy and rehabilitation of those patients. They may also work alongside orthopedic surgeons or neurosurgeons using their expertise in assisting surgical patients to recover after their surgeries.  INTERVENTIONAL PAIN MANAGEMENT is sub-subspecialty of Pain Management.  Our physicians are Board-certified in Anesthesia, Pain Management, and Interventional Pain Management.  This meaning that not only have they been trained and Board-certified in their specialty of Anesthesia, and subspecialty of Pain Management, but they have also received further training in the sub-subspecialty of  Interventional Pain Management, in order to become Board-certified as INTERVENTIONAL PAIN MANAGEMENT SPECIALIST.    Mission: Our goal is to use our skills in  INTERVENTIONAL PAIN MANAGEMENT as alternatives to the chronic use of prescription opioid medications for the treatment of pain. To make this more clear, we have changed our name to reflect what we do and offer. We will continue to offer medication management assessment and recommendations, but we will not be taking over any patient's medication management.  ______________________________________________________________________      ______________________________________________________________________    Procedure instructions  Stop blood-thinners  Do not eat or drink fluids (other than water) for 6 hours before your procedure  No water for 2 hours before your procedure  Take your blood pressure medicine with a sip of water  Arrive 30 minutes before your appointment  If sedation is planned, bring suitable driver. Pennie Banter, Benedetto Goad, & public transportation are NOT APPROVED)  Carefully read the "Preparing for your procedure" detailed instructions  If you have questions call us at [phone]  ______________________________________________________________________      ______________________________________________________________________    Preparing for your procedure  Appointments: If you think you may not be able to keep your appointment, call 24-48 hours in advance to cancel. We need time to make it available to others.  During your procedure appointment there will be: No Prescription Refills. No disability issues to discussed. No medication changes or discussions.  Instructions: Food intake: Avoid eating anything solid for at least 8 hours prior to your procedure. Clear liquid intake: You may take clear liquids such as water up to 2 hours prior to your procedure. (No carbonated drinks. No soda.) Transportation:  Unless otherwise stated by your physician, bring a driver. (Driver cannot be a Market researcher, Pharmacist, community, or any other form of public transportation.) Morning Medicines: Except for blood thinners, take all of your other morning medications with a sip of water. Make sure to take your heart and blood pressure medicines. If your blood pressure's lower number is above 100, the case will be rescheduled. Blood thinners: Make sure to stop your blood thinners as instructed.  If you take a blood thinner, but were not instructed to stop it, call our office [phone] and ask to talk to a nurse. Not stopping a blood thinner prior to certain procedures could lead to serious complications. Diabetics on insulin: Notify the  staff so that you can be scheduled 1st case in the morning. If your diabetes requires high dose insulin, take only  of your normal insulin dose the morning of the procedure and notify the staff that you have done so. Preventing infections: Shower with an antibacterial soap the morning of your procedure.  Build-up your immune system: Take 1000 mg of Vitamin C with every meal (3 times a day) the day prior to your procedure. Antibiotics: Inform the nursing staff if you are taking any antibiotics or if you have any conditions that may require antibiotics prior to procedures. (Example: recent joint implants)   Pregnancy: If you are pregnant make sure to notify the nursing staff. Not doing so may result in injury to the fetus, including death.  Sickness: If you have a cold, fever, or any active infections, call and cancel or reschedule your procedure. Receiving steroids while having an infection may result in complications. Arrival: You must be in the facility at least 30 minutes prior to your scheduled procedure. Tardiness: Your scheduled time is also the cutoff time. If you do not arrive at least 15 minutes prior to your procedure, you will be rescheduled.  Children: Do not bring any children with you. Make  arrangements to keep them home. Dress appropriately: There is always a possibility that your clothing may get soiled. Avoid long dresses. Valuables: Do not bring any jewelry or valuables.  Reasons to call and reschedule or cancel your procedure: (Following these recommendations will minimize the risk of a serious complication.) Surgeries: Avoid having procedures within 2 weeks of any surgery. (Avoid for 2 weeks before or after any surgery). Flu Shots: Avoid having procedures within 2 weeks of a flu shots or . (Avoid for 2 weeks before or after immunizations). Barium: Avoid having a procedure within 7-10 days after having had a radiological study involving the use of radiological contrast. (Myelograms, Barium swallow or enema study). Heart attacks: Avoid any elective procedures or surgeries for the initial 6 months after a "Myocardial Infarction" (Heart Attack). Blood thinners: It is imperative that you stop these medications before procedures. Let us know if you if you take any blood thinner.  Infection: Avoid procedures during or within two weeks of an infection (including chest colds or gastrointestinal problems). Symptoms associated with infections include: Localized redness, fever, chills, night sweats or profuse sweating, burning sensation when voiding, cough, congestion, stuffiness, runny nose, sore throat, diarrhea, nausea, vomiting, cold or Flu symptoms, recent or current infections. It is specially important if the infection is over the area that we intend to treat. Heart and lung problems: Symptoms that may suggest an active cardiopulmonary problem include: cough, chest pain, breathing difficulties or shortness of breath, dizziness, ankle swelling, uncontrolled high or unusually low blood pressure, and/or palpitations. If you are experiencing any of these symptoms, cancel your procedure and contact your primary care physician for an evaluation.  Remember:  Regular Business hours are:  Monday  to Thursday 8:00 AM to 4:00 PM  Provider's Schedule: Delano Metz, MD:  Procedure days: Tuesday and Thursday 7:30 AM to 4:00 PM  Edward Jolly, MD:  Procedure days: Monday and Wednesday 7:30 AM to 4:00 PM Last  Updated: 09/16/2022 ______________________________________________________________________      ______________________________________________________________________    General Risks and Possible Complications  Patient Responsibilities: It is important that you read this as it is part of your informed consent. It is our duty to inform you of the risks and possible complications associated with treatments offered to  you. It is your responsibility as a patient to read this and to ask questions about anything that is not clear or that you believe was not covered in this document.  Patient's Rights: You have the right to refuse treatment. You also have the right to change your mind, even after initially having agreed to have the treatment done. However, under this last option, if you wait until the last second to change your mind, you may be charged for the materials used up to that point.  Introduction: Medicine is not an Visual merchandiser. Everything in Medicine, including the lack of treatment(s), carries the potential for danger, harm, or loss (which is by definition: Risk). In Medicine, a complication is a secondary problem, condition, or disease that can aggravate an already existing one. All treatments carry the risk of possible complications. The fact that a side effects or complications occurs, does not imply that the treatment was conducted incorrectly. It must be clearly understood that these can happen even when everything is done following the highest safety standards.  No treatment: You can choose not to proceed with the proposed treatment alternative. The "PRO(s)" would include: avoiding the risk of complications associated with the therapy. The "CON(s)" would include: not  getting any of the treatment benefits. These benefits fall under one of three categories: diagnostic; therapeutic; and/or palliative. Diagnostic benefits include: getting information which can ultimately lead to improvement of the disease or symptom(s). Therapeutic benefits are those associated with the successful treatment of the disease. Finally, palliative benefits are those related to the decrease of the primary symptoms, without necessarily curing the condition (example: decreasing the pain from a flare-up of a chronic condition, such as incurable terminal cancer).  General Risks and Complications: These are associated to most interventional treatments. They can occur alone, or in combination. They fall under one of the following six (6) categories: no benefit or worsening of symptoms; bleeding; infection; nerve damage; allergic reactions; and/or death. No benefits or worsening of symptoms: In Medicine there are no guarantees, only probabilities. No healthcare provider can ever guarantee that a medical treatment will work, they can only state the probability that it may. Furthermore, there is always the possibility that the condition may worsen, either directly, or indirectly, as a consequence of the treatment. Bleeding: This is more common if the patient is taking a blood thinner, either prescription or over the counter (example: Goody Powders, Fish oil, Aspirin, Garlic, etc.), or if suffering a condition associated with impaired coagulation (example: Hemophilia, cirrhosis of the liver, low platelet counts, etc.). However, even if you do not have one on these, it can still happen. If you have any of these conditions, or take one of these drugs, make sure to notify your treating physician. Infection: This is more common in patients with a compromised immune system, either due to disease (example: diabetes, cancer, human immunodeficiency virus [HIV], etc.), or due to medications or treatments (example:  therapies used to treat cancer and rheumatological diseases). However, even if you do not have one on these, it can still happen. If you have any of these conditions, or take one of these drugs, make sure to notify your treating physician. Nerve Damage: This is more common when the treatment is an invasive one, but it can also happen with the use of medications, such as those used in the treatment of cancer. The damage can occur to small secondary nerves, or to large primary ones, such as those in the spinal cord and brain.  This damage may be temporary or permanent and it may lead to impairments that can range from temporary numbness to permanent paralysis and/or brain death. Allergic Reactions: Any time a substance or material comes in contact with our body, there is the possibility of an allergic reaction. These can range from a mild skin rash (contact dermatitis) to a severe systemic reaction (anaphylactic reaction), which can result in death. Death: In general, any medical intervention can result in death, most of the time due to an unforeseen complication. ______________________________________________________________________

## 2022-11-20 NOTE — Progress Notes (Unsigned)
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 Results  Component Value Date   PLT 254 10/06/2022   DDIMER 0.44 10/06/2022     Cardiovascular Lab Results  Component Value Date   BNP 29.4 01/11/2021   CKTOTAL 31 (L) 01/01/2022   TROPONINI <0.03 02/23/2018   HGB 13.0 10/06/2022   HCT 41.1 10/06/2022     Screening Lab Results  Component Value Date   SARSCOV2NAA NEGATIVE 01/01/2022   HCVAB NON REACTIVE 01/02/2022   HIV Non Reactive 01/02/2022   PREGTESTUR NEGATIVE 12/04/2020     Cancer No results found for: "CEA", "CA125", "LABCA2"   Allergens No results found for: "ALMOND", "APPLE", "ASPARAGUS", "AVOCADO", "BANANA", "BARLEY", "BASIL", "BAYLEAF", "GREENBEAN", "LIMABEAN", "WHITEBEAN", "BEEFIGE", "REDBEET", "BLUEBERRY", "BROCCOLI", "CABBAGE", "MELON", "CARROT", "CASEIN", "CASHEWNUT", "CAULIFLOWER", "CELERY"      Note: Lab results reviewed.  PFSH  Drug: Rhonda Palmer  reports no history of drug use. Alcohol:  reports no history of alcohol use. Tobacco:  reports that she has never smoked. She has never used smokeless tobacco. Medical:  has a past medical history of (HFpEF) heart failure with preserved ejection fraction (HCC), Anxiety, Asthma, Bipolar 1 disorder (HCC),  Diabetes mellitus without complication (HCC), Edema leg (for twenty years per patient), Fibromyalgia, History of cardiac catheterization, Hypertension, Hypothyroidism, Morbid obesity (HCC), Ovarian cyst, Palpitations, and Schizo affective schizophrenia (HCC). Family: family history includes Heart failure in her mother.  Past Surgical History:  Procedure Laterality Date   ABDOMINAL HYSTERECTOMY     ANKLE SURGERY     pt. states long time ago   LAPAROTOMY N/A 01/05/2022   Procedure: EXPLORATORY LAPAROTOMY WITH REMOVALOF ABDOMINAL ABSCESS;  Surgeon: Sung Amabile, DO;  Location: [facility] ORS;  Service: General;  Laterality: N/A;   LEFT HEART CATH AND CORONARY ANGIOGRAPHY N/A 03/13/2020   Procedure: LEFT HEART CATH AND CORONARY ANGIOGRAPHY;  Surgeon: Yvonne Kendall, MD;  Location: [facility] INVASIVE CV LAB;  Service: Cardiovascular;  Laterality: N/A;   Active Ambulatory Problems    Diagnosis Date Noted   Traumatic arthropathy, unspecified ankle and foot 12/16/2007   Schizoaffective disorder, bipolar type (HCC) 02/20/2012   Personality disorder (HCC) 10/16/2012   Obesity 01/17/2016   Mixed hyperlipidemia 10/09/2015   Hypothyroidism 11/16/2002   Hypokalemia 10/09/2015   History of opioid abuse (HCC) 01/17/2016   Headache above the eye region 06/16/2013   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) 04/12/2013   GERD (gastroesophageal reflux disease) 04/11/2013   Localized edema 05/18/2013   Chronic pain syndrome 06/18/2012   Asthma 01/17/2016   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) 04/01/2010   Long term (current) use of opiate  analgesic 06/26/2016   Long term prescription opiate use 06/26/2016   Chest pain 03/11/2020   Bipolar 1 disorder (HCC)    Depression with anxiety 05/16/2020   Hypertension 05/16/2020   HLD (hyperlipidemia)    Diabetes mellitus type 2, uncomplicated (HCC) 08/13/2016   Morbid obesity with BMI of 60.0-69.9, adult (HCC)    Acute respiratory failure with hypoxia (HCC)    Normocytic anemia 03/11/2020   Dyspnea on exertion 05/16/2020   Unstable angina (HCC)    Acute on chronic respiratory failure with hypoxemia (HCC) 12/04/2020   Hypoxemia 12/05/2020   Acute metabolic encephalopathy 01/11/2021   Abdominal pain 01/11/2021   Obesity, Class III, BMI 40-49.9 (morbid obesity) (HCC) 01/11/2021   Prolonged QT interval 01/11/2021   Diarrhea 01/11/2021   Opioid withdrawal (HCC) 01/13/2021   Confusion    Weakness    Acute kidney injury (HCC) 01/01/2022   Acute gastroenteritis 01/01/2022   Acute kidney failure (HCC) 01/02/2022   Nausea and vomiting 01/04/2022   Obesity (BMI 35.0-39.9 without comorbidity) 09/05/2016   Pharmacologic therapy 11/20/2022   Disorder of skeletal system 11/20/2022   Problems influencing health status 11/20/2022   Abnormal MRI, lumbar spine (07/24/2022) 11/20/2022   History of gastric bypass 11/20/2022   Chronic low back pain (1ry area of Pain) (Bilateral) w/ sciatica (Right) 11/24/2022   Chronic lower extremity pain (2ry area of Pain) (Right) 11/24/2022   Tailbone injury, sequela 11/24/2022   Lumbosacral radiculopathy at L5 (Right) 11/24/2022   Lumbosacral radiculopathy at S1 (Right) 11/24/2022   Chronic radicular pain of lower extremity (Right) 11/24/2022   Protrusion of lumbar intervertebral disc (L4-5, L5-S1) 11/24/2022   Resolved Ambulatory Problems    Diagnosis Date Noted   S/P ankle fusion 05/21/2012   Opiate use 06/26/2016   Fall 03/27/2017   Abscess of abdominal cavity (HCC) 01/06/2022   Past Medical History:  Diagnosis Date   (HFpEF) heart failure  with preserved ejection fraction (HCC)    Anxiety    Diabetes mellitus without complication (HCC)    Edema leg for twenty  years per patient   Fibromyalgia    History of cardiac catheterization    Morbid obesity (HCC)    Ovarian cyst    Palpitations    Schizo affective schizophrenia (HCC)    Constitutional Exam  General appearance: Well nourished, well developed, and well hydrated. In no apparent acute distress Vitals:   11/24/22 0855  BP: 133/86  Pulse: 76  Resp: 16  Temp: (!) 97.4 F (36.3 C)  SpO2: 100%  Weight: 173 lb (78.5 kg)  Height: 5' (1.524 m)   BMI Assessment: Estimated body mass index is 33.79 kg/m as calculated from the following:   Height as of this encounter: 5' (1.524 m).   Weight as of this encounter: 173 lb (78.5 kg).  BMI interpretation table: BMI level Category Range association with higher incidence of chronic pain  <18 kg/m2 Underweight   18.5-24.9 kg/m2 Ideal body weight   25-29.9 kg/m2 Overweight Increased incidence by 20%  30-34.9 kg/m2 Obese (Class I) Increased incidence by 68%  35-39.9 kg/m2 Severe obesity (Class II) Increased incidence by 136%  >40 kg/m2 Extreme obesity (Class III) Increased incidence by 254%   Patient's current BMI Ideal Body weight  Body mass index is 33.79 kg/m. Ideal body weight: 45.5 kg (100 lb 4.9 oz) Adjusted ideal body weight: 58.7 kg (129 lb 6.2 oz)   BMI Readings from Last 4 Encounters:  11/24/22 33.79 kg/m  10/06/22 34.36 kg/m  08/15/22 34.37 kg/m  01/01/22 38.67 kg/m   Wt Readings from Last 4 Encounters:  11/24/22 173 lb (78.5 kg)  10/06/22 175 lb 14.8 oz (79.8 kg)  08/15/22 176 lb (79.8 kg)  01/01/22 198 lb (89.8 kg)    Psych/Mental status: Alert, oriented x 3 (person, place, & time)       Eyes: PERLA Respiratory: No evidence of acute respiratory distress  Assessment  Primary Diagnosis & Pertinent Problem List: The primary encounter diagnosis was Chronic pain syndrome. Diagnoses of Chronic low  back pain (1ry area of Pain) (Bilateral) w/ sciatica (Right), Chronic lower extremity pain (2ry area of Pain) (Right), Tailbone injury, sequela, Lumbosacral radiculopathy at L5 (Right), Lumbosacral radiculopathy at S1 (Right), Chronic radicular pain of lower extremity (Right), Protrusion of lumbar intervertebral disc (L4-5, L5-S1), Abnormal MRI, lumbar spine (07/24/2022), Pharmacologic therapy, Disorder of skeletal system, and Problems influencing health status were also pertinent to this visit.  Visit Diagnosis (New problems to examiner): 1. Chronic pain syndrome   2. Chronic low back pain (1ry area of Pain) (Bilateral) w/ sciatica (Right)   3. Chronic lower extremity pain (2ry area of Pain) (Right)   4. Tailbone injury, sequela   5. Lumbosacral radiculopathy at L5 (Right)   6. Lumbosacral radiculopathy at S1 (Right)   7. Chronic radicular pain of lower extremity (Right)   8. Protrusion of lumbar intervertebral disc (L4-5, L5-S1)   9. Abnormal MRI, lumbar spine (07/24/2022)   10. Pharmacologic therapy   11. Disorder of skeletal system   12. Problems influencing health status    Plan of Care (Initial workup plan)  Note: Rhonda Palmer was reminded that as per protocol, today's visit has been an evaluation only. We have not taken over the patient's controlled substance management.  Problem-specific plan: No problem-specific Assessment & Plan notes found for this encounter.  Lab Orders         Compliance Drug Analysis, Ur         Comp. Metabolic Panel (12)         Magnesium  Vitamin B12         Sedimentation rate         25-Hydroxy vitamin D Lcms D2+D3         C-reactive protein     Imaging Orders         DG Lumbar Spine Complete W/Bend         DG Sacrum/Coccyx     Referral Orders  No referral(s) requested today   Procedure Orders         Caudal Epidural Injection     Pharmacotherapy (current): Medications ordered:  No orders of the defined types were placed in this  encounter.  Medications administered during this visit: Shawndell L. Anson had no medications administered during this visit.   Analgesic Pharmacotherapy:  Opioid Analgesics: For patients currently taking or requesting to take opioid analgesics, in accordance with [facility] Guidelines, we will assess their risks and indications for the use of these substances. After completing our evaluation, we may offer recommendations, but we no longer take patients for medication management. The prescribing physician will ultimately decide, based on his/her training and level of comfort whether to adopt any of the recommendations, including whether or not to prescribe such medicines.  Membrane stabilizer: To be determined at a later time  Muscle relaxant: To be determined at a later time  NSAID: To be determined at a later time  Other analgesic(s): To be determined at a later time   Interventional management options: Rhonda Palmer was informed that there is no guarantee that she would be a candidate for interventional therapies. The decision will be based on the results of diagnostic studies, as well as Rhonda Palmer's risk profile.  Procedure(s) under consideration:  Pending results of ordered studies      Interventional Therapies  Risk Factors  Considerations  Medical Comorbidities:     Planned  Pending:   Diagnostic x-rays of the tailbone/sacrum. Diagnostic/therapeutic midline caudal ESI #1    Under consideration:   Diagnostic/therapeutic midline caudal ESI #1  Diagnostic/therapeutic right L4-5 interlaminar LESI #1  Diagnostic/therapeutic bilateral lumbar facet MBB #1    Completed:   None at this time   Therapeutic  Palliative (PRN) options:   None established   Completed by other providers:   Diagnostic/therapeutic right S1 TFESI x1 (10/10/2022) by Filomena Jungling, MD [facility]R)  Diagnostic/therapeutic right L5 TFESI x1 (09/05/2022) by Filomena Jungling, MD ([facility])        Provider-requested follow-up: Return for ( ), (ECT): (R) Caudal vs (R) L5-S1 LESI #1.  Future Appointments  Date Time Provider Department Center  01/02/2023 11:00 AM Debbe Odea, MD [facility] None    Duration of encounter: 46 minutes.  Total time on encounter, as per AMA guidelines included both the face-to-face and non-face-to-face time personally spent by the physician and/or other qualified health care professional(s) on the day of the encounter (includes time in activities that require the physician or other qualified health care professional and does not include time in activities normally performed by clinical staff). Physician's time may include the following activities when performed: Preparing to see the patient (e.g., pre-charting review of records, searching for previously ordered imaging, lab work, and nerve conduction tests) Review of prior analgesic pharmacotherapies. Reviewing PMP Interpreting ordered tests (e.g., lab work, imaging, nerve conduction tests) Performing post-procedure evaluations, including interpretation of diagnostic procedures Obtaining and/or reviewing separately obtained history Performing a medically appropriate examination and/or evaluation Counseling and educating the patient/family/caregiver Ordering medications, tests, or procedures Referring and  communicating with other health care professionals (when not separately reported) Documenting clinical information in the electronic or other health record Independently interpreting results (not separately reported) and communicating results to the patient/ family/caregiver Care coordination (not separately reported)  Note by: Oswaldo Done, MD (TTS technology used. I apologize for any typographical errors that were not detected and corrected.) Date: 11/24/2022; Time: 8:23 AM

## 2022-11-24 ENCOUNTER — Encounter: Payer: Self-pay | Admitting: Pain Medicine

## 2022-11-24 ENCOUNTER — Ambulatory Visit: Payer: MEDICAID | Attending: Pain Medicine | Admitting: Pain Medicine

## 2022-11-24 VITALS — BP 133/86 | HR 76 | Temp 97.4°F | Resp 16 | Ht 60.0 in | Wt 173.0 lb

## 2022-11-24 DIAGNOSIS — M5126 Other intervertebral disc displacement, lumbar region: Secondary | ICD-10-CM | POA: Insufficient documentation

## 2022-11-24 DIAGNOSIS — R937 Abnormal findings on diagnostic imaging of other parts of musculoskeletal system: Secondary | ICD-10-CM | POA: Insufficient documentation

## 2022-11-24 DIAGNOSIS — G8929 Other chronic pain: Secondary | ICD-10-CM | POA: Diagnosis present

## 2022-11-24 DIAGNOSIS — M5441 Lumbago with sciatica, right side: Secondary | ICD-10-CM | POA: Insufficient documentation

## 2022-11-24 DIAGNOSIS — M5417 Radiculopathy, lumbosacral region: Secondary | ICD-10-CM | POA: Diagnosis present

## 2022-11-24 DIAGNOSIS — M79604 Pain in right leg: Secondary | ICD-10-CM | POA: Insufficient documentation

## 2022-11-24 DIAGNOSIS — M899 Disorder of bone, unspecified: Secondary | ICD-10-CM | POA: Diagnosis present

## 2022-11-24 DIAGNOSIS — M5410 Radiculopathy, site unspecified: Secondary | ICD-10-CM | POA: Diagnosis present

## 2022-11-24 DIAGNOSIS — Z789 Other specified health status: Secondary | ICD-10-CM | POA: Diagnosis present

## 2022-11-24 DIAGNOSIS — Z79899 Other long term (current) drug therapy: Secondary | ICD-10-CM | POA: Insufficient documentation

## 2022-11-24 DIAGNOSIS — G894 Chronic pain syndrome: Secondary | ICD-10-CM | POA: Diagnosis not present

## 2022-11-24 DIAGNOSIS — Z9884 Bariatric surgery status: Secondary | ICD-10-CM

## 2022-11-24 DIAGNOSIS — S3992XS Unspecified injury of lower back, sequela: Secondary | ICD-10-CM | POA: Insufficient documentation

## 2022-11-24 NOTE — Progress Notes (Unsigned)
Safety precautions to be maintained throughout the outpatient stay will include: orient to surroundings, keep bed in low position, maintain call bell within reach at all times, provide assistance with transfer out of bed and ambulation.  

## 2022-11-27 LAB — COMPLIANCE DRUG ANALYSIS, UR

## 2022-12-02 ENCOUNTER — Encounter: Payer: Self-pay | Admitting: Internal Medicine

## 2022-12-04 LAB — COMP. METABOLIC PANEL (12)
AST: 20 [IU]/L (ref 0–40)
Albumin: 4 g/dL (ref 3.8–4.9)
Alkaline Phosphatase: 135 [IU]/L — ABNORMAL HIGH (ref 44–121)
BUN/Creatinine Ratio: 14 (ref 9–23)
BUN: 10 mg/dL (ref 6–24)
Bilirubin Total: 0.4 mg/dL (ref 0.0–1.2)
Calcium: 9.7 mg/dL (ref 8.7–10.2)
Chloride: 103 mmol/L (ref 96–106)
Creatinine, Ser: 0.73 mg/dL (ref 0.57–1.00)
Globulin, Total: 2.6 g/dL (ref 1.5–4.5)
Glucose: 100 mg/dL — ABNORMAL HIGH (ref 70–99)
Potassium: 4.4 mmol/L (ref 3.5–5.2)
Sodium: 141 mmol/L (ref 134–144)
Total Protein: 6.6 g/dL (ref 6.0–8.5)
eGFR: 98 mL/min/{1.73_m2} (ref >59)

## 2022-12-04 LAB — 25-HYDROXY VITAMIN D LCMS D2+D3
25-Hydroxy, Vitamin D-2: <1 ng/mL
25-Hydroxy, Vitamin D-3: 45 ng/mL
25-Hydroxy, Vitamin D: 45 ng/mL

## 2022-12-04 LAB — VITAMIN B12: Vitamin B-12: 675 pg/mL (ref 232–1245)

## 2022-12-04 LAB — SEDIMENTATION RATE: Sed Rate: 7 mm/h (ref 0–40)

## 2022-12-04 LAB — C-REACTIVE PROTEIN: CRP: <1 mg/L (ref 0–10)

## 2022-12-04 LAB — MAGNESIUM: Magnesium: 2.1 mg/dL (ref 1.6–2.3)

## 2022-12-08 ENCOUNTER — Emergency Department: Payer: MEDICAID

## 2022-12-08 ENCOUNTER — Emergency Department
Admission: EM | Admit: 2022-12-08 | Discharge: 2022-12-08 | Disposition: A | Discharge status: Home / Self Care | Payer: MEDICAID | Attending: Emergency Medicine | Admitting: Emergency Medicine

## 2022-12-08 ENCOUNTER — Other Ambulatory Visit: Payer: Self-pay

## 2022-12-08 DIAGNOSIS — I110 Hypertensive heart disease with heart failure: Secondary | ICD-10-CM | POA: Diagnosis not present

## 2022-12-08 DIAGNOSIS — E1165 Type 2 diabetes mellitus with hyperglycemia: Secondary | ICD-10-CM | POA: Diagnosis not present

## 2022-12-08 DIAGNOSIS — R079 Chest pain, unspecified: Secondary | ICD-10-CM

## 2022-12-08 DIAGNOSIS — I509 Heart failure, unspecified: Secondary | ICD-10-CM | POA: Insufficient documentation

## 2022-12-08 DIAGNOSIS — R0789 Other chest pain: Secondary | ICD-10-CM | POA: Insufficient documentation

## 2022-12-08 LAB — CBC
HCT: 39 % (ref 36.0–46.0)
Hemoglobin: 12.1 g/dL (ref 12.0–15.0)
MCH: 28.8 pg (ref 26.0–34.0)
MCHC: 31 g/dL (ref 30.0–36.0)
MCV: 92.9 fL (ref 80.0–100.0)
Platelets: 249 10*3/uL (ref 150–400)
RBC: 4.2 MIL/uL (ref 3.87–5.11)
RDW: 14.1 % (ref 11.5–15.5)
WBC: 8.3 10*3/uL (ref 4.0–10.5)
nRBC: 0 % (ref 0.0–0.2)

## 2022-12-08 LAB — TROPONIN I (HIGH SENSITIVITY)
Troponin I (High Sensitivity): 3 ng/L (ref <18)
Troponin I (High Sensitivity): 3 ng/L (ref <18)

## 2022-12-08 LAB — BASIC METABOLIC PANEL
Anion gap: 5 (ref 5–15)
BUN: 16 mg/dL (ref 6–20)
CO2: 31 mmol/L (ref 22–32)
Calcium: 9.1 mg/dL (ref 8.9–10.3)
Chloride: 107 mmol/L (ref 98–111)
Creatinine, Ser: 0.87 mg/dL (ref 0.44–1.00)
GFR, Estimated: >60 mL/min (ref >60)
Glucose, Bld: 56 mg/dL — ABNORMAL LOW (ref 70–99)
Potassium: 3 mmol/L — ABNORMAL LOW (ref 3.5–5.1)
Sodium: 143 mmol/L (ref 135–145)

## 2022-12-08 LAB — BRAIN NATRIURETIC PEPTIDE: B Natriuretic Peptide: 26.1 pg/mL (ref 0.0–100.0)

## 2022-12-08 LAB — CBG MONITORING, ED: Glucose-Capillary: 117 mg/dL — ABNORMAL HIGH (ref 70–99)

## 2022-12-08 MED ORDER — MORPHINE SULFATE (PF) 4 MG/ML IV SOLN
4.0000 mg | Freq: Once | INTRAVENOUS | Status: AC
  Administered 2022-12-08: 4 mg via INTRAVENOUS
  Filled 2022-12-08: qty 1

## 2022-12-08 MED ORDER — OXYCODONE-ACETAMINOPHEN 5-325 MG PO TABS
1.0000 | ORAL_TABLET | Freq: Once | ORAL | Status: AC
  Administered 2022-12-08: 1 via ORAL
  Filled 2022-12-08: qty 1

## 2022-12-08 MED ORDER — KETOROLAC TROMETHAMINE 15 MG/ML IJ SOLN
15.0000 mg | Freq: Once | INTRAMUSCULAR | Status: AC
  Administered 2022-12-08: 15 mg via INTRAVENOUS
  Filled 2022-12-08: qty 1

## 2022-12-08 MED ORDER — LIDOCAINE 5 % EX PTCH
1.0000 | MEDICATED_PATCH | CUTANEOUS | Status: DC
  Administered 2022-12-08: 1 via TRANSDERMAL
  Filled 2022-12-08: qty 1

## 2022-12-08 MED ORDER — IOHEXOL 350 MG/ML SOLN: 50.0000 mL | Freq: Once | INTRAVENOUS | Status: DC | PRN

## 2022-12-08 MED ORDER — IOHEXOL 350 MG/ML SOLN
75.0000 mL | Freq: Once | INTRAVENOUS | Status: AC | PRN
  Administered 2022-12-08: 75 mL via INTRAVENOUS

## 2022-12-08 NOTE — ED Triage Notes (Signed)
Pt comes via EMs with c/o cp that started last night. Pt states pressure and heaviness. Pt states pain to take in deep breath. Pt states sharp pain in left breast.   Pt also have bilateral swelling in ankles. Pt is on lasix pill.

## 2022-12-08 NOTE — ED Notes (Addendum)
Pt reports continued chest pain- no different than when triaged. VS reassessed. NAD noted.

## 2022-12-08 NOTE — ED Provider Notes (Signed)
Care assumed of patient from outgoing provider.  See their note for initial history, exam and plan.  Clinical Course as of 12/08/22 1503  Mon Dec 08, 2022  1503 Pleuritic chest pain and CTA pending. EKG reassuring. Delta trop pending. If negative can dc home.  [SM]    Clinical Course User Index [SM] Corena Herter, MD     Corena Herter, MD 12/08/22 1504

## 2022-12-08 NOTE — Discharge Instructions (Signed)
You are seen in the emergency department for chest pain.  You had 2 heart enzymes that were normal.  No concern for heart attack today.  You had a heart catheterization 2 years ago that did not show any findings of coronary artery disease.  You had a CT scan of your chest that did not show any signs of blood clots.  Take your pain medication as prescribed.  Return to the emergency department for any ongoing or worsening pain.

## 2022-12-08 NOTE — ED Notes (Signed)
Pt ambulated from room into hall and back into room. During ambulation pt reported 7/10 central chest pain and feeling short of breath - SpO2 ranged from 90-100%. Pt reports hesitant to go home due to dyspnea with ambulation.

## 2022-12-08 NOTE — ED Provider Notes (Signed)
[facility] Provider Note    Event Date/Time   First MD Initiated Contact with Patient 12/08/22 1222     (approximate)   History   Chief Complaint Chest Pain   HPI  Rhonda Palmer is a 52 y.o. female with past medical history of hypertension, diabetes, DVT, CHF, schizoaffective disorder, and chronic pain syndrome who presents to the ED complaining of chest pain.  Patient reports that she was woken from sleep with sharp pain in the left side of her chest around 1:00 this morning.  Pain is worse with a deep breath and she reports intermittent difficulty breathing, but has not had any fevers or cough.  She does report both of her legs have been swollen with some cramping discomfort in her left leg.  She has a history of DVT but is not currently on a blood thinner.     Physical Exam   Triage Vital Signs: ED Triage Vitals [12/08/22 1033]  Encounter Vitals Group     BP 104/79     Systolic BP Percentile      Diastolic BP Percentile      Pulse Rate 73     Resp 18     Temp 98 F (36.7 C)     Temp src      SpO2 100 %     Weight 170 lb (77.1 kg)     Height 5' (1.524 m)     Head Circumference      Peak Flow      Pain Score 9     Pain Loc      Pain Education      Exclude from Growth Chart     Most recent vital signs: Vitals:   12/08/22 1131 12/08/22 1300  BP: 99/80 95/78  Pulse: 67 65  Resp: 16 14  Temp:    SpO2: 100% 98%    Constitutional: Alert and oriented. Eyes: Conjunctivae are normal. Head: Atraumatic. Nose: No congestion/rhinnorhea. Mouth/Throat: Mucous membranes are moist.  Cardiovascular: Normal rate, regular rhythm. Grossly normal heart sounds.  2+ radial pulses bilaterally. Respiratory: Normal respiratory effort.  No retractions. Lungs CTAB.  Left chest wall tenderness to palpation noted. Gastrointestinal: Soft and nontender. No distention. Musculoskeletal: 1+ pitting edema to knees bilaterally, no calf tenderness  noted. Neurologic:  Normal speech and language. No gross focal neurologic deficits are appreciated.    ED Results / Procedures / Treatments   Labs (all labs ordered are listed, but only abnormal results are displayed) Labs Reviewed  BASIC METABOLIC PANEL - Abnormal; Notable for the following components:      Result Value   Potassium 3.0 (*)    Glucose, Bld 56 (*)    All other components within normal limits  CBC  BRAIN NATRIURETIC PEPTIDE  TROPONIN I (HIGH SENSITIVITY)  TROPONIN I (HIGH SENSITIVITY)     EKG  ED ECG REPORT I, Chesley Noon, the attending physician, personally viewed and interpreted this ECG.   Date: 12/08/2022  EKG Time: 10:36  Rate: 73  Rhythm: normal sinus rhythm  Axis: Normal  Intervals:none  ST&T Change: None  RADIOLOGY Chest x-ray reviewed and interpreted by me with no infiltrate, edema, or effusion.  PROCEDURES:  Critical Care performed: No  Procedures   MEDICATIONS ORDERED IN ED: Medications  lidocaine (LIDODERM) 5 % 1 patch (1 patch Transdermal Patch Applied 12/08/22 1336)  iohexol (OMNIPAQUE) 350 MG/ML injection 50 mL (has no administration in time range)  oxyCODONE-acetaminophen (PERCOCET/ROXICET) 5-325 MG per tablet  1 tablet (1 tablet Oral Given 12/08/22 1336)     IMPRESSION / MDM / ASSESSMENT AND PLAN / ED COURSE  I reviewed the triage vital signs and the nursing notes.                              52 y.o. female with past medical history of hypertension, diabetes, DVT, CHF, schizoaffective disorder, and chronic pain syndrome who presents to the ED complaining of sharp pleuritic chest pain since waking up this morning at 1 AM.  Patient's presentation is most consistent with acute presentation with potential threat to life or bodily function.  Differential diagnosis includes, but is not limited to, ACS, PE, pneumonia, pneumothorax, musculoskeletal pain, GERD, anxiety.  Patient well-appearing and in no acute distress, vital  signs are unremarkable.  EKG shows no evidence of arrhythmia or ischemia and initial troponin within normal limits.  Low suspicion for ACS given her atypical symptoms, but she does have a history of DVT and with pleuritic chest pain we will check CTA of her chest.  Chest x-ray is unremarkable and remainder of labs are reassuring with no significant anemia, leukocytosis, electrolyte abnormality, or AKI.  Patient turned over to oncoming rider pending CTA chest and repeat troponin.  If these are unremarkable then suspect musculoskeletal etiology of her chest pain.   FINAL CLINICAL IMPRESSION(S) / ED DIAGNOSES   Final diagnoses:  Nonspecific chest pain     Rx / DC Orders   ED Discharge Orders     None        Note:  This document was prepared using Dragon voice recognition software and may include unintentional dictation errors.   Chesley Noon, MD 12/08/22 [phone]

## 2022-12-08 NOTE — ED Notes (Signed)
See triage notes. Patient c/o chest pain/pressure that started last night.

## 2022-12-09 ENCOUNTER — Other Ambulatory Visit: Payer: Self-pay

## 2022-12-09 ENCOUNTER — Telehealth: Payer: Self-pay | Admitting: *Deleted

## 2022-12-09 NOTE — Telephone Encounter (Signed)
Pt called regarding which pharmacy Rx was e-scribed to.  RNCM reviewed chart to access After Visit Summary and found that Rx was not written.  RNCM returned a call to speak with pt regarding this matter and received voicemail in which the mailbox is full.

## 2022-12-11 ENCOUNTER — Ambulatory Visit: Payer: MEDICAID | Admitting: Pain Medicine

## 2022-12-12 ENCOUNTER — Emergency Department: Payer: MEDICAID

## 2022-12-12 ENCOUNTER — Other Ambulatory Visit: Payer: Self-pay

## 2022-12-12 ENCOUNTER — Emergency Department
Admission: EM | Admit: 2022-12-12 | Discharge: 2022-12-12 | Disposition: A | Discharge status: Home / Self Care | Payer: MEDICAID | Attending: Emergency Medicine | Admitting: Emergency Medicine

## 2022-12-12 DIAGNOSIS — R079 Chest pain, unspecified: Secondary | ICD-10-CM | POA: Diagnosis present

## 2022-12-12 DIAGNOSIS — R0602 Shortness of breath: Secondary | ICD-10-CM | POA: Diagnosis not present

## 2022-12-12 DIAGNOSIS — E119 Type 2 diabetes mellitus without complications: Secondary | ICD-10-CM | POA: Diagnosis not present

## 2022-12-12 DIAGNOSIS — I10 Essential (primary) hypertension: Secondary | ICD-10-CM | POA: Diagnosis not present

## 2022-12-12 LAB — CBC
HCT: 40.3 % (ref 36.0–46.0)
Hemoglobin: 12.3 g/dL (ref 12.0–15.0)
MCH: 28.1 pg (ref 26.0–34.0)
MCHC: 30.5 g/dL (ref 30.0–36.0)
MCV: 92 fL (ref 80.0–100.0)
Platelets: 270 10*3/uL (ref 150–400)
RBC: 4.38 MIL/uL (ref 3.87–5.11)
RDW: 14 % (ref 11.5–15.5)
WBC: 9.2 10*3/uL (ref 4.0–10.5)
nRBC: 0 % (ref 0.0–0.2)

## 2022-12-12 LAB — BASIC METABOLIC PANEL
Anion gap: 10 (ref 5–15)
BUN: 12 mg/dL (ref 6–20)
CO2: 28 mmol/L (ref 22–32)
Calcium: 9.1 mg/dL (ref 8.9–10.3)
Chloride: 101 mmol/L (ref 98–111)
Creatinine, Ser: 0.8 mg/dL (ref 0.44–1.00)
GFR, Estimated: >60 mL/min (ref >60)
Glucose, Bld: 94 mg/dL (ref 70–99)
Potassium: 4.2 mmol/L (ref 3.5–5.1)
Sodium: 139 mmol/L (ref 135–145)

## 2022-12-12 LAB — TROPONIN I (HIGH SENSITIVITY)
Troponin I (High Sensitivity): 3 ng/L (ref <18)
Troponin I (High Sensitivity): 3 ng/L (ref <18)

## 2022-12-12 LAB — BRAIN NATRIURETIC PEPTIDE: B Natriuretic Peptide: 23.5 pg/mL (ref 0.0–100.0)

## 2022-12-12 MED ORDER — OXYCODONE-ACETAMINOPHEN 5-325 MG PO TABS
2.0000 | ORAL_TABLET | Freq: Once | ORAL | Status: AC
  Administered 2022-12-12: 2 via ORAL
  Filled 2022-12-12: qty 2

## 2022-12-12 NOTE — ED Triage Notes (Addendum)
Pt here via EMS for intermittent left sided CP x1 week. Pt reports pain is worse w/ palpation and prevents her from taking a deep breath. Pt here for same 3 days ago. Pt reports mild nausea and dizziness w/ standing up. Pt AOX4, NAD noted. 61 134/90

## 2022-12-12 NOTE — ED Provider Notes (Addendum)
[facility] Provider Note    Event Date/Time   First MD Initiated Contact with Patient 12/12/22 1408     (approximate)  History   Chief Complaint: Chest Pain  HPI  Anijah Forren is a 52 y.o. female with a past medical history anxiety, bipolar, diabetes, fibromyalgia, hypertension, palpitations, schizophrenia, presents to the emergency department for chest pain.  According to the patient for the past 2 weeks or so she has been experiencing pain in the center of her chest.  As well as some mild shortness of breath.  No pleuritic pain.  No fever.  No significant cough or congestion.  Physical Exam   Triage Vital Signs: ED Triage Vitals  Encounter Vitals Group     BP 12/12/22 1256 (!) 107/90     Systolic BP Percentile --      Diastolic BP Percentile --      Pulse Rate 12/12/22 1256 60     Resp 12/12/22 1256 16     Temp 12/12/22 1256 (!) 97.5 F (36.4 C)     Temp Source 12/12/22 1256 Oral     SpO2 12/12/22 1256 100 %     Weight --      Height --      Head Circumference --      Peak Flow --      Pain Score 12/12/22 1257 10     Pain Loc --      Pain Education --      Exclude from Growth Chart --     Most recent vital signs: Vitals:   12/12/22 1256  BP: (!) 107/90  Pulse: 60  Resp: 16  Temp: (!) 97.5 F (36.4 C)  SpO2: 100%    General: Awake, no distress.  CV:  Good peripheral perfusion.  Regular rate and rhythm  Resp:  Normal effort.  Equal breath sounds bilaterally.  Abd:  No distention.  Soft, nontender.  No rebound or guarding. Other:  Minimal lower extremity edema.   ED Results / Procedures / Treatments   EKG  EKG viewed and interpreted by myself shows a normal sinus rhythm at 60 bpm the narrow QRS, normal axis, normal intervals, no concerning ST changes.  RADIOLOGY  I have reviewed and interpreted chest x-ray images.  No consolidation or edema seen on my evaluation.   MEDICATIONS ORDERED IN ED: Medications   oxyCODONE-acetaminophen (PERCOCET/ROXICET) 5-325 MG per tablet 2 tablet (has no administration in time range)     IMPRESSION / MDM / ASSESSMENT AND PLAN / ED COURSE  I reviewed the triage vital signs and the nursing notes.  Patient's presentation is most consistent with acute presentation with potential threat to life or bodily function.  Patient presents emergency department for chest pain ongoing over the past 2 weeks.  Overall the patient appears well, no distress.  Workup so far reassuring with a reassuring chest x-ray and EKG, lab work shows a normal CBC normal chemistry negative troponin.  Patient satting 100% on room air with a normal respiratory and pulse rate.  We will repeat a troponin as a precaution and obtain a BNP.  I reviewed the patient's chart she was seen here 12/08/2022 had a significant chest pain workup at that time including a CTA of the chest that showed no acute finding.  Patient states she has no current cardiologist.  If the patient's repeat troponin remains negative anticipate likely discharge home with cardiology follow-up for further evaluation and consideration of a possible stress  test.  Patient agreeable to plan of care.  Patient's BNP and repeat troponin remain negative.  Given the patient's reassuring workup we will discharge home with PCP follow-up.  I have given additional cardiology information for the patient.  Upon attempting discharge patient is very upset saying that we are not controlling her pain adequately and she requests more pain medication.  I discussed with the patient that given her reassuring workup I could not write her for pain medication.  Patient continues to state that she needs more pain medication either while she is in the emergency department or to be discharged home with.  I again stated given her reassuring workup I believe she would need to follow-up with her primary care doctor or cardiologist if she needs ongoing pain medication.   Patient is very upset regarding this.  FINAL CLINICAL IMPRESSION(S) / ED DIAGNOSES   Chest pain   Note:  This document was prepared using Dragon voice recognition software and may include unintentional dictation errors.   Minna Antis, MD 12/12/22 1712    Minna Antis, MD 12/12/22 1725

## 2022-12-12 NOTE — ED Provider Triage Note (Signed)
Emergency Medicine Provider Triage Evaluation Note  Rhonda Palmer , a 52 y.o. female  was evaluated in triage.  Pt complains of chest pain for 7 days. Pain is under the left breast. Was seen on 11/11 with negative CTA and work up. Pain in her chest has not improved since she was seen. She says she was not given anything for pain at her discharge. She has been taking tylenol and ibuprofen for pain.  Review of Systems  Positive: Chest pain, SOB, nausea, vomiting, dizziness Negative: fevers  Physical Exam  There were no vitals taken for this visit. Gen:   Awake, no distress   Resp:  Normal effort  MSK:   Moves extremities without difficulty  Other:    Medical Decision Making  Medically screening exam initiated at 12:51 PM.  Appropriate orders placed.  Rhonda Palmer was informed that the remainder of the evaluation will be completed by another provider, this initial triage assessment does not replace that evaluation, and the importance of remaining in the ED until their evaluation is complete.    Cameron Ali, PA-C 12/12/22 1259

## 2022-12-13 ENCOUNTER — Other Ambulatory Visit: Payer: Self-pay

## 2022-12-13 ENCOUNTER — Emergency Department
Admission: EM | Admit: 2022-12-13 | Discharge: 2022-12-13 | Disposition: A | Discharge status: Home / Self Care | Payer: MEDICAID | Attending: Student in an Organized Health Care Education/Training Program | Admitting: Student in an Organized Health Care Education/Training Program

## 2022-12-13 ENCOUNTER — Emergency Department: Payer: MEDICAID

## 2022-12-13 DIAGNOSIS — J45909 Unspecified asthma, uncomplicated: Secondary | ICD-10-CM | POA: Diagnosis not present

## 2022-12-13 DIAGNOSIS — R079 Chest pain, unspecified: Secondary | ICD-10-CM | POA: Insufficient documentation

## 2022-12-13 LAB — CBC
HCT: 42.8 % (ref 36.0–46.0)
Hemoglobin: 13.1 g/dL (ref 12.0–15.0)
MCH: 28.2 pg (ref 26.0–34.0)
MCHC: 30.6 g/dL (ref 30.0–36.0)
MCV: 92 fL (ref 80.0–100.0)
Platelets: 265 10*3/uL (ref 150–400)
RBC: 4.65 MIL/uL (ref 3.87–5.11)
RDW: 14 % (ref 11.5–15.5)
WBC: 7.6 10*3/uL (ref 4.0–10.5)
nRBC: 0 % (ref 0.0–0.2)

## 2022-12-13 LAB — BASIC METABOLIC PANEL
Anion gap: 9 (ref 5–15)
BUN: 11 mg/dL (ref 6–20)
CO2: 31 mmol/L (ref 22–32)
Calcium: 9.3 mg/dL (ref 8.9–10.3)
Chloride: 101 mmol/L (ref 98–111)
Creatinine, Ser: 0.84 mg/dL (ref 0.44–1.00)
GFR, Estimated: >60 mL/min (ref >60)
Glucose, Bld: 121 mg/dL — ABNORMAL HIGH (ref 70–99)
Potassium: 3.7 mmol/L (ref 3.5–5.1)
Sodium: 141 mmol/L (ref 135–145)

## 2022-12-13 LAB — TROPONIN I (HIGH SENSITIVITY): Troponin I (High Sensitivity): 3 ng/L (ref <18)

## 2022-12-13 MED ORDER — CLONAZEPAM 0.5 MG PO TABS
0.5000 mg | ORAL_TABLET | Freq: Every day | ORAL | Status: DC
  Administered 2022-12-13: 0.5 mg via ORAL
  Filled 2022-12-13: qty 1

## 2022-12-13 MED ORDER — ACETAMINOPHEN 325 MG PO TABS
650.0000 mg | ORAL_TABLET | Freq: Once | ORAL | Status: AC
  Administered 2022-12-13: 650 mg via ORAL
  Filled 2022-12-13: qty 2

## 2022-12-13 NOTE — ED Triage Notes (Signed)
First nurse note: pt to ED [facility] from home for shob. 100% on RA. C/o chest pain with palpation, seen yesterday for same.

## 2022-12-13 NOTE — ED Triage Notes (Signed)
Pt to ED via [facility] from home. Pt reports CP, palpitations and SOB. Pt was seen yesterday for same. Pt states oxygen at home was 36%. EMS states oxygen 95-100%. Pt oxygen 100% in triage.

## 2022-12-13 NOTE — ED Provider Notes (Signed)
[facility] Provider Note    Event Date/Time   First MD Initiated Contact with Patient 12/13/22 1158     (approximate)   History   Shortness of Breath   HPI  Rhonda Palmer is a 52 y.o. female 3 of asthma, anxiety, bipolar, fibromyalgia, chronic pain syndrome presents to the ER for evaluation of persistent chest pain.  No pleuritic discomfort.  Symptoms same as yesterday.  Patient has been seen multiple times in the ER over the past few days with discomfort with reassuring workup including negative CTA and multiple enzymes negative.  Patient does admit to feeling anxious and scared.     Physical Exam   Triage Vital Signs: ED Triage Vitals [12/13/22 1142]  Encounter Vitals Group     BP (!) 125/97     Systolic BP Percentile      Diastolic BP Percentile      Pulse Rate 67     Resp 20     Temp 98 F (36.7 C)     Temp Source Oral     SpO2 100 %     Weight 169 lb 12.1 oz (77 kg)     Height 5' (1.524 m)     Head Circumference      Peak Flow      Pain Score 8     Pain Loc      Pain Education      Exclude from Growth Chart     Most recent vital signs: Vitals:   12/13/22 1142  BP: (!) 125/97  Pulse: 67  Resp: 20  Temp: 98 F (36.7 C)  SpO2: 100%     Constitutional: Alert  Eyes: Conjunctivae are normal.  Head: Atraumatic. Nose: No congestion/rhinnorhea. Mouth/Throat: Mucous membranes are moist.   Neck: Painless ROM.  Cardiovascular:   Good peripheral circulation. Respiratory: Normal respiratory effort.  No retractions.  Gastrointestinal: Soft and nontender.  Musculoskeletal:  no deformity Neurologic:  MAE spontaneously. No gross focal neurologic deficits are appreciated.  Skin:  Skin is warm, dry and intact. No rash noted. Psychiatric: Mood and affect are anxious.    ED Results / Procedures / Treatments   Labs (all labs ordered are listed, but only abnormal results are displayed) Labs Reviewed  BASIC METABOLIC PANEL -  Abnormal; Notable for the following components:      Result Value   Glucose, Bld 121 (*)    All other components within normal limits  CBC  TROPONIN I (HIGH SENSITIVITY)     EKG  ED ECG REPORT I, Willy Eddy, the attending physician, personally viewed and interpreted this ECG.   Date: 12/13/2022  EKG Time: 11:46  Rate: 65  Rhythm: sinus  Axis: normal  Intervals: normal  ST&T Change: no stemi, no depressions    RADIOLOGY Please see ED Course for my review and interpretation.  I personally reviewed all radiographic images ordered to evaluate for the above acute complaints and reviewed radiology reports and findings.  These findings were personally discussed with the patient.  Please see medical record for radiology report.    PROCEDURES:  Critical Care performed: No  Procedures   MEDICATIONS ORDERED IN ED: Medications  clonazePAM (KLONOPIN) tablet 0.5 mg (has no administration in time range)  acetaminophen (TYLENOL) tablet 650 mg (has no administration in time range)     IMPRESSION / MDM / ASSESSMENT AND PLAN / ED COURSE  I reviewed the triage vital signs and the nursing notes.  Differential diagnosis includes, but is not limited to, ACS, pericarditis, esophagitis, boerhaaves, pe, dissection, pna, bronchitis, costochondritis, anxiety, drug seeking behavior  Patient presenting to the ER for evaluation of symptoms as described above.  Based on symptoms, risk factors and considered above differential, this presenting complaint could reflect a potentially life-threatening illness therefore the patient will be placed on continuous pulse oximetry and telemetry for monitoring.  Laboratory evaluation will be sent to evaluate for the above complaints.  Chest x-ray on my review and interpretation with no pneumothorax consolidation or edema.  Patient has had extensive workup for similar symptoms over the past week all of which has been  reassuring.  Do suspect large component of anxiety.  Will give Klonopin.  I do not feel that repeat CT imaging or further diagnostic testing clinically indicated.  She does appear stable and appropriate for outpatient follow-up.       FINAL CLINICAL IMPRESSION(S) / ED DIAGNOSES   Final diagnoses:  Chest pain, unspecified type     Rx / DC Orders   ED Discharge Orders     None        Note:  This document was prepared using Dragon voice recognition software and may include unintentional dictation errors.    Willy Eddy, MD 12/13/22 1218

## 2022-12-16 ENCOUNTER — Ambulatory Visit: Payer: MEDICAID | Admitting: Pain Medicine

## 2022-12-16 ENCOUNTER — Encounter: Payer: Self-pay | Admitting: Internal Medicine

## 2022-12-17 ENCOUNTER — Ambulatory Visit: Admission: RE | Admit: 2022-12-17 | Payer: MEDICAID | Source: Home / Self Care | Admitting: Internal Medicine

## 2022-12-17 SURGERY — COLONOSCOPY WITH PROPOFOL
Anesthesia: General

## 2022-12-17 NOTE — H&P (Signed)
  Outpatient short stay form Pre-procedure 12/17/2022 11:25 AM Deandrae Wajda K. Norma Fredrickson, M.D.  Primary Physician:   Reason for visit:  colon cancer screening  History of present illness:  Patent canceled her procedure today.    No current facility-administered medications for this encounter.  Current Outpatient Medications:    ARISTADA 1064 MG/3.9ML prefilled syringe, Inject into the muscle., Disp: , Rfl:    clonazePAM (KLONOPIN) 0.5 MG tablet, Take by mouth., Disp: , Rfl:    gabapentin (NEURONTIN) 300 MG capsule, Take 300 mg by mouth 3 (three) times daily., Disp: , Rfl:    lamoTRIgine (LAMICTAL) 150 MG tablet, Take 150 mg by mouth daily., Disp: , Rfl:    levothyroxine (SYNTHROID) 125 MCG tablet, Take 2 tablets (250 mcg total) by mouth daily at 6 (six) AM., Disp: 180 tablet, Rfl: 0   lisinopril (ZESTRIL) 20 MG tablet, Take 1 tablet (20 mg total) by mouth daily., Disp: , Rfl:    ondansetron (ZOFRAN-ODT) 4 MG disintegrating tablet, Take 4 mg by mouth 3 (three) times daily as needed., Disp: , Rfl:    OZEMPIC, 1 MG/DOSE, 4 MG/3ML SOPN, Inject 1 mg into the skin once a week., Disp: , Rfl:    QUEtiapine (SEROQUEL) 200 MG tablet, Take 400 mg by mouth at bedtime., Disp: , Rfl:    torsemide (DEMADEX) 20 MG tablet, Take 1 tablet (20 mg total) by mouth daily., Disp: , Rfl:    traMADol (ULTRAM) 50 MG tablet, Take 1 tablet by mouth every 6 (six) hours as needed., Disp: , Rfl:   No medications prior to admission.     Allergies  Allergen Reactions   Codeine Hives, Itching, Swelling and Rash    Takes oxycodone, morphine    Sulfa Antibiotics Anaphylaxis   Penicillins Itching    TOLERATED CEFAZOLIN   Aspirin Rash     Past Medical History:  Diagnosis Date   (HFpEF) heart failure with preserved ejection fraction (HCC)    a. 02/2020 Echo: EF 55-60%, no rwma. Nl RV fxn.   Anxiety    Asthma    Bipolar 1 disorder (HCC)    Diabetes mellitus without complication (HCC)    Edema leg for twenty years per  patient   taking lasix for edema   Fibromyalgia    History of cardiac catheterization    a. 02/2020 Cath: Nl cors. LVEDP -> torsemide increased to 40mg  daily.   Hypertension    Hypothyroidism    Morbid obesity (HCC)    Ovarian cyst    Palpitations    a. 03/2020 Zio: RSR, avg HR 84 (55-203), occas SVT runs. No significant arrhythmias.  Most triggered events associate with sinus rhythm.   Schizo affective schizophrenia (HCC)     Review of systems:  Otherwise negative.    Physical Exam  Gen: Alert, oriented. Appears stated age.  HEENT: /AT. PERRLA. Lungs: CTA, no wheezes. CV: RR nl S1, S2. Abd: soft, benign, no masses. BS+ Ext: No edema. Pulses 2+    Planned procedures: Proceed with colonoscopy after outpatient rescheduling.    Atira Borello K. Norma Fredrickson, M.D. Gastroenterology 12/17/2022  11:25 AM

## 2022-12-23 ENCOUNTER — Ambulatory Visit (HOSPITAL_BASED_OUTPATIENT_CLINIC_OR_DEPARTMENT_OTHER): Payer: MEDICAID | Admitting: Pain Medicine

## 2022-12-23 DIAGNOSIS — G8929 Other chronic pain: Secondary | ICD-10-CM

## 2022-12-23 DIAGNOSIS — Z91199 Patient's noncompliance with other medical treatment and regimen due to unspecified reason: Secondary | ICD-10-CM

## 2022-12-23 DIAGNOSIS — Z5189 Encounter for other specified aftercare: Secondary | ICD-10-CM

## 2022-12-23 DIAGNOSIS — M5126 Other intervertebral disc displacement, lumbar region: Secondary | ICD-10-CM | POA: Insufficient documentation

## 2022-12-23 NOTE — Patient Instructions (Signed)

## 2022-12-23 NOTE — Progress Notes (Signed)
(  12/23/2022) NO-SHOW to right L4-5 LESI #1.

## 2023-01-02 ENCOUNTER — Ambulatory Visit: Payer: MEDICAID | Attending: Cardiology | Admitting: Cardiology

## 2023-01-05 ENCOUNTER — Encounter: Payer: Self-pay | Admitting: Cardiology

## 2023-01-08 NOTE — Progress Notes (Unsigned)
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  12/13/22 33.15 kg/m  12/08/22 33.20 kg/m  11/24/22 33.79 kg/m  10/06/22 34.36 kg/m   Wt Readings from Last 4 Encounters:  12/13/22 169 lb 12.1 oz (77 kg)  12/08/22 170 lb (77.1 kg)  11/24/22 173 lb (78.5 kg)  10/06/22 175 lb 14.8 oz (79.8 kg)    Psych/Mental status: Alert, oriented x 3 (person, place, & time)       Eyes: PERLA Respiratory: No evidence of acute respiratory distress  Assessment & Plan  Primary Diagnosis & Pertinent Problem List: There were no encounter diagnoses. Visit Diagnosis: No diagnosis found. Problems updated and reviewed during this  visit: No problems updated.  Plan of Care  Assessment and Plan             Pharmacotherapy (Medications Ordered): No orders of the defined types were placed in this encounter.  Procedure Orders    No procedure(s) ordered today   Lab Orders  No laboratory test(s) ordered today   Imaging Orders  No imaging studies ordered today   Referral Orders  No referral(s) requested today    Pharmacological management:  Opioid Analgesics: I will not be prescribing any opioids at this time Membrane stabilizer: I will not be prescribing any at this time Muscle relaxant: I will not be prescribing any at this time NSAID: I will not be prescribing any at this time Other analgesic(s): I will not be prescribing any at this time      Interventional Therapies  Risk Factors  Considerations  Medical Comorbidities:     Planned  Pending:   Diagnostic x-rays of the tailbone/sacrum. Diagnostic/therapeutic midline caudal ESI #1    Under consideration:   Diagnostic/therapeutic midline caudal ESI #1  Diagnostic/therapeutic right L4-5 interlaminar LESI #1  Diagnostic/therapeutic bilateral lumbar facet MBB #1    Completed:   None at this time   Therapeutic  Palliative (PRN) options:   None established   Completed by other providers:   Diagnostic/therapeutic right S1 TFESI x1 (10/10/2022) by Filomena Jungling, MD [facility])  Diagnostic/therapeutic right L5 TFESI x1 (09/05/2022) by Filomena Jungling, MD [facility])        Provider-requested follow-up: No follow-ups on file. Recent Visits Date Type Provider Dept  11/24/22 Office Visit Delano Metz, MD [facility]  Showing recent visits within past 90 days and meeting all other requirements Future Appointments Date Type Provider Dept  01/12/23 Appointment Delano Metz, MD [facility]  Showing future appointments within next 90 days and meeting all other requirements   Primary Care Physician: Armando Gang, FNP  Duration of encounter: *** minutes.  Total time on encounter, as per AMA guidelines included both the face-to-face and non-face-to-face time personally spent by the physician and/or other qualified health care professional(s) on the day of the encounter (includes time in activities that require the physician or other qualified health care professional and does not include time in activities normally performed by clinical staff). Physician's time may include the following activities when performed: Preparing to see the patient (e.g., pre-charting review of records, searching for previously ordered imaging, lab work, and nerve conduction tests) Review of prior analgesic pharmacotherapies. Reviewing PMP Interpreting ordered tests (e.g., lab work, imaging, nerve conduction tests) Performing post-procedure evaluations, including interpretation of diagnostic procedures Obtaining and/or reviewing separately obtained history Performing a medically appropriate examination and/or  evaluation Counseling and educating the patient/family/caregiver Ordering medications, tests, or procedures Referring and communicating with other health care professionals (when not separately reported) Documenting clinical information in the electronic or other health record Independently interpreting results (not separately reported) and communicating results to the patient/ family/caregiver Care coordination (not separately reported)  Note by: Oswaldo Done, MD (TTS technology used. I apologize for any typographical errors that were not detected and corrected.) Date: 01/12/2023; Time: 4:25 PM

## 2023-01-09 ENCOUNTER — Telehealth: Payer: Self-pay

## 2023-01-09 NOTE — Telephone Encounter (Signed)
Dr Laban Emperor, we cancelled her appointment for Monday 01-12-23 because she did not get her xrays done for her 2nd visit.

## 2023-01-12 ENCOUNTER — Ambulatory Visit (HOSPITAL_BASED_OUTPATIENT_CLINIC_OR_DEPARTMENT_OTHER): Payer: MEDICAID | Admitting: Pain Medicine

## 2023-01-12 ENCOUNTER — Ambulatory Visit
Admission: RE | Admit: 2023-01-12 | Discharge: 2023-01-12 | Disposition: A | Discharge status: Home / Self Care | Payer: MEDICAID | Source: Ambulatory Visit | Attending: Pain Medicine | Admitting: Pain Medicine

## 2023-01-12 DIAGNOSIS — M5126 Other intervertebral disc displacement, lumbar region: Secondary | ICD-10-CM | POA: Insufficient documentation

## 2023-01-12 DIAGNOSIS — S3992XS Unspecified injury of lower back, sequela: Secondary | ICD-10-CM | POA: Diagnosis present

## 2023-01-12 DIAGNOSIS — M5417 Radiculopathy, lumbosacral region: Secondary | ICD-10-CM | POA: Diagnosis present

## 2023-01-12 DIAGNOSIS — M79604 Pain in right leg: Secondary | ICD-10-CM | POA: Diagnosis present

## 2023-01-12 DIAGNOSIS — M5441 Lumbago with sciatica, right side: Secondary | ICD-10-CM | POA: Diagnosis present

## 2023-01-12 DIAGNOSIS — G8929 Other chronic pain: Secondary | ICD-10-CM | POA: Insufficient documentation

## 2023-01-12 DIAGNOSIS — Z91199 Patient's noncompliance with other medical treatment and regimen due to unspecified reason: Secondary | ICD-10-CM

## 2023-01-12 DIAGNOSIS — M5410 Radiculopathy, site unspecified: Secondary | ICD-10-CM | POA: Diagnosis present

## 2023-01-12 NOTE — Patient Instructions (Signed)

## 2023-02-10 ENCOUNTER — Encounter: Payer: Self-pay | Admitting: *Deleted

## 2023-02-17 ENCOUNTER — Ambulatory Visit: Payer: MEDICAID | Attending: Cardiology | Admitting: Cardiology

## 2023-03-04 NOTE — Progress Notes (Signed)
 Referring Physician:  Donal Channing SQUIBB, FNP [address] [place],  [place] [address]  Primary Physician:  Donal Channing SQUIBB, FNP  History of Present Illness: 03/05/2023 Rhonda Palmer is here today with a chief complaint of back pain.  She has been having back pain primarily, but also has right leg pain.  Her left leg pain is much less.  She had an event when she was sitting and fell into a box last summer.  Prior to that event, she had no significant pain.  After that, she has dealt with significant back pain as bad as 9 out of 10.  Walking and sitting make it worse.  Tramadol  and sitting in a relaxed position have made it better.  She has tried physical therapy twice without improvement.  She has tried injections without improvement.  She has seen both physiatry and pain management without significant improvement in her pain.  Bowel/Bladder Dysfunction: none  Conservative measures:  Physical therapy: has participated in at [facility] from 12/31/22 to 01/30/23, and from 09/01/22 to 10/21/22, no relief Multimodal medical therapy including regular antiinflammatories:tramadol , gabapentin    Injections:  10/10/22: Right S1 TF ESI 09/05/22: Right L5-S1 TF ESI  Past Surgery: no spinal surgeries  Rhonda Palmer has no symptoms of cervical myelopathy.  The symptoms are causing a significant impact on the patient's life.   I have utilized the care everywhere function in epic to review the outside records available from external health systems.  Review of Systems:  A 10 point review of systems is negative, except for the pertinent positives and negatives detailed in the HPI.  Past Medical History: Past Medical History:  Diagnosis Date   (HFpEF) heart failure with preserved ejection fraction (HCC)    a. 02/2020 Echo: EF 55-60%, no rwma. Nl RV fxn.   Anxiety    Asthma    Bipolar 1 disorder (HCC)    Diabetes mellitus without complication (HCC)    Edema leg for twenty years per  patient   taking lasix  for edema   Fibromyalgia    History of cardiac catheterization    a. 02/2020 Cath: Nl cors. LVEDP -> torsemide  increased to 40mg  daily.   Hypertension    Hypothyroidism    Morbid obesity (HCC)    Ovarian cyst    Palpitations    a. 03/2020 Zio: RSR, avg HR 84 (55-203), occas SVT runs. No significant arrhythmias.  Most triggered events associate with sinus rhythm.   Schizo affective schizophrenia [facility]     Past Surgical History: Past Surgical History:  Procedure Laterality Date   ABDOMINAL HYSTERECTOMY     ANKLE SURGERY     pt. states long time ago   LAPAROTOMY N/A 01/05/2022   Procedure: EXPLORATORY LAPAROTOMY WITH REMOVALOF ABDOMINAL ABSCESS;  Surgeon: Tye Millet, DO;  Location: [facility] ORS;  Service: General;  Laterality: N/A;   LEFT HEART CATH AND CORONARY ANGIOGRAPHY N/A 03/13/2020   Procedure: LEFT HEART CATH AND CORONARY ANGIOGRAPHY;  Surgeon: Mady Bruckner, MD;  Location: [facility] INVASIVE CV LAB;  Service: Cardiovascular;  Laterality: N/A;    Allergies: Allergies as of 03/05/2023 - Review Complete 03/05/2023  Allergen Reaction Noted   Codeine Hives, Itching, Swelling, and Rash 05/01/2012   Sulfa antibiotics Anaphylaxis 12/08/2015   Penicillins Itching 08/12/2014   Aspirin  Rash 12/19/2012    Medications:  Current Outpatient Medications:    ARISTADA  1064 MG/3.9ML prefilled syringe, Inject into the muscle., Disp: , Rfl:    clonazePAM  (KLONOPIN ) 0.5 MG tablet,  Take by mouth., Disp: , Rfl:    gabapentin  (NEURONTIN ) 300 MG capsule, Take 300 mg by mouth 3 (three) times daily., Disp: , Rfl:    lamoTRIgine  (LAMICTAL ) 150 MG tablet, Take 150 mg by mouth daily., Disp: , Rfl:    levothyroxine  (SYNTHROID ) 125 MCG tablet, Take 2 tablets (250 mcg total) by mouth daily at 6 (six) AM., Disp: 180 tablet, Rfl: 0   lisinopril  (ZESTRIL ) 20 MG tablet, Take 1 tablet (20 mg total) by mouth daily., Disp: , Rfl:    ondansetron  (ZOFRAN -ODT) 4 MG disintegrating tablet,  Take 4 mg by mouth 3 (three) times daily as needed., Disp: , Rfl:    OZEMPIC, 1 MG/DOSE, 4 MG/3ML SOPN, Inject 1 mg into the skin once a week., Disp: , Rfl:    QUEtiapine  (SEROQUEL ) 200 MG tablet, Take 400 mg by mouth at bedtime., Disp: , Rfl:    torsemide  (DEMADEX ) 20 MG tablet, Take 1 tablet (20 mg total) by mouth daily., Disp: , Rfl:    traMADol  (ULTRAM ) 50 MG tablet, Take 1 tablet by mouth every 6 (six) hours as needed., Disp: , Rfl:    traMADol  (ULTRAM ) 50 MG tablet, Take 1 tablet (50 mg total) by mouth every 6 (six) hours as needed for up to 5 days., Disp: 20 tablet, Rfl: 0  Social History: Social History   Tobacco Use   Smoking status: Never   Smokeless tobacco: Never  Vaping Use   Vaping status: Never Used  Substance Use Topics   Alcohol use: No   Drug use: No    Family Medical History: Family History  Problem Relation Age of Onset   Heart failure Mother    Bladder Cancer Neg Hx    Kidney cancer Neg Hx     Physical Examination: Vitals:   03/05/23 0845  BP: 128/82    General: Patient is in no apparent distress. Attention to examination is appropriate.  Neck:   Supple.  Full range of motion.  Respiratory: Patient is breathing without any difficulty.   NEUROLOGICAL:     Awake, alert, oriented to person, place, and time.  Speech is clear and fluent.   Cranial Nerves: Pupils equal round and reactive to light.  Facial tone is symmetric.  Facial sensation is symmetric. Shoulder shrug is symmetric. Tongue protrusion is midline.  There is no pronator drift.  Strength: Side Biceps Triceps Deltoid Interossei Grip Wrist Ext. Wrist Flex.  R 5 5 5 5 5 5 5   L 5 5 5 5 5 5 5    Side Iliopsoas Quads Hamstring PF DF EHL  R 5 5 5 5 5 5   L 5 5 5 5 5 5    Reflexes are 1+ and symmetric at the biceps, triceps, brachioradialis, patella and achilles.   Hoffman's is absent.   Bilateral upper and lower extremity sensation is intact to light touch.    No evidence of dysmetria  noted.  Gait is normal.    SLR causes back pain worse on R than L.   Medical Decision Making  Imaging: MRI L Spine 07/24/2022 Disc levels:   Visible lower thoracic levels through L2-L3 appear normal.   L3-L4: Subtle disc desiccation.  Otherwise negative.   L4-L5: Mild disc desiccation. Small right paracentral disc protrusion with annular fissure (series 113, image 14 and series 107, image 8). Borderline to mild facet hypertrophy but capacious spinal canal and neural foramina. No stenosis.   L5-S1: Disc desiccation and disc space loss. Small central disc protrusion on series  113, image 19. Borderline to mild facet hypertrophy. No stenosis.   IMPRESSION: 1. L4-L5 disc degeneration with small right paracentral disc herniation and annular fissure. No spinal stenosis or neural impingement, but this might be a source for right L5 radiculitis. 2. Minimal disc degeneration at both L3-L4 and L5-S1 without stenosis.     Electronically Signed   By: VEAR Hurst M.D.   On: 07/24/2022 08:19  I have personally reviewed the images and agree with the above interpretation.  Assessment and Plan: Rhonda Palmer is a pleasant 53 y.o. female with back pain with right-sided sciatica.  She has no significant nerve root compression.  She has no evidence of spinal alignment problems or instability on her x-rays.  Unfortunately, there is no surgical intervention that is indicated at this time.  I have recommended that she return to pain management for consideration of further interventions, medical management, or stimulation procedures.  She did not have a great experience at her last pain management site, so we will refer her outside the system per her request.  I spent a total of 30 minutes in this patient's care today. This time was spent reviewing pertinent records including imaging studies, obtaining and confirming history, performing a directed evaluation, formulating and discussing my  recommendations, and documenting the visit within the medical record.   I will make a one-time prescription of tramadol  to tide her over until she sees pain management.   Thank you for involving me in the care of this patient.      Rhonda Palmer K. Clois MD, [facility] Neurosurgery

## 2023-03-05 ENCOUNTER — Ambulatory Visit: Payer: MEDICAID | Admitting: Neurosurgery

## 2023-03-05 VITALS — BP 128/82 | Ht 60.0 in | Wt 180.0 lb

## 2023-03-05 DIAGNOSIS — G8929 Other chronic pain: Secondary | ICD-10-CM

## 2023-03-05 DIAGNOSIS — M5441 Lumbago with sciatica, right side: Secondary | ICD-10-CM

## 2023-03-05 MED ORDER — TRAMADOL HCL 50 MG PO TABS
50.0000 mg | ORAL_TABLET | Freq: Four times a day (QID) | ORAL | 0 refills | Status: AC | PRN
Start: 2023-03-05 — End: 2023-03-10

## 2023-05-13 ENCOUNTER — Emergency Department: Payer: MEDICAID

## 2023-05-13 ENCOUNTER — Other Ambulatory Visit: Payer: Self-pay

## 2023-05-13 ENCOUNTER — Inpatient Hospital Stay
Admission: EM | Admit: 2023-05-13 | Discharge: 2023-05-15 | DRG: 069 | Disposition: A | Discharge status: Home Health | Payer: MEDICAID | Attending: Internal Medicine | Admitting: Internal Medicine

## 2023-05-13 ENCOUNTER — Observation Stay: Payer: MEDICAID

## 2023-05-13 DIAGNOSIS — Z886 Allergy status to analgesic agent status: Secondary | ICD-10-CM

## 2023-05-13 DIAGNOSIS — E785 Hyperlipidemia, unspecified: Secondary | ICD-10-CM | POA: Diagnosis present

## 2023-05-13 DIAGNOSIS — G459 Transient cerebral ischemic attack, unspecified: Principal | ICD-10-CM | POA: Diagnosis present

## 2023-05-13 DIAGNOSIS — G894 Chronic pain syndrome: Secondary | ICD-10-CM | POA: Diagnosis present

## 2023-05-13 DIAGNOSIS — I10 Essential (primary) hypertension: Secondary | ICD-10-CM | POA: Diagnosis present

## 2023-05-13 DIAGNOSIS — K219 Gastro-esophageal reflux disease without esophagitis: Secondary | ICD-10-CM | POA: Diagnosis present

## 2023-05-13 DIAGNOSIS — Z8249 Family history of ischemic heart disease and other diseases of the circulatory system: Secondary | ICD-10-CM

## 2023-05-13 DIAGNOSIS — R2681 Unsteadiness on feet: Secondary | ICD-10-CM | POA: Diagnosis present

## 2023-05-13 DIAGNOSIS — E119 Type 2 diabetes mellitus without complications: Secondary | ICD-10-CM | POA: Diagnosis present

## 2023-05-13 DIAGNOSIS — Z90710 Acquired absence of both cervix and uterus: Secondary | ICD-10-CM

## 2023-05-13 DIAGNOSIS — Z885 Allergy status to narcotic agent status: Secondary | ICD-10-CM

## 2023-05-13 DIAGNOSIS — E039 Hypothyroidism, unspecified: Secondary | ICD-10-CM | POA: Diagnosis present

## 2023-05-13 DIAGNOSIS — Z880 Allergy status to penicillin: Secondary | ICD-10-CM

## 2023-05-13 DIAGNOSIS — Z7984 Long term (current) use of oral hypoglycemic drugs: Secondary | ICD-10-CM

## 2023-05-13 DIAGNOSIS — R519 Headache, unspecified: Secondary | ICD-10-CM | POA: Insufficient documentation

## 2023-05-13 DIAGNOSIS — I5032 Chronic diastolic (congestive) heart failure: Secondary | ICD-10-CM | POA: Diagnosis present

## 2023-05-13 DIAGNOSIS — Z79899 Other long term (current) drug therapy: Secondary | ICD-10-CM

## 2023-05-13 DIAGNOSIS — Z7985 Long-term (current) use of injectable non-insulin antidiabetic drugs: Secondary | ICD-10-CM

## 2023-05-13 DIAGNOSIS — R42 Dizziness and giddiness: Principal | ICD-10-CM

## 2023-05-13 DIAGNOSIS — E782 Mixed hyperlipidemia: Secondary | ICD-10-CM | POA: Diagnosis present

## 2023-05-13 DIAGNOSIS — I110 Hypertensive heart disease with heart failure: Secondary | ICD-10-CM | POA: Diagnosis present

## 2023-05-13 DIAGNOSIS — R531 Weakness: Secondary | ICD-10-CM | POA: Diagnosis present

## 2023-05-13 DIAGNOSIS — F250 Schizoaffective disorder, bipolar type: Secondary | ICD-10-CM | POA: Diagnosis present

## 2023-05-13 DIAGNOSIS — Z794 Long term (current) use of insulin: Secondary | ICD-10-CM

## 2023-05-13 DIAGNOSIS — E876 Hypokalemia: Secondary | ICD-10-CM | POA: Diagnosis present

## 2023-05-13 DIAGNOSIS — R609 Edema, unspecified: Secondary | ICD-10-CM | POA: Diagnosis present

## 2023-05-13 DIAGNOSIS — E66812 Obesity, class 2: Secondary | ICD-10-CM | POA: Diagnosis present

## 2023-05-13 DIAGNOSIS — F259 Schizoaffective disorder, unspecified: Secondary | ICD-10-CM

## 2023-05-13 DIAGNOSIS — M797 Fibromyalgia: Secondary | ICD-10-CM | POA: Diagnosis present

## 2023-05-13 DIAGNOSIS — F419 Anxiety disorder, unspecified: Secondary | ICD-10-CM | POA: Diagnosis present

## 2023-05-13 DIAGNOSIS — F319 Bipolar disorder, unspecified: Secondary | ICD-10-CM | POA: Diagnosis present

## 2023-05-13 DIAGNOSIS — Z882 Allergy status to sulfonamides status: Secondary | ICD-10-CM

## 2023-05-13 DIAGNOSIS — Z79890 Hormone replacement therapy (postmenopausal): Secondary | ICD-10-CM

## 2023-05-13 DIAGNOSIS — F609 Personality disorder, unspecified: Secondary | ICD-10-CM | POA: Diagnosis present

## 2023-05-13 DIAGNOSIS — J45909 Unspecified asthma, uncomplicated: Secondary | ICD-10-CM | POA: Diagnosis present

## 2023-05-13 DIAGNOSIS — F1311 Sedative, hypnotic or anxiolytic abuse, in remission: Secondary | ICD-10-CM | POA: Diagnosis present

## 2023-05-13 DIAGNOSIS — F1111 Opioid abuse, in remission: Secondary | ICD-10-CM | POA: Diagnosis present

## 2023-05-13 DIAGNOSIS — R4182 Altered mental status, unspecified: Secondary | ICD-10-CM | POA: Diagnosis present

## 2023-05-13 DIAGNOSIS — Z6835 Body mass index (BMI) 35.0-35.9, adult: Secondary | ICD-10-CM

## 2023-05-13 LAB — CBC WITH DIFFERENTIAL/PLATELET
Abs Immature Granulocytes: 0.03 10*3/uL (ref 0.00–0.07)
Basophils Absolute: 0.1 10*3/uL (ref 0.0–0.1)
Basophils Relative: 1 %
Eosinophils Absolute: 0.2 10*3/uL (ref 0.0–0.5)
Eosinophils Relative: 2 %
HCT: 44.9 % (ref 36.0–46.0)
Hemoglobin: 14.4 g/dL (ref 12.0–15.0)
Immature Granulocytes: 0 %
Lymphocytes Relative: 24 %
Lymphs Abs: 2.4 10*3/uL (ref 0.7–4.0)
MCH: 28 pg (ref 26.0–34.0)
MCHC: 32.1 g/dL (ref 30.0–36.0)
MCV: 87.2 fL (ref 80.0–100.0)
Monocytes Absolute: 0.6 10*3/uL (ref 0.1–1.0)
Monocytes Relative: 6 %
Neutro Abs: 6.6 10*3/uL (ref 1.7–7.7)
Neutrophils Relative %: 67 %
Platelets: 211 10*3/uL (ref 150–400)
RBC: 5.15 MIL/uL — ABNORMAL HIGH (ref 3.87–5.11)
RDW: 13.8 % (ref 11.5–15.5)
WBC: 9.9 10*3/uL (ref 4.0–10.5)
nRBC: 0 % (ref 0.0–0.2)

## 2023-05-13 LAB — GLUCOSE, CAPILLARY: Glucose-Capillary: 103 mg/dL — ABNORMAL HIGH (ref 70–99)

## 2023-05-13 LAB — TROPONIN I (HIGH SENSITIVITY)
Troponin I (High Sensitivity): 4 ng/L (ref <18)
Troponin I (High Sensitivity): 5 ng/L (ref <18)

## 2023-05-13 LAB — URINALYSIS, W/ REFLEX TO CULTURE (INFECTION SUSPECTED)
Glucose, UA: NEGATIVE mg/dL
Hgb urine dipstick: NEGATIVE
Ketones, ur: 5 mg/dL — AB
Nitrite: NEGATIVE
Protein, ur: 30 mg/dL — AB
Specific Gravity, Urine: 1.025 (ref 1.005–1.030)
pH: 5 (ref 5.0–8.0)

## 2023-05-13 LAB — URINE DRUG SCREEN, QUALITATIVE (ARMC ONLY)
Amphetamines, Ur Screen: NOT DETECTED
Barbiturates, Ur Screen: NOT DETECTED
Benzodiazepine, Ur Scrn: NOT DETECTED
Cannabinoid 50 Ng, Ur ~~LOC~~: NOT DETECTED
Cocaine Metabolite,Ur ~~LOC~~: NOT DETECTED
MDMA (Ecstasy)Ur Screen: NOT DETECTED
Methadone Scn, Ur: NOT DETECTED
Opiate, Ur Screen: NOT DETECTED
Phencyclidine (PCP) Ur S: NOT DETECTED
Tricyclic, Ur Screen: POSITIVE — AB

## 2023-05-13 LAB — COMPREHENSIVE METABOLIC PANEL WITH GFR
ALT: 21 U/L (ref 0–44)
AST: 19 U/L (ref 15–41)
Albumin: 4 g/dL (ref 3.5–5.0)
Alkaline Phosphatase: 126 U/L (ref 38–126)
Anion gap: 7 (ref 5–15)
BUN: 18 mg/dL (ref 6–20)
CO2: 23 mmol/L (ref 22–32)
Calcium: 9 mg/dL (ref 8.9–10.3)
Chloride: 106 mmol/L (ref 98–111)
Creatinine, Ser: 0.72 mg/dL (ref 0.44–1.00)
GFR, Estimated: >60 mL/min (ref >60)
Glucose, Bld: 242 mg/dL — ABNORMAL HIGH (ref 70–99)
Potassium: 3.4 mmol/L — ABNORMAL LOW (ref 3.5–5.1)
Sodium: 136 mmol/L (ref 135–145)
Total Bilirubin: 1.2 mg/dL (ref 0.0–1.2)
Total Protein: 7.4 g/dL (ref 6.5–8.1)

## 2023-05-13 LAB — TSH: TSH: 1.961 u[IU]/mL (ref 0.350–4.500)

## 2023-05-13 LAB — PHOSPHORUS: Phosphorus: 2.3 mg/dL — ABNORMAL LOW (ref 2.5–4.6)

## 2023-05-13 LAB — LIPASE, BLOOD: Lipase: 25 U/L (ref 11–51)

## 2023-05-13 LAB — MAGNESIUM: Magnesium: 2 mg/dL (ref 1.7–2.4)

## 2023-05-13 MED ORDER — SENNOSIDES-DOCUSATE SODIUM 8.6-50 MG PO TABS: 1.0000 | ORAL_TABLET | Freq: Every evening | ORAL | Status: DC | PRN

## 2023-05-13 MED ORDER — HEPARIN SODIUM (PORCINE) 5000 UNIT/ML IJ SOLN
5000.0000 [IU] | Freq: Three times a day (TID) | INTRAMUSCULAR | Status: DC
  Administered 2023-05-14 – 2023-05-15 (×4): 5000 [IU] via SUBCUTANEOUS
  Filled 2023-05-13 (×4): qty 1

## 2023-05-13 MED ORDER — ACETAMINOPHEN 160 MG/5ML PO SOLN: 650.0000 mg | ORAL | Status: DC | PRN

## 2023-05-13 MED ORDER — LACTATED RINGERS IV BOLUS
1000.0000 mL | Freq: Once | INTRAVENOUS | Status: AC
  Administered 2023-05-13: 1000 mL via INTRAVENOUS

## 2023-05-13 MED ORDER — IOHEXOL 350 MG/ML SOLN
75.0000 mL | Freq: Once | INTRAVENOUS | Status: AC | PRN
  Administered 2023-05-13: 75 mL via INTRAVENOUS

## 2023-05-13 MED ORDER — GABAPENTIN 300 MG PO CAPS
300.0000 mg | ORAL_CAPSULE | Freq: Three times a day (TID) | ORAL | Status: DC
Start: 2023-05-13 — End: 2023-05-13
  Filled 2023-05-13: qty 1

## 2023-05-13 MED ORDER — SODIUM CHLORIDE 0.9 % IV BOLUS
500.0000 mL | Freq: Once | INTRAVENOUS | Status: AC
  Administered 2023-05-13: 500 mL via INTRAVENOUS

## 2023-05-13 MED ORDER — STROKE: EARLY STAGES OF RECOVERY BOOK: Freq: Once | Status: AC

## 2023-05-13 MED ORDER — TRAMADOL HCL 50 MG PO TABS
50.0000 mg | ORAL_TABLET | Freq: Four times a day (QID) | ORAL | Status: DC | PRN
  Administered 2023-05-13 – 2023-05-15 (×6): 50 mg via ORAL
  Filled 2023-05-13 (×6): qty 1

## 2023-05-13 MED ORDER — INSULIN ASPART 100 UNIT/ML IJ SOLN: 0.0000 [IU] | Freq: Every day | INTRAMUSCULAR | Status: DC

## 2023-05-13 MED ORDER — LEVOTHYROXINE SODIUM 50 MCG PO TABS
250.0000 ug | ORAL_TABLET | Freq: Every day | ORAL | Status: DC
  Administered 2023-05-14 – 2023-05-15 (×2): 250 ug via ORAL
  Filled 2023-05-13 (×2): qty 1

## 2023-05-13 MED ORDER — ACETAMINOPHEN 325 MG PO TABS
650.0000 mg | ORAL_TABLET | ORAL | Status: DC | PRN
  Administered 2023-05-13 – 2023-05-15 (×4): 650 mg via ORAL
  Filled 2023-05-13 (×4): qty 2

## 2023-05-13 MED ORDER — QUETIAPINE FUMARATE 300 MG PO TABS
350.0000 mg | ORAL_TABLET | Freq: Every day | ORAL | Status: DC
  Administered 2023-05-13 – 2023-05-14 (×2): 350 mg via ORAL
  Filled 2023-05-13 (×2): qty 2

## 2023-05-13 MED ORDER — ACETAMINOPHEN 650 MG RE SUPP: 650.0000 mg | RECTAL | Status: DC | PRN

## 2023-05-13 MED ORDER — HYDRALAZINE HCL 20 MG/ML IJ SOLN: 5.0000 mg | Freq: Four times a day (QID) | INTRAMUSCULAR | Status: DC | PRN

## 2023-05-13 MED ORDER — LAMOTRIGINE 100 MG PO TABS
200.0000 mg | ORAL_TABLET | Freq: Every day | ORAL | Status: DC
  Administered 2023-05-14 – 2023-05-15 (×2): 200 mg via ORAL
  Filled 2023-05-13 (×2): qty 2

## 2023-05-13 MED ORDER — TRAMADOL HCL 50 MG PO TABS
50.0000 mg | ORAL_TABLET | Freq: Once | ORAL | Status: AC
  Administered 2023-05-13: 50 mg via ORAL
  Filled 2023-05-13: qty 1

## 2023-05-13 MED ORDER — INSULIN ASPART 100 UNIT/ML IJ SOLN
0.0000 [IU] | Freq: Three times a day (TID) | INTRAMUSCULAR | Status: DC
  Administered 2023-05-14: 2 [IU] via SUBCUTANEOUS
  Filled 2023-05-13: qty 1

## 2023-05-13 MED ORDER — K PHOS MONO-SOD PHOS DI & MONO 155-852-130 MG PO TABS
250.0000 mg | ORAL_TABLET | Freq: Once | ORAL | Status: AC
  Administered 2023-05-13: 250 mg via ORAL
  Filled 2023-05-13 (×2): qty 1

## 2023-05-13 MED ORDER — CLONAZEPAM 0.5 MG PO TABS
0.5000 mg | ORAL_TABLET | Freq: Two times a day (BID) | ORAL | Status: DC | PRN
  Administered 2023-05-14 – 2023-05-15 (×3): 0.5 mg via ORAL
  Filled 2023-05-13 (×3): qty 1

## 2023-05-13 NOTE — ED Triage Notes (Signed)
 Pt to ED via [facility] from home for AMS. Per EMS daughter found pt normal at 10am. Pt not verbally responsive at this time but opening eyes spontaneously. Per EMS pt stated to them that she felt numb and tingling. Pt able to follow some commands at this [facility] vitals  HR 52 BP 140/101 ETCO2 42

## 2023-05-13 NOTE — H&P (Addendum)
 History and Physical   Rhonda Palmer ZOX:[id number] DOB: 04/04/1970 DOA: 05/13/2023  PCP: Armando Gang, FNP  Outpatient Specialists: Dr. Laban Emperor, chronic pain management Patient coming from: Home  I have personally briefly reviewed patient's old medical records in [facility] EMR.  Chief Concern: headache  HPI: Ms. 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  Vitals in the ED showed T of 98.9, rr 22, heart rate 53, BP 165/97, SpO2 95% on room air.  Serum sodium is 136, potassium 3.4, chloride 106, bicarb 23, BUN of 18, serum creatinine of 0.72, EGFR greater than 60, nonfasting blood glucose 242, WBC 9.9, hemoglobin 14.4, platelets of 211.  High sensitivity troponin is 4.  Phosphorus level was 2.3.  Magnesium level was within normal limits.  High sensitive troponin was 4.  UA was positive for moderate leukocytes.  ED treatment: LR 1.5 L bolus, tramadol 50 mg p.o. one-time dose. -------------------------------------------- At bedside, patient was able to tell me her first and last name, age, location, current calendar year.  She reports that at around 9:30 this morning, she developed a dull headache on the back of her head in the left and the right side of her head but not the front of her head.  She reports this still headache lasted until 11:30 this morning.  She reports she is never had a headache like this before.  She denies regular caffeine intake including coffee, tea, sodas.  She reports she drinks unsweetened tea about twice per week.  She denies changes to her medications.  She endorses compliance with her home medication including her psychiatric  medications.  Social history: She lives at home with her daughter.  She denies tobacco, EtOH, recreational drug use.  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  ROS: Constitutional: no weight change, no fever, + headache ENT/Mouth: no sore throat, no rhinorrhea Eyes: no eye pain, no vision changes Cardiovascular: no chest pain, no dyspnea,  no edema, no palpitations Respiratory: no cough, no sputum, no wheezing Gastrointestinal: no nausea, no vomiting, no diarrhea, no constipation Genitourinary: no urinary incontinence, no dysuria, no hematuria Musculoskeletal: no arthralgias, no myalgias Skin: no skin lesions, no pruritus, Neuro: + weakness, no loss of consciousness, no syncope Psych: no anxiety, no depression, + decrease appetite Heme/Lymph: no bruising, no bleeding  ED Course: Discussed with the EDP, patient requiring hospitalization for chief concerns of TIA.  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)   GERD (gastroesophageal reflux disease)   Chronic pain syndrome   Bipolar 1 disorder (HCC)   HLD (hyperlipidemia)   Hypophosphatemia   Morbid obesity (HCC)   Headache   Assessment and Plan:  * TIA (transient ischemic attack) Stroke-like symptoms MRI of the brain Fasting lipid and A1c ordered Frequent neuro vascular checks PT, OT Fall precaution and aspiration precaution  Hypertension Hydralazine 5 mg IV every 6 hours as needed for SBP > 180, 5 days ordered  Hypothyroidism Home levothyroxine  250 mcg daily resumed  Diabetes mellitus type 2, uncomplicated (HCC) Home Ozempic will not be resumed on admission Insulin  SSI with at bedtime coverage ordered Goal inpatient blood glucose levels 140-180  Headache This is new for the patient Agree with MRI of the brain without contrast  Morbid obesity (HCC) This complicates overall care and prognosis.   Hypophosphatemia K phosphorus neutral to 50 mg tablet p.o. one-time dose ordered  Bipolar 1 disorder (HCC) Medications were reviewed with inpatient pharmacy service over the phone Home quetiapine 350 mg nightly, Lamictal 200 mg daily, were resumed on admission  Chronic pain syndrome Home tramadol 50 mg every 6 hours as needed for moderate pain resumed on admission  History of opioid abuse (HCC) PDMP reviewed  Chart reviewed.   DVT prophylaxis: Heparin 5000 units subcutaneous every 8 hours Code Status: Full code Diet: Heart healthy/carb modified; pending nursing swallow screening Family Communication: A phone call was offered, patient declined stating that she ready updated and told her daughter. Disposition Plan: Pending clinical course Consults called: None at this time; if MRI is negative, patient may need behavioral health consultation for medication optimization Admission status: Telemetry medical, observation  Past Medical History:  Diagnosis Date   (HFpEF) heart failure with preserved ejection fraction (HCC)    a. 02/2020 Echo: EF 55-60%, no rwma. Nl RV fxn.   Anxiety    Asthma    Bipolar 1 disorder (HCC)    Diabetes mellitus without complication (HCC)    Edema leg for twenty years per patient   taking lasix for edema   Fibromyalgia    History of cardiac catheterization    a. 02/2020 Cath: Nl cors. LVEDP -> torsemide increased to 40mg  daily.   Hypertension    Hypothyroidism    Morbid obesity (HCC)    Ovarian cyst    Palpitations    a. 03/2020 Zio: RSR, avg HR 84 (55-203), occas SVT runs. No significant arrhythmias.  Most triggered events associate with sinus rhythm.   Schizo affective schizophrenia [facility])    Past Surgical  History:  Procedure Laterality Date   ABDOMINAL HYSTERECTOMY     ANKLE SURGERY     pt. states long time ago   LAPAROTOMY N/A 01/05/2022   Procedure: EXPLORATORY LAPAROTOMY WITH REMOVALOF ABDOMINAL ABSCESS;  Surgeon: Sung Amabile, DO;  Location: [facility] ORS;  Service: General;  Laterality: N/A;   LEFT HEART CATH AND CORONARY ANGIOGRAPHY N/A 03/13/2020   Procedure: LEFT HEART CATH AND CORONARY ANGIOGRAPHY;  Surgeon: Yvonne Kendall, MD;  Location: [facility] INVASIVE CV LAB;  Service: Cardiovascular;  Laterality: N/A;   Social History:  reports that she has never smoked. She has never used smokeless tobacco. She reports that she does not drink alcohol and does not use drugs.  Allergies  Allergen Reactions   Codeine Hives, Itching, Swelling and Rash    Takes oxycodone, morphine    Sulfa Antibiotics Anaphylaxis   Penicillins Itching    TOLERATED CEFAZOLIN   Aspirin Rash   Family History  Problem Relation Age of Onset   Heart failure Mother    Bladder Cancer Neg Hx    Kidney cancer Neg Hx    Family history: Family history reviewed and not pertinent.  Prior to Admission medications   Medication Sig Start Date End Date Taking? Authorizing Provider  ARISTADA [phone] MG/3.9ML prefilled syringe Inject into the muscle. 06/19/22   [provider]  clonazePAM (KLONOPIN) 0.5 MG tablet Take by mouth.    [provider]  gabapentin (  NEURONTIN) 300 MG capsule Take 300 mg by mouth 3 (three) times daily.    [provider]  lamoTRIgine (LAMICTAL) 150 MG tablet Take 150 mg by mouth daily. 10/09/22   [provider]  levothyroxine (SYNTHROID) 125 MCG tablet Take 2 tablets (250 mcg total) by mouth daily at 6 (six) AM. 01/14/22 03/05/23  Almon Hercules, MD  lisinopril (ZESTRIL) 20 MG tablet Take 1 tablet (20 mg total) by mouth daily. 01/16/22   Almon Hercules, MD  ondansetron (ZOFRAN-ODT) 4 MG disintegrating tablet Take 4 mg by mouth 3 (three) times daily as needed. 12/30/21    [provider]  OZEMPIC, 1 MG/DOSE, 4 MG/3ML SOPN Inject 1 mg into the skin once a week. 12/23/21   [provider]  QUEtiapine (SEROQUEL) 200 MG tablet Take 400 mg by mouth at bedtime. 12/30/21   [provider]  torsemide (DEMADEX) 20 MG tablet Take 1 tablet (20 mg total) by mouth daily. 01/20/22   Almon Hercules, MD  traMADol (ULTRAM) 50 MG tablet Take 1 tablet by mouth every 6 (six) hours as needed. 10/09/21   [provider]   Physical Exam: Vitals:   05/13/23 1545 05/13/23 1547 05/13/23 1758 05/13/23 1813  BP:   (!) 140/93   Pulse: 64  67   Resp: 19  20 19   Temp:  98.9 F (37.2 C) 98.5 F (36.9 C)   TempSrc:  Oral    SpO2: 98%  99%   Weight:      Height:       Constitutional: appears age appropriate, NAD, calm Eyes: PERRL, lids and conjunctivae normal ENMT: Mucous membranes are moist. Posterior pharynx clear of any exudate or lesions. Age-appropriate dentition. Hearing appropriate Neck: normal, supple, no masses, no thyromegaly Respiratory: clear to auscultation bilaterally, no wheezing, no crackles. Normal respiratory effort. No accessory muscle use.  Cardiovascular: Regular rate and rhythm, no murmurs / rubs / gallops. No extremity edema. 2+ pedal pulses. No carotid bruits.  Abdomen: no tenderness, no masses palpated, no hepatosplenomegaly. Bowel sounds positive.  Musculoskeletal: no clubbing / cyanosis. No joint deformity upper and lower extremities. Good ROM, no contractures, no atrophy. Normal muscle tone.  Skin: no rashes, lesions, ulcers. No induration Neurologic: Sensation intact. Strength 5/5 in all 4.  Psychiatric: Normal judgment and insight. Alert and oriented x 3. Normal mood.   EKG: independently reviewed, showing sinus rhythm with rate of 54, QTc 426  Chest x-ray on Admission: Not indicated at this time  CT ANGIO HEAD NECK W WO CM Result Date: 05/13/2023 CLINICAL DATA:  Vertigo, central. Schizoaffective disorder. Altered  mental status. EXAM: CT ANGIOGRAPHY HEAD AND NECK WITH AND WITHOUT CONTRAST TECHNIQUE: Multidetector CT imaging of the head and neck was performed using the standard protocol during bolus administration of intravenous contrast. Multiplanar CT image reconstructions and MIPs were obtained to evaluate the vascular anatomy. Carotid stenosis measurements (when applicable) are obtained utilizing NASCET criteria, using the distal internal carotid diameter as the denominator. RADIATION DOSE REDUCTION: This exam was performed according to the departmental dose-optimization program which includes automated exposure control, adjustment of the mA and/or kV according to patient size and/or use of iterative reconstruction technique. CONTRAST:  75mL OMNIPAQUE IOHEXOL 350 MG/ML SOLN COMPARISON:  Head CT earlier same day FINDINGS: CTA NECK FINDINGS Aortic arch: Normal Right carotid system: Common carotid artery widely patent to the bifurcation. Normal carotid bifurcation. Normal cervical ICA. Left carotid system: Common carotid artery widely patent to the bifurcation. Normal bifurcation. Normal  cervical ICA. Vertebral arteries: No proximal subclavian stenosis. Left vertebral artery arises from the arch. Both vertebral arteries are widely patent through the cervical region to the foramen magnum. Skeleton: No significant degenerative change. No acute cervical spine finding. There is either an old nonunited fracture of the spinous process of C7 or this is a developmental appearance. Not significant. Other neck: No mass or lymphadenopathy. Upper chest: Mild chronic scarring in the right upper lobe. Review of the MIP images confirms the above findings CTA HEAD FINDINGS Anterior circulation: Both internal carotid arteries are patent through the skull base and siphon regions. No siphon stenosis. The anterior and middle cerebral vessels patent. No occlusion or stenosis. No aneurysm or vascular malformation. Fetal origin left PCA. Posterior  circulation: Both vertebral arteries widely patent to the basilar artery. No basilar stenosis. Posterior circulation branch vessels are normal. Venous sinuses: Patent and normal. Anatomic variants: None significant. Review of the MIP images confirms the above findings IMPRESSION: 1. Normal CTA of the head and neck. No evidence of carotid or vertebral artery stenosis. No intracranial large or medium vessel occlusion or correctable stenosis. 2. Mild chronic scarring in the right upper lobe. Electronically Signed   By: Paulina Fusi M.D.   On: 05/13/2023 16:11   CT Head Wo Contrast Result Date: 05/13/2023 CLINICAL DATA:  Mental status change of unknown cause. Verbally unresponsive. EXAM: CT HEAD WITHOUT CONTRAST TECHNIQUE: Contiguous axial images were obtained from the base of the skull through the vertex without intravenous contrast. RADIATION DOSE REDUCTION: This exam was performed according to the departmental dose-optimization program which includes automated exposure control, adjustment of the mA and/or kV according to patient size and/or use of iterative reconstruction technique. COMPARISON:  01/11/2021 FINDINGS: Brain: The brain shows a normal appearance without evidence of malformation, atrophy, old or acute small or large vessel infarction, mass lesion, hemorrhage, hydrocephalus or extra-axial collection. Vascular: No hyperdense vessel. No evidence of atherosclerotic calcification. Skull: Normal.  No traumatic finding.  No focal bone lesion. Sinuses/Orbits: Sinuses are clear. Orbits appear normal. Mastoids are clear. Other: None significant IMPRESSION: Normal head CT. Electronically Signed   By: Paulina Fusi M.D.   On: 05/13/2023 12:10   Labs on Admission: I have personally reviewed following labs  CBC: Recent Labs  Lab 05/13/23 1148  WBC 9.9  NEUTROABS 6.6  HGB 14.4  HCT 44.9  MCV 87.2  PLT 211   Basic Metabolic Panel: Recent Labs  Lab 05/13/23 1148  NA 136  K 3.4*  CL 106  CO2 23   GLUCOSE 242*  BUN 18  CREATININE 0.72  CALCIUM 9.0  MG 2.0  PHOS 2.3*   GFR: Estimated Creatinine Clearance: 76 mL/min (by C-G formula based on SCr of 0.72 mg/dL).  Liver Function Tests: Recent Labs  Lab 05/13/23 1148  AST 19  ALT 21  ALKPHOS 126  BILITOT 1.2  PROT 7.4  ALBUMIN 4.0   Recent Labs  Lab 05/13/23 1148  LIPASE 25   Urine analysis:    Component Value Date/Time   COLORURINE YELLOW (A) 05/13/2023 1147   APPEARANCEUR HAZY (A) 05/13/2023 1147   APPEARANCEUR Clear 04/29/2017 0918   LABSPEC 1.025 05/13/2023 1147   PHURINE 5.0 05/13/2023 1147   GLUCOSEU NEGATIVE 05/13/2023 1147   HGBUR NEGATIVE 05/13/2023 1147   BILIRUBINUR SMALL (A) 05/13/2023 1147   BILIRUBINUR Negative 04/29/2017 0918   KETONESUR 5 (A) 05/13/2023 1147   PROTEINUR 30 (A) 05/13/2023 1147   NITRITE NEGATIVE 05/13/2023 1147   LEUKOCYTESUR MODERATE (  A) 05/13/2023 1147   This document was prepared using Dragon Voice Recognition software and may include unintentional dictation errors.  Dr. Reinhold Carbine [facility]  If 7PM-7AM, please contact overnight-coverage provider If 7AM-7PM, please contact day attending provider [email or web address]  05/13/2023, 6:33 PM

## 2023-05-13 NOTE — Hospital Course (Addendum)
 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 She reported a sudden onset of headache in the lower posterior head, severe vertigo, than started vomiting. In the ambulance, she could not talk.  Symptoms lasted for 2 hours, mostly resolved. But still feels dizzy with vertigo, unsteady gait.  At baseline, she exercises regularly.  MRI brain and CT angio of head and neck are negative.   Echocardiogram showed ejection fraction 60 to 65% with grade 1 diastolic dysfunction. Patient has been seen by neurology, condition consistent with a TIA, patient be treated with Plavix .

## 2023-05-13 NOTE — Assessment & Plan Note (Addendum)
 Medications were reviewed with inpatient pharmacy service over the phone Home quetiapine 350 mg nightly, Lamictal 200 mg daily, were resumed on admission

## 2023-05-13 NOTE — Assessment & Plan Note (Signed)
 -  This complicates overall care and prognosis.

## 2023-05-13 NOTE — Assessment & Plan Note (Signed)
 Hydralazine 5 mg IV every 6 hours as needed for SBP > 180, 5 days ordered

## 2023-05-13 NOTE — Assessment & Plan Note (Signed)
 This is new for the patient Agree with MRI of the brain without contrast

## 2023-05-13 NOTE — Assessment & Plan Note (Addendum)
 K phosphorus neutral to 50 mg tablet p.o. one-time dose ordered

## 2023-05-13 NOTE — Assessment & Plan Note (Signed)
 Home Ozempic will not be resumed on admission Insulin SSI with at bedtime coverage ordered Goal inpatient blood glucose levels 140-180

## 2023-05-13 NOTE — Plan of Care (Signed)
  Problem: Coping: Goal: Will verbalize positive feelings about self Outcome: Progressing Goal: Will identify appropriate support needs Outcome: Progressing   Problem: Self-Care: Goal: Ability to participate in self-care as condition permits will improve Outcome: Progressing Goal: Verbalization of feelings and concerns over difficulty with self-care will improve Outcome: Progressing Goal: Ability to communicate needs accurately will improve Outcome: Progressing   Problem: Nutrition: Goal: Risk of aspiration will decrease Outcome: Progressing   Problem: Skin Integrity: Goal: Risk for impaired skin integrity will decrease Outcome: Progressing

## 2023-05-13 NOTE — Plan of Care (Signed)
   Problem: Ischemic Stroke/TIA Tissue Perfusion: Goal: Complications of ischemic stroke/TIA will be minimized Outcome: Progressing   Problem: Nutrition: Goal: Risk of aspiration will decrease Outcome: Progressing Goal: Dietary intake will improve Outcome: Progressing

## 2023-05-13 NOTE — Assessment & Plan Note (Signed)
 Home levothyroxine 250 mcg daily resumed

## 2023-05-13 NOTE — Assessment & Plan Note (Signed)
 PDMP reviewed.

## 2023-05-13 NOTE — ED Provider Notes (Signed)
 3:13 PM Assumed care for off going team.   Blood pressure (!) 159/87, pulse (!) 58, temperature 97.8 F (36.6 C), resp. rate 19, height 5' (1.524 m), weight 81.6 kg, SpO2 96%.  See their HPI for full report but in brief pending CTA/MRI  Glucose elevated  IMPRESSION: 1. Normal CTA of the head and neck. No evidence of carotid or vertebral artery stenosis. No intracranial large or medium vessel occlusion or correctable stenosis. 2. Mild chronic scarring in the right upper lobe.  Reevaluated patient and updated her on abnormal glucose and workup thus far.  Patient states she does not feel comfortable going home.  She states that she has never had an episode like this before that she reports that she was unable to move her whole body.  We discussed how her mental health has been doing and she denies any concerns.  She does not feel comfortable going home.  We discussed that her MRI is still pending and if this is positive that we would admit her to the hospital but if it was negative she could be cleared to go home.  Patient reports a headache and states that she does not want to be at home and have this happen again.  Therefore I will discuss with hospital team for admission for possible TIA workup.     Lubertha Rush, MD 05/13/23 [phone]

## 2023-05-13 NOTE — Assessment & Plan Note (Signed)
 Home tramadol 50 mg every 6 hours as needed for moderate pain resumed on admission

## 2023-05-13 NOTE — Discharge Instructions (Signed)
 Follow up with your glucose check with primary doctor.

## 2023-05-13 NOTE — Assessment & Plan Note (Signed)
 Stroke-like symptoms MRI of the brain Fasting lipid and A1c ordered Frequent neuro vascular checks PT, OT Fall precaution and aspiration precaution

## 2023-05-13 NOTE — ED Provider Notes (Signed)
 [facility] Provider Note    Event Date/Time   First MD Initiated Contact with Patient 05/13/23 1141     (approximate)   History   Chief Complaint: Altered Mental Status   HPI  Rhonda Palmer is a 53 y.o. female with a history of GERD, schizoaffective disorder, personality disorder, chronic pain who comes to the ED complaining of altered mental status that started around 10:00 AM today.  Prior to this the patient was in her usual state of health.  On arrival, patient is not interactive, will not respond to questioning        Past Medical History:  Diagnosis Date   (HFpEF) heart failure with preserved ejection fraction (HCC)    a. 02/2020 Echo: EF 55-60%, no rwma. Nl RV fxn.   Anxiety    Asthma    Bipolar 1 disorder (HCC)    Diabetes mellitus without complication (HCC)    Edema leg for twenty years per patient   taking lasix for edema   Fibromyalgia    History of cardiac catheterization    a. 02/2020 Cath: Nl cors. LVEDP -> torsemide increased to 40mg  daily.   Hypertension    Hypothyroidism    Morbid obesity (HCC)    Ovarian cyst    Palpitations    a. 03/2020 Zio: RSR, avg HR 84 (55-203), occas SVT runs. No significant arrhythmias.  Most triggered events associate with sinus rhythm.   Schizo affective schizophrenia [facility])     Current Outpatient Rx   Order #: [id number] Class: Historical Med   Order #: [id number] Class: Historical Med   Order #: [id number] Class: Historical Med   Order #: [id number] Class: Historical Med   Order #: [id number] Class: Normal   Order #: [id number] Class: No Print   Order #: [id number] Class: Historical Med   Order #: [id number] Class: Historical Med   Order #: [id number] Class: Historical Med   Order #: [id number] Class: No Print   Order #: [id number] Class: Historical Med    Past Surgical History:  Procedure Laterality Date   ABDOMINAL HYSTERECTOMY     ANKLE SURGERY     pt. states long time ago   LAPAROTOMY N/A  01/05/2022   Procedure: EXPLORATORY LAPAROTOMY WITH REMOVALOF ABDOMINAL ABSCESS;  Surgeon: Sung Amabile, DO;  Location: [facility] ORS;  Service: General;  Laterality: N/A;   LEFT HEART CATH AND CORONARY ANGIOGRAPHY N/A 03/13/2020   Procedure: LEFT HEART CATH AND CORONARY ANGIOGRAPHY;  Surgeon: Yvonne Kendall, MD;  Location: [facility] INVASIVE CV LAB;  Service: Cardiovascular;  Laterality: N/A;    Physical Exam   Triage Vital Signs: ED Triage Vitals  Encounter Vitals Group     BP 05/13/23 1139 (!) 163/94     Systolic BP Percentile --      Diastolic BP Percentile --      Pulse Rate 05/13/23 1139 (!) 53     Resp 05/13/23 1200 (!) 22     Temp 05/13/23 1139 97.8 F (36.6 C)     Temp src --      SpO2 05/13/23 1139 100 %     Weight 05/13/23 1159 179 lb 14.3 oz (81.6 kg)     Height 05/13/23 1159 5' (1.524 m)     Head Circumference --      Peak Flow --      Pain Score 05/13/23 1159 0     Pain Loc --      Pain Education --      Exclude from Growth Chart --  Most recent vital signs: Vitals:   05/13/23 1200 05/13/23 1300  BP: (!) 165/97 (!) 159/87  Pulse: (!) 53 (!) 58  Resp: (!) 22 19  Temp:    SpO2: 95% 96%    General: Awake, no distress.  Follows some simple commands CV:  Good peripheral perfusion.  Regular rate and rhythm, normal distal pulses Resp:  Normal effort.  Clear to auscultation bilaterally Abd:  No distention.  Soft nontender Other:  Dry oral mucosa. Cranial nerves III through XI intact.  Not able to test motor function, sensation, language, dysarthria due to patient's lack of participation in exam.  No current lateralizing neurologic deficits.   ED Results / Procedures / Treatments   Labs (all labs ordered are listed, but only abnormal results are displayed) Labs Reviewed  COMPREHENSIVE METABOLIC PANEL WITH GFR - Abnormal; Notable for the following components:      Result Value   Potassium 3.4 (*)    Glucose, Bld 242 (*)    All other components within normal  limits  CBC WITH DIFFERENTIAL/PLATELET - Abnormal; Notable for the following components:   RBC 5.15 (*)    All other components within normal limits  PHOSPHORUS - Abnormal; Notable for the following components:   Phosphorus 2.3 (*)    All other components within normal limits  URINALYSIS, W/ REFLEX TO CULTURE (INFECTION SUSPECTED) - Abnormal; Notable for the following components:   Color, Urine YELLOW (*)    APPearance HAZY (*)    Bilirubin Urine SMALL (*)    Ketones, ur 5 (*)    Protein, ur 30 (*)    Leukocytes,Ua MODERATE (*)    Bacteria, UA FEW (*)    All other components within normal limits  LIPASE, BLOOD  MAGNESIUM     EKG Interpreted by me Sinus bradycardia rate of 54.  Normal axis and intervals.  Normal QRS and ST segments.  Isolated T wave inversion in V2, nonspecific.   RADIOLOGY CT head interpreted by me, negative for intracranial hemorrhage.  Radiology report reviewed, unremarkable    PROCEDURES:  Procedures   MEDICATIONS ORDERED IN ED: Medications  lactated ringers bolus 1,000 mL (has no administration in time range)  sodium chloride 0.9 % bolus 500 mL (0 mLs Intravenous Stopped 05/13/23 1440)     IMPRESSION / MDM / ASSESSMENT AND PLAN / ED COURSE  I reviewed the triage vital signs and the nursing notes.  DDx: Intracranial mass, intracranial hemorrhage, dehydration, electrolyte derangement, anemia, UTI, intoxication  Patient's presentation is most consistent with acute presentation with potential threat to life or bodily function.  Patient brought to the ED due to altered mental status, no focal neurologic findings, not eligible for code stroke protocol on arrival.  Hemodynamically stable.  Will give IV fluids for hydration, obtain CT head and labs.   Clinical Course as of 05/13/23 1525  Wed May 13, 2023  1456 Pt is awake, alert, conversational. She reports sx have consisted of posterior headache and dizziness which have been continuous since onset.  Normal coordiation and motor function and language. Will order MRI to eval posterior fossa. Pt does note that she is on ozempic and has not been eating due to lack of appetite. She drinks gatorade to try to stay hydrated.  [PS]  1524 Denies dysuria - will defer treating UA abnormality unless culture positive. [PS]    Clinical Course User Index [PS] Sharman Cheek, MD     FINAL CLINICAL IMPRESSION(S) / ED DIAGNOSES   Final diagnoses:  Dizziness  Schizoaffective disorder, unspecified type (HCC)     Rx / DC Orders   ED Discharge Orders     None        Note:  This document was prepared using Dragon voice recognition software and may include unintentional dictation errors.   Jacquie Maudlin, MD 05/13/23 1525

## 2023-05-14 DIAGNOSIS — G894 Chronic pain syndrome: Secondary | ICD-10-CM | POA: Diagnosis present

## 2023-05-14 DIAGNOSIS — K219 Gastro-esophageal reflux disease without esophagitis: Secondary | ICD-10-CM | POA: Diagnosis present

## 2023-05-14 DIAGNOSIS — E119 Type 2 diabetes mellitus without complications: Secondary | ICD-10-CM | POA: Diagnosis present

## 2023-05-14 DIAGNOSIS — Z79899 Other long term (current) drug therapy: Secondary | ICD-10-CM | POA: Diagnosis not present

## 2023-05-14 DIAGNOSIS — F250 Schizoaffective disorder, bipolar type: Secondary | ICD-10-CM | POA: Diagnosis present

## 2023-05-14 DIAGNOSIS — I110 Hypertensive heart disease with heart failure: Secondary | ICD-10-CM | POA: Diagnosis present

## 2023-05-14 DIAGNOSIS — Z7984 Long term (current) use of oral hypoglycemic drugs: Secondary | ICD-10-CM | POA: Diagnosis not present

## 2023-05-14 DIAGNOSIS — Z794 Long term (current) use of insulin: Secondary | ICD-10-CM | POA: Diagnosis not present

## 2023-05-14 DIAGNOSIS — F319 Bipolar disorder, unspecified: Secondary | ICD-10-CM | POA: Diagnosis not present

## 2023-05-14 DIAGNOSIS — E66812 Obesity, class 2: Secondary | ICD-10-CM | POA: Diagnosis present

## 2023-05-14 DIAGNOSIS — J45909 Unspecified asthma, uncomplicated: Secondary | ICD-10-CM | POA: Diagnosis present

## 2023-05-14 DIAGNOSIS — M797 Fibromyalgia: Secondary | ICD-10-CM | POA: Diagnosis present

## 2023-05-14 DIAGNOSIS — Z8249 Family history of ischemic heart disease and other diseases of the circulatory system: Secondary | ICD-10-CM | POA: Diagnosis not present

## 2023-05-14 DIAGNOSIS — E876 Hypokalemia: Secondary | ICD-10-CM

## 2023-05-14 DIAGNOSIS — E039 Hypothyroidism, unspecified: Secondary | ICD-10-CM | POA: Diagnosis present

## 2023-05-14 DIAGNOSIS — F1311 Sedative, hypnotic or anxiolytic abuse, in remission: Secondary | ICD-10-CM | POA: Diagnosis present

## 2023-05-14 DIAGNOSIS — G459 Transient cerebral ischemic attack, unspecified: Secondary | ICD-10-CM | POA: Diagnosis present

## 2023-05-14 DIAGNOSIS — Z79890 Hormone replacement therapy (postmenopausal): Secondary | ICD-10-CM | POA: Diagnosis not present

## 2023-05-14 DIAGNOSIS — F609 Personality disorder, unspecified: Secondary | ICD-10-CM | POA: Diagnosis present

## 2023-05-14 DIAGNOSIS — I5032 Chronic diastolic (congestive) heart failure: Secondary | ICD-10-CM | POA: Diagnosis present

## 2023-05-14 DIAGNOSIS — Z6835 Body mass index (BMI) 35.0-35.9, adult: Secondary | ICD-10-CM | POA: Diagnosis not present

## 2023-05-14 DIAGNOSIS — E782 Mixed hyperlipidemia: Secondary | ICD-10-CM | POA: Diagnosis present

## 2023-05-14 DIAGNOSIS — Z886 Allergy status to analgesic agent status: Secondary | ICD-10-CM | POA: Diagnosis not present

## 2023-05-14 LAB — BASIC METABOLIC PANEL WITH GFR
Anion gap: 7 (ref 5–15)
BUN: 13 mg/dL (ref 6–20)
CO2: 24 mmol/L (ref 22–32)
Calcium: 8.8 mg/dL — ABNORMAL LOW (ref 8.9–10.3)
Chloride: 108 mmol/L (ref 98–111)
Creatinine, Ser: 0.58 mg/dL (ref 0.44–1.00)
GFR, Estimated: >60 mL/min (ref >60)
Glucose, Bld: 88 mg/dL (ref 70–99)
Potassium: 3.1 mmol/L — ABNORMAL LOW (ref 3.5–5.1)
Sodium: 139 mmol/L (ref 135–145)

## 2023-05-14 LAB — URINE CULTURE: Culture: 20000 — AB

## 2023-05-14 LAB — PHOSPHORUS: Phosphorus: 3.1 mg/dL (ref 2.5–4.6)

## 2023-05-14 LAB — LIPID PANEL
Cholesterol: 155 mg/dL (ref 0–200)
HDL: 59 mg/dL (ref >40)
LDL Cholesterol: 80 mg/dL (ref 0–99)
Total CHOL/HDL Ratio: 2.6 ratio
Triglycerides: 78 mg/dL (ref <150)
VLDL: 16 mg/dL (ref 0–40)

## 2023-05-14 LAB — HEMOGLOBIN A1C
Hgb A1c MFr Bld: 5.4 % (ref 4.8–5.6)
Mean Plasma Glucose: 108.28 mg/dL

## 2023-05-14 LAB — GLUCOSE, CAPILLARY
Glucose-Capillary: 86 mg/dL (ref 70–99)
Glucose-Capillary: 95 mg/dL (ref 70–99)
Glucose-Capillary: 134 mg/dL — ABNORMAL HIGH (ref 70–99)
Glucose-Capillary: 83 mg/dL (ref 70–99)

## 2023-05-14 MED ORDER — POTASSIUM CHLORIDE 20 MEQ PO PACK
40.0000 meq | PACK | ORAL | Status: AC
  Administered 2023-05-14 (×3): 40 meq via ORAL
  Filled 2023-05-14 (×3): qty 2

## 2023-05-14 MED ORDER — PROCHLORPERAZINE EDISYLATE 10 MG/2ML IJ SOLN
10.0000 mg | Freq: Four times a day (QID) | INTRAMUSCULAR | Status: DC | PRN
  Administered 2023-05-14 – 2023-05-15 (×3): 10 mg via INTRAVENOUS
  Filled 2023-05-14 (×4): qty 2

## 2023-05-14 MED ORDER — MECLIZINE HCL 25 MG PO TABS
25.0000 mg | ORAL_TABLET | Freq: Three times a day (TID) | ORAL | Status: DC
  Administered 2023-05-14 – 2023-05-15 (×4): 25 mg via ORAL
  Filled 2023-05-14 (×5): qty 1

## 2023-05-14 MED ORDER — ATORVASTATIN CALCIUM 20 MG PO TABS
20.0000 mg | ORAL_TABLET | Freq: Every day | ORAL | Status: DC
  Administered 2023-05-14 – 2023-05-15 (×2): 20 mg via ORAL
  Filled 2023-05-14 (×2): qty 1

## 2023-05-14 MED ORDER — CLOPIDOGREL BISULFATE 75 MG PO TABS
75.0000 mg | ORAL_TABLET | Freq: Every day | ORAL | Status: DC
  Administered 2023-05-14 – 2023-05-15 (×2): 75 mg via ORAL
  Filled 2023-05-14 (×2): qty 1

## 2023-05-14 NOTE — TOC Initial Note (Addendum)
 Transition of Care [facility]) - Initial/Assessment Note    Patient Details  Name: Rhonda Palmer MRN: [id number] Date of Birth: 11-Dec-1970  Transition of [facility]) CM/SW Contact:    Elsie Halo, RN Phone Number: 05/14/2023, 11:38 AM  Clinical Narrative:                  Patient lives at home with her 55 year old daughter. The patient doesn't drive, but utilizes the "Mental Health" driver to take her to and from appointments. The patient recently switched PCPs and reports she has an appointment with [facility] to establish a PCP. She doesn't use any DME.  TOC added list of PCP to DC Summary.  No TOC needs. Please reachout to TOC if needs are identified.     3:00 PM: PT is recommending HH PT/OT and DME RW.  TOC called to [employer] at [phone] for assistance with obtaining services for the patient. Cheryll Corti identified private pay agencies, but no HH agencies in Network with the members plan.  TOC outreached to Jon with [employer] company to order RW.  TOC will continue to follow.   Patient Goals and CMS Choice            Expected Discharge Plan and Services                                              Prior Living Arrangements/Services                       Activities of Daily Living   ADL Screening (condition at time of admission) Independently performs ADLs?: Yes (appropriate for developmental age) Is the patient deaf or have difficulty hearing?: No Does the patient have difficulty seeing, even when wearing glasses/contacts?: No Does the patient have difficulty concentrating, remembering, or making decisions?: No  Permission Sought/Granted                  Emotional Assessment              Admission diagnosis:  TIA (transient ischemic attack) [G45.9] Dizziness [R42] Schizoaffective disorder, unspecified type (HCC) [F25.9] Transient ischemic attack [G45.9] Patient Active Problem List   Diagnosis Date Noted   Transient ischemic attack  05/14/2023   TIA (transient ischemic attack) 05/13/2023   Hypophosphatemia 05/13/2023   Morbid obesity (HCC) 05/13/2023   Headache 05/13/2023   Herniated nucleus pulposus, L4-5 (Right) 12/23/2022   Chronic low back pain (1ry area of Pain) (Bilateral) w/ sciatica (Right) 11/24/2022   Chronic lower extremity pain (2ry area of Pain) (Right) 11/24/2022   Tailbone injury, sequela 11/24/2022   Lumbosacral radiculopathy at L5 (Right) 11/24/2022   Lumbosacral radiculopathy at S1 (Right) 11/24/2022   Chronic radicular pain of lower extremity (Right) 11/24/2022   Protrusion of lumbar intervertebral disc (L4-5, L5-S1) 11/24/2022   Pharmacologic therapy 11/20/2022   Disorder of skeletal system 11/20/2022   Problems influencing health status 11/20/2022   Abnormal MRI, lumbar spine (07/24/2022) 11/20/2022   History of gastric bypass 11/20/2022   Nausea and vomiting 01/04/2022   Acute kidney failure (HCC) 01/02/2022   Acute kidney injury (HCC) 01/01/2022   Acute gastroenteritis 01/01/2022   Confusion    Weakness    Opioid withdrawal (HCC) 01/13/2021   Acute metabolic encephalopathy 01/11/2021   Abdominal pain 01/11/2021   Obesity, Class III, BMI 40-49.9 (morbid  obesity) (HCC) 01/11/2021   Prolonged QT interval 01/11/2021   Diarrhea 01/11/2021   Hypoxemia 12/05/2020   Acute on chronic respiratory failure with hypoxemia (HCC) 12/04/2020   Depression with anxiety 05/16/2020   Hypertension 05/16/2020   Dyspnea on exertion 05/16/2020   Unstable angina (HCC)    Chest pain 03/11/2020   Normocytic anemia 03/11/2020   Bipolar 1 disorder (HCC)    HLD (hyperlipidemia)    Morbid obesity with BMI of 60.0-69.9, adult (HCC)    Acute respiratory failure with hypoxia (HCC)    Obesity (BMI 35.0-39.9 without comorbidity) 09/05/2016   Diabetes mellitus type 2, uncomplicated (HCC) 08/13/2016   Long term (current) use of opiate analgesic 06/26/2016   Long term prescription opiate use 06/26/2016   Obesity  01/17/2016   History of opioid abuse (HCC) 01/17/2016   Asthma 01/17/2016   Mixed hyperlipidemia 10/09/2015   Hypokalemia 10/09/2015   Headache above the eye region 06/16/2013   Localized edema 05/18/2013   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) 04/12/2013   GERD (gastroesophageal reflux disease) 04/11/2013   Personality disorder (HCC) 10/16/2012   Chronic pain syndrome 06/18/2012   Schizoaffective disorder, bipolar type (HCC) 02/20/2012   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) 04/01/2010   Traumatic arthropathy, unspecified ankle and foot 12/16/2007   Hypothyroidism 11/16/2002   PCP:  Sharyne Degree, FNP Pharmacy:   [facility] - [place], [place] - [address] [place] [address] Phone: [phone] Fax: [phone]     Social Drivers of Health (SDOH) Social History: SDOH Screenings   Food Insecurity: Patient Declined (05/13/2023)  Housing: Patient Declined (05/13/2023)  Transportation Needs: Unmet Transportation Needs (05/13/2023)  Utilities: Not At Risk (05/13/2023)  Alcohol Screen: Low Risk  (11/24/2022)  Depression (PHQ2-9): Low Risk  (11/24/2022)  Financial Resource Strain: Low Risk  (02/26/2023)   Received from [facility]  Social Connections: Socially Isolated (05/13/2023)  Tobacco Use: Low Risk  (05/13/2023)   SDOH Interventions:     Readmission Risk Interventions    01/13/2022    9:50 AM 01/07/2022    2:24 PM 01/15/2021    3:47 PM  Readmission Risk Prevention Plan  Transportation Screening Complete  Complete  PCP or Specialist Appt within 3-5 Days   Complete  HRI or Home Care Consult Complete --   Social Work Consult for Recovery Care Planning/Counseling Complete Complete Complete  Palliative Care Screening Not Applicable Not Applicable Not Applicable  Medication Review Oceanographer) Complete Complete Complete

## 2023-05-14 NOTE — Evaluation (Signed)
 Physical Therapy Evaluation Patient Details Name: Rhonda Palmer MRN: [id number] DOB: 08/31/1970 Today's Date: 05/14/2023  History of Present Illness  Pt is a 53 year old female who presents to ED for chief concerns of not verbally responsive. MRI negative. 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  Clinical Impression  Pt is a pleasant 53 year old female who was admitted for TIA. Pt performs bed mobility with mod I, transfers with supervision, and ambulation with cga and RW. Pt demonstrates deficits with mobility/strength. Pt is complaining of dizziness, appears to be related to vertigo, reports increased dizziness with head turning L. Pt with urgent need to toilet and then friend came at bedside. Will continue to monitor and plan to perform BPPV testing next session. Does need to use RW for all mobility. Would benefit from skilled PT to address above deficits and promote optimal return to PLOF. Pt will continue to receive skilled PT services while admitted and will defer to TOC/care team for updates regarding disposition planning.       If plan is discharge home, recommend the following: A little help with walking and/or transfers;Assist for transportation;Help with stairs or ramp for entrance   Can travel by private vehicle        Equipment Recommendations Rolling walker (2 wheels)  Recommendations for Other Services       Functional Status Assessment Patient has had a recent decline in their functional status and demonstrates the ability to make significant improvements in function in a reasonable and predictable amount of time.     Precautions / Restrictions Precautions Precautions: Fall Recall of Precautions/Restrictions: Intact Restrictions Weight Bearing Restrictions Per Provider Order: No       Mobility  Bed Mobility Overal bed mobility: Modified Independent             General bed mobility comments: increased time/effort    Transfers Overall transfer level: Needs assistance Equipment used: Rolling walker (2 wheels) Transfers: Sit to/from Stand, Bed to chair/wheelchair/BSC Sit to Stand: Supervision, Contact guard assist           General transfer comment: safe technique with no dizziness reported while changing positions. RW used    Ambulation/Gait Ambulation/Gait assistance: Contact guard assist Gait Distance (Feet): 80 Feet Assistive device: Rolling walker (2 wheels) Gait Pattern/deviations: Step-through pattern       General Gait Details: ambulated in hallway using RW initially and then no AD. Pt needs min assist without device. Reciprocal gait pattern performed  Stairs            Wheelchair Mobility     Tilt Bed    Modified Rankin (Stroke Patients Only)       Balance Overall balance assessment: Needs assistance Sitting-balance support: Feet supported Sitting balance-Leahy Scale: Good Sitting balance - Comments: steady during LB ADLs     Standing balance-Leahy Scale: Poor                               Pertinent Vitals/Pain Pain Assessment Pain Assessment: Faces Faces Pain Scale: Hurts a little bit Pain Location: back of her head Pain Descriptors / Indicators: Aching, Headache Pain Intervention(s): Limited activity within patient's tolerance, Patient requesting pain meds-RN notified    Home Living Family/patient expects to be discharged to:: Private residence Living Arrangements: Children Available Help at Discharge: Family;Available PRN/intermittently Type of Home:  Apartment Home Access: Level entry       Home Layout: One level Home Equipment: Grab bars - tub/shower      Prior Function Prior Level of Function : Independent/Modified Independent             Mobility Comments: denies falls, IND  without AD use ADLs Comments: IND with ADL/IADL, has neighbors drive her to grocery store, but pt able to grocery shop on her own. required to lean on grocery cart.     Extremity/Trunk Assessment   Upper Extremity Assessment Upper Extremity Assessment: Generalized weakness    Lower Extremity Assessment Lower Extremity Assessment: Generalized weakness (L LE grossly 4/5)       Communication   Communication Communication: No apparent difficulties    Cognition Arousal: Alert Behavior During Therapy: WFL for tasks assessed/performed   PT - Cognitive impairments: No apparent impairments                       PT - Cognition Comments: agreeable to session Following commands: Intact       Cueing Cueing Techniques: Verbal cues     General Comments      Exercises Other Exercises Other Exercises: ambulated to bathroom, able to perform transfers and hygiene with safe technique.   Assessment/Plan    PT Assessment Patient needs continued PT services  PT Problem List Decreased strength;Decreased activity tolerance;Decreased balance;Decreased mobility       PT Treatment Interventions Gait training;DME instruction;Therapeutic exercise;Balance training    PT Goals (Current goals can be found in the Care Plan section)  Acute Rehab PT Goals Patient Stated Goal: to get out of the hospital PT Goal Formulation: With patient Time For Goal Achievement: 05/28/23 Potential to Achieve Goals: Good    Frequency Min 2X/week     Co-evaluation               AM-PAC PT "6 Clicks" Mobility  Outcome Measure Help needed turning from your back to your side while in a flat bed without using bedrails?: None Help needed moving from lying on your back to sitting on the side of a flat bed without using bedrails?: None Help needed moving to and from a bed to a chair (including a wheelchair)?: A Little Help needed standing up from a chair using your arms (e.g., wheelchair or  bedside chair)?: A Little Help needed to walk in hospital room?: A Little Help needed climbing 3-5 steps with a railing? : A Lot 6 Click Score: 19    End of Session Equipment Utilized During Treatment: Gait belt Activity Tolerance: Patient tolerated treatment well Patient left: in bed;with bed alarm set (best friend at bedside) Nurse Communication: Mobility status PT Visit Diagnosis: Unsteadiness on feet (R26.81);Muscle weakness (generalized) (M62.81);Difficulty in walking, not elsewhere classified (R26.2)    Time: 1191-4782 PT Time Calculation (min) (ACUTE ONLY): 17 min   Charges:   PT Evaluation $PT Eval Low Complexity: 1 Low PT Treatments $Therapeutic Activity: 8-22 mins PT General Charges $$ ACUTE PT VISIT: 1 Visit         Amparo Balk, PT, DPT, GCS [phone]   Ridley Schewe 05/14/2023, 4:35 PM

## 2023-05-14 NOTE — Consult Note (Signed)
 NEUROLOGY CONSULT NOTE   Date of service: May 14, 2023 Patient Name: Rhonda Palmer MRN:  [id number] DOB:  1970-12-29 Chief Complaint: "concern for stroke vs TIA" Requesting Provider: Marrion Coy, MD  History of Present Illness  Rhonda Palmer is a 53 y.o. female with hx of heart failure, bipolar 1, diabetes, hypertension, obesity who presents with sudden onset episode of vertigo, generalized weakness and difficulty with speech.  Patient reports that around 9:30 in the morning on 05/13/2023, she was sitting in the couch when she had sudden onset posterior headache along with vertigo and felt very unstable.  She was cold and clammy and she felt like she had difficulty with moving her arms and her legs.  This possibly could have been secondary to intense vertigo.  She also felt that she was nauseous and she could not talk.  This was persistent for about 2 hours and then resolved.  She came into the ED and was admitted for stroke workup.  CT head without contrast with no acute intracranial normalities.  CT angio the head and neck with no significant stenosis or large vessel occlusion.  MRI of the brain does not demonstrate an acute stroke.  Neurology was consulted further evaluation workup.  She feels that headache is significantly improved.  She denies any prior history of similar episodes.   She still feels that her left side is numb slightly compared to the right.  LKW: 0930 on 05/13/23 Modified rankin score: 1-No significant post stroke disability and can perform usual duties with stroke symptoms IV Thrombolysis: Not offered due to spontaneous resolution of symptoms and outside the window.   EVT: Not offered, no LVO and spontaneous resolution of symptoms.    NIHSS components Score: Comment  1a Level of Conscious 0[x]  1[]  2[]  3[]      1b LOC Questions 0[x]  1[]  2[]       1c LOC Commands 0[x]  1[]  2[]       2 Best Gaze 0[x]  1[]  2[]       3 Visual 0[x]  1[]  2[]  3[]      4 Facial Palsy 0[x]   1[]  2[]  3[]      5a Motor Arm - left 0[x]  1[]  2[]  3[]  4[]  UN[]    5b Motor Arm - Right 0[x]  1[]  2[]  3[]  4[]  UN[]    6a Motor Leg - Left 0[]  1[x]  2[]  3[]  4[]  UN[]    6b Motor Leg - Right 0[]  1[x]  2[]  3[]  4[]  UN[]    7 Limb Ataxia 0[x]  1[]  2[]  3[]  UN[]     8 Sensory 0[]  1[x]  2[]  UN[]      9 Best Language 0[x]  1[]  2[]  3[]      10 Dysarthria 0[x]  1[]  2[]  UN[]      11 Extinct. and Inattention 0[x]  1[]  2[]       TOTAL: 2      ROS  Comprehensive ROS performed and pertinent positives documented in HPI   Past History   Past Medical History:  Diagnosis Date   (HFpEF) heart failure with preserved ejection fraction (HCC)    a. 02/2020 Echo: EF 55-60%, no rwma. Nl RV fxn.   Anxiety    Asthma    Bipolar 1 disorder (HCC)    Diabetes mellitus without complication (HCC)    Edema leg for twenty years per patient   taking lasix for edema   Fibromyalgia    History of cardiac catheterization    a. 02/2020 Cath: Nl cors. LVEDP -> torsemide increased to 40mg  daily.   Hypertension    Hypothyroidism  Morbid obesity (HCC)    Ovarian cyst    Palpitations    a. 03/2020 Zio: RSR, avg HR 84 (55-203), occas SVT runs. No significant arrhythmias.  Most triggered events associate with sinus rhythm.   Schizo affective schizophrenia [facility])     Past Surgical History:  Procedure Laterality Date   ABDOMINAL HYSTERECTOMY     ANKLE SURGERY     pt. states long time ago   LAPAROTOMY N/A 01/05/2022   Procedure: EXPLORATORY LAPAROTOMY WITH REMOVALOF ABDOMINAL ABSCESS;  Surgeon: Sung Amabile, DO;  Location: [facility] ORS;  Service: General;  Laterality: N/A;   LEFT HEART CATH AND CORONARY ANGIOGRAPHY N/A 03/13/2020   Procedure: LEFT HEART CATH AND CORONARY ANGIOGRAPHY;  Surgeon: Yvonne Kendall, MD;  Location: [facility] INVASIVE CV LAB;  Service: Cardiovascular;  Laterality: N/A;    Family History: Family History  Problem Relation Age of Onset   Heart failure Mother    Bladder Cancer Neg Hx    Kidney cancer Neg Hx      Social History  reports that she has never smoked. She has never used smokeless tobacco. She reports that she does not drink alcohol and does not use drugs.  Allergies  Allergen Reactions   Codeine Hives, Itching, Swelling and Rash    Takes oxycodone, morphine    Sulfa Antibiotics Anaphylaxis   Penicillins Itching    TOLERATED CEFAZOLIN   Aspirin Rash    Medications   Current Facility-Administered Medications:    acetaminophen (TYLENOL) tablet 650 mg, 650 mg, Oral, Q4H PRN, 650 mg at 05/14/23 1152 **OR** acetaminophen (TYLENOL) 160 MG/5ML solution 650 mg, 650 mg, Per Tube, Q4H PRN **OR** acetaminophen (TYLENOL) suppository 650 mg, 650 mg, Rectal, Q4H PRN, Cox, Amy N, DO   clonazePAM (KLONOPIN) tablet 0.5 mg, 0.5 mg, Oral, BID PRN, Cox, Amy N, DO, 0.5 mg at 05/14/23 0907   heparin injection 5,000 Units, 5,000 Units, Subcutaneous, Q8H, Cox, Amy N, DO, 5,000 Units at 05/14/23 0556   hydrALAZINE (APRESOLINE) injection 5 mg, 5 mg, Intravenous, Q6H PRN, Cox, Amy N, DO   insulin aspart (novoLOG) injection 0-15 Units, 0-15 Units, Subcutaneous, TID WC, Cox, Amy N, DO   insulin aspart (novoLOG) injection 0-5 Units, 0-5 Units, Subcutaneous, QHS, Cox, Amy N, DO   lamoTRIgine (LAMICTAL) tablet 200 mg, 200 mg, Oral, Daily, Cox, Amy N, DO, 200 mg at 05/14/23 0907   levothyroxine (SYNTHROID) tablet 250 mcg, 250 mcg, Oral, Q0600, Cox, Amy N, DO, 250 mcg at 05/14/23 0555   meclizine (ANTIVERT) tablet 25 mg, 25 mg, Oral, TID, Marrion Coy, MD, 25 mg at 05/14/23 0924   potassium chloride (KLOR-CON) packet 40 mEq, 40 mEq, Oral, Q2H, Marrion Coy, MD, 40 mEq at 05/14/23 1152   prochlorperazine (COMPAZINE) injection 10 mg, 10 mg, Intravenous, Q6H PRN, Andris Baumann, MD, 10 mg at 05/14/23 1025   QUEtiapine (SEROQUEL) tablet 350 mg, 350 mg, Oral, QHS, Cox, Amy N, DO, 350 mg at 05/13/23 2038   senna-docusate (Senokot-S) tablet 1 tablet, 1 tablet, Oral, QHS PRN, Cox, Amy N, DO   traMADol (ULTRAM) tablet 50  mg, 50 mg, Oral, Q6H PRN, Cox, Amy N, DO, 50 mg at 05/14/23 0555  Vitals   Vitals:   05/14/23 0030 05/14/23 0343 05/14/23 0733 05/14/23 1135  BP: 136/74 112/78 135/82 (!) 126/91  Pulse: 64 (!) 58 61 69  Resp: 18 16 16 18   Temp: 98.3 F (36.8 C) 98.1 F (36.7 C) 97.9 F (36.6 C) 98.6 F (37 C)  TempSrc: Oral  Oral Oral   SpO2: 97% 94% 99% 96%  Weight:      Height:        Body mass index is 35.13 kg/m.  Physical Exam   General: Laying comfortably in bed; in no acute distress.  HENT: Normal oropharynx and mucosa. Normal external appearance of ears and nose.  Neck: Supple, no pain or tenderness  CV: No JVD. No peripheral edema.  Pulmonary: Symmetric Chest rise. Normal respiratory effort.  Abdomen: Soft to touch, non-tender.  Ext: No cyanosis, edema, or deformity  Skin: No rash. Normal palpation of skin.   Musculoskeletal: Normal digits and nails by inspection. No clubbing.   Neurologic Examination  Mental status/Cognition: Alert, oriented to self, place, month and year, good attention.  Speech/language: Fluent, comprehension intact, object naming intact, repetition intact.  Cranial nerves:   CN II Pupils equal and reactive to light, no VF deficits    CN III,IV,VI EOM intact, no gaze preference or deviation, no nystagmus    CN V normal sensation in V1, V2, and V3 segments bilaterally    CN VII no asymmetry, no nasolabial fold flattening    CN VIII normal hearing to speech    CN IX & X normal palatal elevation, no uvular deviation    CN XI 5/5 head turn and 5/5 shoulder shrug bilaterally    CN XII midline tongue protrusion    Motor:  Muscle bulk: Poor, tone normal, slight pronator drift noted in left upper extremity.  She has a pill-rolling tremor. Mvmt Root Nerve  Muscle Right Left Comments  SA C5/6 Ax Deltoid 4+ 4+   EF C5/6 Mc Biceps 4+ 4+   EE C6/7/8 Rad Triceps 4+ 4+   WF C6/7 Med FCR     WE C7/8 PIN ECU     F Ab C8/T1 U ADM/FDI 5 5   HF L1/2/3 Fem Illopsoas 4 4    KE L2/3/4 Fem Quad 4 4   DF L4/5 D Peron Tib Ant 5 5   PF S1/2 Tibial Grc/Sol 5 5    Sensation:  Light touch Reports slightly decreased in left upper extremity and left face compared to the right.   Pin prick    Temperature    Vibration   Proprioception    Coordination/Complex Motor:  - Finger to Nose intact bilaterally - Heel to shin difficulty due to body habitus but no obvious ataxia. - Rapid alternating movement are slowed. - Gait: Deferred for safety. Labs/Imaging/Neurodiagnostic studies   CBC:  Recent Labs  Lab 05/26/2023 1148  WBC 9.9  NEUTROABS 6.6  HGB 14.4  HCT 44.9  MCV 87.2  PLT 211   Basic Metabolic Panel:  Lab Results  Component Value Date   NA 139 05/14/2023   K 3.1 (L) 05/14/2023   CO2 24 05/14/2023   GLUCOSE 88 05/14/2023   BUN 13 05/14/2023   CREATININE 0.58 05/14/2023   CALCIUM 8.8 (L) 05/14/2023   GFRNONAA >60 05/14/2023   GFRAA >60 12/04/2018   Lipid Panel:  Lab Results  Component Value Date   LDLCALC 80 05/14/2023   HgbA1c:  Lab Results  Component Value Date   HGBA1C 5.4 05/14/2023   Urine Drug Screen:     Component Value Date/Time   LABOPIA NONE DETECTED May 26, 2023 1147   COCAINSCRNUR NONE DETECTED May 26, 2023 1147   LABBENZ NONE DETECTED May 26, 2023 1147   AMPHETMU NONE DETECTED 2023/05/26 1147   THCU NONE DETECTED 26-May-2023 1147   LABBARB NONE DETECTED 05-26-23 1147  Alcohol Level No results found for: "ETH" INR No results found for: "INR" APTT No results found for: "APTT" AED levels: No results found for: "PHENYTOIN", "ZONISAMIDE", "LAMOTRIGINE", "LEVETIRACETA"  CT Head without contrast(Personally reviewed): CTH was negative for a large hypodensity concerning for a large territory infarct or hyperdensity concerning for an ICH  CT angio Head and Neck with contrast(Personally reviewed): No LVO, no significant stenosis.  MRI Brain(Personally reviewed): No acute stroke  ASSESSMENT   Rhonda Palmer is a 53 y.o.  female with hx of heart failure, bipolar 1, diabetes, hypertension, obesity who presents with sudden onset episode of vertigo, generalized weakness and difficulty with speech.  Episode spontaneously resolved.  Neuroexam is notable for slight left upper extremity pronator drift and slightly reduced sensation to touch in left upper extremity and left face.  Episode is concerning for a minor ischemic stroke/TIA in the posterior circulation.  Stroke workup so far with CT head, CTA and MRI of the brain with no stroke, no LVO, no significant stenosis.  RECOMMENDATIONS  - Frequent Neuro checks per stroke unit protocol - Recommend obtaining TTE  - LDL is elevated to 80.  Started on atorvastatin 20 mg daily. - Recommend HbA1c to evaluate for diabetes and how well it is controlled. - Antithrombotic -she is allergic to aspirin, I will do Plavix 75 mg daily. - Recommend DVT ppx - SBP goal -she is just past the 24-hour window for permissive hypertension, I think he can aim for gradual normotension now. - Recommend Telemetry monitoring for arrythmia - Recommend bedside swallow screen prior to PO intake. - Stroke education booklet - Recommend PT/OT/SLP consult  ______________________________________________________________________   Signed, Amir Glaus, MD [facility]

## 2023-05-14 NOTE — Evaluation (Signed)
 Occupational Therapy Evaluation Patient Details Name: Rhonda Palmer MRN: [id number] DOB: [birth year] Today's Date: 05/14/2023   History of Present Illness   Pt is a 53 year old female who presents to ED for chief concerns of not verbally responsive. MRI negative. 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        Clinical Impressions Pt was seen for OT evaluation this date. Prior to hospital admission, pt was living in a ground level apartment with her 61 year old daughter. Reports she was IND with ADLs, IADLs and mobility without AD use. Does not drive, but has neighbors or transportation services to take her to grocery store.  Pt presents to acute OT demonstrating impaired ADL performance and functional mobility 2/2 weakness, balance deficits, and pain. Reports the back of her head hurts and she still has dizziness. Feels like she leans to the R side when up walking and reports baseline LUE tremors, and new R sided tremors that are worse. Denies fall, but reports her daughter prevented her from falling at home prior to coming in. Pt currently requires SUP for bed mobility with increased time/effort. SBA for STS from EOB and CGA for SPT and mobility in the room via 1 HHA, however pt reaching for bed rail and counter. Able to stand with BUE support on sink counter to perform oral care with CGA. Provided RW to ambulate back to chair with progression to SBA.  Pt would benefit from skilled OT services to address noted impairments and functional limitations to maximize safety and independence while minimizing falls risk and caregiver burden. Do anticipate the need for follow up OT services upon acute hospital DC.      If plan is discharge home, recommend the following:   A little help with walking and/or transfers;A little  help with bathing/dressing/bathroom;Help with stairs or ramp for entrance;Assistance with cooking/housework     Functional Status Assessment   Patient has had a recent decline in their functional status and demonstrates the ability to make significant improvements in function in a reasonable and predictable amount of time.     Equipment Recommendations   Other (comment) (RW)     Recommendations for Other Services         Precautions/Restrictions   Precautions Precautions: Fall Restrictions Weight Bearing Restrictions Per Provider Order: No     Mobility Bed Mobility Overal bed mobility: Modified Independent             General bed mobility comments: increased time/effort    Transfers Overall transfer level: Needs assistance Equipment used: Rolling walker (2 wheels) Transfers: Sit to/from Stand, Bed to chair/wheelchair/BSC Sit to Stand: Supervision, Contact guard assist     Step pivot transfers: Contact guard assist     General transfer comment: CGA for mobility without AD progressed to SBA with RW      Balance Overall balance assessment: Needs assistance Sitting-balance support: Feet supported Sitting balance-Leahy Scale: Good Sitting balance - Comments: steady during LB ADLs   Standing balance support: Single extremity supported, During functional activity Standing balance-Leahy Scale: Poor Standing balance comment: needs RW for best stability; HHA CGA and reaching for counter/bed rail                           ADL either performed or assessed with clinical judgement   ADL Overall ADL's : Needs assistance/impaired  Grooming: Oral care;Standing;Contact guard assist               Lower Body Dressing: Moderate assistance Lower Body Dressing Details (indicate cue type and reason): don/doff socks             Functional mobility during ADLs: Contact guard assist;Rolling walker (2 wheels);Supervision/safety General ADL  Comments: CGA with HHA and pt reaching for objects in the room for stability, SBA with RW use     Vision         Perception         Praxis         Pertinent Vitals/Pain Pain Assessment Pain Assessment: 0-10 Pain Score: 6  Pain Location: back of her head Pain Descriptors / Indicators: Aching, Headache Pain Intervention(s): Monitored during session, Repositioned, Patient requesting pain meds-RN notified     Extremity/Trunk Assessment Upper Extremity Assessment Upper Extremity Assessment: Generalized weakness;Right hand dominant (BUE tremors-L sided is baseline, R sided are new)   Lower Extremity Assessment Lower Extremity Assessment: Generalized weakness       Communication Communication Communication: No apparent difficulties   Cognition Arousal: Alert Behavior During Therapy: WFL for tasks assessed/performed                                 Following commands: Intact       Cueing  General Comments   Cueing Techniques: Verbal cues  reports 5/10 headache and dizziness; BPs and HR were stable   Exercises Other Exercises Other Exercises: Edu on role of OT in acute setting.   Shoulder Instructions      Home Living Family/patient expects to be discharged to:: Private residence Living Arrangements: Children (31 year old daughter) Available Help at Discharge: Family;Available PRN/intermittently Type of Home: Apartment Home Access: Level entry     Home Layout: One level     Bathroom Shower/Tub: Chief Strategy Officer: Standard     Home Equipment: Grab bars - tub/shower          Prior Functioning/Environment Prior Level of Function : Independent/Modified Independent             Mobility Comments: denies falls, IND without AD use ADLs Comments: IND with ADL/IADL, has neighbors drive her to grocery store, but pt able to grocery shop on her own    OT Problem List: Decreased strength;Impaired balance (sitting and/or  standing);Decreased coordination   OT Treatment/Interventions: Self-care/ADL training;Therapeutic exercise;Therapeutic activities;Patient/family education;DME and/or AE instruction;Balance training      OT Goals(Current goals can be found in the care plan section)   Acute Rehab OT Goals Patient Stated Goal: return home OT Goal Formulation: With patient Time For Goal Achievement: 05/28/23 Potential to Achieve Goals: Fair ADL Goals Pt Will Perform Lower Body Bathing: with supervision;with modified independence;sit to/from stand;sitting/lateral leans Pt Will Perform Lower Body Dressing: with supervision;with modified independence;sit to/from stand;sitting/lateral leans Pt Will Transfer to Toilet: with supervision;with modified independence;regular height toilet;ambulating Pt Will Perform Toileting - Clothing Manipulation and hygiene: with supervision;with modified independence;sitting/lateral leans;sit to/from stand   OT Frequency:  Min 2X/week    Co-evaluation              AM-PAC OT "6 Clicks" Daily Activity     Outcome Measure Help from another person eating meals?: None Help from another person taking care of personal grooming?: None Help from another person toileting, which includes using toliet, bedpan, or urinal?: A Little  Help from another person bathing (including washing, rinsing, drying)?: A Little Help from another person to put on and taking off regular upper body clothing?: None Help from another person to put on and taking off regular lower body clothing?: A Little 6 Click Score: 21   End of Session Equipment Utilized During Treatment: Rolling walker (2 wheels) Nurse Communication: Mobility status  Activity Tolerance: Patient tolerated treatment well Patient left: in chair;with call bell/phone within reach;with chair alarm set  OT Visit Diagnosis: Other abnormalities of gait and mobility (R26.89);Unsteadiness on feet (R26.81)                Time:  5188-4166 OT Time Calculation (min): 35 min Charges:  OT General Charges $OT Visit: 1 Visit OT Evaluation $OT Eval Moderate Complexity: 1 Mod OT Treatments $Self Care/Home Management : 8-22 mins  Erienne Spelman, OTR/L 05/14/23, 12:39 PM  Aluel Schwarz E Patrick Sohm 05/14/2023, 12:34 PM

## 2023-05-14 NOTE — Progress Notes (Signed)
 Progress Note   Patient: Rhonda Palmer ZOX:[id number] DOB: Feb 20, 1970 DOA: 05/13/2023     0 DOS: the patient was seen and examined on 05/14/2023   Brief hospital course: Ms. Jakia Kennebrew is a 53 year old female with history of neuropathy, non-insulin-dependent diabetes mellitus, hypothyroid, anxiety, morbid obesity, history of chronic bilateral lower back pain with right-sided sciatica, chronic pain requiring outpatient chronic pain management, who presents emergency department for chief concerns of not verbally responsive. She reported a sudden onset of headache in the lower posterior head, severe vertigo, than started vomiting. In the ambulance, she could not talk.  Symptoms lasted for 2 hours, mostly resolved. But still feels dizzy with vertigo, unsteady gait.  At baseline, she exercises regularly.  MRI brain and CT angio of head and neck are negative.     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 Vertigo. Stroke workup including MRI of the brain, CT angiogram of the head and neck did not show any significant changes. Etiology of symptoms unclear, pending neurology consult. Patient has some unsteady gait, obtain PT/OT. Start meclizine for vertigo.  Hypertension Continue to follow.  Hypothyroidism Home levothyroxine 250 mcg daily resumed  Diabetes mellitus type 2, uncomplicated (HCC) Continue sliding scale insulin.  Morbid obesity  (HCC) Class II obesity with comorbidities.  BMI 35.13. This complicates overall care and prognosis.   Hypophosphatemia Hypokalemia. Spaete normalized, continue replete potassium.  Recheck levels tomorrow  Bipolar 1 disorder (HCC) Medications were reviewed with inpatient pharmacy service over the phone Home quetiapine 350 mg nightly, Lamictal 200 mg daily, were resumed on admission  Chronic pain syndrome Home tramadol 50 mg every 6 hours as needed for moderate pain resumed on admission  History of opioid abuse (HCC) PDMP reviewed       Subjective:  Patient still feels dizzy and vertigo, unsteady gait.  No nausea vomiting or diarrhea.  Physical Exam: Vitals:   05/13/23 2023 05/14/23 0030 05/14/23 0343 05/14/23 0733  BP: (!) 125/90 136/74 112/78 135/82  Pulse: 65 64 (!) 58 61  Resp: 15 18 16 16   Temp: 98.7 F (37.1 C) 98.3 F (36.8 C) 98.1 F (36.7 C) 97.9 F (36.6 C)  TempSrc: Oral Oral Oral Oral  SpO2: 96% 97% 94% 99%  Weight:      Height:       General exam: Appears calm and comfortable  Respiratory system: Clear to auscultation. Respiratory effort normal. Cardiovascular system: S1 & S2 heard, RRR. No JVD, murmurs, rubs, gallops or clicks. No pedal edema. Gastrointestinal system: Abdomen is nondistended, soft and nontender. No organomegaly or masses felt. Normal bowel sounds heard. Central nervous system: Alert and oriented. No focal neurological deficits. Extremities: Symmetric 5 x 5 power. Skin: No rashes, lesions or ulcers Psychiatry: Judgement and insight appear normal. Mood & affect appropriate. '  Data Reviewed:  Reviewed CT scan results, results, lab results.  Family Communication: None  Disposition: Status is: Observation      Time spent:  50 minutes  Author: Donaciano Frizzle, MD 05/14/2023 9:23 AM  For on call review [email or web address].

## 2023-05-14 NOTE — Plan of Care (Signed)
  Problem: Coping: Goal: Will verbalize positive feelings about self Outcome: Progressing Goal: Will identify appropriate support needs Outcome: Progressing   Problem: Health Behavior/Discharge Planning: Goal: Goals will be collaboratively established with patient/family Outcome: Progressing   Problem: Self-Care: Goal: Ability to participate in self-care as condition permits will improve Outcome: Progressing Goal: Verbalization of feelings and concerns over difficulty with self-care will improve Outcome: Progressing Goal: Ability to communicate needs accurately will improve Outcome: Progressing   Problem: Nutrition: Goal: Risk of aspiration will decrease Outcome: Progressing Goal: Dietary intake will improve Outcome: Progressing   Problem: Coping: Goal: Ability to adjust to condition or change in health will improve Outcome: Progressing   Problem: Health Behavior/Discharge Planning: Goal: Ability to identify and utilize available resources and services will improve Outcome: Progressing   Problem: Skin Integrity: Goal: Risk for impaired skin integrity will decrease Outcome: Progressing

## 2023-05-15 ENCOUNTER — Inpatient Hospital Stay (HOSPITAL_COMMUNITY)
Admit: 2023-05-15 | Discharge: 2023-05-15 | Disposition: A | Discharge status: Home / Self Care | Payer: MEDICAID | Attending: Internal Medicine | Admitting: Internal Medicine

## 2023-05-15 ENCOUNTER — Other Ambulatory Visit: Payer: Self-pay

## 2023-05-15 DIAGNOSIS — G459 Transient cerebral ischemic attack, unspecified: Secondary | ICD-10-CM | POA: Diagnosis not present

## 2023-05-15 DIAGNOSIS — E876 Hypokalemia: Secondary | ICD-10-CM | POA: Diagnosis not present

## 2023-05-15 DIAGNOSIS — F319 Bipolar disorder, unspecified: Secondary | ICD-10-CM | POA: Diagnosis not present

## 2023-05-15 LAB — BASIC METABOLIC PANEL WITH GFR
Anion gap: 7 (ref 5–15)
BUN: 15 mg/dL (ref 6–20)
CO2: 24 mmol/L (ref 22–32)
Calcium: 8.9 mg/dL (ref 8.9–10.3)
Chloride: 109 mmol/L (ref 98–111)
Creatinine, Ser: 0.55 mg/dL (ref 0.44–1.00)
GFR, Estimated: >60 mL/min (ref >60)
Glucose, Bld: 87 mg/dL (ref 70–99)
Potassium: 3.5 mmol/L (ref 3.5–5.1)
Sodium: 140 mmol/L (ref 135–145)

## 2023-05-15 LAB — ECHOCARDIOGRAM COMPLETE
AR max vel: 3.7 cm2
AV Area VTI: 4.6 cm2
AV Area mean vel: 3.94 cm2
AV Mean grad: 3 mmHg
AV Peak grad: 5.2 mmHg
Ao pk vel: 1.14 m/s
Area-P 1/2: 3.02 cm2
Height: 60 in
MV VTI: 3.95 cm2
S' Lateral: 2.6 cm
Weight: 2878.33 [oz_av]

## 2023-05-15 LAB — GLUCOSE, CAPILLARY
Glucose-Capillary: 94 mg/dL (ref 70–99)
Glucose-Capillary: 87 mg/dL (ref 70–99)

## 2023-05-15 LAB — MISC LABCORP TEST (SEND OUT): Labcorp test code: 83935

## 2023-05-15 LAB — MAGNESIUM: Magnesium: 2.2 mg/dL (ref 1.7–2.4)

## 2023-05-15 MED ORDER — MECLIZINE HCL 25 MG PO TABS
25.0000 mg | ORAL_TABLET | Freq: Three times a day (TID) | ORAL | 0 refills | Status: DC
  Filled 2023-05-15: qty 30, 10d supply, fill #0

## 2023-05-15 MED ORDER — POTASSIUM CHLORIDE 20 MEQ PO PACK
40.0000 meq | PACK | Freq: Once | ORAL | Status: AC
  Administered 2023-05-15: 40 meq via ORAL
  Filled 2023-05-15: qty 2

## 2023-05-15 MED ORDER — ATORVASTATIN CALCIUM 20 MG PO TABS
20.0000 mg | ORAL_TABLET | Freq: Every day | ORAL | 0 refills | Status: AC
  Filled 2023-05-15: qty 30, 30d supply, fill #0

## 2023-05-15 MED ORDER — CLOPIDOGREL BISULFATE 75 MG PO TABS
75.0000 mg | ORAL_TABLET | Freq: Every day | ORAL | 0 refills | Status: AC
  Filled 2023-05-15: qty 30, 30d supply, fill #0

## 2023-05-15 NOTE — Progress Notes (Signed)
 Physical Therapy Treatment Patient Details Name: Rhonda Palmer MRN: [id number] DOB: 1970/12/31 Today's Date: 05/15/2023   History of Present Illness Pt is a 53 year old female who presents to ED for chief concerns of not verbally responsive. MRI negative. 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    PT Comments  Patient alert, agreeable to PT, seated in recliner. Pt requested to attempt mobility without RW, but reached for and needed minA via handheld assistance throughout transfers and ambulation. Pt/PT discussed RW use, education, and encouragement, pt agreeable to use RW moving forward. Bed mobility modI, minA for L and R [employer] assessment. Pt without nystagmus, but reported increased dizziness/symptoms on R, Epley performed with minimal to no change in symptoms, low suspicion for BPPV. Pt in bed with needs in reach. The patient would benefit from further skilled PT intervention to continue to progress towards goals.    If plan is discharge home, recommend the following: A little help with walking and/or transfers;Assist for transportation;Help with stairs or ramp for entrance   Can travel by private vehicle        Equipment Recommendations  Rolling walker (2 wheels)    Recommendations for Other Services       Precautions / Restrictions Precautions Precautions: Fall Recall of Precautions/Restrictions: Intact Restrictions Weight Bearing Restrictions Per Provider Order: No     Mobility  Bed Mobility Overal bed mobility: Modified Independent                  Transfers Overall transfer level: Needs assistance Equipment used: 1 person hand held assist Transfers: Sit to/from Stand Sit to Stand: Min assist           General transfer comment: handheld assist, pt wanted to attempt without  RW    Ambulation/Gait Ambulation/Gait assistance: Min assist Gait Distance (Feet): 30 Feet Assistive device: 1 person hand held assist         General Gait Details: pt wanted to attempt without RW, but very reliant on handheld assist on L. agreeable to use RW during hospital stay/admission with education and encouragement   Stairs             Wheelchair Mobility     Tilt Bed    Modified Rankin (Stroke Patients Only)       Balance Overall balance assessment: Needs assistance Sitting-balance support: Feet supported Sitting balance-Leahy Scale: Good     Standing balance support: Single extremity supported, During functional activity Standing balance-Leahy Scale: Good                              Communication Communication Communication: No apparent difficulties  Cognition Arousal: Alert Behavior During Therapy: WFL for tasks assessed/performed   PT - Cognitive impairments: No apparent impairments                         Following commands: Intact      Cueing    Exercises Other Exercises Other Exercises: L and R Trenda Craze, R Epley due to reported symptoms    General Comments        Pertinent Vitals/Pain Pain Assessment Pain Assessment: No/denies pain Faces Pain Scale: Hurts a little bit Pain Location: back of head/neck Pain Descriptors / Indicators: Aching, Headache, Sore Pain Intervention(s): Limited activity within patient's tolerance, Monitored during  session, Repositioned    Home Living                          Prior Function            PT Goals (current goals can now be found in the care plan section) Progress towards PT goals: Progressing toward goals    Frequency    Min 2X/week      PT Plan      Co-evaluation              AM-PAC PT 6 Clicks Mobility   Outcome Measure  Help needed turning from your back to your side while in a flat bed without using bedrails?: None Help  needed moving from lying on your back to sitting on the side of a flat bed without using bedrails?: None Help needed moving to and from a bed to a chair (including a wheelchair)?: A Little Help needed standing up from a chair using your arms (e.g., wheelchair or bedside chair)?: A Little Help needed to walk in hospital room?: A Little Help needed climbing 3-5 steps with a railing? : A Little 6 Click Score: 20    End of Session Equipment Utilized During Treatment: Gait belt Activity Tolerance: Patient tolerated treatment well Patient left: in bed;with call bell/phone within reach   PT Visit Diagnosis: Unsteadiness on feet (R26.81);Muscle weakness (generalized) (M62.81);Difficulty in walking, not elsewhere classified (R26.2)     Time: 1014-1030 PT Time Calculation (min) (ACUTE ONLY): 16 min  Charges:    $Canalith Rep Proc: 8-22 mins PT General Charges $$ ACUTE PT VISIT: 1 Visit                     Doyal Shams PT, DPT 11:04 AM,05/15/23

## 2023-05-15 NOTE — TOC Transition Note (Signed)
 Transition of Care [facility]) - Discharge Note   Patient Details  Name: Rhonda Palmer MRN: [id number] Date of Birth: 1970/12/21  Transition of Care [facility]) CM/SW Contact:  Dalia GORMAN Fuse, RN Phone Number: 05/15/2023, 10:54 AM   Clinical Narrative:      Patient is medically clear for dc to home. TOC spoke with Thom Haddock with [employer] and RW will be delivered to the patient's home. Patient has called [employer] and they will provide transportation.   Final next level of care: Home/Self Care Barriers to Discharge: Continued Medical Work up   Patient Goals and CMS Choice            Discharge Placement                       Discharge Plan and Services Additional resources added to the After Visit Summary for                  DME Arranged: Walker rolling DME Agency: [employer] Date DME Agency Contacted: 05/15/23 Time DME Agency Contacted: 1054 Representative spoke with at DME Agency: Thom Haddock            Social Drivers of Health (SDOH) Interventions SDOH Screenings   Food Insecurity: Patient Declined (05/13/2023)  Housing: Patient Declined (05/13/2023)  Transportation Needs: Unmet Transportation Needs (05/13/2023)  Utilities: Not At Risk (05/13/2023)  Alcohol Screen: Low Risk  (11/24/2022)  Depression (PHQ2-9): Low Risk  (11/24/2022)  Financial Resource Strain: Low Risk  (02/26/2023)   Received from [facility]  Social Connections: Socially Isolated (05/13/2023)  Tobacco Use: Low Risk  (05/13/2023)     Readmission Risk Interventions    01/13/2022    9:50 AM 01/07/2022    2:24 PM 01/15/2021    3:47 PM  Readmission Risk Prevention Plan  Transportation Screening Complete  Complete  PCP or Specialist Appt within 3-5 Days   Complete  HRI or Home Care Consult Complete --   Social Work Consult for Recovery Care Planning/Counseling Complete Complete Complete  Palliative Care Screening Not Applicable Not Applicable Not Applicable  Medication Review  Oceanographer) Complete Complete Complete

## 2023-05-15 NOTE — Discharge Summary (Signed)
 Physician Discharge Summary   Patient: Rhonda Palmer MRN: [id number] DOB: November 02, 1970  Admit date:     05/13/2023  Discharge date: 05/15/23  Discharge Physician: Donaciano Frizzle   PCP: Sharyne Degree, FNP   Recommendations at discharge:   Follow-up with PCP in 1 week. Follow-up with neurology in 1 month.  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 *  Hospital Course: Ms. 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 She reported a sudden onset of headache in the lower posterior head, severe vertigo, than started vomiting. In the ambulance, she could not talk.  Symptoms lasted for 2 hours, mostly resolved. But still feels dizzy with vertigo, unsteady gait.  At baseline, she exercises regularly.  MRI brain and CT angio of head and neck are negative.   Echocardiogram showed ejection fraction 60 to 65% with grade 1 diastolic dysfunction. Patient has been seen by neurology, condition consistent with a TIA, patient be treated with Plavix .  Assessment and Plan:   TIA (transient ischemic attack) Posterior  headache. Vertigo. Stroke workup including MRI of the brain, CT angiogram of the head and neck did not show any significant changes. Etiology of symptoms unclear, pending neurology consult. Seen by neurology, condition consistent with TIA.  Recommended Plavix  and a statin.  Follow-up with neurology as outpatient. Continue meclizine , home PT/OT.  Hypertension Resume home treatment.   Hypothyroidism Home levothyroxine  250 mcg daily resumed   Diabetes mellitus type 2, uncomplicated (HCC) A1c 5.4, well-controlled.   Morbid obesity (HCC) Class II obesity with comorbidities.  BMI 35.13. This complicates overall care and prognosis.    Hypophosphatemia Hypokalemia. Improved, potassium 3.5, will give additional 40 mEq KCl orally.   Bipolar 1 disorder (HCC) Resume home treatment.   Chronic pain syndrome History of opioid abuse (HCC) Resume home treatment              Consultants: Neurology Procedures performed: None  Disposition: Home health Diet recommendation:  Discharge Diet Orders (From admission, onward)     Start     Ordered   05/15/23 0000  Diet - low sodium heart healthy        05/15/23 1041           Cardiac diet DISCHARGE MEDICATION: Allergies as of 05/15/2023       Reactions   Codeine Hives, Itching, Swelling, Rash   Takes oxycodone , morphine     Sulfa Antibiotics Anaphylaxis   Penicillins Itching   TOLERATED CEFAZOLIN    Aspirin  Rash        Medication List     TAKE these medications    Aristada  1064 MG/3.9ML prefilled syringe Generic drug: ARIPiprazole  Lauroxil ER Inject 3.9 mLs into the muscle as directed. Every  2 months   atorvastatin  20 MG tablet Commonly known as: LIPITOR  Take 1 tablet (20 mg total) by mouth daily. Start taking on: May 16, 2023   clonazePAM  0.5 MG tablet Commonly known as: KLONOPIN  Take 0.5 mg by mouth daily.   clopidogrel  75 MG tablet Commonly known as: PLAVIX  Take 1 tablet (75 mg total) by mouth  daily. Start taking on: May 16, 2023   gabapentin  400 MG capsule Commonly known as: NEURONTIN  Take 400 mg by mouth 3 (three) times daily.   lamoTRIgine  200 MG tablet Commonly known as: LAMICTAL  Take 200 mg by mouth daily.   levothyroxine  125 MCG tablet Commonly known as: SYNTHROID  Take 2 tablets (250 mcg total) by mouth daily at 6 (six) AM. What changed: how much to take   lisinopril  20 MG tablet Commonly known as: ZESTRIL  Take 1 tablet (20 mg total) by mouth daily.   meclizine  25 MG tablet Commonly known as: ANTIVERT  Take 1 tablet (25 mg total) by mouth 3 (three) times daily.   ondansetron  4 MG tablet Commonly known as: ZOFRAN  Take 4 mg by mouth every 6 (six) hours as needed.   Ozempic (1 MG/DOSE) 4 MG/3ML Sopn Generic drug: Semaglutide (1 MG/DOSE) Inject 1 mg into the skin once a week.   propranolol  40 MG tablet Commonly known as: INDERAL  Take 40 mg by mouth 2 (two) times daily.   QUEtiapine  300 MG tablet Commonly known as: SEROQUEL  Take 300 mg by mouth at bedtime.   torsemide  20 MG tablet Commonly known as: DEMADEX  Take 1 tablet (20 mg total) by mouth daily.   traMADol  50 MG tablet Commonly known as: ULTRAM  Take 1 tablet by mouth every 6 (six) hours as needed.        Follow-up Information     Sharyne Degree, FNP Follow up in 1 week(s).   Specialty: Family Medicine Why: Hospital follow up Contact information: [address] [place] [address] [phone]         [facility] Follow up in 1 month(s).   Contact information: [address] [place]  [address] [phone]               Discharge Exam: Cleavon Curls Weights   05/13/23 1159  Weight: 81.6 kg   General exam: Appears calm and comfortable  Respiratory system: Clear to auscultation. Respiratory effort normal. Cardiovascular system: S1 & S2 heard, RRR. No JVD, murmurs, rubs, gallops or clicks. No pedal  edema. Gastrointestinal system: Abdomen is nondistended, soft and nontender. No organomegaly or masses felt. Normal bowel sounds heard. Central nervous system: Alert and oriented. No focal neurological deficits. Extremities: Symmetric 5 x 5 power. Skin: No rashes, lesions or ulcers Psychiatry: Judgement and insight appear normal. Mood & affect appropriate.    Condition at discharge: good  The results of significant diagnostics from this hospitalization (including imaging, microbiology, ancillary and laboratory) are listed below for reference.   Imaging Studies: ECHOCARDIOGRAM COMPLETE Result Date: 05/15/2023    ECHOCARDIOGRAM REPORT   Patient Name:   KAROLYN MESSING Date of Exam: 05/15/2023 Medical Rec #:  [id number]        Height:       60.0 in Accession #:    [id number]       Weight:       179.9 lb Date of Birth:  [birth year]        BSA:          1.784 m Patient Age:    54  years         BP:           108/79 mmHg Patient Gender: F                HR:           72 bpm. Exam Location:  [facility] Procedure: 2D Echo, Cardiac Doppler, Color Doppler and Saline Contrast Bubble            Study (Both Spectral and Color Flow Doppler were utilized during            procedure). Indications:     TIA G45.9  History:         Patient has prior history of Echocardiogram examinations, most                  recent 03/12/2020.  Sonographer:     Broadus Canes Referring Phys:  [id number] Donaciano Frizzle Diagnosing Phys: Constancia Delton MD IMPRESSIONS  1. Left ventricular ejection fraction, by estimation, is 60 to 65%. The left ventricle has normal function. The left ventricle has no regional wall motion abnormalities. Left ventricular diastolic parameters are consistent with Grade I diastolic dysfunction (impaired relaxation).  2. Right ventricular systolic function is normal. The right ventricular size is normal.  3. The mitral valve is normal in structure. No evidence of mitral valve regurgitation.  4. The aortic valve is normal in  structure. Aortic valve regurgitation is not visualized.  5. Agitated saline contrast bubble study was negative, with no evidence of any interatrial shunt. FINDINGS  Left Ventricle: Left ventricular ejection fraction, by estimation, is 60 to 65%. The left ventricle has normal function. The left ventricle has no regional wall motion abnormalities. The left ventricular internal cavity size was normal in size. There is  no left ventricular hypertrophy. Left ventricular diastolic parameters are consistent with Grade I diastolic dysfunction (impaired relaxation). Right Ventricle: The right ventricular size is normal. No increase in right ventricular wall thickness. Right ventricular systolic function is normal. Left Atrium: Left atrial size was normal in size. Right Atrium: Right atrial size was normal in size. Pericardium: There is no evidence of pericardial effusion. Mitral Valve: The mitral valve is normal in structure. No evidence of mitral valve regurgitation. MV peak gradient, 3.2 mmHg. The mean mitral valve gradient is 1.0 mmHg. Tricuspid Valve: The tricuspid valve is normal in structure. Tricuspid valve regurgitation is not demonstrated. Aortic Valve: The aortic valve is normal in structure. Aortic valve regurgitation is not visualized. Aortic valve mean gradient measures 3.0 mmHg. Aortic valve peak gradient measures 5.2 mmHg. Aortic valve area, by VTI measures 4.60 cm. Pulmonic Valve: The pulmonic valve was normal in structure. Pulmonic valve regurgitation is not visualized. Aorta: The aortic root is normal in size and structure. IAS/Shunts: No atrial level shunt detected by color flow Doppler. Agitated saline contrast was given intravenously to evaluate for intracardiac shunting. Agitated saline contrast bubble study was negative, with no evidence of any interatrial shunt.  LEFT VENTRICLE PLAX 2D LVIDd:         3.60 cm   Diastology LVIDs:         2.60 cm   LV e' medial:    6.31 cm/s LV PW:         1.10 cm   LV  E/e' medial:  11.4 LV IVS:        1.30 cm   LV e' lateral:   8.49 cm/s LVOT diam:     2.20 cm  LV E/e' lateral: 8.5 LV SV:         88 LV SV Index:   49 LVOT Area:     3.80 cm  RIGHT VENTRICLE RV Basal diam:  2.70 cm RV Mid diam:    2.40 cm RV S prime:     13.30 cm/s LEFT ATRIUM           Index        RIGHT ATRIUM          Index LA diam:      3.50 cm 1.96 cm/m   RA Area:     7.57 cm LA Vol (A2C): 55.3 ml 30.99 ml/m  RA Volume:   13.30 ml 7.45 ml/m LA Vol (A4C): 13.3 ml 7.45 ml/m  AORTIC VALVE AV Area (Vmax):    3.70 cm AV Area (Vmean):   3.94 cm AV Area (VTI):     4.60 cm AV Vmax:           114.00 cm/s AV Vmean:          86.100 cm/s AV VTI:            0.191 m AV Peak Grad:      5.2 mmHg AV Mean Grad:      3.0 mmHg LVOT Vmax:         111.00 cm/s LVOT Vmean:        89.300 cm/s LVOT VTI:          0.231 m LVOT/AV VTI ratio: 1.21  AORTA Ao Root diam: 3.20 cm MITRAL VALVE               TRICUSPID VALVE MV Area (PHT): 3.02 cm    TR Peak grad:   19.0 mmHg MV Area VTI:   3.95 cm    TR Vmax:        218.00 cm/s MV Peak grad:  3.2 mmHg MV Mean grad:  1.0 mmHg    SHUNTS MV Vmax:       0.90 m/s    Systemic VTI:  0.23 m MV Vmean:      54.7 cm/s   Systemic Diam: 2.20 cm MV Decel Time: 251 msec MV E velocity: 71.80 cm/s MV A velocity: 87.00 cm/s MV E/A ratio:  0.83 Constancia Delton MD Electronically signed by Constancia Delton MD Signature Date/Time: 05/15/2023/10:18:42 AM    Final    MR BRAIN WO CONTRAST Result Date: 05/13/2023 CLINICAL DATA:  Syncope/presyncope, cerebrovascular cause suspected EXAM: MRI HEAD WITHOUT CONTRAST TECHNIQUE: Multiplanar, multiecho pulse sequences of the brain and surrounding structures were obtained without intravenous contrast. COMPARISON:  Same day CT head. FINDINGS: Brain: No acute infarction, hemorrhage, hydrocephalus, extra-axial collection or mass lesion. Vascular: Normal flow voids. Skull and upper cervical spine: Normal marrow signal. Sinuses/Orbits: Negative. IMPRESSION: Normal  brain MRI. No acute abnormality. Electronically Signed   By: Stevenson Elbe M.D.   On: 05/13/2023 21:05   CT ANGIO HEAD NECK W WO CM Result Date: 05/13/2023 CLINICAL DATA:  Vertigo, central. Schizoaffective disorder. Altered mental status. EXAM: CT ANGIOGRAPHY HEAD AND NECK WITH AND WITHOUT CONTRAST TECHNIQUE: Multidetector CT imaging of the head and neck was performed using the standard protocol during bolus administration of intravenous contrast. Multiplanar CT image reconstructions and MIPs were obtained to evaluate the vascular anatomy. Carotid stenosis measurements (when applicable) are obtained utilizing NASCET criteria, using the distal internal carotid diameter as the denominator. RADIATION DOSE REDUCTION: This exam was performed according to the departmental dose-optimization program which includes automated  exposure control, adjustment of the mA and/or kV according to patient size and/or use of iterative reconstruction technique. CONTRAST:  75mL OMNIPAQUE  IOHEXOL  350 MG/ML SOLN COMPARISON:  Head CT earlier same day FINDINGS: CTA NECK FINDINGS Aortic arch: Normal Right carotid system: Common carotid artery widely patent to the bifurcation. Normal carotid bifurcation. Normal cervical ICA. Left carotid system: Common carotid artery widely patent to the bifurcation. Normal bifurcation. Normal cervical ICA. Vertebral arteries: No proximal subclavian stenosis. Left vertebral artery arises from the arch. Both vertebral arteries are widely patent through the cervical region to the foramen magnum. Skeleton: No significant degenerative change. No acute cervical spine finding. There is either an old nonunited fracture of the spinous process of C7 or this is a developmental appearance. Not significant. Other neck: No mass or lymphadenopathy. Upper chest: Mild chronic scarring in the right upper lobe. Review of the MIP images confirms the above findings CTA HEAD FINDINGS Anterior circulation: Both internal  carotid arteries are patent through the skull base and siphon regions. No siphon stenosis. The anterior and middle cerebral vessels patent. No occlusion or stenosis. No aneurysm or vascular malformation. Fetal origin left PCA. Posterior circulation: Both vertebral arteries widely patent to the basilar artery. No basilar stenosis. Posterior circulation branch vessels are normal. Venous sinuses: Patent and normal. Anatomic variants: None significant. Review of the MIP images confirms the above findings IMPRESSION: 1. Normal CTA of the head and neck. No evidence of carotid or vertebral artery stenosis. No intracranial large or medium vessel occlusion or correctable stenosis. 2. Mild chronic scarring in the right upper lobe. Electronically Signed   By: Bettylou Brunner M.D.   On: 05/13/2023 16:11   CT Head Wo Contrast Result Date: 05/13/2023 CLINICAL DATA:  Mental status change of unknown cause. Verbally unresponsive. EXAM: CT HEAD WITHOUT CONTRAST TECHNIQUE: Contiguous axial images were obtained from the base of the skull through the vertex without intravenous contrast. RADIATION DOSE REDUCTION: This exam was performed according to the departmental dose-optimization program which includes automated exposure control, adjustment of the mA and/or kV according to patient size and/or use of iterative reconstruction technique. COMPARISON:  01/11/2021 FINDINGS: Brain: The brain shows a normal appearance without evidence of malformation, atrophy, old or acute small or large vessel infarction, mass lesion, hemorrhage, hydrocephalus or extra-axial collection. Vascular: No hyperdense vessel. No evidence of atherosclerotic calcification. Skull: Normal.  No traumatic finding.  No focal bone lesion. Sinuses/Orbits: Sinuses are clear. Orbits appear normal. Mastoids are clear. Other: None significant IMPRESSION: Normal head CT. Electronically Signed   By: Bettylou Brunner M.D.   On: 05/13/2023 12:10    Microbiology: Results for orders  placed or performed during the hospital encounter of 05/13/23  Urine Culture     Status: Abnormal   Collection Time: 05/13/23 11:47 AM   Specimen: Urine, Clean Catch  Result Value Ref Range Status   Specimen Description   Final    URINE, CLEAN CATCH Performed at [facility], [address], [place], [place] [address]    Special Requests   Final    NONE Performed at [facility], [address] [place], [place] [address]    Culture (A)  Final    20,000 COLONIES/mL LACTOBACILLUS SPECIES Standardized susceptibility testing for this organism is not available. Performed at [facility], [address] [place], [place] [address]    Report Status 05/14/2023 FINAL  Final    Labs: CBC: Recent Labs  Lab 05/13/23 1148  WBC 9.9  NEUTROABS  6.6  HGB 14.4  HCT 44.9  MCV 87.2  PLT 211   Basic Metabolic Panel: Recent Labs  Lab 05/13/23 1148 05/14/23 0431 05/15/23 0335  NA 136 139 140  K 3.4* 3.1* 3.5  CL 106 108 109  CO2 23 24 24   GLUCOSE 242* 88 87  BUN 18 13 15   CREATININE 0.72 0.58 0.55  CALCIUM  9.0 8.8* 8.9  MG 2.0  --  2.2  PHOS 2.3* 3.1  --    Liver Function Tests: Recent Labs  Lab 05/13/23 1148  AST 19  ALT 21  ALKPHOS 126  BILITOT 1.2  PROT 7.4  ALBUMIN  4.0   CBG: Recent Labs  Lab 05/14/23 0734 05/14/23 1136 05/14/23 1615 05/14/23 2016 05/15/23 0729  GLUCAP 86 95 134* 83 94    Discharge time spent: greater than 30 minutes.  Signed: Donaciano Frizzle, MD [facility] 05/15/2023

## 2023-05-15 NOTE — Progress Notes (Signed)
*  PRELIMINARY RESULTS* Echocardiogram 2D Echocardiogram has been performed.  Rhonda Palmer 05/15/2023, 9:02 AM

## 2023-05-15 NOTE — Progress Notes (Signed)
 Occupational Therapy Treatment Patient Details Name: Rhonda Palmer MRN: [id number] DOB: 11/13/1970 Today's Date: 05/15/2023   History of present illness Pt is a 53 year old female who presents to ED for chief concerns of not verbally responsive. MRI negative. 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   OT comments  Pt is supine in bed on arrival. Pleasant and agreeable to OT session. She continues to report intermittent headaches on back of her head/sides and intermittent dizziness more noticeable with L head turns. Pt performed bed mobility MOD I, STS with SUP to RW and mobility in room using RW with SUP and no LOB. Able to sit/stand at sink in bathroom with SUP for all UB/LB ADLs with occasional unilateral support on sink. Pt left seated in recliner with all needs in place and will cont to require skilled acute OT services to maximize her safety and IND to return to PLOF.       If plan is discharge home, recommend the following:  A little help with walking and/or transfers;A little help with bathing/dressing/bathroom;Help with stairs or ramp for entrance;Assistance with cooking/housework   Equipment Recommendations  Other (comment) (RW)    Recommendations for Other Services      Precautions / Restrictions Precautions Precautions: Fall Recall of Precautions/Restrictions: Intact Restrictions Weight Bearing Restrictions Per Provider Order: No       Mobility Bed Mobility Overal bed mobility: Modified Independent                  Transfers Overall transfer level: Needs assistance Equipment used: Rolling walker (2 wheels) Transfers: Sit to/from Stand Sit to Stand: Supervision           General transfer comment: RW, SUP for STS and mobility to the bathroom and back and throughout dynamic  standing for ADL session; pt reports continued dizziness with L head turns     Balance Overall balance assessment: Needs assistance Sitting-balance support: Feet supported Sitting balance-Leahy Scale: Good     Standing balance support: Single extremity supported, During functional activity Standing balance-Leahy Scale: Good Standing balance comment: no LOB with SUP standing at sink with occasional unilateral support on sink                           ADL either performed or assessed with clinical judgement   ADL Overall ADL's : Needs assistance/impaired     Grooming: Oral care;Standing;Supervision/safety   Upper Body Bathing: Supervision/ safety;Standing   Lower Body Bathing: Supervison/ safety;Sitting/lateral leans;Modified independent   Upper Body Dressing : Minimal assistance Upper Body Dressing Details (indicate cue type and reason): to don/doff gown around tele Lower Body Dressing: Supervision/safety;Sitting/lateral leans;Sit to/from stand Lower Body Dressing Details (indicate cue type and reason): to don underwear and pants Toilet Transfer: Supervision/safety;Modified Independent Toilet Transfer Details (indicate cue type and reason): to chair with arms in bathroom at sink         Functional mobility during ADLs: Contact guard assist;Rolling walker (2 wheels);Supervision/safety      Extremity/Trunk Assessment              Occupational psychologist Communication: No apparent difficulties   Cognition Arousal: Alert Behavior During Therapy: WFL for tasks assessed/performed  Following commands: Intact        Cueing   Cueing Techniques: Verbal cues  Exercises      Shoulder Instructions       General Comments      Pertinent Vitals/ Pain       Pain Assessment Pain Assessment: Faces Faces Pain Scale: Hurts a little bit Pain Location: back of her  head Pain Descriptors / Indicators: Aching, Headache Pain Intervention(s): Monitored during session, Limited activity within patient's tolerance, Repositioned  Home Living                                          Prior Functioning/Environment              Frequency  Min 2X/week        Progress Toward Goals  OT Goals(current goals can now be found in the care plan section)  Progress towards OT goals: Progressing toward goals  Acute Rehab OT Goals Patient Stated Goal: return home OT Goal Formulation: With patient Time For Goal Achievement: 05/28/23 Potential to Achieve Goals: Fair  Plan      Co-evaluation                 AM-PAC OT "6 Clicks" Daily Activity     Outcome Measure   Help from another person eating meals?: None Help from another person taking care of personal grooming?: None Help from another person toileting, which includes using toliet, bedpan, or urinal?: A Little Help from another person bathing (including washing, rinsing, drying)?: A Little Help from another person to put on and taking off regular upper body clothing?: None Help from another person to put on and taking off regular lower body clothing?: A Little 6 Click Score: 21    End of Session Equipment Utilized During Treatment: Rolling walker (2 wheels)  OT Visit Diagnosis: Other abnormalities of gait and mobility (R26.89);Unsteadiness on feet (R26.81)   Activity Tolerance Patient tolerated treatment well   Patient Left in chair;with call bell/phone within reach;with chair alarm set   Nurse Communication Mobility status        Time: 1610-9604 OT Time Calculation (min): 28 min  Charges: OT General Charges $OT Visit: 1 Visit OT Treatments $Self Care/Home Management : 23-37 mins  Kitty Cadavid, OTR/L  05/15/23, 9:53 AM   Bradey Luzier E Shon Indelicato 05/15/2023, 9:51 AM

## 2023-07-29 ENCOUNTER — Other Ambulatory Visit: Payer: Self-pay | Admitting: Family Medicine

## 2023-07-29 DIAGNOSIS — Z1231 Encounter for screening mammogram for malignant neoplasm of breast: Secondary | ICD-10-CM

## 2023-08-19 ENCOUNTER — Encounter: Payer: Self-pay | Admitting: Internal Medicine

## 2023-08-19 ENCOUNTER — Other Ambulatory Visit: Payer: Self-pay

## 2023-08-19 ENCOUNTER — Emergency Department: Payer: MEDICAID

## 2023-08-19 ENCOUNTER — Inpatient Hospital Stay
Admission: EM | Admit: 2023-08-19 | Discharge: 2023-08-27 | DRG: 394 | Disposition: A | Discharge status: Home / Self Care | Payer: MEDICAID | Attending: Internal Medicine | Admitting: Internal Medicine

## 2023-08-19 DIAGNOSIS — M5431 Sciatica, right side: Secondary | ICD-10-CM | POA: Diagnosis present

## 2023-08-19 DIAGNOSIS — G894 Chronic pain syndrome: Secondary | ICD-10-CM | POA: Diagnosis present

## 2023-08-19 DIAGNOSIS — F419 Anxiety disorder, unspecified: Secondary | ICD-10-CM | POA: Diagnosis present

## 2023-08-19 DIAGNOSIS — K922 Gastrointestinal hemorrhage, unspecified: Secondary | ICD-10-CM | POA: Diagnosis present

## 2023-08-19 DIAGNOSIS — Z7902 Long term (current) use of antithrombotics/antiplatelets: Secondary | ICD-10-CM | POA: Diagnosis not present

## 2023-08-19 DIAGNOSIS — E039 Hypothyroidism, unspecified: Secondary | ICD-10-CM | POA: Diagnosis present

## 2023-08-19 DIAGNOSIS — Z79899 Other long term (current) drug therapy: Secondary | ICD-10-CM

## 2023-08-19 DIAGNOSIS — M797 Fibromyalgia: Secondary | ICD-10-CM | POA: Diagnosis present

## 2023-08-19 DIAGNOSIS — Z888 Allergy status to other drugs, medicaments and biological substances status: Secondary | ICD-10-CM

## 2023-08-19 DIAGNOSIS — Z880 Allergy status to penicillin: Secondary | ICD-10-CM | POA: Diagnosis not present

## 2023-08-19 DIAGNOSIS — Z79890 Hormone replacement therapy (postmenopausal): Secondary | ICD-10-CM | POA: Diagnosis not present

## 2023-08-19 DIAGNOSIS — K5909 Other constipation: Secondary | ICD-10-CM | POA: Diagnosis present

## 2023-08-19 DIAGNOSIS — Z7985 Long-term (current) use of injectable non-insulin antidiabetic drugs: Secondary | ICD-10-CM

## 2023-08-19 DIAGNOSIS — R1031 Right lower quadrant pain: Secondary | ICD-10-CM | POA: Diagnosis not present

## 2023-08-19 DIAGNOSIS — K625 Hemorrhage of anus and rectum: Secondary | ICD-10-CM | POA: Diagnosis not present

## 2023-08-19 DIAGNOSIS — R5383 Other fatigue: Secondary | ICD-10-CM | POA: Diagnosis present

## 2023-08-19 DIAGNOSIS — K641 Second degree hemorrhoids: Secondary | ICD-10-CM | POA: Diagnosis present

## 2023-08-19 DIAGNOSIS — Z8673 Personal history of transient ischemic attack (TIA), and cerebral infarction without residual deficits: Secondary | ICD-10-CM

## 2023-08-19 DIAGNOSIS — K838 Other specified diseases of biliary tract: Secondary | ICD-10-CM | POA: Diagnosis present

## 2023-08-19 DIAGNOSIS — R1084 Generalized abdominal pain: Secondary | ICD-10-CM | POA: Diagnosis not present

## 2023-08-19 DIAGNOSIS — I110 Hypertensive heart disease with heart failure: Secondary | ICD-10-CM | POA: Diagnosis present

## 2023-08-19 DIAGNOSIS — I5032 Chronic diastolic (congestive) heart failure: Secondary | ICD-10-CM | POA: Diagnosis present

## 2023-08-19 DIAGNOSIS — D62 Acute posthemorrhagic anemia: Secondary | ICD-10-CM | POA: Diagnosis not present

## 2023-08-19 DIAGNOSIS — Z886 Allergy status to analgesic agent status: Secondary | ICD-10-CM | POA: Diagnosis not present

## 2023-08-19 DIAGNOSIS — Z90710 Acquired absence of both cervix and uterus: Secondary | ICD-10-CM

## 2023-08-19 DIAGNOSIS — E119 Type 2 diabetes mellitus without complications: Secondary | ICD-10-CM | POA: Diagnosis present

## 2023-08-19 DIAGNOSIS — R109 Unspecified abdominal pain: Secondary | ICD-10-CM | POA: Diagnosis present

## 2023-08-19 DIAGNOSIS — Z885 Allergy status to narcotic agent status: Secondary | ICD-10-CM | POA: Diagnosis not present

## 2023-08-19 DIAGNOSIS — K567 Ileus, unspecified: Principal | ICD-10-CM

## 2023-08-19 DIAGNOSIS — G8929 Other chronic pain: Secondary | ICD-10-CM | POA: Diagnosis present

## 2023-08-19 DIAGNOSIS — Z8249 Family history of ischemic heart disease and other diseases of the circulatory system: Secondary | ICD-10-CM

## 2023-08-19 DIAGNOSIS — Z882 Allergy status to sulfonamides status: Secondary | ICD-10-CM

## 2023-08-19 LAB — HEMOGLOBIN AND HEMATOCRIT, BLOOD
HCT: 35.3 % — ABNORMAL LOW (ref 36.0–46.0)
HCT: 35.7 % — ABNORMAL LOW (ref 36.0–46.0)
HCT: 33.5 % — ABNORMAL LOW (ref 36.0–46.0)
Hemoglobin: 11 g/dL — ABNORMAL LOW (ref 12.0–15.0)
Hemoglobin: 11 g/dL — ABNORMAL LOW (ref 12.0–15.0)
Hemoglobin: 10.7 g/dL — ABNORMAL LOW (ref 12.0–15.0)

## 2023-08-19 LAB — COMPREHENSIVE METABOLIC PANEL WITH GFR
ALT: 18 U/L (ref 0–44)
AST: 22 U/L (ref 15–41)
Albumin: 3.4 g/dL — ABNORMAL LOW (ref 3.5–5.0)
Alkaline Phosphatase: 104 U/L (ref 38–126)
Anion gap: 5 (ref 5–15)
BUN: 15 mg/dL (ref 6–20)
CO2: 27 mmol/L (ref 22–32)
Calcium: 9.2 mg/dL (ref 8.9–10.3)
Chloride: 106 mmol/L (ref 98–111)
Creatinine, Ser: 0.54 mg/dL (ref 0.44–1.00)
GFR, Estimated: >60 mL/min (ref >60)
Glucose, Bld: 93 mg/dL (ref 70–99)
Potassium: 3.8 mmol/L (ref 3.5–5.1)
Sodium: 138 mmol/L (ref 135–145)
Total Bilirubin: 0.6 mg/dL (ref 0.0–1.2)
Total Protein: 6.7 g/dL (ref 6.5–8.1)

## 2023-08-19 LAB — URINALYSIS, W/ REFLEX TO CULTURE (INFECTION SUSPECTED)
Bilirubin Urine: NEGATIVE
Glucose, UA: NEGATIVE mg/dL
Ketones, ur: NEGATIVE mg/dL
Leukocytes,Ua: NEGATIVE
Nitrite: NEGATIVE
Protein, ur: NEGATIVE mg/dL
Specific Gravity, Urine: >1.046 — ABNORMAL HIGH (ref 1.005–1.030)
pH: 5 (ref 5.0–8.0)

## 2023-08-19 LAB — CBC WITH DIFFERENTIAL/PLATELET
Abs Immature Granulocytes: 0.02 K/uL (ref 0.00–0.07)
Basophils Absolute: 0.1 K/uL (ref 0.0–0.1)
Basophils Relative: 1 %
Eosinophils Absolute: 0.3 K/uL (ref 0.0–0.5)
Eosinophils Relative: 5 %
HCT: 40 % (ref 36.0–46.0)
Hemoglobin: 12.4 g/dL (ref 12.0–15.0)
Immature Granulocytes: 0 %
Lymphocytes Relative: 34 %
Lymphs Abs: 1.8 K/uL (ref 0.7–4.0)
MCH: 28.6 pg (ref 26.0–34.0)
MCHC: 31 g/dL (ref 30.0–36.0)
MCV: 92.4 fL (ref 80.0–100.0)
Monocytes Absolute: 0.4 K/uL (ref 0.1–1.0)
Monocytes Relative: 8 %
Neutro Abs: 2.8 K/uL (ref 1.7–7.7)
Neutrophils Relative %: 52 %
Platelets: 216 K/uL (ref 150–400)
RBC: 4.33 MIL/uL (ref 3.87–5.11)
RDW: 13.6 % (ref 11.5–15.5)
WBC: 5.4 K/uL (ref 4.0–10.5)
nRBC: 0 % (ref 0.0–0.2)

## 2023-08-19 LAB — LIPASE, BLOOD: Lipase: 22 U/L (ref 11–51)

## 2023-08-19 LAB — TSH: TSH: 2.123 u[IU]/mL (ref 0.350–4.500)

## 2023-08-19 MED ORDER — MORPHINE SULFATE (PF) 4 MG/ML IV SOLN
4.0000 mg | Freq: Once | INTRAVENOUS | Status: AC
  Administered 2023-08-19: 4 mg via INTRAVENOUS
  Filled 2023-08-19: qty 1

## 2023-08-19 MED ORDER — QUETIAPINE FUMARATE 300 MG PO TABS
600.0000 mg | ORAL_TABLET | Freq: Every day | ORAL | Status: DC
  Administered 2023-08-19 – 2023-08-26 (×8): 600 mg via ORAL
  Filled 2023-08-19 (×8): qty 2

## 2023-08-19 MED ORDER — PEG 3350-KCL-NA BICARB-NACL 420 G PO SOLR
4000.0000 mL | Freq: Once | ORAL | Status: AC
  Administered 2023-08-19: 4000 mL via ORAL
  Filled 2023-08-19: qty 4000

## 2023-08-19 MED ORDER — ONDANSETRON HCL 4 MG/2ML IJ SOLN
4.0000 mg | Freq: Four times a day (QID) | INTRAMUSCULAR | Status: DC | PRN
  Administered 2023-08-19 – 2023-08-27 (×14): 4 mg via INTRAVENOUS
  Filled 2023-08-19 (×14): qty 2

## 2023-08-19 MED ORDER — HYDROMORPHONE HCL 1 MG/ML IJ SOLN
0.5000 mg | INTRAMUSCULAR | Status: DC | PRN
  Administered 2023-08-19 – 2023-08-22 (×15): 0.5 mg via INTRAVENOUS
  Filled 2023-08-19 (×15): qty 0.5

## 2023-08-19 MED ORDER — ONDANSETRON HCL 4 MG/2ML IJ SOLN
4.0000 mg | Freq: Once | INTRAMUSCULAR | Status: AC
  Administered 2023-08-19: 4 mg via INTRAVENOUS
  Filled 2023-08-19: qty 2

## 2023-08-19 MED ORDER — SODIUM CHLORIDE 0.9 % IV SOLN: INTRAVENOUS | Status: DC

## 2023-08-19 MED ORDER — CLONAZEPAM 0.5 MG PO TABS
0.5000 mg | ORAL_TABLET | Freq: Every day | ORAL | Status: DC
  Administered 2023-08-19 – 2023-08-22 (×5): 0.5 mg via ORAL
  Filled 2023-08-19 (×5): qty 1

## 2023-08-19 MED ORDER — LAMOTRIGINE 100 MG PO TABS
200.0000 mg | ORAL_TABLET | Freq: Every day | ORAL | Status: DC
  Administered 2023-08-19 – 2023-08-27 (×9): 200 mg via ORAL
  Filled 2023-08-19 (×9): qty 2

## 2023-08-19 MED ORDER — BISACODYL 10 MG RE SUPP
10.0000 mg | Freq: Once | RECTAL | Status: AC
  Administered 2023-08-19: 10 mg via RECTAL
  Filled 2023-08-19: qty 1

## 2023-08-19 MED ORDER — HYDROMORPHONE HCL 1 MG/ML IJ SOLN
1.0000 mg | Freq: Once | INTRAMUSCULAR | Status: AC
  Administered 2023-08-19: 1 mg via INTRAVENOUS
  Filled 2023-08-19 (×2): qty 1

## 2023-08-19 MED ORDER — IOHEXOL 300 MG/ML  SOLN
100.0000 mL | Freq: Once | INTRAMUSCULAR | Status: AC | PRN
  Administered 2023-08-19: 100 mL via INTRAVENOUS

## 2023-08-19 MED ORDER — LEVOTHYROXINE SODIUM 100 MCG PO TABS
200.0000 ug | ORAL_TABLET | Freq: Every day | ORAL | Status: DC
  Administered 2023-08-20 – 2023-08-27 (×8): 200 ug via ORAL
  Filled 2023-08-19 (×4): qty 2
  Filled 2023-08-19: qty 4
  Filled 2023-08-19 (×3): qty 2

## 2023-08-19 MED ORDER — OXYCODONE HCL 5 MG PO TABS
5.0000 mg | ORAL_TABLET | Freq: Four times a day (QID) | ORAL | Status: DC | PRN
  Administered 2023-08-19 – 2023-08-22 (×9): 5 mg via ORAL
  Filled 2023-08-19 (×9): qty 1

## 2023-08-19 NOTE — ED Triage Notes (Signed)
 Pt BIB [facility] d/t RLQ abdominal pain that began around 0300. Sharp. + Nausea. Swelling in legs, more than usual.   EMS VS:  117.87 97% HR 72  Past Medical History:  Diagnosis Date   (HFpEF) heart failure with preserved ejection fraction (HCC)    a. 02/2020 Echo: EF 55-60%, no rwma. Nl RV fxn.   Anxiety    Asthma    Bipolar 1 disorder (HCC)    Diabetes mellitus without complication (HCC)    Edema leg for twenty years per patient   taking lasix  for edema   Fibromyalgia    History of cardiac catheterization    a. 02/2020 Cath: Nl cors. LVEDP -> torsemide  increased to 40mg  daily.   Hypertension    Hypothyroidism    Morbid obesity (HCC)    Ovarian cyst    Palpitations    a. 03/2020 Zio: RSR, avg HR 84 (55-203), occas SVT runs. No significant arrhythmias.  Most triggered events associate with sinus rhythm.   Schizo affective schizophrenia (HCC)

## 2023-08-19 NOTE — ED Notes (Signed)
 Pt assisted to bathroom by NT. Pt reported to this RN she had a moderately sized BM with bright red blood but less blood than last occurrence

## 2023-08-19 NOTE — ED Notes (Signed)
 Pt back from CT

## 2023-08-19 NOTE — ED Provider Notes (Addendum)
 [facility] Provider Note    Event Date/Time   First MD Initiated Contact with Patient 08/19/23 [phone]     (approximate)   History   Abdominal Pain   HPI  Rhonda Palmer is a 53 y.o. female with history of chronic back pain on tramadol , diastolic heart failure, hypertension, diabetes, schizophrenia, hypothyroidism who presents to the emergency department with complaints of right lower abdominal pain that woke her from sleep at 3:30 AM.  She has had nausea but no vomiting or diarrhea.  No fever.  No dysuria, hematuria, vaginal bleeding or discharge.  It does appear she has had previous ovarian cyst but she denies that she has ever had pain like this before.  She has had previous hysterectomy.  Still has her appendix.   Also has chronic lower extremity swelling that she feels like is slightly worse than normal.  She is on diuretics for this.  No chest pain or shortness of breath.   History provided by patient, EMS.    Past Medical History:  Diagnosis Date   (HFpEF) heart failure with preserved ejection fraction (HCC)    a. 02/2020 Echo: EF 55-60%, no rwma. Nl RV fxn.   Anxiety    Asthma    Bipolar 1 disorder (HCC)    Diabetes mellitus without complication (HCC)    Edema leg for twenty years per patient   taking lasix  for edema   Fibromyalgia    History of cardiac catheterization    a. 02/2020 Cath: Nl cors. LVEDP -> torsemide  increased to 40mg  daily.   Hypertension    Hypothyroidism    Morbid obesity (HCC)    Ovarian cyst    Palpitations    a. 03/2020 Zio: RSR, avg HR 84 (55-203), occas SVT runs. No significant arrhythmias.  Most triggered events associate with sinus rhythm.   Schizo affective schizophrenia [facility])     Past Surgical History:  Procedure Laterality Date   ABDOMINAL HYSTERECTOMY     ANKLE SURGERY     pt. states long time ago   LAPAROTOMY N/A 01/05/2022   Procedure: EXPLORATORY LAPAROTOMY WITH REMOVALOF ABDOMINAL ABSCESS;   Surgeon: Tye Millet, DO;  Location: [facility] ORS;  Service: General;  Laterality: N/A;   LEFT HEART CATH AND CORONARY ANGIOGRAPHY N/A 03/13/2020   Procedure: LEFT HEART CATH AND CORONARY ANGIOGRAPHY;  Surgeon: Mady Bruckner, MD;  Location: [facility] INVASIVE CV LAB;  Service: Cardiovascular;  Laterality: N/A;    MEDICATIONS:  Prior to Admission medications   Medication Sig Start Date End Date Taking? Authorizing Provider  ARISTADA  1064 MG/3.9ML prefilled syringe Inject 3.9 mLs into the muscle as directed. Every 2 months 06/19/22   [provider]  atorvastatin  (LIPITOR ) 20 MG tablet Take 1 tablet (20 mg total) by mouth daily. 05/16/23   Laurita Pillion, MD  clonazePAM  (KLONOPIN ) 0.5 MG tablet Take 0.5 mg by mouth daily.    [provider]  clopidogrel  (PLAVIX ) 75 MG tablet Take 1 tablet (75 mg total) by mouth daily. 05/16/23   Laurita Pillion, MD  gabapentin  (NEURONTIN ) 400 MG capsule Take 400 mg by mouth 3 (three) times daily. 04/23/23   [provider]  lamoTRIgine  (LAMICTAL ) 200 MG tablet Take 200 mg by mouth daily. 05/05/23   [provider]  levothyroxine  (SYNTHROID ) 125 MCG tablet Take 2 tablets (250 mcg total) by mouth daily at 6 (six) AM. Patient taking differently: Take 200 mcg by mouth daily at 6 (six) AM. 01/14/22 05/14/23  Kathrin Mignon DASEN,  MD  lisinopril  (ZESTRIL ) 20 MG tablet Take 1 tablet (20 mg total) by mouth daily. 01/16/22   Gonfa, Taye T, MD  meclizine  (ANTIVERT ) 25 MG tablet Take 1 tablet (25 mg total) by mouth 3 (three) times daily. 05/15/23   Laurita Pillion, MD  ondansetron  (ZOFRAN ) 4 MG tablet Take 4 mg by mouth every 6 (six) hours as needed. 04/23/23   [provider]  OZEMPIC, 1 MG/DOSE, 4 MG/3ML SOPN Inject 1 mg into the skin once a week. 12/23/21   [provider]  propranolol  (INDERAL ) 40 MG tablet Take 40 mg by mouth 2 (two) times daily. 03/26/23   [provider]  QUEtiapine  (SEROQUEL ) 300 MG tablet Take 300 mg by mouth at  bedtime. 12/30/21   [provider]  torsemide  (DEMADEX ) 20 MG tablet Take 1 tablet (20 mg total) by mouth daily. 01/20/22   Gonfa, Taye T, MD  traMADol  (ULTRAM ) 50 MG tablet Take 1 tablet by mouth every 6 (six) hours as needed. 10/09/21   [provider]    Physical Exam   Triage Vital Signs: ED Triage Vitals  Encounter Vitals Group     BP 08/19/23 0450 125/85     Girls Systolic BP Percentile --      Girls Diastolic BP Percentile --      Boys Systolic BP Percentile --      Boys Diastolic BP Percentile --      Pulse Rate 08/19/23 0450 75     Resp 08/19/23 0450 17     Temp 08/19/23 0450 97.7 F (36.5 C)     Temp Source 08/19/23 0450 Oral     SpO2 08/19/23 0450 100 %     Weight 08/19/23 0451 145 lb (65.8 kg)     Height 08/19/23 0451 5' (1.524 m)     Head Circumference --      Peak Flow --      Pain Score 08/19/23 0441 8     Pain Loc --      Pain Education --      Exclude from Growth Chart --      Most recent vital signs: Vitals:   08/19/23 0450 08/19/23 0730  BP: 125/85 106/71  Pulse: 75   Resp: 17 13  Temp: 97.7 F (36.5 C)   SpO2: 100%     CONSTITUTIONAL: Alert, responds appropriately to questions.  Chronically ill-appearing, appears uncomfortable HEAD: Normocephalic, atraumatic EYES: Conjunctivae clear, pupils appear equal, sclera nonicteric ENT: normal nose; moist mucous membranes NECK: Supple, normal ROM CARD: RRR; S1 and S2 appreciated RESP: Normal chest excursion without splinting or tachypnea; breath sounds clear and equal bilaterally; no wheezes, no rhonchi, no rales, no hypoxia or respiratory distress, speaking full sentences ABD/GI: Distended, hypoactive bowel sounds, soft, tender to palpation diffusely worse in the right lower quadrant without guarding or rebound BACK: The back appears normal EXT: Normal ROM in all joints; no deformity noted, 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   ED Results / Procedures / Treatments   LABS: (all labs ordered are listed, but only abnormal results are displayed) Labs Reviewed  COMPREHENSIVE METABOLIC PANEL WITH GFR - Abnormal; Notable for the following components:      Result Value   Albumin  3.4 (*)    All other components within normal limits  URINALYSIS, W/ REFLEX TO CULTURE (INFECTION SUSPECTED) - Abnormal; Notable for the following components:   Color, Urine YELLOW (*)    APPearance HAZY (*)    Specific Gravity, Urine >1.046 (*)    Hgb urine dipstick MODERATE (*)    Bacteria, UA RARE (*)    All other components within normal limits  CBC WITH DIFFERENTIAL/PLATELET  LIPASE, BLOOD  TSH  CBC     EKG:  EKG Interpretation Date/Time:  Wednesday August 19 2023 04:49:52 EDT Ventricular Rate:  75 PR Interval:  150 QRS Duration:  95 QT Interval:  392 QTC Calculation: 438 R Axis:   26  Text Interpretation: Sinus rhythm Nonspecific T abnormalities, anterior leads Confirmed by Neomi Neptune [phone]) on 08/19/2023 5:11:52 AM         RADIOLOGY: My personal review and interpretation of imaging: CT scan shows signs of ileus versus functional obstruction.  I have personally reviewed all radiology reports.   CT ABDOMEN PELVIS W CONTRAST Result Date: 08/19/2023 CLINICAL DATA:  Right lower quadrant abdominal pain.  Nausea. EXAM: CT ABDOMEN AND PELVIS WITH CONTRAST TECHNIQUE: Multidetector CT imaging of the abdomen and pelvis was performed using the standard protocol following bolus administration of intravenous contrast. RADIATION DOSE REDUCTION: This exam was performed according to the departmental dose-optimization program which includes automated exposure control, adjustment of the mA and/or kV according to patient size and/or use of iterative reconstruction  technique. CONTRAST:  OMNIPAQUE  IOHEXOL  300 MG/ML  SOLN COMPARISON:  04/01/2022 FINDINGS: Lower chest: Dependent atelectasis. Hepatobiliary: No suspicious focal abnormality within the liver parenchyma. Mild intrahepatic biliary duct dilatation. Common bile duct in the head of the pancreas measures up to 10 mm diameter, similar to prior. Cholecystectomy. Pancreas: No focal mass lesion. No dilatation of the main duct. No intraparenchymal cyst. No peripancreatic edema. Spleen: No splenomegaly. No suspicious focal mass lesion. Adrenals/Urinary Tract: No adrenal nodule or mass. Kidneys unremarkable. No evidence for hydroureter. The urinary bladder appears normal for the degree of distention. Stomach/Bowel: Surgical changes are noted in the stomach. Duodenum is normally positioned as is the ligament of Treitz. No small bowel wall thickening. No small bowel dilatation. Small bowel anastomosis noted left abdomen. The terminal ileum is normal. Cecum is identified in the anterior midline abdomen. Colon is diffusely dilated with gas and stool measuring up to 7.3 cm diameter. Sigmoid colon is distended with gas and stool down to the level of the rectum. No obstructing mass lesion evident. Vascular/Lymphatic: No abdominal aortic aneurysm. No abdominal aortic atherosclerotic calcification. There is no gastrohepatic or hepatoduodenal ligament lymphadenopathy. No retroperitoneal or mesenteric lymphadenopathy. No pelvic sidewall lymphadenopathy. Reproductive: Hysterectomy.  There is no adnexal mass. Other: No intraperitoneal free fluid. Musculoskeletal: No worrisome lytic or sclerotic osseous abnormality. Multiple soft tissue nodules in the sub cutaneous fat of the gluteal regions are likely injection granulomata. IMPRESSION: 1. Diffuse dilatation of the colon with gas and stool measuring up to 7.3 cm diameter. Sigmoid colon is distended with gas and stool down to the level of the rectum. No obstructing mass lesion evident.  Imaging features are compatible with colonic ileus/dysmotility. Functional obstruction not excluded. 2. Mild intrahepatic and extrahepatic biliary duct dilatation. Intrahepatic biliary duct dilatation is progressive in the interval although extrahepatic common bile duct dilatation is similar. Correlation with liver function test recommended. Electronically Signed   By: Camellia Candle M.D.   On: 08/19/2023 06:35     PROCEDURES:  Critical Care performed: No   CRITICAL CARE   Procedures  IMPRESSION / MDM / ASSESSMENT AND PLAN / ED COURSE  I reviewed the triage vital signs and the nursing notes.    Patient here for right lower quadrant abdominal pain, nausea.  The patient is on the cardiac monitor to evaluate for evidence of arrhythmia and/or significant heart rate changes.   DIFFERENTIAL DIAGNOSIS (includes but not limited to):   Appendicitis, UTI, kidney stone, ovarian cyst, ovarian torsion, diverticulitis, colitis, bowel obstruction   Patient's presentation is most consistent with acute presentation with potential threat to life or bodily function.   PLAN: Will obtain abdominal labs, urine, CT of the abdomen pelvis.  Will give morphine , Zofran .   MEDICATIONS GIVEN IN ED: Medications  0.9 %  sodium chloride  infusion ( Intravenous New Bag/Given 08/19/23 0706)  bisacodyl  (DULCOLAX) suppository 10 mg (has no administration in time range)  morphine  (PF) 4 MG/ML injection 4 mg (4 mg Intravenous Given 08/19/23 0513)  ondansetron  (ZOFRAN ) injection 4 mg (4 mg Intravenous Given 08/19/23 0513)  iohexol  (OMNIPAQUE ) 300 MG/ML solution 100 mL (100 mLs Intravenous Contrast Given 08/19/23 0607)  HYDROmorphone  (DILAUDID ) injection 1 mg (1 mg Intravenous Given 08/19/23 0711)  ondansetron  (ZOFRAN ) injection 4 mg (4 mg Intravenous Given 08/19/23 0703)     ED COURSE: Patient still appears quite uncomfortable but states symptoms have improved slightly.  Will give additional pain and nausea  medicine and start IV fluids.  Labs show no leukocytosis, normal electrolytes, renal function, LFTs and lipase.  CT scan reviewed and interpreted by myself and the radiologist and shows dilation of the colon up to 7.3 cm in diameter however there is gas and stool noted to the level of the rectum.  This does not appear to be bowel obstruction but could be ileus versus functional obstruction, dysmotility.  She does not have any history of Ogilvie's or similar symptoms.  We did discuss admission for pain control, bowel rest, IV hydration and possible motility studies, surgical consultation if symptoms or not improving.  Patient is comfortable with this plan.  I do not think that an NG tube is needed at this time given most of the dilation is within the colon and there is no small bowel dilatation and no significant amount of gas in the stomach.  She also is noted to have mild intra and extrahepatic biliary ductal dilatation but liver tests today are normal and no significant tenderness in the upper abdomen on reexamination.   CONSULTS:  Consulted and discussed patient's case with hospitalist, Dr. Cleatus.  I have recommended admission and consulting physician agrees and will place admission orders.  Patient (and family if present) agree with this plan.   I reviewed all nursing notes, vitals, pertinent previous records.  All labs, EKGs, imaging ordered have been independently reviewed and interpreted by myself.  7:59 AM  Spoke with Dr. Laurita with hospitalist service.  He agrees to admit the patient.  He states that she is now having bowel movements here in the ED but has bright red blood.  He will still admit her and consult gastroenterology.  Appreciate his help.  OUTSIDE RECORDS REVIEWED: Reviewed previous orthopedic notes.  Patient has history of chronic right-sided back pain, sciatica.       FINAL CLINICAL IMPRESSION(S) / ED DIAGNOSES   Final diagnoses:  RLQ abdominal pain  Ileus (HCC)   Rectal bleeding     Rx / DC Orders   ED Discharge Orders     None        Note:  This document  was prepared using Conservation officer, historic buildings and may include unintentional dictation errors.   Adian Jablonowski, Josette SAILOR, DO 08/19/23 [id number]    Uniqua Kihn, Josette SAILOR, DO 08/19/23 0800

## 2023-08-19 NOTE — Consult Note (Signed)
 Rhonda Copping, MD [facility]  [address] [place], [place] [address] Phone: [phone] Fax : [phone]  Consultation  Referring Provider:     Dr. Laurita Primary Care Physician:  Lorel Maxie LABOR, MD Primary Gastroenterologist: Sampson         Reason for Consultation:     Rectal bleeding and abdominal pain  Date of Admission:  08/19/2023 Date of Consultation:  08/19/2023         HPI:   Rhonda Palmer is a 53 y.o. female who has a history of heart failure hypertension, diabetes, schizophrenia, hypothyroidism and chronic back pain who came to the emergency department with right lower quadrant pain.  She states that she was thinking she was having an appendicitis attack.  She reports that she had never had this pain before and started about 3:30 in the morning.  The patient was then reported to have had a bowel movement at 5 AM prior to her CT scan with blood in the stools.  She reports that it was bright red blood mixed with the stools.  The patient had a CT scan that showed:  IMPRESSION: 1. Diffuse dilatation of the colon with gas and stool measuring up to 7.3 cm diameter. Sigmoid colon is distended with gas and stool down to the level of the rectum. No obstructing mass lesion evident. Imaging features are compatible with colonic ileus/dysmotility. Functional obstruction not excluded. 2. Mild intrahepatic and extrahepatic biliary duct dilatation. Intrahepatic biliary duct dilatation is progressive in the interval although extrahepatic common bile duct dilatation is similar. Correlation with liver function test recommended.  The patient's liver enzymes were normal.  The patient also reports that she had her gallbladder out in the past.  Past Medical History:  Diagnosis Date   (HFpEF) heart failure with preserved ejection fraction (HCC)    a. 02/2020 Echo: EF 55-60%, no rwma. Nl RV fxn.   Anxiety    Asthma    Bipolar 1 disorder (HCC)    Diabetes mellitus without  complication (HCC)    Edema leg for twenty years per patient   taking lasix  for edema   Fibromyalgia    History of cardiac catheterization    a. 02/2020 Cath: Nl cors. LVEDP -> torsemide  increased to 40mg  daily.   Hypertension    Hypothyroidism    Morbid obesity (HCC)    Ovarian cyst    Palpitations    a. 03/2020 Zio: RSR, avg HR 84 (55-203), occas SVT runs. No significant arrhythmias.  Most triggered events associate with sinus rhythm.   Schizo affective schizophrenia [facility])     Past Surgical History:  Procedure Laterality Date   ABDOMINAL HYSTERECTOMY     ANKLE SURGERY     pt. states long time ago   LAPAROTOMY N/A 01/05/2022   Procedure: EXPLORATORY LAPAROTOMY WITH REMOVALOF ABDOMINAL ABSCESS;  Surgeon: Tye Millet, DO;  Location: [facility] ORS;  Service: General;  Laterality: N/A;   LEFT HEART CATH AND CORONARY ANGIOGRAPHY N/A 03/13/2020   Procedure: LEFT HEART CATH AND CORONARY ANGIOGRAPHY;  Surgeon: Mady Bruckner, MD;  Location: [facility] INVASIVE CV LAB;  Service: Cardiovascular;  Laterality: N/A;    Prior to Admission medications   Medication Sig Start Date End Date Taking? Authorizing Provider  ARISTADA  1064 MG/3.9ML prefilled syringe Inject 3.9 mLs into the muscle as directed. Every 2 months 06/19/22  Yes [provider]  chlorhexidine (PERIDEX) 0.12 % solution Use as directed 5 mLs in the mouth or throat  as needed. Use as directed 5 mLs in the mouth or throat as needed. 05/27/23  Yes [provider]  clonazePAM  (KLONOPIN ) 0.5 MG tablet Take 0.5 mg by mouth daily.   Yes [provider]  clopidogrel  (PLAVIX ) 75 MG tablet Take 1 tablet (75 mg total) by mouth daily. 05/16/23  Yes Laurita Pillion, MD  lamoTRIgine  (LAMICTAL ) 200 MG tablet Take 200 mg by mouth daily. 05/05/23  Yes [provider]  levothyroxine  (SYNTHROID ) 200 MCG tablet Take 200 mcg by mouth daily before breakfast.   Yes [provider]  ondansetron  (ZOFRAN ) 4 MG tablet Take 4  mg by mouth every 6 (six) hours as needed. 04/23/23  Yes [provider]  OZEMPIC, 1 MG/DOSE, 4 MG/3ML SOPN Inject 1 mg into the skin once a week. 12/23/21  Yes [provider]  propranolol  (INDERAL ) 40 MG tablet Take 40 mg by mouth 2 (two) times daily. 03/26/23  Yes [provider]  QUEtiapine  (SEROQUEL ) 25 MG tablet Take 25 mg by mouth at bedtime. 07/21/23  Yes [provider]  QUEtiapine  (SEROQUEL ) 300 MG tablet Take 600 mg by mouth at bedtime. 12/30/21  Yes [provider]  traMADol  (ULTRAM ) 50 MG tablet Take 1 tablet by mouth every 6 (six) hours as needed. 10/09/21  Yes [provider]  atorvastatin  (LIPITOR ) 20 MG tablet Take 1 tablet (20 mg total) by mouth daily. Patient not taking: Reported on 08/19/2023 05/16/23   Laurita Pillion, MD  lisinopril  (ZESTRIL ) 20 MG tablet Take 1 tablet (20 mg total) by mouth daily. Patient not taking: Reported on 08/19/2023 01/16/22   Gonfa, Taye T, MD  meclizine  (ANTIVERT ) 25 MG tablet Take 1 tablet (25 mg total) by mouth 3 (three) times daily. Patient not taking: Reported on 08/19/2023 05/15/23   Laurita Pillion, MD  torsemide  (DEMADEX ) 20 MG tablet Take 1 tablet (20 mg total) by mouth daily. Patient not taking: Reported on 08/19/2023 01/20/22   Gonfa, Taye T, MD    Family History  Problem Relation Age of Onset   Heart failure Mother    Bladder Cancer Neg Hx    Kidney cancer Neg Hx      Social History   Tobacco Use   Smoking status: Never   Smokeless tobacco: Never  Vaping Use   Vaping status: Never Used  Substance Use Topics   Alcohol use: No   Drug use: No    Allergies as of 08/19/2023 - Review Complete 08/19/2023  Allergen Reaction Noted   Codeine Hives, Itching, Swelling, and Rash 05/01/2012   Sulfa antibiotics Anaphylaxis 12/08/2015   Penicillins Itching 08/12/2014   Aspirin  Rash 12/19/2012    Review of Systems:    All systems reviewed and negative except where noted in HPI.   Physical  Exam:  Vital signs in last 24 hours: Temp:  [97.7 F (36.5 C)] 97.7 F (36.5 C) (07/23 0450) Pulse Rate:  [75-78] 78 (07/23 1035) Resp:  [13-17] 13 (07/23 1035) BP: (106-125)/(71-85) 107/81 (07/23 1035) SpO2:  [93 %-100 %] 93 % (07/23 1035) Weight:  [65.8 kg] 65.8 kg (07/23 0451)   General:   Pleasant, cooperative in NAD Head:  Normocephalic and atraumatic. Eyes:   No icterus.   Conjunctiva pink. PERRLA. Ears:  Normal auditory acuity. Neck:  Supple; no masses or thyroidomegaly Lungs: Respirations even and unlabored. Lungs clear to auscultation bilaterally.   No wheezes, crackles, or rhonchi.  Heart:  Regular rate and rhythm;  Without murmur, clicks, rubs or gallops Abdomen:  Soft,  nondistended, positive tenderness to 1 finger palpation while flexing the abdominal wall muscles. Normal bowel sounds. No appreciable masses or hepatomegaly.  No rebound or guarding.  Rectal:  Not performed. Msk:  Symmetrical without gross deformities.    Extremities:  Without edema, cyanosis or clubbing. Neurologic:  Alert and oriented x3;  grossly normal neurologically. Skin:  Intact without significant lesions or rashes. Cervical Nodes:  No significant cervical adenopathy. Psych:  Alert and cooperative. Normal affect.  LAB RESULTS: Recent Labs    08/19/23 0517 08/19/23 1035  WBC 5.4  --   HGB 12.4 11.0*  HCT 40.0 35.3*  PLT 216  --    BMET Recent Labs    08/19/23 0517  NA 138  K 3.8  CL 106  CO2 27  GLUCOSE 93  BUN 15  CREATININE 0.54  CALCIUM  9.2   LFT Recent Labs    08/19/23 0517  PROT 6.7  ALBUMIN  3.4*  AST 22  ALT 18  ALKPHOS 104  BILITOT 0.6   PT/INR No results for input(s): LABPROT, INR in the last 72 hours.  STUDIES: CT ABDOMEN PELVIS W CONTRAST Result Date: 08/19/2023 CLINICAL DATA:  Right lower quadrant abdominal pain.  Nausea. EXAM: CT ABDOMEN AND PELVIS WITH CONTRAST TECHNIQUE: Multidetector CT imaging of the abdomen and pelvis was performed using the  standard protocol following bolus administration of intravenous contrast. RADIATION DOSE REDUCTION: This exam was performed according to the departmental dose-optimization program which includes automated exposure control, adjustment of the mA and/or kV according to patient size and/or use of iterative reconstruction technique. CONTRAST:  OMNIPAQUE  IOHEXOL  300 MG/ML  SOLN COMPARISON:  04/01/2022 FINDINGS: Lower chest: Dependent atelectasis. Hepatobiliary: No suspicious focal abnormality within the liver parenchyma. Mild intrahepatic biliary duct dilatation. Common bile duct in the head of the pancreas measures up to 10 mm diameter, similar to prior. Cholecystectomy. Pancreas: No focal mass lesion. No dilatation of the main duct. No intraparenchymal cyst. No peripancreatic edema. Spleen: No splenomegaly. No suspicious focal mass lesion. Adrenals/Urinary Tract: No adrenal nodule or mass. Kidneys unremarkable. No evidence for hydroureter. The urinary bladder appears normal for the degree of distention. Stomach/Bowel: Surgical changes are noted in the stomach. Duodenum is normally positioned as is the ligament of Treitz. No small bowel wall thickening. No small bowel dilatation. Small bowel anastomosis noted left abdomen. The terminal ileum is normal. Cecum is identified in the anterior midline abdomen. Colon is diffusely dilated with gas and stool measuring up to 7.3 cm diameter. Sigmoid colon is distended with gas and stool down to the level of the rectum. No obstructing mass lesion evident. Vascular/Lymphatic: No abdominal aortic aneurysm. No abdominal aortic atherosclerotic calcification. There is no gastrohepatic or hepatoduodenal ligament lymphadenopathy. No retroperitoneal or mesenteric lymphadenopathy. No pelvic sidewall lymphadenopathy. Reproductive: Hysterectomy.  There is no adnexal mass. Other: No intraperitoneal free fluid. Musculoskeletal: No worrisome lytic or sclerotic osseous abnormality. Multiple  soft tissue nodules in the sub cutaneous fat of the gluteal regions are likely injection granulomata. IMPRESSION: 1. Diffuse dilatation of the colon with gas and stool measuring up to 7.3 cm diameter. Sigmoid colon is distended with gas and stool down to the level of the rectum. No obstructing mass lesion evident. Imaging features are compatible with colonic ileus/dysmotility. Functional obstruction not excluded. 2. Mild intrahepatic and extrahepatic biliary duct dilatation. Intrahepatic biliary duct dilatation is progressive in the interval although extrahepatic common bile duct dilatation is similar. Correlation with liver function test recommended. Electronically Signed   By: Camellia  Minus M.D.   On: 08/19/2023 06:35      Impression / Plan:   Assessment: Principal Problem:   GI bleed Active Problems:   Abdominal pain   Lower GI bleed   Rhonda Palmer is a 53 y.o. y/o female with abdominal pain rectal bleeding and colonic ileus.  The patient has some musculoskeletal pain as a component of her abdominal pain with the pain being present with flexion of the abdominal wall muscles while palpating the abdominal wall with 1 finger.  Having said that that does not explain the colonic ileus nor does it explain the rectal bleeding.  The CT scan did not show anything in the right side of the abdomen where her pain is reported.  The patient's hemoglobin in was 12.4 admission and this morning was 11.0.  Plan:  The patient will be given a colonic prep to help clean out her colon to see if the colonic ileus resolves and to see if there is any further blood that is washed out.  If the patient's abdominal discomfort improves and she is able to take the prep and gets cleaned out then we may consider doing a colonoscopy.  The patient's bile duct dilatation should be followed as an outpatient and with the normal liver enzymes at this time this is unlikely cause of any of her issues.  The patient has been  explained the plan and agrees with it.  Thank you for involving me in the care of this patient.      LOS: 0 days   Rhonda Copping, MD, MD. NOLIA 08/19/2023, 11:48 AM,  Pager [phone] 7am-5pm  Check AMION for 5pm -7am coverage and on weekends   Note: This dictation was prepared with Dragon dictation along with smaller phrase technology. Any transcriptional errors that result from this process are unintentional.

## 2023-08-19 NOTE — Progress Notes (Signed)
 Hb trending 12>11.0>11.0, no more rectal bleeding but still significant pain.

## 2023-08-19 NOTE — H&P (Signed)
 History and Physical    Rhonda Palmer FMW:[id number] DOB: 27-Aug-1970 DOA: 08/19/2023  PCP: Lorel Maxie LABOR, MD (Confirm with patient/family/NH records and if not entered, this has to be entered at [facility] point of entry) Patient coming from: Home  I have personally briefly reviewed patient's old medical records in [facility]  Chief Complaint: Abd pain and lower GI bleed  HPI: Rhonda Palmer is a 53 y.o. female with medical history significant of TIA on Plavix , IIDM, hypothyroidism, anxiety/depression, chronic back pain, chronic pain syndrome presented with sudden onset of abdominal pain and lower GI bleed.  Patient was doing fine until yesterday evening, she started to have severe right lower quadrant abdominal pain sharp like constant, with nausea but no vomiting.  She felt  must be my appendix and came to ED.  Overnight, she passed gas and this morning she passed a large bowel movement, with hard stool and coated with bright red blood and left lower quadrant abdominal pain persisted after the bowel movement.  Denied any history of abdominal pain of similar nature, no history of previous colonoscopies.  Patient has been Ozempic for weight loss, managed about 35 pound loss in the last 6 months.  No history of hemorrhoids.  ED Course: Afebrile, nontachycardic nonhypotensive not hypoxic.  CT abdomen pelvis showed: Ileus with large stool and gas, distended sigmoid colon no obstruction identified.  Blood work showed hemoglobin 12.4 WBC 5.4 BUN 15 creatinine 0.5 glucose 93.  Review of Systems: As per HPI otherwise 14 point review of systems negative.    Past Medical History:  Diagnosis Date   (HFpEF) heart failure with preserved ejection fraction (HCC)    a. 02/2020 Echo: EF 55-60%, no rwma. Nl RV fxn.   Anxiety    Asthma    Bipolar 1 disorder (HCC)    Diabetes mellitus without complication (HCC)    Edema leg for twenty years per patient   taking lasix  for edema   Fibromyalgia     History of cardiac catheterization    a. 02/2020 Cath: Nl cors. LVEDP -> torsemide  increased to 40mg  daily.   Hypertension    Hypothyroidism    Morbid obesity (HCC)    Ovarian cyst    Palpitations    a. 03/2020 Zio: RSR, avg HR 84 (55-203), occas SVT runs. No significant arrhythmias.  Most triggered events associate with sinus rhythm.   Schizo affective schizophrenia [facility])     Past Surgical History:  Procedure Laterality Date   ABDOMINAL HYSTERECTOMY     ANKLE SURGERY     pt. states long time ago   LAPAROTOMY N/A 01/05/2022   Procedure: EXPLORATORY LAPAROTOMY WITH REMOVALOF ABDOMINAL ABSCESS;  Surgeon: Tye Millet, DO;  Location: [facility] ORS;  Service: General;  Laterality: N/A;   LEFT HEART CATH AND CORONARY ANGIOGRAPHY N/A 03/13/2020   Procedure: LEFT HEART CATH AND CORONARY ANGIOGRAPHY;  Surgeon: Mady Bruckner, MD;  Location: [facility] INVASIVE CV LAB;  Service: Cardiovascular;  Laterality: N/A;     reports that she has never smoked. She has never used smokeless tobacco. She reports that she does not drink alcohol and does not use drugs.  Allergies  Allergen Reactions   Codeine Hives, Itching, Swelling and Rash    Takes oxycodone , morphine     Sulfa Antibiotics Anaphylaxis   Penicillins Itching    TOLERATED CEFAZOLIN    Aspirin  Rash    Family History  Problem Relation Age of Onset   Heart failure Mother    Bladder Cancer Neg  Hx    Kidney cancer Neg Hx      Prior to Admission medications   Medication Sig Start Date End Date Taking? Authorizing Provider  ARISTADA  1064 MG/3.9ML prefilled syringe Inject 3.9 mLs into the muscle as directed. Every 2 months 06/19/22  Yes [provider]  chlorhexidine (PERIDEX) 0.12 % solution Use as directed 5 mLs in the mouth or throat as needed. Use as directed 5 mLs in the mouth or throat as needed. 05/27/23  Yes [provider]  clonazePAM  (KLONOPIN ) 0.5 MG tablet Take 0.5 mg by mouth daily.   Yes [provider]   clopidogrel  (PLAVIX ) 75 MG tablet Take 1 tablet (75 mg total) by mouth daily. 05/16/23  Yes Laurita Pillion, MD  lamoTRIgine  (LAMICTAL ) 200 MG tablet Take 200 mg by mouth daily. 05/05/23  Yes [provider]  levothyroxine  (SYNTHROID ) 200 MCG tablet Take 200 mcg by mouth daily before breakfast.   Yes [provider]  ondansetron  (ZOFRAN ) 4 MG tablet Take 4 mg by mouth every 6 (six) hours as needed. 04/23/23  Yes [provider]  OZEMPIC, 1 MG/DOSE, 4 MG/3ML SOPN Inject 1 mg into the skin once a week. 12/23/21  Yes [provider]  propranolol  (INDERAL ) 40 MG tablet Take 40 mg by mouth 2 (two) times daily. 03/26/23  Yes [provider]  QUEtiapine  (SEROQUEL ) 25 MG tablet Take 25 mg by mouth at bedtime. 07/21/23  Yes [provider]  QUEtiapine  (SEROQUEL ) 300 MG tablet Take 600 mg by mouth at bedtime. 12/30/21  Yes [provider]  traMADol  (ULTRAM ) 50 MG tablet Take 1 tablet by mouth every 6 (six) hours as needed. 10/09/21  Yes [provider]  atorvastatin  (LIPITOR ) 20 MG tablet Take 1 tablet (20 mg total) by mouth daily. Patient not taking: Reported on 08/19/2023 05/16/23   Laurita Pillion, MD  lisinopril  (ZESTRIL ) 20 MG tablet Take 1 tablet (20 mg total) by mouth daily. Patient not taking: Reported on 08/19/2023 01/16/22   Gonfa, Taye T, MD  meclizine  (ANTIVERT ) 25 MG tablet Take 1 tablet (25 mg total) by mouth 3 (three) times daily. Patient not taking: Reported on 08/19/2023 05/15/23   Laurita Pillion, MD  torsemide  (DEMADEX ) 20 MG tablet Take 1 tablet (20 mg total) by mouth daily. Patient not taking: Reported on 08/19/2023 01/20/22   Kathrin Mignon DASEN, MD    Physical Exam: Vitals:   08/19/23 0450 08/19/23 0451 08/19/23 0730  BP: 125/85  106/71  Pulse: 75    Resp: 17  13  Temp: 97.7 F (36.5 C)    TempSrc: Oral    SpO2: 100%    Weight:  65.8 kg   Height:  5' (1.524 m)     Constitutional: NAD, calm, comfortable Vitals:   08/19/23  0450 08/19/23 0451 08/19/23 0730  BP: 125/85  106/71  Pulse: 75    Resp: 17  13  Temp: 97.7 F (36.5 C)    TempSrc: Oral    SpO2: 100%    Weight:  65.8 kg   Height:  5' (1.524 m)    Eyes: PERRL, lids and conjunctivae normal ENMT: Mucous membranes are moist. Posterior pharynx clear of any exudate or lesions.Normal dentition.  Neck: normal, supple, no masses, no thyromegaly Respiratory: clear to auscultation bilaterally, no wheezing, no crackles. Normal respiratory effort. No accessory muscle use.  Cardiovascular: Regular rate and rhythm, no murmurs / rubs / gallops. No extremity edema. 2+ pedal pulses. No carotid bruits.  Abdomen: RLQ tenderness, no  guarding no rebound, no masses palpated. No hepatosplenomegaly. Bowel sounds positive.  Musculoskeletal: no clubbing / cyanosis. No joint deformity upper and lower extremities. Good ROM, no contractures. Normal muscle tone.  Skin: no rashes, lesions, ulcers. No induration Neurologic: CN 2-12 grossly intact. Sensation intact, DTR normal. Strength 5/5 in all 4.  Psychiatric: Normal judgment and insight. Alert and oriented x 3. Normal mood.     Labs on Admission: I have personally reviewed following labs and imaging studies  CBC: Recent Labs  Lab 08/19/23 0517  WBC 5.4  NEUTROABS 2.8  HGB 12.4  HCT 40.0  MCV 92.4  PLT 216   Basic Metabolic Panel: Recent Labs  Lab 08/19/23 0517  NA 138  K 3.8  CL 106  CO2 27  GLUCOSE 93  BUN 15  CREATININE 0.54  CALCIUM  9.2   GFR: Estimated Creatinine Clearance: 67.2 mL/min (by C-G formula based on SCr of 0.54 mg/dL). Liver Function Tests: Recent Labs  Lab 08/19/23 0517  AST 22  ALT 18  ALKPHOS 104  BILITOT 0.6  PROT 6.7  ALBUMIN  3.4*   Recent Labs  Lab 08/19/23 0517  LIPASE 22   No results for input(s): AMMONIA in the last 168 hours. Coagulation Profile: No results for input(s): INR, PROTIME in the last 168 hours. Cardiac Enzymes: No results for input(s):  CKTOTAL, CKMB, CKMBINDEX, TROPONINI in the last 168 hours. BNP (last 3 results) No results for input(s): PROBNP in the last 8760 hours. HbA1C: No results for input(s): HGBA1C in the last 72 hours. CBG: No results for input(s): GLUCAP in the last 168 hours. Lipid Profile: No results for input(s): CHOL, HDL, LDLCALC, TRIG, CHOLHDL, LDLDIRECT in the last 72 hours. Thyroid  Function Tests: Recent Labs    08/19/23 0517  TSH 2.123   Anemia Panel: No results for input(s): VITAMINB12, FOLATE, FERRITIN, TIBC, IRON, RETICCTPCT in the last 72 hours. Urine analysis:    Component Value Date/Time   COLORURINE YELLOW (A) 08/19/2023 0716   APPEARANCEUR HAZY (A) 08/19/2023 0716   APPEARANCEUR Clear 04/29/2017 0918   LABSPEC >1.046 (H) 08/19/2023 0716   PHURINE 5.0 08/19/2023 0716   GLUCOSEU NEGATIVE 08/19/2023 0716   HGBUR MODERATE (A) 08/19/2023 0716   BILIRUBINUR NEGATIVE 08/19/2023 0716   BILIRUBINUR Negative 04/29/2017 0918   KETONESUR NEGATIVE 08/19/2023 0716   PROTEINUR NEGATIVE 08/19/2023 0716   NITRITE NEGATIVE 08/19/2023 0716   LEUKOCYTESUR NEGATIVE 08/19/2023 0716    Radiological Exams on Admission: CT ABDOMEN PELVIS W CONTRAST Result Date: 08/19/2023 CLINICAL DATA:  Right lower quadrant abdominal pain.  Nausea. EXAM: CT ABDOMEN AND PELVIS WITH CONTRAST TECHNIQUE: Multidetector CT imaging of the abdomen and pelvis was performed using the standard protocol following bolus administration of intravenous contrast. RADIATION DOSE REDUCTION: This exam was performed according to the departmental dose-optimization program which includes automated exposure control, adjustment of the mA and/or kV according to patient size and/or use of iterative reconstruction technique. CONTRAST:  OMNIPAQUE  IOHEXOL  300 MG/ML  SOLN COMPARISON:  04/01/2022 FINDINGS: Lower chest: Dependent atelectasis. Hepatobiliary: No suspicious focal abnormality within the liver  parenchyma. Mild intrahepatic biliary duct dilatation. Common bile duct in the head of the pancreas measures up to 10 mm diameter, similar to prior. Cholecystectomy. Pancreas: No focal mass lesion. No dilatation of the main duct. No intraparenchymal cyst. No peripancreatic edema. Spleen: No splenomegaly. No suspicious focal mass lesion. Adrenals/Urinary Tract: No adrenal nodule or mass. Kidneys unremarkable. No evidence for hydroureter. The urinary bladder appears normal for the degree of distention.  Stomach/Bowel: Surgical changes are noted in the stomach. Duodenum is normally positioned as is the ligament of Treitz. No small bowel wall thickening. No small bowel dilatation. Small bowel anastomosis noted left abdomen. The terminal ileum is normal. Cecum is identified in the anterior midline abdomen. Colon is diffusely dilated with gas and stool measuring up to 7.3 cm diameter. Sigmoid colon is distended with gas and stool down to the level of the rectum. No obstructing mass lesion evident. Vascular/Lymphatic: No abdominal aortic aneurysm. No abdominal aortic atherosclerotic calcification. There is no gastrohepatic or hepatoduodenal ligament lymphadenopathy. No retroperitoneal or mesenteric lymphadenopathy. No pelvic sidewall lymphadenopathy. Reproductive: Hysterectomy.  There is no adnexal mass. Other: No intraperitoneal free fluid. Musculoskeletal: No worrisome lytic or sclerotic osseous abnormality. Multiple soft tissue nodules in the sub cutaneous fat of the gluteal regions are likely injection granulomata. IMPRESSION: 1. Diffuse dilatation of the colon with gas and stool measuring up to 7.3 cm diameter. Sigmoid colon is distended with gas and stool down to the level of the rectum. No obstructing mass lesion evident. Imaging features are compatible with colonic ileus/dysmotility. Functional obstruction not excluded. 2. Mild intrahepatic and extrahepatic biliary duct dilatation. Intrahepatic biliary duct  dilatation is progressive in the interval although extrahepatic common bile duct dilatation is similar. Correlation with liver function test recommended. Electronically Signed   By: Camellia Candle M.D.   On: 08/19/2023 06:35    EKG: Independently reviewed.  Sinus rhythm, no acute ST changes.  Assessment/Plan Principal Problem:   GI bleed Active Problems:   Abdominal pain   Lower GI bleed  (please populate well all problems here in Problem List. (For example, if patient is on BP meds at home and you resume or decide to hold them, it is a problem that needs to be her. Same for CAD, COPD, HLD and so on)  Colon ileus Hematochezia - Etiology less clear, inflammatory, neoplasm?  NPODiscussed with on-call GI Dr. Jinny, who will see the patient for consult this morning. -Repeat H&H this morning -Low suspicion for upper GI bleed.  IIDM -SSI for now  Hx of TIA - Hold off Plavix  for possible incoming colonoscopy  HTN - Continue propranolol   Hypothyroidism - Check TSH, continue Synthroid   Weight loss - Has been on diet and Ozempic therapy and managed to lose about 35 pounds in last 6 months.  DVT prophylaxis: SCD Code Status: Full code Family Communication: None at bedside Disposition Plan: Patient is sick with abdominal pain and lower GI bleed requiring inpatient GI consultation, expect more than 2 midnight hospital stay Consults called: GI Admission status: Tele admit   Cort ONEIDA Mana MD [facility] Pager [phone]  08/19/2023, 9:25 AM

## 2023-08-20 ENCOUNTER — Encounter
Admission: EM | Disposition: A | Discharge status: Home / Self Care | Payer: Self-pay | Source: Home / Self Care | Attending: Internal Medicine

## 2023-08-20 ENCOUNTER — Inpatient Hospital Stay: Payer: MEDICAID | Admitting: Anesthesiology

## 2023-08-20 ENCOUNTER — Encounter: Payer: Self-pay | Admitting: Internal Medicine

## 2023-08-20 DIAGNOSIS — R1031 Right lower quadrant pain: Secondary | ICD-10-CM

## 2023-08-20 DIAGNOSIS — K641 Second degree hemorrhoids: Principal | ICD-10-CM

## 2023-08-20 DIAGNOSIS — K922 Gastrointestinal hemorrhage, unspecified: Secondary | ICD-10-CM | POA: Diagnosis not present

## 2023-08-20 HISTORY — PX: COLONOSCOPY: SHX5424

## 2023-08-20 LAB — BASIC METABOLIC PANEL WITH GFR
Anion gap: 10 (ref 5–15)
BUN: 7 mg/dL (ref 6–20)
CO2: 26 mmol/L (ref 22–32)
Calcium: 9 mg/dL (ref 8.9–10.3)
Chloride: 108 mmol/L (ref 98–111)
Creatinine, Ser: 0.6 mg/dL (ref 0.44–1.00)
GFR, Estimated: >60 mL/min (ref >60)
Glucose, Bld: 91 mg/dL (ref 70–99)
Potassium: 3.5 mmol/L (ref 3.5–5.1)
Sodium: 144 mmol/L (ref 135–145)

## 2023-08-20 LAB — CBC
HCT: 38.2 % (ref 36.0–46.0)
Hemoglobin: 11.9 g/dL — ABNORMAL LOW (ref 12.0–15.0)
MCH: 29 pg (ref 26.0–34.0)
MCHC: 31.2 g/dL (ref 30.0–36.0)
MCV: 92.9 fL (ref 80.0–100.0)
Platelets: 191 K/uL (ref 150–400)
RBC: 4.11 MIL/uL (ref 3.87–5.11)
RDW: 13.7 % (ref 11.5–15.5)
WBC: 4.1 K/uL (ref 4.0–10.5)
nRBC: 0 % (ref 0.0–0.2)

## 2023-08-20 LAB — HIV ANTIBODY (ROUTINE TESTING W REFLEX): HIV Screen 4th Generation wRfx: NONREACTIVE

## 2023-08-20 SURGERY — COLONOSCOPY
Anesthesia: General

## 2023-08-20 MED ORDER — LIDOCAINE HCL (CARDIAC) PF 100 MG/5ML IV SOSY
PREFILLED_SYRINGE | INTRAVENOUS | Status: DC | PRN
  Administered 2023-08-20: 60 mg via INTRAVENOUS

## 2023-08-20 MED ORDER — SODIUM CHLORIDE 0.9 % IV SOLN: INTRAVENOUS | Status: DC

## 2023-08-20 MED ORDER — DEXMEDETOMIDINE HCL IN NACL 80 MCG/20ML IV SOLN
INTRAVENOUS | Status: DC | PRN
Start: 2023-08-20 — End: 2023-08-20
  Administered 2023-08-20: 12 ug via INTRAVENOUS
  Administered 2023-08-20: 8 ug via INTRAVENOUS

## 2023-08-20 MED ORDER — CLONAZEPAM 0.5 MG PO TABS
0.5000 mg | ORAL_TABLET | Freq: Once | ORAL | Status: AC
  Administered 2023-08-20: 0.5 mg via ORAL
  Filled 2023-08-20: qty 1

## 2023-08-20 MED ORDER — METHOCARBAMOL 1000 MG/10ML IJ SOLN
1000.0000 mg | Freq: Once | INTRAMUSCULAR | Status: AC
  Administered 2023-08-20: 1000 mg via INTRAVENOUS
  Filled 2023-08-20: qty 10

## 2023-08-20 MED ORDER — PROPOFOL 10 MG/ML IV BOLUS
INTRAVENOUS | Status: DC | PRN
  Administered 2023-08-20 (×2): 50 mg via INTRAVENOUS

## 2023-08-20 MED ORDER — PROPOFOL 500 MG/50ML IV EMUL
INTRAVENOUS | Status: DC | PRN
  Administered 2023-08-20: 75 ug/kg/min via INTRAVENOUS

## 2023-08-20 NOTE — Transfer of Care (Signed)
 Immediate Anesthesia Transfer of Care Note  Patient: Rhonda Palmer  Procedure(s) Performed: COLONOSCOPY  Patient Location: PACU  Anesthesia Type:General  Level of Consciousness: sedated  Airway & Oxygen Therapy: Patient Spontanous Breathing  Post-op Assessment: Report given to RN and Post -op Vital signs reviewed and stable  Post vital signs: Reviewed and stable  Last Vitals:  Vitals Value Taken Time  BP 107/66 08/20/23 14:42  Temp    Pulse 78 08/20/23 14:42  Resp 21 08/20/23 14:42  SpO2 97 % 08/20/23 14:42  Vitals shown include unfiled device data.  Last Pain:  Vitals:   08/20/23 1339  TempSrc: Oral  PainSc: 5          Complications: No notable events documented.

## 2023-08-20 NOTE — Anesthesia Preprocedure Evaluation (Addendum)
 Anesthesia Evaluation  Patient identified by MRN, date of birth, ID band Patient awake    Reviewed: Allergy & Precautions, NPO status , Patient's Chart, lab work & pertinent test results  Airway Mallampati: III  TM Distance: >3 FB Neck ROM: full    Dental  (+) Chipped   Pulmonary neg pulmonary ROS   Pulmonary exam normal        Cardiovascular hypertension, +CHF (preserved EF)  negative cardio ROS Normal cardiovascular exam  ECG 08/19/23:  Sinus rhythm Nonspecific T abnormalities, anterior leads   Neuro/Psych  Headaches PSYCHIATRIC DISORDERS Anxiety Depression Bipolar Disorder Schizophrenia  TIAnegative neurological ROS  negative psych ROS   GI/Hepatic negative GI ROS, Neg liver ROS,GERD  ,,  Endo/Other  negative endocrine ROSdiabetes, Type 2Hypothyroidism    Renal/GU negative Renal ROS  negative genitourinary   Musculoskeletal  (+)  Fibromyalgia -  Abdominal   Peds  Hematology negative hematology ROS (+)   Anesthesia Other Findings Past Medical History: No date: (HFpEF) heart failure with preserved ejection fraction (HCC)     Comment:  a. 02/2020 Echo: EF 55-60%, no rwma. Nl RV fxn. No date: Anxiety No date: Asthma No date: Bipolar 1 disorder (HCC) No date: Diabetes mellitus without complication (HCC) for twenty years per patient: Edema leg     Comment:  taking lasix  for edema No date: Fibromyalgia No date: History of cardiac catheterization     Comment:  a. 02/2020 Cath: Nl cors. LVEDP -> torsemide                increased to 40mg  daily. No date: Hypertension No date: Hypothyroidism No date: Morbid obesity (HCC) No date: Ovarian cyst No date: Palpitations     Comment:  a. 03/2020 Zio: RSR, avg HR 84 (55-203), occas SVT runs.               No significant arrhythmias.  Most triggered events               associate with sinus rhythm. No date: Schizo affective schizophrenia [facility])  Past Surgical  History: No date: ABDOMINAL HYSTERECTOMY No date: ANKLE SURGERY     Comment:  pt. states long time ago 01/05/2022: LAPAROTOMY; N/A     Comment:  Procedure: EXPLORATORY LAPAROTOMY WITH REMOVALOF               ABDOMINAL ABSCESS;  Surgeon: Tye Millet, DO;  Location:              [facility] ORS;  Service: General;  Laterality: N/A; 03/13/2020: LEFT HEART CATH AND CORONARY ANGIOGRAPHY; N/A     Comment:  Procedure: LEFT HEART CATH AND CORONARY ANGIOGRAPHY;                Surgeon: Mady Bruckner, MD;  Location: [facility] INVASIVE               CV LAB;  Service: Cardiovascular;  Laterality: N/A;  BMI    Body Mass Index: 28.32 kg/m      Reproductive/Obstetrics negative OB ROS                              Anesthesia Physical Anesthesia Plan  ASA: 3  Anesthesia Plan: General   Post-op Pain Management: Minimal or no pain anticipated   Induction: Intravenous  PONV Risk Score and Plan: 2 and Propofol  infusion, TIVA and Ondansetron   Airway Management Planned: Natural Airway  Additional Equipment: None  Intra-op Plan:  Post-operative Plan:   Informed Consent: I have reviewed the patients History and Physical, chart, labs and discussed the procedure including the risks, benefits and alternatives for the proposed anesthesia with the patient or authorized representative who has indicated his/her understanding and acceptance.     Dental advisory given  Plan Discussed with: CRNA and Surgeon  Anesthesia Plan Comments: (Discussed risks of anesthesia with patient, including possibility of difficulty with spontaneous ventilation under anesthesia necessitating airway intervention, PONV, and rare risks such as cardiac or respiratory or neurological events, and allergic reactions. Discussed the role of CRNA in patient's perioperative care. Patient understands.)         Anesthesia Quick Evaluation

## 2023-08-20 NOTE — Progress Notes (Signed)
 PROGRESS NOTE    Rhonda Palmer  FMW:[id number] DOB: [birth year] DOA: 08/19/2023 PCP: Lorel Maxie LABOR, MD    Brief Narrative:  53 y.o. female with medical history significant of TIA on Plavix , IIDM, hypothyroidism, anxiety/depression, chronic back pain, chronic pain syndrome presented with sudden onset of abdominal pain and lower GI bleed.   Patient was doing fine until yesterday evening, she started to have severe right lower quadrant abdominal pain sharp like constant, with nausea but no vomiting.  She felt  must be my appendix and came to ED.  Overnight, she passed gas and this morning she passed a large bowel movement, with hard stool and coated with bright red blood and left lower quadrant abdominal pain persisted after the bowel movement.  Denied any history of abdominal pain of similar nature, no history of previous colonoscopies.  Patient has been Ozempic for weight loss, managed about 35 pound loss in the last 6 months.  No history of hemorrhoids.  Seen in consultation by GI.  Plan for diagnostic colonoscopy 7/24  Assessment & Plan:   Principal Problem:   GI bleed Active Problems:   Abdominal pain   Lower GI bleed   Right lower quadrant pain   Rectal bleeding   RLQ abdominal pain  Colon ileus Hematochezia Intractable abdominal pain Abdominal pain is not entirely of clear etiology. Could be due to ileus GI consulted Plan: Patient completed prep.  Colonoscopy planned this afternoon Attempt muscle relaxant for pain relief Avoid narcotics given constipating effect  Intra and extrahepatic biliary ductal dilatation Unclear etiology.  No obstructing stones.  No elevation in transaminitis suggest word no elevation in liver enzymes or bilirubin to suggest CBD pathology.  This will need to be followed as outpatient.   IIDM Suicide for now   Hx of TIA Plavix  on hold for now   HTN Continue propranolol    Hypothyroidism Continue Synthroid    Weight loss Intentional.   Has been on Ozempic with good result.   DVT prophylaxis: On hold Code Status: Full Family Communication: None Disposition Plan: Status is: Inpatient Remains inpatient appropriate because: Intractable abdominal pain.  Colonic ileus.  GI bleed   Level of care: Telemetry Medical  Consultants:  GI  Procedures:  Colonoscopy  Antimicrobials: None   Subjective: Seen and examined.  Continues to endorse severe right-sided abdominal pain.  Objective: Vitals:   08/19/23 1733 08/19/23 1919 08/20/23 0410 08/20/23 0821  BP: 106/72 103/69 103/73 (!) 141/92  Pulse: 75 71 68 90  Resp: 12 16 16 16   Temp: 97.7 F (36.5 C) 98.4 F (36.9 C) 97.6 F (36.4 C) 97.7 F (36.5 C)  TempSrc:  Oral Oral Oral  SpO2: 97% 92% 94% 95%  Weight:      Height:        Intake/Output Summary (Last 24 hours) at 08/20/2023 1336 Last data filed at 08/20/2023 0818 Gross per 24 hour  Intake 2576.83 ml  Output --  Net 2576.83 ml   Filed Weights   08/19/23 0451  Weight: 65.8 kg    Examination:  General exam: NAD Respiratory system: Clear to auscultation. Respiratory effort normal. Cardiovascular system: S1-S2, RRR, no murmurs, no pedal edema Gastrointestinal system: Soft, nondistended, tender to palpation right lower quadrant, normal bowel sounds Central nervous system: Alert and oriented. No focal neurological deficits. Extremities: Symmetric 5 x 5 power. Skin: No rashes, lesions or ulcers Psychiatry: Judgement and insight appear normal. Mood & affect appropriate.     Data Reviewed: I have personally reviewed following  labs and imaging studies  CBC: Recent Labs  Lab 08/19/23 0517 08/19/23 1035 08/19/23 1728 08/19/23 2200 08/20/23 0403  WBC 5.4  --   --   --  4.1  NEUTROABS 2.8  --   --   --   --   HGB 12.4 11.0* 11.0* 10.7* 11.9*  HCT 40.0 35.3* 35.7* 33.5* 38.2  MCV 92.4  --   --   --  92.9  PLT 216  --   --   --  191   Basic Metabolic Panel: Recent Labs  Lab 08/19/23 0517  08/20/23 0403  NA 138 144  K 3.8 3.5  CL 106 108  CO2 27 26  GLUCOSE 93 91  BUN 15 7  CREATININE 0.54 0.60  CALCIUM  9.2 9.0   GFR: Estimated Creatinine Clearance: 67.2 mL/min (by C-G formula based on SCr of 0.6 mg/dL). Liver Function Tests: Recent Labs  Lab 08/19/23 0517  AST 22  ALT 18  ALKPHOS 104  BILITOT 0.6  PROT 6.7  ALBUMIN  3.4*   Recent Labs  Lab 08/19/23 0517  LIPASE 22   No results for input(s): AMMONIA in the last 168 hours. Coagulation Profile: No results for input(s): INR, PROTIME in the last 168 hours. Cardiac Enzymes: No results for input(s): CKTOTAL, CKMB, CKMBINDEX, TROPONINI in the last 168 hours. BNP (last 3 results) No results for input(s): PROBNP in the last 8760 hours. HbA1C: No results for input(s): HGBA1C in the last 72 hours. CBG: No results for input(s): GLUCAP in the last 168 hours. Lipid Profile: No results for input(s): CHOL, HDL, LDLCALC, TRIG, CHOLHDL, LDLDIRECT in the last 72 hours. Thyroid  Function Tests: Recent Labs    08/19/23 0517  TSH 2.123   Anemia Panel: No results for input(s): VITAMINB12, FOLATE, FERRITIN, TIBC, IRON, RETICCTPCT in the last 72 hours. Sepsis Labs: No results for input(s): PROCALCITON, LATICACIDVEN in the last 168 hours.  No results found for this or any previous visit (from the past 240 hours).       Radiology Studies: CT ABDOMEN PELVIS W CONTRAST Result Date: 08/19/2023 CLINICAL DATA:  Right lower quadrant abdominal pain.  Nausea. EXAM: CT ABDOMEN AND PELVIS WITH CONTRAST TECHNIQUE: Multidetector CT imaging of the abdomen and pelvis was performed using the standard protocol following bolus administration of intravenous contrast. RADIATION DOSE REDUCTION: This exam was performed according to the departmental dose-optimization program which includes automated exposure control, adjustment of the mA and/or kV according to patient size and/or use of  iterative reconstruction technique. CONTRAST:  OMNIPAQUE  IOHEXOL  300 MG/ML  SOLN COMPARISON:  04/01/2022 FINDINGS: Lower chest: Dependent atelectasis. Hepatobiliary: No suspicious focal abnormality within the liver parenchyma. Mild intrahepatic biliary duct dilatation. Common bile duct in the head of the pancreas measures up to 10 mm diameter, similar to prior. Cholecystectomy. Pancreas: No focal mass lesion. No dilatation of the main duct. No intraparenchymal cyst. No peripancreatic edema. Spleen: No splenomegaly. No suspicious focal mass lesion. Adrenals/Urinary Tract: No adrenal nodule or mass. Kidneys unremarkable. No evidence for hydroureter. The urinary bladder appears normal for the degree of distention. Stomach/Bowel: Surgical changes are noted in the stomach. Duodenum is normally positioned as is the ligament of Treitz. No small bowel wall thickening. No small bowel dilatation. Small bowel anastomosis noted left abdomen. The terminal ileum is normal. Cecum is identified in the anterior midline abdomen. Colon is diffusely dilated with gas and stool measuring up to 7.3 cm diameter. Sigmoid colon is distended with gas and stool down to the  level of the rectum. No obstructing mass lesion evident. Vascular/Lymphatic: No abdominal aortic aneurysm. No abdominal aortic atherosclerotic calcification. There is no gastrohepatic or hepatoduodenal ligament lymphadenopathy. No retroperitoneal or mesenteric lymphadenopathy. No pelvic sidewall lymphadenopathy. Reproductive: Hysterectomy.  There is no adnexal mass. Other: No intraperitoneal free fluid. Musculoskeletal: No worrisome lytic or sclerotic osseous abnormality. Multiple soft tissue nodules in the sub cutaneous fat of the gluteal regions are likely injection granulomata. IMPRESSION: 1. Diffuse dilatation of the colon with gas and stool measuring up to 7.3 cm diameter. Sigmoid colon is distended with gas and stool down to the level of the rectum. No  obstructing mass lesion evident. Imaging features are compatible with colonic ileus/dysmotility. Functional obstruction not excluded. 2. Mild intrahepatic and extrahepatic biliary duct dilatation. Intrahepatic biliary duct dilatation is progressive in the interval although extrahepatic common bile duct dilatation is similar. Correlation with liver function test recommended. Electronically Signed   By: Camellia Candle M.D.   On: 08/19/2023 06:35        Scheduled Meds:  [MAR Hold] clonazePAM   0.5 mg Oral Daily   [MAR Hold] lamoTRIgine   200 mg Oral Daily   [MAR Hold] levothyroxine   200 mcg Oral QAC breakfast   [MAR Hold] methocarbamol  (ROBAXIN ) injection  1,000 mg Intravenous Once   [MAR Hold] QUEtiapine   600 mg Oral QHS   Continuous Infusions:  sodium chloride  125 mL/hr at 08/20/23 1130   sodium chloride        LOS: 1 day     Calvin KATHEE Robson, MD [facility]   If 7PM-7AM, please contact night-coverage  08/20/2023, 1:36 PM

## 2023-08-20 NOTE — Plan of Care (Signed)
  Problem: Pain Managment: Goal: General experience of comfort will improve and/or be controlled Outcome: Not Progressing

## 2023-08-20 NOTE — Care Plan (Signed)
 Patient s/p procedure MD into talk with patient and review findings Report called to RN on unit Patient alert and oriented  AVSS -  Report given to patient

## 2023-08-20 NOTE — Progress Notes (Signed)
 Rhonda Copping, MD [facility]   [address], [address] [place], [place] [address] Phone: [phone] Fax : [phone]   Subjective: The patient continues to have right side abdominal pain which has a component of musculoskeletal pain in addition to what appears to be intestinal pain. The patient had diffuse colonic distention with sigmoid gas and stool.  The patient took a prep last night and had good results but still has a bit of the prep left to take.   Objective: Vital signs in last 24 hours: Vitals:   08/19/23 1733 08/19/23 1919 08/20/23 0410 08/20/23 0821  BP: 106/72 103/69 103/73 (!) 141/92  Pulse: 75 71 68 90  Resp: 12 16 16 16   Temp: 97.7 F (36.5 C) 98.4 F (36.9 C) 97.6 F (36.4 C) 97.7 F (36.5 C)  TempSrc:  Oral Oral Oral  SpO2: 97% 92% 94% 95%  Weight:      Height:       Weight change:   Intake/Output Summary (Last 24 hours) at 08/20/2023 9161 Last data filed at 08/20/2023 0818 Gross per 24 hour  Intake 2576.83 ml  Output --  Net 2576.83 ml     Exam: Heart:: Regular rate and rhythm or without murmur or extra heart sounds Lungs: normal and clear to auscultation and percussion Abdomen: soft, mild tenderness in the right side of the abdomen, normal bowel sounds   Lab Results: @LABTEST2 @ Micro Results: No results found for this or any previous visit (from the past 240 hours). Studies/Results: CT ABDOMEN PELVIS W CONTRAST Result Date: 08/19/2023 CLINICAL DATA:  Right lower quadrant abdominal pain.  Nausea. EXAM: CT ABDOMEN AND PELVIS WITH CONTRAST TECHNIQUE: Multidetector CT imaging of the abdomen and pelvis was performed using the standard protocol following bolus administration of intravenous contrast. RADIATION DOSE REDUCTION: This exam was performed according to the departmental dose-optimization program which includes automated exposure control, adjustment of the mA and/or kV according to patient size and/or use of iterative reconstruction technique.  CONTRAST:  OMNIPAQUE  IOHEXOL  300 MG/ML  SOLN COMPARISON:  04/01/2022 FINDINGS: Lower chest: Dependent atelectasis. Hepatobiliary: No suspicious focal abnormality within the liver parenchyma. Mild intrahepatic biliary duct dilatation. Common bile duct in the head of the pancreas measures up to 10 mm diameter, similar to prior. Cholecystectomy. Pancreas: No focal mass lesion. No dilatation of the main duct. No intraparenchymal cyst. No peripancreatic edema. Spleen: No splenomegaly. No suspicious focal mass lesion. Adrenals/Urinary Tract: No adrenal nodule or mass. Kidneys unremarkable. No evidence for hydroureter. The urinary bladder appears normal for the degree of distention. Stomach/Bowel: Surgical changes are noted in the stomach. Duodenum is normally positioned as is the ligament of Treitz. No small bowel wall thickening. No small bowel dilatation. Small bowel anastomosis noted left abdomen. The terminal ileum is normal. Cecum is identified in the anterior midline abdomen. Colon is diffusely dilated with gas and stool measuring up to 7.3 cm diameter. Sigmoid colon is distended with gas and stool down to the level of the rectum. No obstructing mass lesion evident. Vascular/Lymphatic: No abdominal aortic aneurysm. No abdominal aortic atherosclerotic calcification. There is no gastrohepatic or hepatoduodenal ligament lymphadenopathy. No retroperitoneal or mesenteric lymphadenopathy. No pelvic sidewall lymphadenopathy. Reproductive: Hysterectomy.  There is no adnexal mass. Other: No intraperitoneal free fluid. Musculoskeletal: No worrisome lytic or sclerotic osseous abnormality. Multiple soft tissue nodules in the sub cutaneous fat of the gluteal regions are likely injection granulomata. IMPRESSION: 1. Diffuse dilatation of the colon with gas and stool measuring up to 7.3 cm  diameter. Sigmoid colon is distended with gas and stool down to the level of the rectum. No obstructing mass lesion evident. Imaging  features are compatible with colonic ileus/dysmotility. Functional obstruction not excluded. 2. Mild intrahepatic and extrahepatic biliary duct dilatation. Intrahepatic biliary duct dilatation is progressive in the interval although extrahepatic common bile duct dilatation is similar. Correlation with liver function test recommended. Electronically Signed   By: Camellia Candle M.D.   On: 08/19/2023 06:35   Medications: I have reviewed the patient's current medications. Scheduled Meds:  clonazePAM   0.5 mg Oral Daily   lamoTRIgine   200 mg Oral Daily   levothyroxine   200 mcg Oral QAC breakfast   QUEtiapine   600 mg Oral QHS   Continuous Infusions:  sodium chloride  125 mL/hr at 08/20/23 0715   sodium chloride      PRN Meds:.HYDROmorphone  (DILAUDID ) injection, ondansetron  (ZOFRAN ) IV, oxyCODONE    Assessment: Principal Problem:   GI bleed Active Problems:   Abdominal pain   Lower GI bleed   Right lower quadrant pain   Rectal bleeding    Plan: The patient took a prep to see if her pain would improve but she continues to have the discomfort.  She has had good results with the prep but is still not clear.  She will continue to take the prep until it is finished and then will have a colonoscopy later today.  The patient has been explained the plan and agrees with it.   LOS: 1 day   Rhonda Copping, MD.FACG 08/20/2023, 8:38 AM Pager [phone] 7am-5pm  Check AMION for 5pm -7am coverage and on weekends

## 2023-08-20 NOTE — Op Note (Signed)
 [facility] Gastroenterology Patient Name: Rhonda Palmer Procedure Date: 08/20/2023 2:08 PM MRN: [id number] Account #: [id number] Date of Birth: 1970-08-14 Admit Type: Inpatient Age: 53 Room: [facility] ENDO ROOM 3 Gender: Female Note Status: Finalized Instrument Name: Arvis [id number] Procedure:             Colonoscopy Indications:           Abdominal pain in the right lower quadrant,                         Hematochezia Providers:             [name] MD, MD Referring MD:          Maxie LABOR. Aycock MD (Referring MD) Medicines:             Propofol  per Anesthesia Complications:         No immediate complications. Procedure:             Pre-Anesthesia Assessment:                        - Prior to the procedure, a History and Physical was                         performed, and patient medications and allergies were                         reviewed. The patient's tolerance of previous                         anesthesia was also reviewed. The risks and benefits                         of the procedure and the sedation options and risks                         were discussed with the patient. All questions were                         answered, and informed consent was obtained. Prior                         Anticoagulants: The patient has taken no anticoagulant                         or antiplatelet agents. ASA Grade Assessment: II - A                         patient with mild systemic disease. After reviewing                         the risks and benefits, the patient was deemed in                         satisfactory condition to undergo the procedure.                        After obtaining informed consent, the colonoscope was  passed under direct vision. Throughout the procedure,                         the patient's blood pressure, pulse, and oxygen                         saturations were monitored continuously. The                          Colonoscope was introduced through the anus and                         advanced to the the terminal ileum. The colonoscopy                         was performed without difficulty. The patient                         tolerated the procedure well. The quality of the bowel                         preparation was excellent. Findings:      The perianal and digital rectal examinations were normal.      The terminal ileum appeared normal.      Non-bleeding internal hemorrhoids were found during retroflexion. The       hemorrhoids were Grade II (internal hemorrhoids that prolapse but reduce       spontaneously).      The exam was otherwise without abnormality. Impression:            - The examined portion of the ileum was normal.                        - Non-bleeding internal hemorrhoids.                        - The examination was otherwise normal.                        - No specimens collected. Recommendation:        - Return patient to hospital ward for ongoing care.                        - Resume regular diet.                        - Continue present medications. Procedure Code(s):     --- Professional ---                        [phone], Colonoscopy, flexible; diagnostic, including                         collection of specimen(s) by brushing or washing, when                         performed (separate procedure) Diagnosis Code(s):     --- Professional ---                        R10.31, Right lower quadrant pain  K92.1, Melena (includes Hematochezia) CPT copyright 2022 American Medical Association. All rights reserved. The codes documented in this report are preliminary and upon coder review may  be revised to meet current compliance requirements. [name] MD, MD 08/20/2023 2:39:19 PM This report has been signed electronically. Number of Addenda: 0 Note Initiated On: 08/20/2023 2:08 PM Scope Withdrawal Time: 0 hours 7 minutes 27 seconds  Total Procedure  Duration: 0 hours 22 minutes 37 seconds  Estimated Blood Loss:  Estimated blood loss: none. Estimated blood loss: none.      [facility]

## 2023-08-20 NOTE — TOC CM/SW Note (Signed)
 Transition of Care [facility]) - Inpatient Brief Assessment   Patient Details  Name: Rhonda Palmer MRN: [id number] Date of Birth: [birth year]  Transition of Care [facility]) CM/SW Contact:    Asberry CHRISTELLA Jaksch, RN Phone Number: 08/20/2023, 11:01 AM   Clinical Narrative:   Transition of Care [facility]) Screening Note   Patient Details  Name: Rhonda Palmer Date of Birth: 1970-06-07   Transition of Care [facility]) CM/SW Contact:    Asberry CHRISTELLA Jaksch, RN Phone Number: 08/20/2023, 11:01 AM    Transition of Care Department [facility]) has reviewed patient and no TOC needs have been identified at this time. If new patient transition needs arise, please place a TOC consult.    Transition of Care Asessment: Insurance and Status: Insurance coverage has been reviewed Patient has primary care physician: Yes     Prior/Current Home Services: Current home services ([facility] via [facility]) Social Drivers of Health Review: SDOH reviewed no interventions necessary Readmission risk has been reviewed: Yes Transition of care needs: no transition of care needs at this time

## 2023-08-20 NOTE — Significant Event (Signed)
       CROSS COVER NOTE  NAME: Rhonda Palmer MRN: [id number] DOB : [birth year] ATTENDING PHYSICIAN: Jhonny Calvin NOVAK, MD    Date of Service   08/20/2023   HPI/Events of Note   Nurse notified me of patient with stated anxiety. Takes klonopin  at home. Outpatient is for TID prn anxiety. Ordered once daily here H&P reviewed, labs reviewed  Interventions   Assessment/Plan: Additional dose of klonopin  ordered for now Day team to assess need to restore home dosing        Erminio LITTIE Cone NP [facility] Cross Cover 7pm-7am - check amion for availability Pager [phone]

## 2023-08-20 NOTE — Plan of Care (Signed)

## 2023-08-20 NOTE — Plan of Care (Signed)
  Problem: Education: Goal: Knowledge of General Education information will improve Description: Including pain rating scale, medication(s)/side effects and non-pharmacologic comfort measures Outcome: Progressing   Problem: Coping: Goal: Level of anxiety will decrease Outcome: Progressing   Problem: Elimination: Goal: Will not experience complications related to bowel motility Outcome: Progressing Goal: Will not experience complications related to urinary retention Outcome: Progressing   Problem: Safety: Goal: Ability to remain free from injury will improve Outcome: Progressing   Problem: Education: Goal: Ability to identify signs and symptoms of gastrointestinal bleeding will improve Outcome: Progressing   Problem: Bowel/Gastric: Goal: Will show no signs and symptoms of gastrointestinal bleeding Outcome: Progressing   Problem: Clinical Measurements: Goal: Complications related to the disease process, condition or treatment will be avoided or minimized Outcome: Progressing

## 2023-08-21 ENCOUNTER — Encounter: Payer: Self-pay | Admitting: Gastroenterology

## 2023-08-21 ENCOUNTER — Inpatient Hospital Stay: Payer: MEDICAID

## 2023-08-21 DIAGNOSIS — K922 Gastrointestinal hemorrhage, unspecified: Secondary | ICD-10-CM | POA: Diagnosis not present

## 2023-08-21 LAB — HEPATIC FUNCTION PANEL
ALT: 23 U/L (ref 0–44)
AST: 30 U/L (ref 15–41)
Albumin: 2.7 g/dL — ABNORMAL LOW (ref 3.5–5.0)
Alkaline Phosphatase: 113 U/L (ref 38–126)
Bilirubin, Direct: <0.1 mg/dL (ref 0.0–0.2)
Total Bilirubin: 0.6 mg/dL (ref 0.0–1.2)
Total Protein: 5.3 g/dL — ABNORMAL LOW (ref 6.5–8.1)

## 2023-08-21 MED ORDER — GADOBUTROL 1 MMOL/ML IV SOLN
6.0000 mL | Freq: Once | INTRAVENOUS | Status: AC | PRN
  Administered 2023-08-21: 6 mL via INTRAVENOUS

## 2023-08-21 MED ORDER — DICYCLOMINE HCL 20 MG PO TABS
20.0000 mg | ORAL_TABLET | Freq: Three times a day (TID) | ORAL | Status: DC
  Administered 2023-08-21 – 2023-08-24 (×12): 20 mg via ORAL
  Filled 2023-08-21 (×14): qty 1

## 2023-08-21 MED ORDER — PROPRANOLOL HCL 10 MG PO TABS
40.0000 mg | ORAL_TABLET | Freq: Two times a day (BID) | ORAL | Status: DC
  Administered 2023-08-21 – 2023-08-27 (×12): 40 mg via ORAL
  Filled 2023-08-21 (×12): qty 4

## 2023-08-21 MED ORDER — IOHEXOL 9 MG/ML PO SOLN
1000.0000 mL | Freq: Once | ORAL | Status: DC | PRN
  Administered 2023-08-21: 1000 mL via ORAL

## 2023-08-21 MED ORDER — METHOCARBAMOL 500 MG PO TABS
500.0000 mg | ORAL_TABLET | Freq: Four times a day (QID) | ORAL | Status: DC
  Administered 2023-08-21 – 2023-08-22 (×5): 500 mg via ORAL
  Filled 2023-08-21 (×5): qty 1

## 2023-08-21 NOTE — Progress Notes (Signed)
    Rhonda Copping, MD [facility]   [address], [address] [place], [place] [address] Phone: [phone] Fax : [phone]   Subjective: The patient reports that she had some nausea overnight but has been starting to eat her breakfast since she states she is so hungry.  She continues to have the abdominal pain which she states is new for her.  She also reports that the pain has not improved at all.  She is status post colonoscopy from yesterday with investigation of the colon and terminal ileum without any abnormality seen.   Objective: Vital signs in last 24 hours: Vitals:   08/20/23 1525 08/20/23 1937 08/21/23 0452 08/21/23 0810  BP: 114/80 (!) 107/58 122/85 117/84  Pulse: 76 81 69 74  Resp: 16 17 16 18   Temp: 98.8 F (37.1 C) 98 F (36.7 C) 97.7 F (36.5 C) 98.1 F (36.7 C)  TempSrc: Oral  Oral   SpO2: 100% 93% 96% 94%  Weight:      Height:       Weight change:   Intake/Output Summary (Last 24 hours) at 08/21/2023 9076 Last data filed at 08/21/2023 9196 Gross per 24 hour  Intake 1626.89 ml  Output 0 ml  Net 1626.89 ml     Exam: Heart:: Regular rate and rhythm or without murmur or extra heart sounds Lungs: normal and clear to auscultation and percussion Abdomen: soft, mild tenderness of the right abdomen, normal bowel sounds   Lab Results: @LABTEST2 @ Micro Results: No results found for this or any previous visit (from the past 240 hours). Studies/Results: No results found. Medications: I have reviewed the patient's current medications. Scheduled Meds:  clonazePAM   0.5 mg Oral Daily   lamoTRIgine   200 mg Oral Daily   levothyroxine   200 mcg Oral QAC breakfast   QUEtiapine   600 mg Oral QHS   Continuous Infusions:  sodium chloride  75 mL/hr at 08/21/23 0803   PRN Meds:.HYDROmorphone  (DILAUDID ) injection, ondansetron  (ZOFRAN ) IV, oxyCODONE    Assessment: Principal Problem:   GI bleed Active Problems:   Abdominal pain   Lower GI bleed   Right lower quadrant  pain   Rectal bleeding   RLQ abdominal pain    Plan: This patient has right-sided abdominal pain without any pathology seen in the colon or small bowel with a normal CT scan.  The patient keeps mentioning that she thinks it is her appendix.  I have told her that the first CT scan did not show any problems with her appendix.  I have asked her to discuss whether or not a repeat CT scan would be advantageous with her hospitalist.  She can be tried on an antispasmodic if the abdominal pain continues such as Flexeril  or an intestinal antispasmodic such as dicyclomine .  I am not planning any further GI intervention at this time. Dr. Onita will be covering this weekend.   LOS: 2 days   Rhonda Copping, MD.FACG 08/21/2023, 9:23 AM Pager [phone] 7am-5pm  Check AMION for 5pm -7am coverage and on weekends

## 2023-08-21 NOTE — Progress Notes (Signed)
 PROGRESS NOTE    Rhonda Palmer  FMW:[id number] DOB: 1970/10/07 DOA: 08/19/2023 PCP: Lorel Maxie LABOR, MD    Brief Narrative:  53 y.o. female with medical history significant of TIA on Plavix , IIDM, hypothyroidism, anxiety/depression, chronic back pain, chronic pain syndrome presented with sudden onset of abdominal pain and lower GI bleed.   Patient was doing fine until yesterday evening, she started to have severe right lower quadrant abdominal pain sharp like constant, with nausea but no vomiting.  She felt  must be my appendix and came to ED.  Overnight, she passed gas and this morning she passed a large bowel movement, with hard stool and coated with bright red blood and left lower quadrant abdominal pain persisted after the bowel movement.  Denied any history of abdominal pain of similar nature, no history of previous colonoscopies.  Patient has been Ozempic for weight loss, managed about 35 pound loss in the last 6 months.  No history of hemorrhoids.  Seen in consultation by GI.  Plan for diagnostic colonoscopy 7/24  Assessment & Plan:   Principal Problem:   GI bleed Active Problems:   Abdominal pain   Lower GI bleed   Right lower quadrant pain   Rectal bleeding   RLQ abdominal pain  Colon ileus Hematochezia Intractable abdominal pain Abdominal pain is not entirely of clear etiology. Could be due to ileus GI consulted Colonoscopy done 7/24 No bleeding noted.  Likely internal hemorrhoids with the source of hematochezia He will with abdominal pain Plan: Repeat CT abdomen pelvis with oral contrast alone.  Add scheduled muscle relaxant.  Dicyclomine .  Avoid narcotics if able  Intra and extrahepatic biliary ductal dilatation Unclear etiology.  No obstructing stones.  No elevation in transaminitis suggest word no elevation in liver enzymes or bilirubin to suggest CBD pathology.  This will need to be followed as outpatient.   IIDM SSI for now   Hx of TIA Hold Plavix   today.  Restart 7/26   HTN Continue propranolol    Hypothyroidism Continue Synthroid    Weight loss Intentional.  Has been on Ozempic with good result.   DVT prophylaxis: On hold Code Status: Full Family Communication: None Disposition Plan: Status is: Inpatient Remains inpatient appropriate because: Intractable abdominal pain.  Colonic ileus.  GI bleed   Level of care: Telemetry Medical  Consultants:  GI  Procedures:  Colonoscopy  Antimicrobials: None   Subjective: Seen and examined.  Continues to endorse severe right-sided abdominal pain  Objective: Vitals:   08/20/23 1525 08/20/23 1937 08/21/23 0452 08/21/23 0810  BP: 114/80 (!) 107/58 122/85 117/84  Pulse: 76 81 69 74  Resp: 16 17 16 18   Temp: 98.8 F (37.1 C) 98 F (36.7 C) 97.7 F (36.5 C) 98.1 F (36.7 C)  TempSrc: Oral  Oral   SpO2: 100% 93% 96% 94%  Weight:      Height:        Intake/Output Summary (Last 24 hours) at 08/21/2023 1405 Last data filed at 08/21/2023 1005 Gross per 24 hour  Intake 2498.76 ml  Output 0 ml  Net 2498.76 ml   Filed Weights   08/19/23 0451 08/20/23 1339  Weight: 65.8 kg 65.8 kg    Examination:  General exam: Mild distress due to pain Respiratory system: Clear to auscultation. Respiratory effort normal. Cardiovascular system: S1-S2, RRR, no murmurs, no pedal edema Gastrointestinal system: Soft, nondistended, tender to palpation right lower quadrant, normal bowel sounds Central nervous system: Alert and oriented. No focal neurological deficits. Extremities: Symmetric  5 x 5 power. Skin: No rashes, lesions or ulcers Psychiatry: Judgement and insight appear normal. Mood & affect appropriate.     Data Reviewed: I have personally reviewed following labs and imaging studies  CBC: Recent Labs  Lab 08/19/23 0517 08/19/23 1035 08/19/23 1728 08/19/23 2200 08/20/23 0403  WBC 5.4  --   --   --  4.1  NEUTROABS 2.8  --   --   --   --   HGB 12.4 11.0* 11.0* 10.7*  11.9*  HCT 40.0 35.3* 35.7* 33.5* 38.2  MCV 92.4  --   --   --  92.9  PLT 216  --   --   --  191   Basic Metabolic Panel: Recent Labs  Lab 08/19/23 0517 08/20/23 0403  NA 138 144  K 3.8 3.5  CL 106 108  CO2 27 26  GLUCOSE 93 91  BUN 15 7  CREATININE 0.54 0.60  CALCIUM  9.2 9.0   GFR: Estimated Creatinine Clearance: 67.2 mL/min (by C-G formula based on SCr of 0.6 mg/dL). Liver Function Tests: Recent Labs  Lab 08/19/23 0517  AST 22  ALT 18  ALKPHOS 104  BILITOT 0.6  PROT 6.7  ALBUMIN  3.4*   Recent Labs  Lab 08/19/23 0517  LIPASE 22   No results for input(s): AMMONIA in the last 168 hours. Coagulation Profile: No results for input(s): INR, PROTIME in the last 168 hours. Cardiac Enzymes: No results for input(s): CKTOTAL, CKMB, CKMBINDEX, TROPONINI in the last 168 hours. BNP (last 3 results) No results for input(s): PROBNP in the last 8760 hours. HbA1C: No results for input(s): HGBA1C in the last 72 hours. CBG: No results for input(s): GLUCAP in the last 168 hours. Lipid Profile: No results for input(s): CHOL, HDL, LDLCALC, TRIG, CHOLHDL, LDLDIRECT in the last 72 hours. Thyroid  Function Tests: Recent Labs    08/19/23 0517  TSH 2.123   Anemia Panel: No results for input(s): VITAMINB12, FOLATE, FERRITIN, TIBC, IRON, RETICCTPCT in the last 72 hours. Sepsis Labs: No results for input(s): PROCALCITON, LATICACIDVEN in the last 168 hours.  No results found for this or any previous visit (from the past 240 hours).       Radiology Studies: No results found.       Scheduled Meds:  clonazePAM   0.5 mg Oral Daily   dicyclomine   20 mg Oral TID AC & HS   lamoTRIgine   200 mg Oral Daily   levothyroxine   200 mcg Oral QAC breakfast   methocarbamol   500 mg Oral QID   propranolol   40 mg Oral BID   QUEtiapine   600 mg Oral QHS   Continuous Infusions:  sodium chloride  75 mL/hr at 08/21/23 1005     LOS: 2 days      Calvin KATHEE Robson, MD [facility]   If 7PM-7AM, please contact night-coverage  08/21/2023, 2:05 PM

## 2023-08-21 NOTE — Plan of Care (Signed)

## 2023-08-21 NOTE — Plan of Care (Signed)
   Problem: Education: Goal: Knowledge of General Education information will improve Description: Including pain rating scale, medication(s)/side effects and non-pharmacologic comfort measures Outcome: Progressing   Problem: Activity: Goal: Risk for activity intolerance will decrease Outcome: Progressing   Problem: Nutrition: Goal: Adequate nutrition will be maintained Outcome: Progressing

## 2023-08-22 ENCOUNTER — Inpatient Hospital Stay: Payer: MEDICAID

## 2023-08-22 DIAGNOSIS — K922 Gastrointestinal hemorrhage, unspecified: Secondary | ICD-10-CM | POA: Diagnosis not present

## 2023-08-22 LAB — CBC WITH DIFFERENTIAL/PLATELET
Abs Immature Granulocytes: 0.01 K/uL (ref 0.00–0.07)
Basophils Absolute: 0 K/uL (ref 0.0–0.1)
Basophils Relative: 1 %
Eosinophils Absolute: 0.4 K/uL (ref 0.0–0.5)
Eosinophils Relative: 8 %
HCT: 36.2 % (ref 36.0–46.0)
Hemoglobin: 11.4 g/dL — ABNORMAL LOW (ref 12.0–15.0)
Immature Granulocytes: 0 %
Lymphocytes Relative: 42 %
Lymphs Abs: 2.4 K/uL (ref 0.7–4.0)
MCH: 29 pg (ref 26.0–34.0)
MCHC: 31.5 g/dL (ref 30.0–36.0)
MCV: 92.1 fL (ref 80.0–100.0)
Monocytes Absolute: 0.4 K/uL (ref 0.1–1.0)
Monocytes Relative: 8 %
Neutro Abs: 2.3 K/uL (ref 1.7–7.7)
Neutrophils Relative %: 41 %
Platelets: 203 K/uL (ref 150–400)
RBC: 3.93 MIL/uL (ref 3.87–5.11)
RDW: 13.5 % (ref 11.5–15.5)
WBC: 5.5 K/uL (ref 4.0–10.5)
nRBC: 0 % (ref 0.0–0.2)

## 2023-08-22 LAB — BASIC METABOLIC PANEL WITH GFR
Anion gap: 6 (ref 5–15)
BUN: 6 mg/dL (ref 6–20)
CO2: 26 mmol/L (ref 22–32)
Calcium: 8.8 mg/dL — ABNORMAL LOW (ref 8.9–10.3)
Chloride: 108 mmol/L (ref 98–111)
Creatinine, Ser: 0.5 mg/dL (ref 0.44–1.00)
GFR, Estimated: >60 mL/min (ref >60)
Glucose, Bld: 103 mg/dL — ABNORMAL HIGH (ref 70–99)
Potassium: 3.5 mmol/L (ref 3.5–5.1)
Sodium: 140 mmol/L (ref 135–145)

## 2023-08-22 LAB — URINALYSIS, COMPLETE (UACMP) WITH MICROSCOPIC
Bilirubin Urine: NEGATIVE
Glucose, UA: NEGATIVE mg/dL
Hgb urine dipstick: NEGATIVE
Ketones, ur: NEGATIVE mg/dL
Leukocytes,Ua: NEGATIVE
Nitrite: NEGATIVE
Protein, ur: NEGATIVE mg/dL
Specific Gravity, Urine: 1.012 (ref 1.005–1.030)
pH: 6 (ref 5.0–8.0)

## 2023-08-22 MED ORDER — OXYCODONE HCL 5 MG PO TABS
5.0000 mg | ORAL_TABLET | ORAL | Status: DC | PRN
  Administered 2023-08-22 – 2023-08-24 (×9): 10 mg via ORAL
  Administered 2023-08-24: 5 mg via ORAL
  Administered 2023-08-24 – 2023-08-25 (×5): 10 mg via ORAL
  Filled 2023-08-22 (×16): qty 2

## 2023-08-22 MED ORDER — ENOXAPARIN SODIUM 40 MG/0.4ML IJ SOSY
40.0000 mg | PREFILLED_SYRINGE | INTRAMUSCULAR | Status: DC
  Administered 2023-08-22 – 2023-08-26 (×5): 40 mg via SUBCUTANEOUS
  Filled 2023-08-22 (×5): qty 0.4

## 2023-08-22 MED ORDER — CLONAZEPAM 0.5 MG PO TABS
0.5000 mg | ORAL_TABLET | Freq: Two times a day (BID) | ORAL | Status: DC
Start: 2023-08-22 — End: 2023-08-27
  Administered 2023-08-22 – 2023-08-27 (×10): 0.5 mg via ORAL
  Filled 2023-08-22 (×10): qty 1

## 2023-08-22 MED ORDER — SIMETHICONE 80 MG PO CHEW
80.0000 mg | CHEWABLE_TABLET | Freq: Four times a day (QID) | ORAL | Status: DC
  Administered 2023-08-22 – 2023-08-24 (×9): 80 mg via ORAL
  Filled 2023-08-22 (×9): qty 1

## 2023-08-22 MED ORDER — POLYETHYLENE GLYCOL 3350 17 G PO PACK
17.0000 g | PACK | Freq: Every day | ORAL | Status: DC
  Administered 2023-08-22 – 2023-08-27 (×6): 17 g via ORAL
  Filled 2023-08-22 (×6): qty 1

## 2023-08-22 MED ORDER — SENNOSIDES-DOCUSATE SODIUM 8.6-50 MG PO TABS
1.0000 | ORAL_TABLET | Freq: Two times a day (BID) | ORAL | Status: DC
  Administered 2023-08-22 – 2023-08-27 (×10): 1 via ORAL
  Filled 2023-08-22 (×10): qty 1

## 2023-08-22 MED ORDER — METHOCARBAMOL 500 MG PO TABS
750.0000 mg | ORAL_TABLET | Freq: Four times a day (QID) | ORAL | Status: DC
  Administered 2023-08-22 – 2023-08-25 (×13): 750 mg via ORAL
  Filled 2023-08-22 (×14): qty 2

## 2023-08-22 MED ORDER — CLOPIDOGREL BISULFATE 75 MG PO TABS
75.0000 mg | ORAL_TABLET | Freq: Every day | ORAL | Status: DC
  Administered 2023-08-22 – 2023-08-27 (×6): 75 mg via ORAL
  Filled 2023-08-22 (×6): qty 1

## 2023-08-22 MED ORDER — HYDROMORPHONE HCL 1 MG/ML IJ SOLN
0.5000 mg | Freq: Four times a day (QID) | INTRAMUSCULAR | Status: DC | PRN
  Administered 2023-08-22 – 2023-08-23 (×3): 0.5 mg via INTRAVENOUS
  Filled 2023-08-22 (×3): qty 0.5

## 2023-08-22 NOTE — Progress Notes (Signed)
 Inpatient Follow-up/Progress Note   Patient ID: Rhonda Palmer is a 53 y.o. female.  Overnight Events / Subjective Findings NAEON. Pt continues to c/o RLQ pain. Tolerating some PO better with anti-emetic prior to meal. CT and MRI MRCP performed yesterday given initially reported CBD Dilation increasing. LFTs wnl. No signs of biliary obstruction. Bowels overall normal aside from increased stool/gas pattern likely relating to recent colonoscopy.  Underwent pelvis US  today. She does report h/o ovarian cysts. Afebrile. No further blood with stool No other acute gi complaints.  Review of Systems  Constitutional:  Positive for appetite change. Negative for activity change, chills, diaphoresis, fatigue and fever.  HENT:  Negative for trouble swallowing and voice change.   Respiratory:  Negative for shortness of breath and wheezing.   Cardiovascular:  Negative for chest pain, palpitations and leg swelling.  Gastrointestinal:  Positive for abdominal pain, anal bleeding and nausea. Negative for abdominal distention, blood in stool, constipation, diarrhea, rectal pain and vomiting.  Musculoskeletal:  Negative for arthralgias and myalgias.  Skin:  Negative for color change and pallor.  Neurological:  Negative for dizziness, syncope and weakness.  Psychiatric/Behavioral:  Negative for confusion.   All other systems reviewed and are negative.    Medications  Current Facility-Administered Medications:    0.9 %  sodium chloride  infusion, , Intravenous, Continuous, Sreenath, Sudheer B, MD, Last Rate: 75 mL/hr at 08/21/23 8177, New Bag at 08/21/23 1822   clonazePAM  (KLONOPIN ) tablet 0.5 mg, 0.5 mg, Oral, Daily, Jinny, Darren, MD, 0.5 mg at 08/21/23 2010   dicyclomine  (BENTYL ) tablet 20 mg, 20 mg, Oral, TID AC & HS, Sreenath, Sudheer B, MD, 20 mg at 08/21/23 2014   HYDROmorphone  (DILAUDID ) injection 0.5 mg, 0.5 mg, Intravenous, Q4H PRN, Jinny Carmine, MD, 0.5 mg at 08/22/23 0327   iohexol   (OMNIPAQUE ) 9 MG/ML oral solution 1,000 mL, 1,000 mL, Oral, Once PRN, Sreenath, Sudheer B, MD, 1,000 mL at 08/21/23 1141   lamoTRIgine  (LAMICTAL ) tablet 200 mg, 200 mg, Oral, Daily, Jinny, Darren, MD, 200 mg at 08/21/23 0802   levothyroxine  (SYNTHROID ) tablet 200 mcg, 200 mcg, Oral, QAC breakfast, Wohl, Darren, MD, 200 mcg at 08/22/23 0510   methocarbamol  (ROBAXIN ) tablet 500 mg, 500 mg, Oral, QID, Sreenath, Sudheer B, MD, 500 mg at 08/21/23 2010   ondansetron  (ZOFRAN ) injection 4 mg, 4 mg, Intravenous, Q6H PRN, Jinny Carmine, MD, 4 mg at 08/21/23 2021   oxyCODONE  (Oxy IR/ROXICODONE ) immediate release tablet 5 mg, 5 mg, Oral, Q6H PRN, Jinny Carmine, MD, 5 mg at 08/22/23 0617   propranolol  (INDERAL ) tablet 40 mg, 40 mg, Oral, BID, Sreenath, Sudheer B, MD, 40 mg at 08/21/23 2011   QUEtiapine  (SEROQUEL ) tablet 600 mg, 600 mg, Oral, QHS, Wohl, Darren, MD, 600 mg at 08/21/23 2011  sodium chloride  75 mL/hr at 08/21/23 1822    HYDROmorphone  (DILAUDID ) injection, iohexol , ondansetron  (ZOFRAN ) IV, oxyCODONE    Objective    Vitals:   08/21/23 0810 08/21/23 2007 08/22/23 0418 08/22/23 0741  BP: 117/84 117/80 109/75 116/77  Pulse: 74 75 62 60  Resp: 18 17 18 16   Temp: 98.1 F (36.7 C) 98.6 F (37 C) 98 F (36.7 C) 97.8 F (36.6 C)  TempSrc:      SpO2: 94% 98% 96% 97%  Weight:      Height:         Physical Exam Vitals and nursing note reviewed.  Constitutional:      General: She is not in acute distress.    Appearance: She  is not ill-appearing, toxic-appearing or diaphoretic.  HENT:     Head: Normocephalic and atraumatic.     Nose: Nose normal.     Mouth/Throat:     Mouth: Mucous membranes are moist.     Pharynx: Oropharynx is clear.  Eyes:     General: No scleral icterus.    Extraocular Movements: Extraocular movements intact.  Cardiovascular:     Rate and Rhythm: Normal rate and regular rhythm.     Heart sounds: Normal heart sounds. No murmur heard.    No friction rub. No gallop.   Pulmonary:     Effort: Pulmonary effort is normal. No respiratory distress.     Breath sounds: Normal breath sounds. No wheezing, rhonchi or rales.  Abdominal:     General: Bowel sounds are normal. There is no distension.     Palpations: Abdomen is soft.     Tenderness: There is abdominal tenderness (RLQ; non peritoneal. no rigidity. Tenderness not exacerbated by deeper palpation). There is no guarding or rebound.  Musculoskeletal:     Cervical back: Neck supple.     Right lower leg: No edema.     Left lower leg: No edema.  Skin:    General: Skin is warm and dry.     Coloration: Skin is not jaundiced or pale.  Neurological:     General: No focal deficit present.     Mental Status: She is alert and oriented to person, place, and time. Mental status is at baseline.  Psychiatric:        Mood and Affect: Mood normal.        Behavior: Behavior normal.        Thought Content: Thought content normal.        Judgment: Judgment normal.      Laboratory Data Recent Labs  Lab 08/19/23 0517 08/19/23 1035 08/19/23 1728 08/19/23 2200 08/20/23 0403  WBC 5.4  --   --   --  4.1  HGB 12.4   < > 11.0* 10.7* 11.9*  HCT 40.0   < > 35.7* 33.5* 38.2  PLT 216  --   --   --  191  NEUTOPHILPCT 52  --   --   --   --   LYMPHOPCT 34  --   --   --   --   MONOPCT 8  --   --   --   --   EOSPCT 5  --   --   --   --    < > = values in this interval not displayed.   Recent Labs  Lab 08/19/23 0517 08/20/23 0403 08/21/23 1619  NA 138 144  --   K 3.8 3.5  --   CL 106 108  --   CO2 27 26  --   BUN 15 7  --   CREATININE 0.54 0.60  --   CALCIUM  9.2 9.0  --   PROT 6.7  --  5.3*  BILITOT 0.6  --  0.6  ALKPHOS 104  --  113  ALT 18  --  23  AST 22  --  30  GLUCOSE 93 91  --    No results for input(s): INR in the last 168 hours.    Imaging Studies: MR ABDOMEN MRCP W WO CONTAST Result Date: 08/21/2023 CLINICAL DATA:  Right lower quadrant abdominal pain. Biliary dilatation. EXAM: MRI ABDOMEN  WITHOUT AND WITH CONTRAST (INCLUDING MRCP) TECHNIQUE: Multiplanar multisequence MR imaging of the abdomen was performed  both before and after the administration of intravenous contrast. Heavily T2-weighted images of the biliary and pancreatic ducts were obtained, and three-dimensional MRCP images were rendered by post processing. CONTRAST:  6mL GADAVIST  GADOBUTROL  1 MMOL/ML IV SOLN COMPARISON:  CT scan 08/21/2023 FINDINGS: Lower chest: Trace right pleural effusion with dependent atelectasis in both lower lobes. Hepatobiliary: Cholecystectomy. Dilated cystic duct remnant and mild prominence of intrahepatic biliary and extrahepatic bile ducts, with common hepatic duct measuring 1.7 cm in diameter; proximal common bile duct measuring 1.6 cm in diameter; mid common bile duct measuring 1.1 cm in diameter; and distal common bile duct 1.0 cm in diameter. Very minimally blunted distal margin of the CBD without filling defect observed. Or differential enhancement at the ampulla. Comparing back to December 2023, the common bile duct diameters are comparable, and this is felt to represent a chronic condition potentially at least partially due to physiologic dilatation related to cholecystectomy No significant focal parenchymal hepatic lesion is observed. Pancreas:  Upper normal sized dorsal pancreatic duct. Spleen: 6 mm cyst inferiorly in the spleen, highly likely to be benign and incidental. Adrenals/Urinary Tract: Right hydronephrosis with dilated renal pelvis and mild proximal right hydroureter. Borderline prominent left renal pelvis without left hydroureter. Adrenal glands unremarkable. On image 12 series 16 there is a suggestion of a 5 mm filling defect in the right lower renal pelvis, but no stone is visible in this vicinity on today or prior exams and there is no enhancement in this vicinity, an/suspect that this is a flow artifact. Stomach/Bowel: Gastric bypass. Substantial prominence of gas and stool in the colon,  possibly physiologic dilatation. Dilated gas-filled sigmoid colon, today's MRI is of the upper abdomen and does not assess the pelvis for entities such as sigmoid volvulus. Vascular/Lymphatic:  Unremarkable Other:  Mild ascites.  Mild mesenteric and subcutaneous edema. Musculoskeletal: Degenerative disc disease and loss of disc height at the L5-S1 level. IMPRESSION: 1. Right hydronephrosis with dilated renal pelvis and mild proximal right hydroureter. Borderline prominent left renal pelvis without left hydroureter. 2. Cholecystectomy. Chronic biliary prominence not changed from 2023, at least partially due to a physiologic effective cholecystectomy. No filling defect in the biliary tree or obstructing lesion in the vicinity of the ampulla identified. 3. Trace right pleural effusion with dependent atelectasis in both lower lobes. 4. Mild ascites. Mild mesenteric and subcutaneous edema. 5. Substantial prominence of gas and stool in the colon, possibly physiologic dilatation. 6. Degenerative disc disease and loss of disc height at the L5-S1 level. Electronically Signed   By: Ryan Salvage M.D.   On: 08/21/2023 20:22   MR 3D Recon At Scanner Result Date: 08/21/2023 CLINICAL DATA:  Right lower quadrant abdominal pain. Biliary dilatation. EXAM: MRI ABDOMEN WITHOUT AND WITH CONTRAST (INCLUDING MRCP) TECHNIQUE: Multiplanar multisequence MR imaging of the abdomen was performed both before and after the administration of intravenous contrast. Heavily T2-weighted images of the biliary and pancreatic ducts were obtained, and three-dimensional MRCP images were rendered by post processing. CONTRAST:  6mL GADAVIST  GADOBUTROL  1 MMOL/ML IV SOLN COMPARISON:  CT scan 08/21/2023 FINDINGS: Lower chest: Trace right pleural effusion with dependent atelectasis in both lower lobes. Hepatobiliary: Cholecystectomy. Dilated cystic duct remnant and mild prominence of intrahepatic biliary and extrahepatic bile ducts, with common hepatic  duct measuring 1.7 cm in diameter; proximal common bile duct measuring 1.6 cm in diameter; mid common bile duct measuring 1.1 cm in diameter; and distal common bile duct 1.0 cm in diameter. Very minimally blunted distal margin of the  CBD without filling defect observed. Or differential enhancement at the ampulla. Comparing back to December 2023, the common bile duct diameters are comparable, and this is felt to represent a chronic condition potentially at least partially due to physiologic dilatation related to cholecystectomy No significant focal parenchymal hepatic lesion is observed. Pancreas:  Upper normal sized dorsal pancreatic duct. Spleen: 6 mm cyst inferiorly in the spleen, highly likely to be benign and incidental. Adrenals/Urinary Tract: Right hydronephrosis with dilated renal pelvis and mild proximal right hydroureter. Borderline prominent left renal pelvis without left hydroureter. Adrenal glands unremarkable. On image 12 series 16 there is a suggestion of a 5 mm filling defect in the right lower renal pelvis, but no stone is visible in this vicinity on today or prior exams and there is no enhancement in this vicinity, an/suspect that this is a flow artifact. Stomach/Bowel: Gastric bypass. Substantial prominence of gas and stool in the colon, possibly physiologic dilatation. Dilated gas-filled sigmoid colon, today's MRI is of the upper abdomen and does not assess the pelvis for entities such as sigmoid volvulus. Vascular/Lymphatic:  Unremarkable Other:  Mild ascites.  Mild mesenteric and subcutaneous edema. Musculoskeletal: Degenerative disc disease and loss of disc height at the L5-S1 level. IMPRESSION: 1. Right hydronephrosis with dilated renal pelvis and mild proximal right hydroureter. Borderline prominent left renal pelvis without left hydroureter. 2. Cholecystectomy. Chronic biliary prominence not changed from 2023, at least partially due to a physiologic effective cholecystectomy. No filling  defect in the biliary tree or obstructing lesion in the vicinity of the ampulla identified. 3. Trace right pleural effusion with dependent atelectasis in both lower lobes. 4. Mild ascites. Mild mesenteric and subcutaneous edema. 5. Substantial prominence of gas and stool in the colon, possibly physiologic dilatation. 6. Degenerative disc disease and loss of disc height at the L5-S1 level. Electronically Signed   By: Ryan Salvage M.D.   On: 08/21/2023 20:22   CT ABDOMEN PELVIS WO CONTRAST Result Date: 08/21/2023 CLINICAL DATA:  Right lower quadrant abdominal pain. EXAM: CT ABDOMEN AND PELVIS WITHOUT CONTRAST TECHNIQUE: Multidetector CT imaging of the abdomen and pelvis was performed following the standard protocol without IV contrast. RADIATION DOSE REDUCTION: This exam was performed according to the departmental dose-optimization program which includes automated exposure control, adjustment of the mA and/or kV according to patient size and/or use of iterative reconstruction technique. COMPARISON:  CT abdomen pelvis dated 08/19/2023. FINDINGS: Evaluation of this exam is limited in the absence of intravenous contrast. Lower chest: Small bilateral pleural effusions. No intra-abdominal free air.  Small free fluid in the pelvis. Hepatobiliary: The liver is unremarkable. Cholecystectomy. Dilated common bile duct measuring approximately 2 cm in diameter. No retained calcified stone noted in the central CBD Pancreas: The pancreas is unremarkable as visualized Spleen: Normal in size without focal abnormality. Adrenals/Urinary Tract: The adrenal glands unremarkable. Bilateral extrarenal pelvis with mild pelviectasis increased since the prior CT. No renal calculi. The visualized ureters and urinary bladder appear unremarkable. Stomach/Bowel: There is postsurgical changes of the bowel. There is no bowel obstruction. The appendix is normal. Vascular/Lymphatic: The abdominal aorta and IVC are unremarkable. No portal  venous gas. There is no adenopathy. Reproductive: Hysterectomy.  No suspicious adnexal masses. Other: Midline anterior pelvic wall incisional scar. Musculoskeletal: No acute osseous pathology. IMPRESSION: 1. No acute intra-abdominal or pelvic pathology. No bowel obstruction. Normal appendix. 2. Small bilateral pleural effusions. Electronically Signed   By: Vanetta Chou M.D.   On: 08/21/2023 14:58    Assessment:   #  RLQ Pain - GI w/u negative; less likely GI in origin at this juncture - reported pain out of proportion to exam and objective GI imaging/lab findings at this time - no signs of ischemia. - CSY without any significant findings  # BRBPR - likely hemorrhoidal - csy on 7/24  # ileus - avoidance of bowel slowing agents such as opioids  # Chronic pain syndrome # BPD 1 # h/o prescription drug dependence including benzodiazepines and opioids # DM2 # Depression/Anxiety  Plan:   Patient has undergone significant GI workup in the hospital including colonoscopy MRI MRCP and CT without acute findings to explain her right lower quadrant discomfort -MRCP with chronic findings of biliary dilation. No signs of obstruction. LFTs within normal limits Gastrointestinal origin of her pain seems to be ruled out at this juncture  Agree with primary team starting MiraLAX  docusate and Bentyl  given imaging findings and irritable bowel features  Pelvic ultrasound performed today  Tolerating p.o. with preemptive antiemetics  No further GI evaluation planned at this time  GI to sign off. Available as needed. Please do not hesitate to call regarding questions or concerns.   Management of other medical comorbidities as per primary team  I personally performed the service.  Thank you for allowing us  to participate in this patient's care. Please don't hesitate to call if any questions or concerns arise.   Elspeth Ozell Jungling, DO [facility]  Portions of the record  may have been created with voice recognition software. Occasional wrong-word or 'sound-a-like' substitutions may have occurred due to the inherent limitations of voice recognition software.  Read the chart carefully and recognize, using context, where substitutions may have occurred.

## 2023-08-22 NOTE — Plan of Care (Signed)

## 2023-08-22 NOTE — Progress Notes (Signed)
 PROGRESS NOTE    Rhonda Palmer  FMW:[id number] DOB: 1970-05-28 DOA: 08/19/2023 PCP: Lorel Maxie LABOR, MD    Brief Narrative:  53 y.o. female with medical history significant of TIA on Plavix , IIDM, hypothyroidism, anxiety/depression, chronic back pain, chronic pain syndrome presented with sudden onset of abdominal pain and lower GI bleed.   Patient was doing fine until yesterday evening, she started to have severe right lower quadrant abdominal pain sharp like constant, with nausea but no vomiting.  She felt  must be my appendix and came to ED.  Overnight, she passed gas and this morning she passed a large bowel movement, with hard stool and coated with bright red blood and left lower quadrant abdominal pain persisted after the bowel movement.  Denied any history of abdominal pain of similar nature, no history of previous colonoscopies.  Patient has been Ozempic for weight loss, managed about 35 pound loss in the last 6 months.  No history of hemorrhoids.  Seen in consultation by GI.  Plan for diagnostic colonoscopy 7/24  Assessment & Plan:   Principal Problem:   GI bleed Active Problems:   Abdominal pain   Lower GI bleed   Right lower quadrant pain   Rectal bleeding   RLQ abdominal pain  Colon ileus Hematochezia Intractable abdominal pain Abdominal pain is not entirely of clear etiology. Could be due to ileus GI consulted Colonoscopy done 7/24 No bleeding noted.  Likely internal hemorrhoids with the source of hematochezia CT Noncon and MRCP performed.  Both unrevealing for GI source of illness.  No acute biliary ductal dilatation to suggest obstructive stone.  No evidence of nephrolithiasis. Plan: Total pelvic ultrasound with transvaginal Doppler ordered to rule out ovarian pathology.  Continue to wean intravenous narcotic.  Intra and extrahepatic biliary ductal dilatation Unclear etiology.  No obstructing stones.  No elevation in transaminitis suggest word no elevation in  liver enzymes or bilirubin to suggest CBD pathology.  MRCP unrevealing for acute pathology.  Will need to be followed as outpatient   IIDM SSI for now   Hx of TIA Resume Plavix  7/26   HTN Continue propranolol    Hypothyroidism Continue Synthroid    Weight loss Intentional.  Has been on Ozempic with good result.   DVT prophylaxis: SQ Lovenox  Code Status: Full Family Communication: None Disposition Plan: Status is: Inpatient Remains inpatient appropriate because: Intractable abdominal pain.  Unclear etiology   Level of care: Telemetry Medical  Consultants:  GI  Procedures:  Colonoscopy  Antimicrobials: None   Subjective: And examined.  Appears more comfortable this morning.  Continues to endorse right lower quadrant abdominal pain.  Objective: Vitals:   08/22/23 0418 08/22/23 0741 08/22/23 0823 08/22/23 1200  BP: 109/75 116/77  (!) 128/93  Pulse: 62 60 65 68  Resp: 18 16    Temp: 98 F (36.7 C) 97.8 F (36.6 C)    TempSrc:      SpO2: 96% 97%    Weight:      Height:        Intake/Output Summary (Last 24 hours) at 08/22/2023 1258 Last data filed at 08/22/2023 1119 Gross per 24 hour  Intake 1460.74 ml  Output --  Net 1460.74 ml   Filed Weights   08/19/23 0451 08/20/23 1339  Weight: 65.8 kg 65.8 kg    Examination:  General exam: Mild distress due to pain Respiratory system: Clear to auscultation. Respiratory effort normal. Cardiovascular system: S1-S2, RRR, no murmurs, no pedal edema Gastrointestinal system: Soft, nondistended, mild TTP  right lower quadrant, normal bowel sounds Central nervous system: Alert and oriented. No focal neurological deficits. Extremities: Symmetric 5 x 5 power. Skin: No rashes, lesions or ulcers Psychiatry: Judgement and insight appear normal. Mood & affect appropriate.     Data Reviewed: I have personally reviewed following labs and imaging studies  CBC: Recent Labs  Lab 08/19/23 0517 08/19/23 1035 08/19/23 1728  08/19/23 2200 08/20/23 0403 08/22/23 1011  WBC 5.4  --   --   --  4.1 5.5  NEUTROABS 2.8  --   --   --   --  2.3  HGB 12.4 11.0* 11.0* 10.7* 11.9* 11.4*  HCT 40.0 35.3* 35.7* 33.5* 38.2 36.2  MCV 92.4  --   --   --  92.9 92.1  PLT 216  --   --   --  191 203   Basic Metabolic Panel: Recent Labs  Lab 08/19/23 0517 08/20/23 0403 08/22/23 1011  NA 138 144 140  K 3.8 3.5 3.5  CL 106 108 108  CO2 27 26 26   GLUCOSE 93 91 103*  BUN 15 7 6   CREATININE 0.54 0.60 0.50  CALCIUM  9.2 9.0 8.8*   GFR: Estimated Creatinine Clearance: 67.2 mL/min (by C-G formula based on SCr of 0.5 mg/dL). Liver Function Tests: Recent Labs  Lab 08/19/23 0517 08/21/23 1619  AST 22 30  ALT 18 23  ALKPHOS 104 113  BILITOT 0.6 0.6  PROT 6.7 5.3*  ALBUMIN  3.4* 2.7*   Recent Labs  Lab 08/19/23 0517  LIPASE 22   No results for input(s): AMMONIA in the last 168 hours. Coagulation Profile: No results for input(s): INR, PROTIME in the last 168 hours. Cardiac Enzymes: No results for input(s): CKTOTAL, CKMB, CKMBINDEX, TROPONINI in the last 168 hours. BNP (last 3 results) No results for input(s): PROBNP in the last 8760 hours. HbA1C: No results for input(s): HGBA1C in the last 72 hours. CBG: No results for input(s): GLUCAP in the last 168 hours. Lipid Profile: No results for input(s): CHOL, HDL, LDLCALC, TRIG, CHOLHDL, LDLDIRECT in the last 72 hours. Thyroid  Function Tests: No results for input(s): TSH, T4TOTAL, FREET4, T3FREE, THYROIDAB in the last 72 hours.  Anemia Panel: No results for input(s): VITAMINB12, FOLATE, FERRITIN, TIBC, IRON, RETICCTPCT in the last 72 hours. Sepsis Labs: No results for input(s): PROCALCITON, LATICACIDVEN in the last 168 hours.  No results found for this or any previous visit (from the past 240 hours).       Radiology Studies: MR ABDOMEN MRCP W WO CONTAST Result Date: 08/21/2023 CLINICAL DATA:  Right  lower quadrant abdominal pain. Biliary dilatation. EXAM: MRI ABDOMEN WITHOUT AND WITH CONTRAST (INCLUDING MRCP) TECHNIQUE: Multiplanar multisequence MR imaging of the abdomen was performed both before and after the administration of intravenous contrast. Heavily T2-weighted images of the biliary and pancreatic ducts were obtained, and three-dimensional MRCP images were rendered by post processing. CONTRAST:  6mL GADAVIST  GADOBUTROL  1 MMOL/ML IV SOLN COMPARISON:  CT scan 08/21/2023 FINDINGS: Lower chest: Trace right pleural effusion with dependent atelectasis in both lower lobes. Hepatobiliary: Cholecystectomy. Dilated cystic duct remnant and mild prominence of intrahepatic biliary and extrahepatic bile ducts, with common hepatic duct measuring 1.7 cm in diameter; proximal common bile duct measuring 1.6 cm in diameter; mid common bile duct measuring 1.1 cm in diameter; and distal common bile duct 1.0 cm in diameter. Very minimally blunted distal margin of the CBD without filling defect observed. Or differential enhancement at the ampulla. Comparing back to December 2023, the  common bile duct diameters are comparable, and this is felt to represent a chronic condition potentially at least partially due to physiologic dilatation related to cholecystectomy No significant focal parenchymal hepatic lesion is observed. Pancreas:  Upper normal sized dorsal pancreatic duct. Spleen: 6 mm cyst inferiorly in the spleen, highly likely to be benign and incidental. Adrenals/Urinary Tract: Right hydronephrosis with dilated renal pelvis and mild proximal right hydroureter. Borderline prominent left renal pelvis without left hydroureter. Adrenal glands unremarkable. On image 12 series 16 there is a suggestion of a 5 mm filling defect in the right lower renal pelvis, but no stone is visible in this vicinity on today or prior exams and there is no enhancement in this vicinity, an/suspect that this is a flow artifact. Stomach/Bowel:  Gastric bypass. Substantial prominence of gas and stool in the colon, possibly physiologic dilatation. Dilated gas-filled sigmoid colon, today's MRI is of the upper abdomen and does not assess the pelvis for entities such as sigmoid volvulus. Vascular/Lymphatic:  Unremarkable Other:  Mild ascites.  Mild mesenteric and subcutaneous edema. Musculoskeletal: Degenerative disc disease and loss of disc height at the L5-S1 level. IMPRESSION: 1. Right hydronephrosis with dilated renal pelvis and mild proximal right hydroureter. Borderline prominent left renal pelvis without left hydroureter. 2. Cholecystectomy. Chronic biliary prominence not changed from 2023, at least partially due to a physiologic effective cholecystectomy. No filling defect in the biliary tree or obstructing lesion in the vicinity of the ampulla identified. 3. Trace right pleural effusion with dependent atelectasis in both lower lobes. 4. Mild ascites. Mild mesenteric and subcutaneous edema. 5. Substantial prominence of gas and stool in the colon, possibly physiologic dilatation. 6. Degenerative disc disease and loss of disc height at the L5-S1 level. Electronically Signed   By: Ryan Salvage M.D.   On: 08/21/2023 20:22   MR 3D Recon At Scanner Result Date: 08/21/2023 CLINICAL DATA:  Right lower quadrant abdominal pain. Biliary dilatation. EXAM: MRI ABDOMEN WITHOUT AND WITH CONTRAST (INCLUDING MRCP) TECHNIQUE: Multiplanar multisequence MR imaging of the abdomen was performed both before and after the administration of intravenous contrast. Heavily T2-weighted images of the biliary and pancreatic ducts were obtained, and three-dimensional MRCP images were rendered by post processing. CONTRAST:  6mL GADAVIST  GADOBUTROL  1 MMOL/ML IV SOLN COMPARISON:  CT scan 08/21/2023 FINDINGS: Lower chest: Trace right pleural effusion with dependent atelectasis in both lower lobes. Hepatobiliary: Cholecystectomy. Dilated cystic duct remnant and mild prominence of  intrahepatic biliary and extrahepatic bile ducts, with common hepatic duct measuring 1.7 cm in diameter; proximal common bile duct measuring 1.6 cm in diameter; mid common bile duct measuring 1.1 cm in diameter; and distal common bile duct 1.0 cm in diameter. Very minimally blunted distal margin of the CBD without filling defect observed. Or differential enhancement at the ampulla. Comparing back to December 2023, the common bile duct diameters are comparable, and this is felt to represent a chronic condition potentially at least partially due to physiologic dilatation related to cholecystectomy No significant focal parenchymal hepatic lesion is observed. Pancreas:  Upper normal sized dorsal pancreatic duct. Spleen: 6 mm cyst inferiorly in the spleen, highly likely to be benign and incidental. Adrenals/Urinary Tract: Right hydronephrosis with dilated renal pelvis and mild proximal right hydroureter. Borderline prominent left renal pelvis without left hydroureter. Adrenal glands unremarkable. On image 12 series 16 there is a suggestion of a 5 mm filling defect in the right lower renal pelvis, but no stone is visible in this vicinity on today or prior exams and  there is no enhancement in this vicinity, an/suspect that this is a flow artifact. Stomach/Bowel: Gastric bypass. Substantial prominence of gas and stool in the colon, possibly physiologic dilatation. Dilated gas-filled sigmoid colon, today's MRI is of the upper abdomen and does not assess the pelvis for entities such as sigmoid volvulus. Vascular/Lymphatic:  Unremarkable Other:  Mild ascites.  Mild mesenteric and subcutaneous edema. Musculoskeletal: Degenerative disc disease and loss of disc height at the L5-S1 level. IMPRESSION: 1. Right hydronephrosis with dilated renal pelvis and mild proximal right hydroureter. Borderline prominent left renal pelvis without left hydroureter. 2. Cholecystectomy. Chronic biliary prominence not changed from 2023, at least  partially due to a physiologic effective cholecystectomy. No filling defect in the biliary tree or obstructing lesion in the vicinity of the ampulla identified. 3. Trace right pleural effusion with dependent atelectasis in both lower lobes. 4. Mild ascites. Mild mesenteric and subcutaneous edema. 5. Substantial prominence of gas and stool in the colon, possibly physiologic dilatation. 6. Degenerative disc disease and loss of disc height at the L5-S1 level. Electronically Signed   By: Ryan Salvage M.D.   On: 08/21/2023 20:22   CT ABDOMEN PELVIS WO CONTRAST Result Date: 08/21/2023 CLINICAL DATA:  Right lower quadrant abdominal pain. EXAM: CT ABDOMEN AND PELVIS WITHOUT CONTRAST TECHNIQUE: Multidetector CT imaging of the abdomen and pelvis was performed following the standard protocol without IV contrast. RADIATION DOSE REDUCTION: This exam was performed according to the departmental dose-optimization program which includes automated exposure control, adjustment of the mA and/or kV according to patient size and/or use of iterative reconstruction technique. COMPARISON:  CT abdomen pelvis dated 08/19/2023. FINDINGS: Evaluation of this exam is limited in the absence of intravenous contrast. Lower chest: Small bilateral pleural effusions. No intra-abdominal free air.  Small free fluid in the pelvis. Hepatobiliary: The liver is unremarkable. Cholecystectomy. Dilated common bile duct measuring approximately 2 cm in diameter. No retained calcified stone noted in the central CBD Pancreas: The pancreas is unremarkable as visualized Spleen: Normal in size without focal abnormality. Adrenals/Urinary Tract: The adrenal glands unremarkable. Bilateral extrarenal pelvis with mild pelviectasis increased since the prior CT. No renal calculi. The visualized ureters and urinary bladder appear unremarkable. Stomach/Bowel: There is postsurgical changes of the bowel. There is no bowel obstruction. The appendix is normal.  Vascular/Lymphatic: The abdominal aorta and IVC are unremarkable. No portal venous gas. There is no adenopathy. Reproductive: Hysterectomy.  No suspicious adnexal masses. Other: Midline anterior pelvic wall incisional scar. Musculoskeletal: No acute osseous pathology. IMPRESSION: 1. No acute intra-abdominal or pelvic pathology. No bowel obstruction. Normal appendix. 2. Small bilateral pleural effusions. Electronically Signed   By: Vanetta Chou M.D.   On: 08/21/2023 14:58         Scheduled Meds:  clonazePAM   0.5 mg Oral BID   dicyclomine   20 mg Oral TID AC & HS   lamoTRIgine   200 mg Oral Daily   levothyroxine   200 mcg Oral QAC breakfast   methocarbamol   750 mg Oral QID   polyethylene glycol  17 g Oral Daily   propranolol   40 mg Oral BID   QUEtiapine   600 mg Oral QHS   simethicone   80 mg Oral QID   Continuous Infusions:     LOS: 3 days     Calvin KATHEE Robson, MD [facility]   If 7PM-7AM, please contact night-coverage  08/22/2023, 12:58 PM

## 2023-08-23 DIAGNOSIS — K922 Gastrointestinal hemorrhage, unspecified: Secondary | ICD-10-CM | POA: Diagnosis not present

## 2023-08-23 MED ORDER — DICLOFENAC SODIUM 1 % EX GEL
4.0000 g | Freq: Four times a day (QID) | CUTANEOUS | Status: DC
  Administered 2023-08-23 – 2023-08-27 (×17): 4 g via TOPICAL
  Filled 2023-08-23: qty 100

## 2023-08-23 MED ORDER — SODIUM CHLORIDE 0.9 % IV SOLN
12.5000 mg | Freq: Four times a day (QID) | INTRAVENOUS | Status: DC | PRN
  Administered 2023-08-23 – 2023-08-24 (×2): 12.5 mg via INTRAVENOUS
  Filled 2023-08-23: qty 12.5
  Filled 2023-08-23: qty 0.5
  Filled 2023-08-23: qty 12.5

## 2023-08-23 MED ORDER — GABAPENTIN 300 MG PO CAPS
300.0000 mg | ORAL_CAPSULE | Freq: Two times a day (BID) | ORAL | Status: DC
  Filled 2023-08-23 (×3): qty 1

## 2023-08-23 NOTE — Plan of Care (Signed)

## 2023-08-23 NOTE — Progress Notes (Signed)
 PROGRESS NOTE    Rhonda Palmer  FMW:[id number] DOB: 06-Jan-1971 DOA: 08/19/2023 PCP: Lorel Maxie LABOR, MD    Brief Narrative:  53 y.o. female with medical history significant of TIA on Plavix , IIDM, hypothyroidism, anxiety/depression, chronic back pain, chronic pain syndrome presented with sudden onset of abdominal pain and lower GI bleed.   Patient was doing fine until yesterday evening, she started to have severe right lower quadrant abdominal pain sharp like constant, with nausea but no vomiting.  She felt  must be my appendix and came to ED.  Overnight, she passed gas and this morning she passed a large bowel movement, with hard stool and coated with bright red blood and left lower quadrant abdominal pain persisted after the bowel movement.  Denied any history of abdominal pain of similar nature, no history of previous colonoscopies.  Patient has been Ozempic for weight loss, managed about 35 pound loss in the last 6 months.  No history of hemorrhoids.  Seen in consultation by GI.  Plan for diagnostic colonoscopy 7/24  Multiple imaging studies and colonoscopy unrevealing for intra-abdominal source.  GI signed off 7/26.  Assessment & Plan:   Principal Problem:   GI bleed Active Problems:   Abdominal pain   Lower GI bleed   Right lower quadrant pain   Rectal bleeding   RLQ abdominal pain  Colon ileus Hematochezia Intractable abdominal pain Abdominal pain is not entirely of clear etiology. Could be due to ileus GI consulted Colonoscopy done 7/24 No bleeding noted.  Likely internal hemorrhoids with the source of hematochezia CT Noncon and MRCP performed.  Both unrevealing for GI source of illness.  No acute biliary ductal dilatation to suggest obstructive stone.  No evidence of nephrolithiasis. Pelvic ultrasound negative for acute issues Plan: Unclear nature of pain.  Suspect musculoskeletal as pain tends to be positional in nature.  Sometimes associated with nausea though  patient seems to be tolerating.  Will attempt to optimize oral pain regimen.  Discontinue IV narcotics.  Hopeful discharge in 1 day.  Intra and extrahepatic biliary ductal dilatation Unclear etiology.  No obstructing stones.  No elevation in transaminitis suggest word no elevation in liver enzymes or bilirubin to suggest CBD pathology.  MRCP unrevealing for acute pathology.  Will need to be followed as outpatient   IIDM SSI for now   Hx of TIA Continue Plavix , resume 7/26   HTN Continue propranolol    Hypothyroidism Continue Synthroid    Weight loss Intentional.  Has been on Ozempic with good result.   DVT prophylaxis: SQ Lovenox  Code Status: Full Family Communication: None Disposition Plan: Status is: Inpatient Remains inpatient appropriate because: Intractable abdominal pain.  Unclear etiology   Level of care: Telemetry Medical  Consultants:  GI  Procedures:  Colonoscopy  Antimicrobials: None   Subjective: Seen and examined.  Lying in bed.  No appreciable changes in symptoms over interval.  Talked with Objective: Vitals:   08/22/23 2112 08/23/23 0139 08/23/23 0403 08/23/23 0802  BP: 120/79 91/66 92/74  122/77  Pulse: 68 63 (!) 58 (!) 59  Resp:  16 17 14   Temp:  98.1 F (36.7 C) 98.1 F (36.7 C) 97.8 F (36.6 C)  TempSrc:  Oral Oral Oral  SpO2:  93% 94% 98%  Weight:      Height:        Intake/Output Summary (Last 24 hours) at 08/23/2023 1122 Last data filed at 08/23/2023 0900 Gross per 24 hour  Intake 480 ml  Output --  Net 480  ml   Filed Weights   08/19/23 0451 08/20/23 1339  Weight: 65.8 kg 65.8 kg    Examination:  General exam: NAD.  Appears fatigued Respiratory system: Clear to auscultation. Respiratory effort normal. Cardiovascular system: S1-S2, RRR, no murmurs, no pedal edema Gastrointestinal system: Soft, nondistended, mild TTP right lower quadrant, normal bowel sounds Central nervous system: Alert and oriented. No focal neurological  deficits. Extremities: Symmetric 5 x 5 power. Skin: No rashes, lesions or ulcers Psychiatry: Judgement and insight appear normal. Mood & affect appropriate.     Data Reviewed: I have personally reviewed following labs and imaging studies  CBC: Recent Labs  Lab 08/19/23 0517 08/19/23 1035 08/19/23 1728 08/19/23 2200 08/20/23 0403 08/22/23 1011  WBC 5.4  --   --   --  4.1 5.5  NEUTROABS 2.8  --   --   --   --  2.3  HGB 12.4 11.0* 11.0* 10.7* 11.9* 11.4*  HCT 40.0 35.3* 35.7* 33.5* 38.2 36.2  MCV 92.4  --   --   --  92.9 92.1  PLT 216  --   --   --  191 203   Basic Metabolic Panel: Recent Labs  Lab 08/19/23 0517 08/20/23 0403 08/22/23 1011  NA 138 144 140  K 3.8 3.5 3.5  CL 106 108 108  CO2 27 26 26   GLUCOSE 93 91 103*  BUN 15 7 6   CREATININE 0.54 0.60 0.50  CALCIUM  9.2 9.0 8.8*   GFR: Estimated Creatinine Clearance: 67.2 mL/min (by C-G formula based on SCr of 0.5 mg/dL). Liver Function Tests: Recent Labs  Lab 08/19/23 0517 08/21/23 1619  AST 22 30  ALT 18 23  ALKPHOS 104 113  BILITOT 0.6 0.6  PROT 6.7 5.3*  ALBUMIN  3.4* 2.7*   Recent Labs  Lab 08/19/23 0517  LIPASE 22   No results for input(s): AMMONIA in the last 168 hours. Coagulation Profile: No results for input(s): INR, PROTIME in the last 168 hours. Cardiac Enzymes: No results for input(s): CKTOTAL, CKMB, CKMBINDEX, TROPONINI in the last 168 hours. BNP (last 3 results) No results for input(s): PROBNP in the last 8760 hours. HbA1C: No results for input(s): HGBA1C in the last 72 hours. CBG: No results for input(s): GLUCAP in the last 168 hours. Lipid Profile: No results for input(s): CHOL, HDL, LDLCALC, TRIG, CHOLHDL, LDLDIRECT in the last 72 hours. Thyroid  Function Tests: No results for input(s): TSH, T4TOTAL, FREET4, T3FREE, THYROIDAB in the last 72 hours.  Anemia Panel: No results for input(s): VITAMINB12, FOLATE, FERRITIN, TIBC, IRON,  RETICCTPCT in the last 72 hours. Sepsis Labs: No results for input(s): PROCALCITON, LATICACIDVEN in the last 168 hours.  No results found for this or any previous visit (from the past 240 hours).       Radiology Studies: US  PELVIC COMPLETE WITH TRANSVAGINAL Result Date: 08/22/2023 CLINICAL DATA:  Right-sided pain. EXAM: TRANSABDOMINAL AND TRANSVAGINAL ULTRASOUND OF PELVIS TECHNIQUE: Both transabdominal and transvaginal ultrasound examinations of the pelvis were performed. Transabdominal technique was performed for global imaging of the pelvis including uterus, ovaries, adnexal regions, and pelvic cul-de-sac. It was necessary to proceed with endovaginal exam following the transabdominal exam to visualize the ovaries. COMPARISON:  CT and MRI 08/21/2023. FINDINGS: Uterus Measurements: Surgically absent. Endometrium Thickness: N/A. Right ovary Measurements: Not visualized, likely due to overlying bowel gas. No discernible adnexal mass. Left ovary Measurements: Not visualized, likely due to overlying bowel gas. No discernible adnexal mass. Other findings Small volume intraperitoneal free fluid in the pelvis,  similar to CT scan yesterday. IMPRESSION: 1. Status post hysterectomy. 2. Nonvisualization of the ovaries. 3. Small volume intraperitoneal free fluid in the pelvis, similar to CT scan yesterday. Electronically Signed   By: Camellia Candle M.D.   On: 08/22/2023 13:43   MR ABDOMEN MRCP W WO CONTAST Result Date: 08/21/2023 CLINICAL DATA:  Right lower quadrant abdominal pain. Biliary dilatation. EXAM: MRI ABDOMEN WITHOUT AND WITH CONTRAST (INCLUDING MRCP) TECHNIQUE: Multiplanar multisequence MR imaging of the abdomen was performed both before and after the administration of intravenous contrast. Heavily T2-weighted images of the biliary and pancreatic ducts were obtained, and three-dimensional MRCP images were rendered by post processing. CONTRAST:  6mL GADAVIST  GADOBUTROL  1 MMOL/ML IV SOLN COMPARISON:   CT scan 08/21/2023 FINDINGS: Lower chest: Trace right pleural effusion with dependent atelectasis in both lower lobes. Hepatobiliary: Cholecystectomy. Dilated cystic duct remnant and mild prominence of intrahepatic biliary and extrahepatic bile ducts, with common hepatic duct measuring 1.7 cm in diameter; proximal common bile duct measuring 1.6 cm in diameter; mid common bile duct measuring 1.1 cm in diameter; and distal common bile duct 1.0 cm in diameter. Very minimally blunted distal margin of the CBD without filling defect observed. Or differential enhancement at the ampulla. Comparing back to December 2023, the common bile duct diameters are comparable, and this is felt to represent a chronic condition potentially at least partially due to physiologic dilatation related to cholecystectomy No significant focal parenchymal hepatic lesion is observed. Pancreas:  Upper normal sized dorsal pancreatic duct. Spleen: 6 mm cyst inferiorly in the spleen, highly likely to be benign and incidental. Adrenals/Urinary Tract: Right hydronephrosis with dilated renal pelvis and mild proximal right hydroureter. Borderline prominent left renal pelvis without left hydroureter. Adrenal glands unremarkable. On image 12 series 16 there is a suggestion of a 5 mm filling defect in the right lower renal pelvis, but no stone is visible in this vicinity on today or prior exams and there is no enhancement in this vicinity, an/suspect that this is a flow artifact. Stomach/Bowel: Gastric bypass. Substantial prominence of gas and stool in the colon, possibly physiologic dilatation. Dilated gas-filled sigmoid colon, today's MRI is of the upper abdomen and does not assess the pelvis for entities such as sigmoid volvulus. Vascular/Lymphatic:  Unremarkable Other:  Mild ascites.  Mild mesenteric and subcutaneous edema. Musculoskeletal: Degenerative disc disease and loss of disc height at the L5-S1 level. IMPRESSION: 1. Right hydronephrosis with  dilated renal pelvis and mild proximal right hydroureter. Borderline prominent left renal pelvis without left hydroureter. 2. Cholecystectomy. Chronic biliary prominence not changed from 2023, at least partially due to a physiologic effective cholecystectomy. No filling defect in the biliary tree or obstructing lesion in the vicinity of the ampulla identified. 3. Trace right pleural effusion with dependent atelectasis in both lower lobes. 4. Mild ascites. Mild mesenteric and subcutaneous edema. 5. Substantial prominence of gas and stool in the colon, possibly physiologic dilatation. 6. Degenerative disc disease and loss of disc height at the L5-S1 level. Electronically Signed   By: Ryan Salvage M.D.   On: 08/21/2023 20:22   MR 3D Recon At Scanner Result Date: 08/21/2023 CLINICAL DATA:  Right lower quadrant abdominal pain. Biliary dilatation. EXAM: MRI ABDOMEN WITHOUT AND WITH CONTRAST (INCLUDING MRCP) TECHNIQUE: Multiplanar multisequence MR imaging of the abdomen was performed both before and after the administration of intravenous contrast. Heavily T2-weighted images of the biliary and pancreatic ducts were obtained, and three-dimensional MRCP images were rendered by post processing. CONTRAST:  6mL GADAVIST   GADOBUTROL  1 MMOL/ML IV SOLN COMPARISON:  CT scan 08/21/2023 FINDINGS: Lower chest: Trace right pleural effusion with dependent atelectasis in both lower lobes. Hepatobiliary: Cholecystectomy. Dilated cystic duct remnant and mild prominence of intrahepatic biliary and extrahepatic bile ducts, with common hepatic duct measuring 1.7 cm in diameter; proximal common bile duct measuring 1.6 cm in diameter; mid common bile duct measuring 1.1 cm in diameter; and distal common bile duct 1.0 cm in diameter. Very minimally blunted distal margin of the CBD without filling defect observed. Or differential enhancement at the ampulla. Comparing back to December 2023, the common bile duct diameters are comparable, and  this is felt to represent a chronic condition potentially at least partially due to physiologic dilatation related to cholecystectomy No significant focal parenchymal hepatic lesion is observed. Pancreas:  Upper normal sized dorsal pancreatic duct. Spleen: 6 mm cyst inferiorly in the spleen, highly likely to be benign and incidental. Adrenals/Urinary Tract: Right hydronephrosis with dilated renal pelvis and mild proximal right hydroureter. Borderline prominent left renal pelvis without left hydroureter. Adrenal glands unremarkable. On image 12 series 16 there is a suggestion of a 5 mm filling defect in the right lower renal pelvis, but no stone is visible in this vicinity on today or prior exams and there is no enhancement in this vicinity, an/suspect that this is a flow artifact. Stomach/Bowel: Gastric bypass. Substantial prominence of gas and stool in the colon, possibly physiologic dilatation. Dilated gas-filled sigmoid colon, today's MRI is of the upper abdomen and does not assess the pelvis for entities such as sigmoid volvulus. Vascular/Lymphatic:  Unremarkable Other:  Mild ascites.  Mild mesenteric and subcutaneous edema. Musculoskeletal: Degenerative disc disease and loss of disc height at the L5-S1 level. IMPRESSION: 1. Right hydronephrosis with dilated renal pelvis and mild proximal right hydroureter. Borderline prominent left renal pelvis without left hydroureter. 2. Cholecystectomy. Chronic biliary prominence not changed from 2023, at least partially due to a physiologic effective cholecystectomy. No filling defect in the biliary tree or obstructing lesion in the vicinity of the ampulla identified. 3. Trace right pleural effusion with dependent atelectasis in both lower lobes. 4. Mild ascites. Mild mesenteric and subcutaneous edema. 5. Substantial prominence of gas and stool in the colon, possibly physiologic dilatation. 6. Degenerative disc disease and loss of disc height at the L5-S1 level.  Electronically Signed   By: Ryan Salvage M.D.   On: 08/21/2023 20:22   CT ABDOMEN PELVIS WO CONTRAST Result Date: 08/21/2023 CLINICAL DATA:  Right lower quadrant abdominal pain. EXAM: CT ABDOMEN AND PELVIS WITHOUT CONTRAST TECHNIQUE: Multidetector CT imaging of the abdomen and pelvis was performed following the standard protocol without IV contrast. RADIATION DOSE REDUCTION: This exam was performed according to the departmental dose-optimization program which includes automated exposure control, adjustment of the mA and/or kV according to patient size and/or use of iterative reconstruction technique. COMPARISON:  CT abdomen pelvis dated 08/19/2023. FINDINGS: Evaluation of this exam is limited in the absence of intravenous contrast. Lower chest: Small bilateral pleural effusions. No intra-abdominal free air.  Small free fluid in the pelvis. Hepatobiliary: The liver is unremarkable. Cholecystectomy. Dilated common bile duct measuring approximately 2 cm in diameter. No retained calcified stone noted in the central CBD Pancreas: The pancreas is unremarkable as visualized Spleen: Normal in size without focal abnormality. Adrenals/Urinary Tract: The adrenal glands unremarkable. Bilateral extrarenal pelvis with mild pelviectasis increased since the prior CT. No renal calculi. The visualized ureters and urinary bladder appear unremarkable. Stomach/Bowel: There is postsurgical changes of the  bowel. There is no bowel obstruction. The appendix is normal. Vascular/Lymphatic: The abdominal aorta and IVC are unremarkable. No portal venous gas. There is no adenopathy. Reproductive: Hysterectomy.  No suspicious adnexal masses. Other: Midline anterior pelvic wall incisional scar. Musculoskeletal: No acute osseous pathology. IMPRESSION: 1. No acute intra-abdominal or pelvic pathology. No bowel obstruction. Normal appendix. 2. Small bilateral pleural effusions. Electronically Signed   By: Vanetta Chou M.D.   On:  08/21/2023 14:58         Scheduled Meds:  clonazePAM   0.5 mg Oral BID   clopidogrel   75 mg Oral Daily   diclofenac  Sodium  4 g Topical QID   dicyclomine   20 mg Oral TID AC & HS   enoxaparin  (LOVENOX ) injection  40 mg Subcutaneous Q24H   gabapentin   300 mg Oral BID   lamoTRIgine   200 mg Oral Daily   levothyroxine   200 mcg Oral QAC breakfast   methocarbamol   750 mg Oral QID   polyethylene glycol  17 g Oral Daily   propranolol   40 mg Oral BID   QUEtiapine   600 mg Oral QHS   senna-docusate  1 tablet Oral BID   simethicone   80 mg Oral QID   Continuous Infusions:     LOS: 4 days     Calvin KATHEE Robson, MD [facility]   If 7PM-7AM, please contact night-coverage  08/23/2023, 11:22 AM

## 2023-08-24 ENCOUNTER — Inpatient Hospital Stay: Payer: MEDICAID

## 2023-08-24 DIAGNOSIS — K922 Gastrointestinal hemorrhage, unspecified: Secondary | ICD-10-CM | POA: Diagnosis not present

## 2023-08-24 LAB — BASIC METABOLIC PANEL WITH GFR
Anion gap: 7 (ref 5–15)
BUN: 8 mg/dL (ref 6–20)
CO2: 29 mmol/L (ref 22–32)
Calcium: 9 mg/dL (ref 8.9–10.3)
Chloride: 106 mmol/L (ref 98–111)
Creatinine, Ser: 0.61 mg/dL (ref 0.44–1.00)
GFR, Estimated: >60 mL/min (ref >60)
Glucose, Bld: 109 mg/dL — ABNORMAL HIGH (ref 70–99)
Potassium: 3.8 mmol/L (ref 3.5–5.1)
Sodium: 142 mmol/L (ref 135–145)

## 2023-08-24 LAB — CBC WITH DIFFERENTIAL/PLATELET
Abs Immature Granulocytes: 0.01 K/uL (ref 0.00–0.07)
Basophils Absolute: 0 K/uL (ref 0.0–0.1)
Basophils Relative: 1 %
Eosinophils Absolute: 0.4 K/uL (ref 0.0–0.5)
Eosinophils Relative: 8 %
HCT: 37.6 % (ref 36.0–46.0)
Hemoglobin: 11.7 g/dL — ABNORMAL LOW (ref 12.0–15.0)
Immature Granulocytes: 0 %
Lymphocytes Relative: 44 %
Lymphs Abs: 2 K/uL (ref 0.7–4.0)
MCH: 28.3 pg (ref 26.0–34.0)
MCHC: 31.1 g/dL (ref 30.0–36.0)
MCV: 91 fL (ref 80.0–100.0)
Monocytes Absolute: 0.4 K/uL (ref 0.1–1.0)
Monocytes Relative: 10 %
Neutro Abs: 1.6 K/uL — ABNORMAL LOW (ref 1.7–7.7)
Neutrophils Relative %: 37 %
Platelets: 202 K/uL (ref 150–400)
RBC: 4.13 MIL/uL (ref 3.87–5.11)
RDW: 13.5 % (ref 11.5–15.5)
WBC: 4.5 K/uL (ref 4.0–10.5)
nRBC: 0 % (ref 0.0–0.2)

## 2023-08-24 MED ORDER — PREGABALIN 75 MG PO CAPS
75.0000 mg | ORAL_CAPSULE | Freq: Two times a day (BID) | ORAL | Status: DC
  Administered 2023-08-24 – 2023-08-25 (×3): 75 mg via ORAL
  Filled 2023-08-24 (×3): qty 1

## 2023-08-24 MED ORDER — METOCLOPRAMIDE HCL 5 MG/ML IJ SOLN
10.0000 mg | Freq: Three times a day (TID) | INTRAMUSCULAR | Status: DC
  Administered 2023-08-24 – 2023-08-25 (×4): 10 mg via INTRAVENOUS
  Filled 2023-08-24 (×4): qty 2

## 2023-08-24 MED ORDER — GADOBUTROL 1 MMOL/ML IV SOLN
6.0000 mL | Freq: Once | INTRAVENOUS | Status: AC | PRN
  Administered 2023-08-24: 6 mL via INTRAVENOUS

## 2023-08-24 NOTE — Progress Notes (Signed)
 PROGRESS NOTE    Rhonda Palmer  FMW:[id number] DOB: [birth year] DOA: 08/19/2023 PCP: Lorel Maxie LABOR, MD    Brief Narrative:  53 y.o. female with medical history significant of TIA on Plavix , IIDM, hypothyroidism, anxiety/depression, chronic back pain, chronic pain syndrome presented with sudden onset of abdominal pain and lower GI bleed.   Patient was doing fine until yesterday evening, she started to have severe right lower quadrant abdominal pain sharp like constant, with nausea but no vomiting.  She felt  must be my appendix and came to ED.  Overnight, she passed gas and this morning she passed a large bowel movement, with hard stool and coated with bright red blood and left lower quadrant abdominal pain persisted after the bowel movement.  Denied any history of abdominal pain of similar nature, no history of previous colonoscopies.  Patient has been Ozempic for weight loss, managed about 35 pound loss in the last 6 months.  No history of hemorrhoids.  Seen in consultation by GI.  Plan for diagnostic colonoscopy 7/24  Multiple imaging studies and colonoscopy unrevealing for intra-abdominal source.  GI signed off 7/26.  Assessment & Plan:   Principal Problem:   GI bleed Active Problems:   Abdominal pain   Lower GI bleed   Right lower quadrant pain   Rectal bleeding   RLQ abdominal pain  Colon ileus Hematochezia Intractable abdominal pain Abdominal pain is not entirely of clear etiology. Could be due to ileus GI consulted Colonoscopy done 7/24 No bleeding noted.  Likely internal hemorrhoids with the source of hematochezia CT Noncon and MRCP performed.  Both unrevealing for GI source of illness.  No acute biliary ductal dilatation to suggest obstructive stone.  No evidence of nephrolithiasis. Pelvic ultrasound negative for acute issues Still endorsing pain Plan: Pain of unclear nature.  Check MRI right femur.  Add Lyrica  75 twice daily.  Discontinue simethicone  and Bentyl .   Add Reglan  10 3 times daily  Intra and extrahepatic biliary ductal dilatation Unclear etiology.  No obstructing stones.  No elevation in transaminitis suggest word no elevation in liver enzymes or bilirubin to suggest CBD pathology.  MRCP unrevealing for acute pathology.  Will need to be followed as outpatient   IIDM SSI for now   Hx of TIA Continue Plavix    HTN Continue propranolol    Hypothyroidism Continue Synthroid    Weight loss Intentional.  Has been on Ozempic with good result.   DVT prophylaxis: SQ Lovenox  Code Status: Full Family Communication: None Disposition Plan: Status is: Inpatient Remains inpatient appropriate because: Intractable abdominal pain.  Unclear etiology   Level of care: Telemetry Medical  Consultants:  GI  Procedures:  Colonoscopy  Antimicrobials: None   Subjective: Seen examined.  Lying in bed.  Reports some pain is worse over interval.  Still with nausea.  Objective: Vitals:   08/23/23 2146 08/24/23 0439 08/24/23 0440 08/24/23 0805  BP: 109/74 94/62 108/76 124/82  Pulse: 66 60 60 61  Resp:  16  16  Temp:  97.8 F (36.6 C)  97.6 F (36.4 C)  TempSrc:  Oral  Oral  SpO2:  97% 97% 97%  Weight:      Height:        Intake/Output Summary (Last 24 hours) at 08/24/2023 1302 Last data filed at 08/24/2023 0300 Gross per 24 hour  Intake 50 ml  Output --  Net 50 ml   Filed Weights   08/19/23 0451 08/20/23 1339  Weight: 65.8 kg 65.8 kg  Examination:  General exam: No acute distress.  Appears fatigued Respiratory system: Clear to auscultation. Respiratory effort normal. Cardiovascular system: S1-S2, RRR, no murmurs, no pedal edema Gastrointestinal system: Soft, nondistended, nontender, normal bowel sounds Central nervous system: Alert and oriented. No focal neurological deficits. Extremities: Symmetric 5 x 5 power. Skin: No rashes, lesions or ulcers Psychiatry: Judgement and insight appear normal. Mood & affect appropriate.      Data Reviewed: I have personally reviewed following labs and imaging studies  CBC: Recent Labs  Lab 08/19/23 0517 08/19/23 1035 08/19/23 1728 08/19/23 2200 08/20/23 0403 08/22/23 1011 08/24/23 0850  WBC 5.4  --   --   --  4.1 5.5 4.5  NEUTROABS 2.8  --   --   --   --  2.3 1.6*  HGB 12.4   < > 11.0* 10.7* 11.9* 11.4* 11.7*  HCT 40.0   < > 35.7* 33.5* 38.2 36.2 37.6  MCV 92.4  --   --   --  92.9 92.1 91.0  PLT 216  --   --   --  191 203 202   < > = values in this interval not displayed.   Basic Metabolic Panel: Recent Labs  Lab 08/19/23 0517 08/20/23 0403 08/22/23 1011 08/24/23 0850  NA 138 144 140 142  K 3.8 3.5 3.5 3.8  CL 106 108 108 106  CO2 27 26 26 29   GLUCOSE 93 91 103* 109*  BUN 15 7 6 8   CREATININE 0.54 0.60 0.50 0.61  CALCIUM  9.2 9.0 8.8* 9.0   GFR: Estimated Creatinine Clearance: 67.2 mL/min (by C-G formula based on SCr of 0.61 mg/dL). Liver Function Tests: Recent Labs  Lab 08/19/23 0517 08/21/23 1619  AST 22 30  ALT 18 23  ALKPHOS 104 113  BILITOT 0.6 0.6  PROT 6.7 5.3*  ALBUMIN  3.4* 2.7*   Recent Labs  Lab 08/19/23 0517  LIPASE 22   No results for input(s): AMMONIA in the last 168 hours. Coagulation Profile: No results for input(s): INR, PROTIME in the last 168 hours. Cardiac Enzymes: No results for input(s): CKTOTAL, CKMB, CKMBINDEX, TROPONINI in the last 168 hours. BNP (last 3 results) No results for input(s): PROBNP in the last 8760 hours. HbA1C: No results for input(s): HGBA1C in the last 72 hours. CBG: No results for input(s): GLUCAP in the last 168 hours. Lipid Profile: No results for input(s): CHOL, HDL, LDLCALC, TRIG, CHOLHDL, LDLDIRECT in the last 72 hours. Thyroid  Function Tests: No results for input(s): TSH, T4TOTAL, FREET4, T3FREE, THYROIDAB in the last 72 hours.  Anemia Panel: No results for input(s): VITAMINB12, FOLATE, FERRITIN, TIBC, IRON, RETICCTPCT in the  last 72 hours. Sepsis Labs: No results for input(s): PROCALCITON, LATICACIDVEN in the last 168 hours.  No results found for this or any previous visit (from the past 240 hours).       Radiology Studies: No results found.        Scheduled Meds:  clonazePAM   0.5 mg Oral BID   clopidogrel   75 mg Oral Daily   diclofenac  Sodium  4 g Topical QID   enoxaparin  (LOVENOX ) injection  40 mg Subcutaneous Q24H   lamoTRIgine   200 mg Oral Daily   levothyroxine   200 mcg Oral QAC breakfast   methocarbamol   750 mg Oral QID   metoCLOPramide  (REGLAN ) injection  10 mg Intravenous Q8H   polyethylene glycol  17 g Oral Daily   pregabalin   75 mg Oral BID   propranolol   40 mg Oral BID  QUEtiapine   600 mg Oral QHS   senna-docusate  1 tablet Oral BID   Continuous Infusions:  promethazine  (PHENERGAN ) injection (IM or IVPB) 12.5 mg (08/24/23 0853)      LOS: 5 days     Calvin KATHEE Robson, MD [facility]   If 7PM-7AM, please contact night-coverage  08/24/2023, 1:02 PM

## 2023-08-24 NOTE — Plan of Care (Signed)
  Problem: Pain Managment: Goal: General experience of comfort will improve and/or be controlled Outcome: Progressing   Problem: Safety: Goal: Ability to remain free from injury will improve Outcome: Progressing   Problem: Clinical Measurements: Goal: Complications related to the disease process, condition or treatment will be avoided or minimized Outcome: Progressing   Problem: Health Behavior/Discharge Planning: Goal: Ability to manage health-related needs will improve Outcome: Progressing   Problem: Clinical Measurements: Goal: Ability to maintain clinical measurements within normal limits will improve Outcome: Progressing   Problem: Clinical Measurements: Goal: Will remain free from infection Outcome: Progressing

## 2023-08-25 DIAGNOSIS — K922 Gastrointestinal hemorrhage, unspecified: Secondary | ICD-10-CM | POA: Diagnosis not present

## 2023-08-25 MED ORDER — PREGABALIN 50 MG PO CAPS
100.0000 mg | ORAL_CAPSULE | Freq: Two times a day (BID) | ORAL | Status: DC
  Administered 2023-08-25 – 2023-08-27 (×4): 100 mg via ORAL
  Filled 2023-08-25 (×4): qty 2

## 2023-08-25 MED ORDER — METHOCARBAMOL 500 MG PO TABS
750.0000 mg | ORAL_TABLET | Freq: Four times a day (QID) | ORAL | Status: DC
  Administered 2023-08-25 – 2023-08-27 (×6): 750 mg via ORAL
  Filled 2023-08-25 (×7): qty 2

## 2023-08-25 MED ORDER — SMOG ENEMA
960.0000 mL | Freq: Once | RECTAL | Status: AC
  Administered 2023-08-25: 960 mL via RECTAL
  Filled 2023-08-25: qty 960

## 2023-08-25 MED ORDER — OXYCODONE HCL 5 MG PO TABS
5.0000 mg | ORAL_TABLET | ORAL | Status: DC | PRN
  Administered 2023-08-25 – 2023-08-27 (×9): 5 mg via ORAL
  Filled 2023-08-25 (×9): qty 1

## 2023-08-25 MED ORDER — METHOCARBAMOL 1000 MG/10ML IJ SOLN: 1000.0000 mg | Freq: Three times a day (TID) | INTRAMUSCULAR | Status: DC

## 2023-08-25 MED ORDER — KETOROLAC TROMETHAMINE 15 MG/ML IJ SOLN
15.0000 mg | Freq: Four times a day (QID) | INTRAMUSCULAR | Status: DC
  Administered 2023-08-25: 15 mg via INTRAVENOUS
  Filled 2023-08-25: qty 1

## 2023-08-25 NOTE — Evaluation (Signed)
 Physical Therapy Evaluation Patient Details Name: Rhonda Palmer MRN: [id number] DOB: [birth year] Today's Date: 08/25/2023  History of Present Illness  presented to ER secondary to abdominal pain; admitted for management of GIB, ileus  Clinical Impression  Patient resting in bed upon arrival to room; alert and oriented, follows commands and agreeable to participation with session.  Endorses R lower abdominal/anterior hip pain, 7/10; meds received prior to session, hot pack in place upon arrival to room.  Pain appears generalized; not point tender to any specific area, unable to specifically replicate, unchanged with mobility efforts during session. Bilat UE/LE strength and ROM grossly symmetrical WFL; no focal weakness appreciated.  Able to complete bed mobility with mod indep; sit/stand, basic transfers and gait (200') with L HHA, min assist.  Demonstrates broad BOS with increased sway/weight shift in all directions; consistently reaching for walls/furniture for external stabilization (improved with L HHA). Minimal/no change in R abdominal/hip pain with gait efforts. Did discuss/encourage use of RW; patient declining at this time  Would benefit from skilled PT to address above deficits and promote optimal return to PLOF.; recommend post-acute PT follow up as indicated by interdisciplinary care team.          If plan is discharge home, recommend the following: A little help with walking and/or transfers;A little help with bathing/dressing/bathroom   Can travel by private vehicle        Equipment Recommendations Rolling walker (2 wheels)  Recommendations for Other Services       Functional Status Assessment Patient has had a recent decline in their functional status and demonstrates the ability to make significant improvements in function in a reasonable and predictable amount of time.     Precautions / Restrictions Precautions Precautions: Fall Restrictions Weight Bearing  Restrictions Per Provider Order: No      Mobility  Bed Mobility Overal bed mobility: Modified Independent                  Transfers Overall transfer level: Needs assistance Equipment used: 1 person hand held assist Transfers: Sit to/from Stand Sit to Stand: Min assist, Contact guard assist           General transfer comment: prefers UE support for external stabilization    Ambulation/Gait Ambulation/Gait assistance: Min assist Gait Distance (Feet): 200 Feet Assistive device: 1 person hand held assist         General Gait Details: broad BOS with increased sway/weight shift in all directions; consistently reaching for walls/furniture for external stabilization (improved with L HHA).  Minimal/no change in R abdominal/hip pain with gait efforts.  Did discuss/encourage use of RW; patient declining at this time  Stairs            Wheelchair Mobility     Tilt Bed    Modified Rankin (Stroke Patients Only)       Balance Overall balance assessment: Needs assistance Sitting-balance support: No upper extremity supported, Feet supported Sitting balance-Leahy Scale: Good     Standing balance support: During functional activity Standing balance-Leahy Scale: Fair                               Pertinent Vitals/Pain Pain Assessment Pain Assessment: 0-10 Pain Score: 7  Pain Location: R lower abdomen/groin Pain Descriptors / Indicators: Guarding Pain Intervention(s): Limited activity within patient's tolerance, Monitored during session, Premedicated before session, Repositioned, Heat applied (had heat pack in place upon arrival to session; replaced  end of session)    Home Living Family/patient expects to be discharged to:: Private residence Living Arrangements: Children Available Help at Discharge: Family;Available PRN/intermittently Type of Home: Apartment Home Access: Level entry                Prior Function Prior Level of Function :  Independent/Modified Independent             Mobility Comments: Indep with ADLs, household and community mobilization without assist device; denies fall history       Extremity/Trunk Assessment   Upper Extremity Assessment Upper Extremity Assessment: Overall WFL for tasks assessed    Lower Extremity Assessment Lower Extremity Assessment: Overall WFL for tasks assessed       Communication        Cognition Arousal: Alert Behavior During Therapy: WFL for tasks assessed/performed   PT - Cognitive impairments: No apparent impairments                                 Cueing       General Comments      Exercises     Assessment/Plan    PT Assessment Patient needs continued PT services  PT Problem List Decreased activity tolerance;Decreased balance;Decreased knowledge of use of DME;Decreased safety awareness;Decreased mobility;Decreased knowledge of precautions;Pain       PT Treatment Interventions DME instruction;Gait training;Stair training;Functional mobility training;Therapeutic activities;Therapeutic exercise;Balance training;Patient/family education    PT Goals (Current goals can be found in the Care Plan section)  Acute Rehab PT Goals Patient Stated Goal: to make the pain better, to go home PT Goal Formulation: With patient Time For Goal Achievement: 09/08/23 Potential to Achieve Goals: Good    Frequency Min 2X/week     Co-evaluation               AM-PAC PT 6 Clicks Mobility  Outcome Measure Help needed turning from your back to your side while in a flat bed without using bedrails?: None Help needed moving from lying on your back to sitting on the side of a flat bed without using bedrails?: None Help needed moving to and from a bed to a chair (including a wheelchair)?: A Little Help needed standing up from a chair using your arms (e.g., wheelchair or bedside chair)?: A Little Help needed to walk in hospital room?: A Little Help  needed climbing 3-5 steps with a railing? : A Little 6 Click Score: 20    End of Session   Activity Tolerance: Patient tolerated treatment well Patient left: in chair;with call bell/phone within reach   PT Visit Diagnosis: Muscle weakness (generalized) (M62.81);Difficulty in walking, not elsewhere classified (R26.2)    Time: 8845-8791 PT Time Calculation (min) (ACUTE ONLY): 14 min   Charges:   PT Evaluation $PT Eval Low Complexity: 1 Low   PT General Charges $$ ACUTE PT VISIT: 1 Visit         Jozalyn Baglio H. Delores, PT, DPT, NCS 08/25/23, 10:34 PM [phone]

## 2023-08-25 NOTE — Progress Notes (Signed)
 PROGRESS NOTE    Rhonda Palmer  FMW:[id number] DOB: 1970-08-18 DOA: 08/19/2023 PCP: Lorel Maxie LABOR, MD    Brief Narrative:  53 y.o. female with medical history significant of TIA on Plavix , IIDM, hypothyroidism, anxiety/depression, chronic back pain, chronic pain syndrome presented with sudden onset of abdominal pain and lower GI bleed.   Patient was doing fine until yesterday evening, she started to have severe right lower quadrant abdominal pain sharp like constant, with nausea but no vomiting.  She felt  must be my appendix and came to ED.  Overnight, she passed gas and this morning she passed a large bowel movement, with hard stool and coated with bright red blood and left lower quadrant abdominal pain persisted after the bowel movement.  Denied any history of abdominal pain of similar nature, no history of previous colonoscopies.  Patient has been Ozempic for weight loss, managed about 35 pound loss in the last 6 months.  No history of hemorrhoids.  Seen in consultation by GI.  Plan for diagnostic colonoscopy 7/24  Multiple imaging studies and colonoscopy unrevealing for intra-abdominal source.  GI signed off 7/26.  Pelvic ultrasound negative for acute GYN issues  Assessment & Plan:   Principal Problem:   GI bleed Active Problems:   Abdominal pain   Lower GI bleed   Right lower quadrant pain   Rectal bleeding   RLQ abdominal pain  Colon ileus Hematochezia Intractable abdominal pain Abdominal pain is not entirely of clear etiology. Could be due to ileus GI consulted Colonoscopy done 7/24 No bleeding noted.  Likely internal hemorrhoids with the source of hematochezia CT Noncon and MRCP performed.  Both unrevealing for GI source of illness.  No acute biliary ductal dilatation to suggest obstructive stone.  No evidence of nephrolithiasis. Pelvic ultrasound negative for acute issues Still endorsing pain MRI right femur with very small ring-enhancing lesion on periphery of  right gluteus maximus.  Unclear etiology but unlikely to be the source of pain. Plan: Pain remains of unclear nature.  Increase Lyrica  to 100 mg twice daily.  Continue Robaxin -750 milligrams 4 times daily add Toradol  15 mg every 6 hours scheduled.  Continue Voltaren  topical 4 times daily.  Discontinue simethicone  and Bentyl .  Diet as tolerated.  Intra and extrahepatic biliary ductal dilatation Unclear etiology.  No obstructing stones.  No elevation in transaminitis suggest word no elevation in liver enzymes or bilirubin to suggest CBD pathology.  MRCP unrevealing for acute pathology.  Will need to be followed as outpatient   IIDM SSI for now   Hx of TIA Continue Plavix    HTN Continue propranolol    Hypothyroidism Continue Synthroid    Weight loss Intentional.  Has been on Ozempic with good result.   DVT prophylaxis: SQ Lovenox  Code Status: Full Family Communication: None Disposition Plan: Status is: Inpatient Remains inpatient appropriate because: Intractable abdominal pain.  Unclear etiology   Level of care: Telemetry Medical  Consultants:  GI  Procedures:  Colonoscopy  Antimicrobials: None   Subjective: Seen and examined.  Sitting up in bed.  Continues to endorse pain but appears more comfortable than prior.  Objective: Vitals:   08/24/23 2013 08/25/23 0427 08/25/23 0457 08/25/23 0805  BP: 100/67 (!) 88/60 105/72 124/77  Pulse: 67 (!) 55 (!) 55 63  Resp: 16 16  16   Temp: 98 F (36.7 C) 98 F (36.7 C)  97.8 F (36.6 C)  TempSrc:    Oral  SpO2: 97% 96%  99%  Weight:  Height:        Intake/Output Summary (Last 24 hours) at 08/25/2023 1359 Last data filed at 08/25/2023 1037 Gross per 24 hour  Intake 0 ml  Output --  Net 0 ml   Filed Weights   08/19/23 0451 08/20/23 1339  Weight: 65.8 kg 65.8 kg    Examination:  General exam: NAD.  Fatigued Respiratory system: Clear to auscultation. Respiratory effort normal. Cardiovascular system: S1-S2, RRR,  no murmurs, no pedal edema Gastrointestinal system: Soft, nondistended, nontender, normal bowel sounds Central nervous system: Alert and oriented. No focal neurological deficits. Extremities: Symmetric 5 x 5 power. Skin: No rashes, lesions or ulcers Psychiatry: Judgement and insight appear normal. Mood & affect appropriate.     Data Reviewed: I have personally reviewed following labs and imaging studies  CBC: Recent Labs  Lab 08/19/23 0517 08/19/23 1035 08/19/23 1728 08/19/23 2200 08/20/23 0403 08/22/23 1011 08/24/23 0850  WBC 5.4  --   --   --  4.1 5.5 4.5  NEUTROABS 2.8  --   --   --   --  2.3 1.6*  HGB 12.4   < > 11.0* 10.7* 11.9* 11.4* 11.7*  HCT 40.0   < > 35.7* 33.5* 38.2 36.2 37.6  MCV 92.4  --   --   --  92.9 92.1 91.0  PLT 216  --   --   --  191 203 202   < > = values in this interval not displayed.   Basic Metabolic Panel: Recent Labs  Lab 08/19/23 0517 08/20/23 0403 08/22/23 1011 08/24/23 0850  NA 138 144 140 142  K 3.8 3.5 3.5 3.8  CL 106 108 108 106  CO2 27 26 26 29   GLUCOSE 93 91 103* 109*  BUN 15 7 6 8   CREATININE 0.54 0.60 0.50 0.61  CALCIUM  9.2 9.0 8.8* 9.0   GFR: Estimated Creatinine Clearance: 67.2 mL/min (by C-G formula based on SCr of 0.61 mg/dL). Liver Function Tests: Recent Labs  Lab 08/19/23 0517 08/21/23 1619  AST 22 30  ALT 18 23  ALKPHOS 104 113  BILITOT 0.6 0.6  PROT 6.7 5.3*  ALBUMIN  3.4* 2.7*   Recent Labs  Lab 08/19/23 0517  LIPASE 22   No results for input(s): AMMONIA in the last 168 hours. Coagulation Profile: No results for input(s): INR, PROTIME in the last 168 hours. Cardiac Enzymes: No results for input(s): CKTOTAL, CKMB, CKMBINDEX, TROPONINI in the last 168 hours. BNP (last 3 results) No results for input(s): PROBNP in the last 8760 hours. HbA1C: No results for input(s): HGBA1C in the last 72 hours. CBG: No results for input(s): GLUCAP in the last 168 hours. Lipid Profile: No results  for input(s): CHOL, HDL, LDLCALC, TRIG, CHOLHDL, LDLDIRECT in the last 72 hours. Thyroid  Function Tests: No results for input(s): TSH, T4TOTAL, FREET4, T3FREE, THYROIDAB in the last 72 hours.  Anemia Panel: No results for input(s): VITAMINB12, FOLATE, FERRITIN, TIBC, IRON, RETICCTPCT in the last 72 hours. Sepsis Labs: No results for input(s): PROCALCITON, LATICACIDVEN in the last 168 hours.  No results found for this or any previous visit (from the past 240 hours).       Radiology Studies: MR FEMUR RIGHT W WO CONTRAST Result Date: 08/25/2023 CLINICAL DATA:  Upper leg pain, stress fracture suspected, neg xray EXAM: MRI OF THE RIGHT FEMUR WITHOUT AND WITH CONTRAST TECHNIQUE: Multiplanar, multisequence MR imaging of the right femur was performed both before and after administration of intravenous contrast. CONTRAST:  6mL GADAVIST  GADOBUTROL   1 MMOL/ML IV SOLN COMPARISON:  None Available. FINDINGS: Bones/Joint/Cartilage No fracture or dislocation. Normal alignment. No joint effusion. No marrow signal abnormality. Ligaments Collateral ligaments are intact.  ACL and PCL are intact. Muscles and Tendons Hamstring tendon origins and gluteal cuff insertions are intact. Quadriceps and patellar tendons are intact. Muscles are normal. Soft tissue Mild subcutaneous edema along the right lateral hip. Partially visualized 7 x 8 mm focus of T2 hyperintensity in the peripheral margin of the right gluteus maximus muscle demonstrating peripheral enhancement (series 20 image 1 and series 14, image 1), this is nonspecific and could reflect small intramuscular abscess, hematoma, or additional cystic lesion. The morphology of the sciatic nerve is normal in there is no perineural fat inflammation. IMPRESSION: 1. No acute osseous abnormality. 2. Partially visualized 7 x 8 mm focus of rim enhancing fluid signal in the periphery of the right gluteus maximus muscle. This is nonspecific and  could reflect a small intramuscular abscess, hematoma, or additional cystic lesion. Clinical correlation is recommended. Short-term follow-up MRI could be considered in 6-8 weeks if clinically warranted. Electronically Signed   By: Harrietta Sherry M.D.   On: 08/25/2023 08:33          Scheduled Meds:  clonazePAM   0.5 mg Oral BID   clopidogrel   75 mg Oral Daily   diclofenac  Sodium  4 g Topical QID   enoxaparin  (LOVENOX ) injection  40 mg Subcutaneous Q24H   ketorolac   15 mg Intravenous Q6H   lamoTRIgine   200 mg Oral Daily   levothyroxine   200 mcg Oral QAC breakfast   methocarbamol   750 mg Oral QID   metoCLOPramide  (REGLAN ) injection  10 mg Intravenous Q8H   polyethylene glycol  17 g Oral Daily   pregabalin   100 mg Oral BID   propranolol   40 mg Oral BID   QUEtiapine   600 mg Oral QHS   senna-docusate  1 tablet Oral BID   Continuous Infusions:  promethazine  (PHENERGAN ) injection (IM or IVPB) Stopped (08/24/23 0913)      LOS: 6 days     Calvin KATHEE Robson, MD [facility]   If 7PM-7AM, please contact night-coverage  08/25/2023, 1:59 PM

## 2023-08-26 DIAGNOSIS — R1084 Generalized abdominal pain: Secondary | ICD-10-CM

## 2023-08-26 MED ORDER — TRAMADOL HCL 50 MG PO TABS
50.0000 mg | ORAL_TABLET | ORAL | Status: DC | PRN
  Administered 2023-08-26: 50 mg via ORAL
  Filled 2023-08-26: qty 1

## 2023-08-26 MED ORDER — BENZOCAINE 10 % MT GEL
Freq: Four times a day (QID) | OROMUCOSAL | Status: DC | PRN
  Filled 2023-08-26: qty 9

## 2023-08-26 NOTE — Plan of Care (Signed)

## 2023-08-26 NOTE — Anesthesia Postprocedure Evaluation (Signed)
 Anesthesia Post Note  Patient: Saralee Bolick  Procedure(s) Performed: COLONOSCOPY  Patient location during evaluation: PACU Anesthesia Type: General Level of consciousness: awake and alert Pain management: pain level controlled Vital Signs Assessment: post-procedure vital signs reviewed and stable Respiratory status: spontaneous breathing, nonlabored ventilation, respiratory function stable and patient connected to nasal cannula oxygen Cardiovascular status: blood pressure returned to baseline and stable Postop Assessment: no apparent nausea or vomiting Anesthetic complications: no   No notable events documented.   Last Vitals:  Vitals:   08/25/23 1924 08/26/23 0322  BP: 100/66 92/80  Pulse: 67 64  Resp: 18 18  Temp: 37 C (!) 36.3 C  SpO2: 96% 94%    Last Pain:  Vitals:   08/26/23 0626  TempSrc:   PainSc: 6                  Debby Mines

## 2023-08-26 NOTE — Progress Notes (Signed)
 PROGRESS NOTE    Rhonda Palmer  FMW:[id number] DOB: Feb 14, 1970 DOA: 08/19/2023 PCP: Lorel Maxie LABOR, MD   Brief Narrative:   53 y.o. female with medical history significant of TIA on Plavix , IIDM, hypothyroidism, anxiety/depression, chronic back pain, chronic pain syndrome presented with sudden onset of abdominal pain and lower GI bleed. Multiple imaging studies and colonoscopy unrevealing for intra-abdominal source.  GI signed off 7/26.   Pelvic ultrasound negative for acute GYN issues. Adjusting pain meds now and plan for dc in the next 24 hours.  Assessment & Plan:  Principal Problem:   GI bleed Active Problems:   Abdominal pain   Lower GI bleed   Right lower quadrant pain   Rectal bleeding   RLQ abdominal pain   Colon ileus Hematochezia, resolved Intractable abdominal pain, improved now  GI signed off Colonoscopy done 7/24: No bleeding noted.  Likely internal hemorrhoids with the source of hematochezia CT Noncon and MRCP performed.  Both unrevealing for GI source of illness.  No acute biliary ductal dilatation to suggest obstructive stone.  No evidence of nephrolithiasis. Pelvic ultrasound negative for acute issues  MRI right femur with very small ring-enhancing lesion on periphery of right gluteus maximus.  Unclear etiology but unlikely to be the source of pain. Plan: Pain remains of unclear nature.  Increase Lyrica  to 100 mg twice daily.  Continue Robaxin -750 milligrams 4 times daily add Toradol  15 mg every 6 hours scheduled.  Continue Voltaren  topical 4 times daily.  Discontinue simethicone  and Bentyl .  Diet as tolerated.   Intra and extrahepatic biliary ductal dilatation Unclear etiology.  No obstructing stones.  No elevation in transaminitis suggest word no elevation in liver enzymes or bilirubin to suggest CBD pathology.  MRCP unrevealing for acute pathology.  Will need to be followed as outpatient   IIDM SSI for now   Hx of TIA Continue Plavix     Hypertension: Continue propranolol    Hypothyroidism Continue Synthroid    Overweight: Has been on Ozempic with good result.  Disposition: Lives at home with her daughter.    DVT prophylaxis: enoxaparin  (LOVENOX ) injection 40 mg Start: 08/22/23 1400 SCDs Start: 08/19/23 9192     Code Status: Full Code Family Communication:   Status is: Inpatient Remains inpatient appropriate because: Pain management    Subjective:  She said her pain is better. She said that the heating pad is helping. She has it over the right groin this morning. Still complaining of nausea. We spoke about discharging her home tomorrow if pain remains manageable.  Examination:  General exam: Appears calm and comfortable  Respiratory system: Clear to auscultation. Respiratory effort normal. Cardiovascular system: S1 & S2 heard, RRR. No JVD, murmurs, rubs, gallops or clicks. No pedal edema. Gastrointestinal system: Abdomen is nondistended, soft and nontender. No organomegaly or masses felt. Normal bowel sounds heard. Central nervous system: Alert and oriented. No focal neurological deficits. Extremities: Symmetric 5 x 5 power. Skin: No rashes, lesions or ulcers Psychiatry: Judgement and insight appear normal. Mood & affect appropriate.     Diet Orders (From admission, onward)     Start     Ordered   08/20/23 1524  Diet regular Fluid consistency: Thin  Diet effective now       Question:  Fluid consistency:  Answer:  Thin   08/20/23 1523            Objective: Vitals:   08/25/23 1549 08/25/23 1924 08/26/23 0322 08/26/23 0752  BP: 102/68 100/66 92/80 108/76  Pulse: 67  67 64 60  Resp: 18 18 18 18   Temp: 98.2 F (36.8 C) 98.6 F (37 C) (!) 97.4 F (36.3 C) 97.7 F (36.5 C)  TempSrc: Oral Oral Oral Oral  SpO2: 98% 96% 94% 98%  Weight:      Height:        Intake/Output Summary (Last 24 hours) at 08/26/2023 1139 Last data filed at 08/26/2023 1100 Gross per 24 hour  Intake 598 ml  Output  --  Net 598 ml   Filed Weights   08/19/23 0451 08/20/23 1339  Weight: 65.8 kg 65.8 kg    Scheduled Meds:  clonazePAM   0.5 mg Oral BID   clopidogrel   75 mg Oral Daily   diclofenac  Sodium  4 g Topical QID   enoxaparin  (LOVENOX ) injection  40 mg Subcutaneous Q24H   lamoTRIgine   200 mg Oral Daily   levothyroxine   200 mcg Oral QAC breakfast   methocarbamol   750 mg Oral QID   polyethylene glycol  17 g Oral Daily   pregabalin   100 mg Oral BID   propranolol   40 mg Oral BID   QUEtiapine   600 mg Oral QHS   senna-docusate  1 tablet Oral BID   Continuous Infusions:  promethazine  (PHENERGAN ) injection (IM or IVPB) Stopped (08/24/23 0913)    Nutritional status     Body mass index is 28.33 kg/m.  Data Reviewed:   CBC: Recent Labs  Lab 08/19/23 1728 08/19/23 2200 08/20/23 0403 08/22/23 1011 08/24/23 0850  WBC  --   --  4.1 5.5 4.5  NEUTROABS  --   --   --  2.3 1.6*  HGB 11.0* 10.7* 11.9* 11.4* 11.7*  HCT 35.7* 33.5* 38.2 36.2 37.6  MCV  --   --  92.9 92.1 91.0  PLT  --   --  191 203 202   Basic Metabolic Panel: Recent Labs  Lab 08/20/23 0403 08/22/23 1011 08/24/23 0850  NA 144 140 142  K 3.5 3.5 3.8  CL 108 108 106  CO2 26 26 29   GLUCOSE 91 103* 109*  BUN 7 6 8   CREATININE 0.60 0.50 0.61  CALCIUM  9.0 8.8* 9.0   GFR: Estimated Creatinine Clearance: 67.2 mL/min (by C-G formula based on SCr of 0.61 mg/dL). Liver Function Tests: Recent Labs  Lab 08/21/23 1619  AST 30  ALT 23  ALKPHOS 113  BILITOT 0.6  PROT 5.3*  ALBUMIN  2.7*   No results for input(s): LIPASE, AMYLASE in the last 168 hours. No results for input(s): AMMONIA in the last 168 hours. Coagulation Profile: No results for input(s): INR, PROTIME in the last 168 hours. Cardiac Enzymes: No results for input(s): CKTOTAL, CKMB, CKMBINDEX, TROPONINI in the last 168 hours. BNP (last 3 results) No results for input(s): PROBNP in the last 8760 hours. HbA1C: No results for input(s):  HGBA1C in the last 72 hours. CBG: No results for input(s): GLUCAP in the last 168 hours. Lipid Profile: No results for input(s): CHOL, HDL, LDLCALC, TRIG, CHOLHDL, LDLDIRECT in the last 72 hours. Thyroid  Function Tests: No results for input(s): TSH, T4TOTAL, FREET4, T3FREE, THYROIDAB in the last 72 hours. Anemia Panel: No results for input(s): VITAMINB12, FOLATE, FERRITIN, TIBC, IRON, RETICCTPCT in the last 72 hours. Sepsis Labs: No results for input(s): PROCALCITON, LATICACIDVEN in the last 168 hours.  No results found for this or any previous visit (from the past 240 hours).       Radiology Studies: MR FEMUR RIGHT W WO CONTRAST Result Date: 08/25/2023 CLINICAL DATA:  Upper leg pain, stress fracture suspected, neg xray EXAM: MRI OF THE RIGHT FEMUR WITHOUT AND WITH CONTRAST TECHNIQUE: Multiplanar, multisequence MR imaging of the right femur was performed both before and after administration of intravenous contrast. CONTRAST:  6mL GADAVIST  GADOBUTROL  1 MMOL/ML IV SOLN COMPARISON:  None Available. FINDINGS: Bones/Joint/Cartilage No fracture or dislocation. Normal alignment. No joint effusion. No marrow signal abnormality. Ligaments Collateral ligaments are intact.  ACL and PCL are intact. Muscles and Tendons Hamstring tendon origins and gluteal cuff insertions are intact. Quadriceps and patellar tendons are intact. Muscles are normal. Soft tissue Mild subcutaneous edema along the right lateral hip. Partially visualized 7 x 8 mm focus of T2 hyperintensity in the peripheral margin of the right gluteus maximus muscle demonstrating peripheral enhancement (series 20 image 1 and series 14, image 1), this is nonspecific and could reflect small intramuscular abscess, hematoma, or additional cystic lesion. The morphology of the sciatic nerve is normal in there is no perineural fat inflammation. IMPRESSION: 1. No acute osseous abnormality. 2. Partially visualized 7 x  8 mm focus of rim enhancing fluid signal in the periphery of the right gluteus maximus muscle. This is nonspecific and could reflect a small intramuscular abscess, hematoma, or additional cystic lesion. Clinical correlation is recommended. Short-term follow-up MRI could be considered in 6-8 weeks if clinically warranted. Electronically Signed   By: Harrietta Sherry M.D.   On: 08/25/2023 08:33        LOS: 7 days   Time spent= 40 mins    Deliliah Room, MD [facility]  If 7PM-7AM, please contact night-coverage  08/26/2023, 11:39 AM

## 2023-08-26 NOTE — TOC Progression Note (Signed)
 Transition of Care [facility]) - Progression Note    Patient Details  Name: Rhonda Palmer MRN: [id number] Date of Birth: 03-May-1970  Transition of Care [facility] CM/SW Contact  Corean ONEIDA Haddock, RN Phone Number: 08/26/2023, 2:53 PM  Clinical Narrative:      Therapy recommending home health and RW.  Patient declines HH  States she already has a RW Patient states that her DND caregiver named Cheryle will pick her up at discharge                    Expected Discharge Plan and Services                                               Social Drivers of Health (SDOH) Interventions SDOH Screenings   Food Insecurity: No Food Insecurity (08/19/2023)  Housing: Low Risk  (08/19/2023)  Transportation Needs: No Transportation Needs (08/19/2023)  Utilities: Not At Risk (08/19/2023)  Alcohol Screen: Low Risk  (11/24/2022)  Depression (PHQ2-9): Low Risk  (11/24/2022)  Financial Resource Strain: Low Risk  (02/26/2023)   Received from [facility]  Social Connections: Socially Isolated (05/13/2023)  Tobacco Use: Low Risk  (08/20/2023)    Readmission Risk Interventions    08/26/2023    2:52 PM 01/13/2022    9:50 AM 01/07/2022    2:24 PM  Readmission Risk Prevention Plan  Transportation Screening Complete Complete   HRI or Home Care Consult  Complete --  Social Work Consult for Recovery Care Planning/Counseling  Complete Complete  Palliative Care Screening Not Applicable Not Applicable Not Applicable  Medication Review Oceanographer) Complete Complete Complete

## 2023-08-27 DIAGNOSIS — K922 Gastrointestinal hemorrhage, unspecified: Secondary | ICD-10-CM | POA: Diagnosis not present

## 2023-08-27 DIAGNOSIS — R1031 Right lower quadrant pain: Secondary | ICD-10-CM

## 2023-08-27 DIAGNOSIS — R1084 Generalized abdominal pain: Secondary | ICD-10-CM | POA: Diagnosis not present

## 2023-08-27 MED ORDER — PREGABALIN 100 MG PO CAPS: 100.0000 mg | ORAL_CAPSULE | Freq: Two times a day (BID) | ORAL | 0 refills | Status: AC

## 2023-08-27 MED ORDER — FLEET ENEMA RE ENEM
1.0000 | ENEMA | Freq: Once | RECTAL | Status: AC
  Administered 2023-08-27: 1 via RECTAL

## 2023-08-27 MED ORDER — POLYETHYLENE GLYCOL 3350 17 G PO PACK
17.0000 g | PACK | Freq: Two times a day (BID) | ORAL | 0 refills | Status: AC | PRN
Start: 2023-08-27 — End: ?

## 2023-08-27 MED ORDER — POLYETHYLENE GLYCOL 3350 17 G PO PACK: 17.0000 g | PACK | Freq: Two times a day (BID) | ORAL | Status: DC | PRN

## 2023-08-27 MED ORDER — OXYCODONE HCL 5 MG PO TABS: 5.0000 mg | ORAL_TABLET | ORAL | 0 refills | Status: AC | PRN

## 2023-08-27 MED ORDER — METHOCARBAMOL 750 MG PO TABS: 750.0000 mg | ORAL_TABLET | Freq: Three times a day (TID) | ORAL | 0 refills | Status: DC | PRN

## 2023-08-27 MED ORDER — DICLOFENAC SODIUM 1 % EX GEL: 4.0000 g | Freq: Four times a day (QID) | CUTANEOUS | 1 refills | Status: AC

## 2023-08-27 MED ORDER — BISACODYL 10 MG RE SUPP: 10.0000 mg | RECTAL | 0 refills | Status: AC | PRN

## 2023-08-27 NOTE — Progress Notes (Signed)
 Mobility Specialist - Progress Note   08/27/23 0934  Mobility  Activity Ambulated with assistance  Level of Assistance Standby assist, set-up cues, supervision of patient - no hands on  Assistive Device None  Distance Ambulated (ft) 160 ft  Activity Response Tolerated well  Mobility visit 1 Mobility  Mobility Specialist Start Time (ACUTE ONLY) [id number]  Mobility Specialist Stop Time (ACUTE ONLY) 0932  Mobility Specialist Time Calculation (min) (ACUTE ONLY) 6 min   Pt semi fowler upon entry, utilizing RA. Pt motivated and agreeable to OOB amb this date-- endorsing some pain. Pt completed bed mob indep, STS and amb one lap around the NS SBA no AD--- no railing utilized or HHA required this date. During amb Pt presented with a limp, expressing LE soreness. Pt returned to the room, amb to/from the restroom and left standing near the sink. RN notified.  America Silvan Mobility Specialist 08/27/23 9:38 AM

## 2023-08-27 NOTE — Discharge Summary (Signed)
 Physician Discharge Summary   Patient: Rhonda Palmer MRN: [id number] DOB: 1970-10-01  Admit date:     08/19/2023  Discharge date: 08/27/23  Discharge Physician: Amaryllis Dare   PCP: Lorel Maxie LABOR, MD   Recommendations at discharge:  Please obtain CBC and BMP on follow-up Follow-up with primary care provider Follow-up with gastroenterology Patient should avoid constipation especially when she had taking pain medications.  Discharge Diagnoses: Principal Problem:   GI bleed Active Problems:   Abdominal pain   Lower GI bleed   Right lower quadrant pain   Rectal bleeding   RLQ abdominal pain   Hospital Course: 53 y.o. female with medical history significant of TIA on Plavix , IIDM, hypothyroidism, anxiety/depression, chronic back pain, chronic pain syndrome presented with sudden onset of abdominal pain and lower GI bleed. Multiple imaging studies and colonoscopy unrevealing for intra-abdominal source.  GI signed off 7/26.   Pelvic ultrasound negative for acute GYN issues.  CT scan was without any acute abnormality, MRCP with no acute biliary ductal dilatation to suggest any obstructive stone.  No evidence of nephrolithiasis.  Patient did had some intra and extrahepatic biliary ductal dilatation of unknown significance as there was no transaminitis or obstructing stones.  She can follow-up with outpatient gastroenterology if needed.  MRI of right femur with a very small ring-enhancing lesion on the periphery of right gluteus maximus, unclear etiology and PCP can follow-up.  Her Lyrica  was increased to 100 mg twice daily and she will continue Robaxin  and also given some doses of oxycodone  to use as needed.  Patient needed close follow-up with PCP for further assistance.  Patient also has an history of chronic constipation likely related to her pain medication use, she was given MiraLAX  and Dulcolax to use as needed with instruction to keep herself well-hydrated and avoid constipation as  it can cause hemorrhoidal bleeding.  Patient will continue the rest of her home medications and need to have a close follow-up with her providers for further assistance.   Pain control - [place]  Controlled Substance Reporting System database was reviewed. and patient was instructed, not to drive, operate heavy machinery, perform activities at heights, swimming or participation in water activities or provide baby-sitting services while on Pain, Sleep and Anxiety Medications; until their outpatient Physician has advised to do so again. Also recommended to not to take more than prescribed Pain, Sleep and Anxiety Medications.  Consultants: Gastroenterology Procedures performed: Colonoscopy Disposition: Home Diet recommendation:  Discharge Diet Orders (From admission, onward)     Start     Ordered   08/27/23 0000  Diet - low sodium heart healthy        08/27/23 1104           Cardiac and Carb modified diet DISCHARGE MEDICATION: Allergies as of 08/27/2023       Reactions   Codeine Hives, Itching, Swelling, Rash   Takes oxycodone , morphine     Sulfa Antibiotics Anaphylaxis   Penicillins Itching   TOLERATED CEFAZOLIN    Aspirin  Rash        Medication List     STOP taking these medications    lisinopril  20 MG tablet Commonly known as: ZESTRIL    meclizine  25 MG tablet Commonly known as: ANTIVERT    traMADol  50 MG tablet Commonly known as: ULTRAM        TAKE these medications    Aristada  1064 MG/3.9ML prefilled syringe Generic drug: ARIPiprazole  Lauroxil ER Inject 3.9 mLs into the muscle as directed. Every 2 months  atorvastatin  20 MG tablet Commonly known as: LIPITOR  Take 1 tablet (20 mg total) by mouth daily.   bisacodyl  10 MG suppository Commonly known as: DULCOLAX Place 1 suppository (10 mg total) rectally as needed for moderate constipation.   chlorhexidine 0.12 % solution Commonly known as: PERIDEX Use as directed 5 mLs in the mouth or throat as  needed. Use as directed 5 mLs in the mouth or throat as needed.   clonazePAM  0.5 MG tablet Commonly known as: KLONOPIN  Take 0.5 mg by mouth daily.   clopidogrel  75 MG tablet Commonly known as: PLAVIX  Take 1 tablet (75 mg total) by mouth daily.   diclofenac  Sodium 1 % Gel Commonly known as: VOLTAREN  Apply 4 g topically 4 (four) times daily.   lamoTRIgine  200 MG tablet Commonly known as: LAMICTAL  Take 200 mg by mouth daily.   levothyroxine  200 MCG tablet Commonly known as: SYNTHROID  Take 200 mcg by mouth daily before breakfast.   methocarbamol  750 MG tablet Commonly known as: ROBAXIN  Take 1 tablet (750 mg total) by mouth every 8 (eight) hours as needed for muscle spasms.   ondansetron  4 MG tablet Commonly known as: ZOFRAN  Take 4 mg by mouth every 6 (six) hours as needed.   oxyCODONE  5 MG immediate release tablet Commonly known as: Oxy IR/ROXICODONE  Take 1 tablet (5 mg total) by mouth every 4 (four) hours as needed for severe pain (pain score 7-10).   Ozempic (1 MG/DOSE) 4 MG/3ML Sopn Generic drug: Semaglutide (1 MG/DOSE) Inject 1 mg into the skin once a week.   polyethylene glycol 17 g packet Commonly known as: MIRALAX  / GLYCOLAX  Take 17 g by mouth 2 (two) times daily as needed for moderate constipation or severe constipation.   pregabalin  100 MG capsule Commonly known as: LYRICA  Take 1 capsule (100 mg total) by mouth 2 (two) times daily.   propranolol  40 MG tablet Commonly known as: INDERAL  Take 40 mg by mouth 2 (two) times daily.   QUEtiapine  300 MG tablet Commonly known as: SEROQUEL  Take 600 mg by mouth at bedtime. What changed: Another medication with the same name was removed. Continue taking this medication, and follow the directions you see here.   torsemide  20 MG tablet Commonly known as: DEMADEX  Take 1 tablet (20 mg total) by mouth daily.        Discharge Exam: Filed Weights   08/19/23 0451 08/20/23 1339  Weight: 65.8 kg 65.8 kg   General.   Well-developed lady, in no acute distress. Pulmonary.  Lungs clear bilaterally, normal respiratory effort. CV.  Regular rate and rhythm, no JVD, rub or murmur. Abdomen.  Soft, nontender, nondistended, BS positive. CNS.  Alert and oriented .  No focal neurologic deficit. Extremities.  No edema, no cyanosis, pulses intact and symmetrical. Psychiatry.  Judgment and insight appears normal.   Condition at discharge: stable  The results of significant diagnostics from this hospitalization (including imaging, microbiology, ancillary and laboratory) are listed below for reference.   Imaging Studies: MR FEMUR RIGHT W WO CONTRAST Result Date: 08/25/2023 CLINICAL DATA:  Upper leg pain, stress fracture suspected, neg xray EXAM: MRI OF THE RIGHT FEMUR WITHOUT AND WITH CONTRAST TECHNIQUE: Multiplanar, multisequence MR imaging of the right femur was performed both before and after administration of intravenous contrast. CONTRAST:  6mL GADAVIST  GADOBUTROL  1 MMOL/ML IV SOLN COMPARISON:  None Available. FINDINGS: Bones/Joint/Cartilage No fracture or dislocation. Normal alignment. No joint effusion. No marrow signal abnormality. Ligaments Collateral ligaments are intact.  ACL and PCL are intact. Muscles  and Tendons Hamstring tendon origins and gluteal cuff insertions are intact. Quadriceps and patellar tendons are intact. Muscles are normal. Soft tissue Mild subcutaneous edema along the right lateral hip. Partially visualized 7 x 8 mm focus of T2 hyperintensity in the peripheral margin of the right gluteus maximus muscle demonstrating peripheral enhancement (series 20 image 1 and series 14, image 1), this is nonspecific and could reflect small intramuscular abscess, hematoma, or additional cystic lesion. The morphology of the sciatic nerve is normal in there is no perineural fat inflammation. IMPRESSION: 1. No acute osseous abnormality. 2. Partially visualized 7 x 8 mm focus of rim enhancing fluid signal in the periphery  of the right gluteus maximus muscle. This is nonspecific and could reflect a small intramuscular abscess, hematoma, or additional cystic lesion. Clinical correlation is recommended. Short-term follow-up MRI could be considered in 6-8 weeks if clinically warranted. Electronically Signed   By: Harrietta Sherry M.D.   On: 08/25/2023 08:33   US  PELVIC COMPLETE WITH TRANSVAGINAL Result Date: 08/22/2023 CLINICAL DATA:  Right-sided pain. EXAM: TRANSABDOMINAL AND TRANSVAGINAL ULTRASOUND OF PELVIS TECHNIQUE: Both transabdominal and transvaginal ultrasound examinations of the pelvis were performed. Transabdominal technique was performed for global imaging of the pelvis including uterus, ovaries, adnexal regions, and pelvic cul-de-sac. It was necessary to proceed with endovaginal exam following the transabdominal exam to visualize the ovaries. COMPARISON:  CT and MRI 08/21/2023. FINDINGS: Uterus Measurements: Surgically absent. Endometrium Thickness: N/A. Right ovary Measurements: Not visualized, likely due to overlying bowel gas. No discernible adnexal mass. Left ovary Measurements: Not visualized, likely due to overlying bowel gas. No discernible adnexal mass. Other findings Small volume intraperitoneal free fluid in the pelvis, similar to CT scan yesterday. IMPRESSION: 1. Status post hysterectomy. 2. Nonvisualization of the ovaries. 3. Small volume intraperitoneal free fluid in the pelvis, similar to CT scan yesterday. Electronically Signed   By: Camellia Candle M.D.   On: 08/22/2023 13:43   MR ABDOMEN MRCP W WO CONTAST Result Date: 08/21/2023 CLINICAL DATA:  Right lower quadrant abdominal pain. Biliary dilatation. EXAM: MRI ABDOMEN WITHOUT AND WITH CONTRAST (INCLUDING MRCP) TECHNIQUE: Multiplanar multisequence MR imaging of the abdomen was performed both before and after the administration of intravenous contrast. Heavily T2-weighted images of the biliary and pancreatic ducts were obtained, and three-dimensional MRCP  images were rendered by post processing. CONTRAST:  6mL GADAVIST  GADOBUTROL  1 MMOL/ML IV SOLN COMPARISON:  CT scan 08/21/2023 FINDINGS: Lower chest: Trace right pleural effusion with dependent atelectasis in both lower lobes. Hepatobiliary: Cholecystectomy. Dilated cystic duct remnant and mild prominence of intrahepatic biliary and extrahepatic bile ducts, with common hepatic duct measuring 1.7 cm in diameter; proximal common bile duct measuring 1.6 cm in diameter; mid common bile duct measuring 1.1 cm in diameter; and distal common bile duct 1.0 cm in diameter. Very minimally blunted distal margin of the CBD without filling defect observed. Or differential enhancement at the ampulla. Comparing back to December 2023, the common bile duct diameters are comparable, and this is felt to represent a chronic condition potentially at least partially due to physiologic dilatation related to cholecystectomy No significant focal parenchymal hepatic lesion is observed. Pancreas:  Upper normal sized dorsal pancreatic duct. Spleen: 6 mm cyst inferiorly in the spleen, highly likely to be benign and incidental. Adrenals/Urinary Tract: Right hydronephrosis with dilated renal pelvis and mild proximal right hydroureter. Borderline prominent left renal pelvis without left hydroureter. Adrenal glands unremarkable. On image 12 series 16 there is a suggestion of a 5 mm filling defect  in the right lower renal pelvis, but no stone is visible in this vicinity on today or prior exams and there is no enhancement in this vicinity, an/suspect that this is a flow artifact. Stomach/Bowel: Gastric bypass. Substantial prominence of gas and stool in the colon, possibly physiologic dilatation. Dilated gas-filled sigmoid colon, today's MRI is of the upper abdomen and does not assess the pelvis for entities such as sigmoid volvulus. Vascular/Lymphatic:  Unremarkable Other:  Mild ascites.  Mild mesenteric and subcutaneous edema. Musculoskeletal:  Degenerative disc disease and loss of disc height at the L5-S1 level. IMPRESSION: 1. Right hydronephrosis with dilated renal pelvis and mild proximal right hydroureter. Borderline prominent left renal pelvis without left hydroureter. 2. Cholecystectomy. Chronic biliary prominence not changed from 2023, at least partially due to a physiologic effective cholecystectomy. No filling defect in the biliary tree or obstructing lesion in the vicinity of the ampulla identified. 3. Trace right pleural effusion with dependent atelectasis in both lower lobes. 4. Mild ascites. Mild mesenteric and subcutaneous edema. 5. Substantial prominence of gas and stool in the colon, possibly physiologic dilatation. 6. Degenerative disc disease and loss of disc height at the L5-S1 level. Electronically Signed   By: Ryan Salvage M.D.   On: 08/21/2023 20:22   MR 3D Recon At Scanner Result Date: 08/21/2023 CLINICAL DATA:  Right lower quadrant abdominal pain. Biliary dilatation. EXAM: MRI ABDOMEN WITHOUT AND WITH CONTRAST (INCLUDING MRCP) TECHNIQUE: Multiplanar multisequence MR imaging of the abdomen was performed both before and after the administration of intravenous contrast. Heavily T2-weighted images of the biliary and pancreatic ducts were obtained, and three-dimensional MRCP images were rendered by post processing. CONTRAST:  6mL GADAVIST  GADOBUTROL  1 MMOL/ML IV SOLN COMPARISON:  CT scan 08/21/2023 FINDINGS: Lower chest: Trace right pleural effusion with dependent atelectasis in both lower lobes. Hepatobiliary: Cholecystectomy. Dilated cystic duct remnant and mild prominence of intrahepatic biliary and extrahepatic bile ducts, with common hepatic duct measuring 1.7 cm in diameter; proximal common bile duct measuring 1.6 cm in diameter; mid common bile duct measuring 1.1 cm in diameter; and distal common bile duct 1.0 cm in diameter. Very minimally blunted distal margin of the CBD without filling defect observed. Or differential  enhancement at the ampulla. Comparing back to December 2023, the common bile duct diameters are comparable, and this is felt to represent a chronic condition potentially at least partially due to physiologic dilatation related to cholecystectomy No significant focal parenchymal hepatic lesion is observed. Pancreas:  Upper normal sized dorsal pancreatic duct. Spleen: 6 mm cyst inferiorly in the spleen, highly likely to be benign and incidental. Adrenals/Urinary Tract: Right hydronephrosis with dilated renal pelvis and mild proximal right hydroureter. Borderline prominent left renal pelvis without left hydroureter. Adrenal glands unremarkable. On image 12 series 16 there is a suggestion of a 5 mm filling defect in the right lower renal pelvis, but no stone is visible in this vicinity on today or prior exams and there is no enhancement in this vicinity, an/suspect that this is a flow artifact. Stomach/Bowel: Gastric bypass. Substantial prominence of gas and stool in the colon, possibly physiologic dilatation. Dilated gas-filled sigmoid colon, today's MRI is of the upper abdomen and does not assess the pelvis for entities such as sigmoid volvulus. Vascular/Lymphatic:  Unremarkable Other:  Mild ascites.  Mild mesenteric and subcutaneous edema. Musculoskeletal: Degenerative disc disease and loss of disc height at the L5-S1 level. IMPRESSION: 1. Right hydronephrosis with dilated renal pelvis and mild proximal right hydroureter. Borderline prominent left renal pelvis  without left hydroureter. 2. Cholecystectomy. Chronic biliary prominence not changed from 2023, at least partially due to a physiologic effective cholecystectomy. No filling defect in the biliary tree or obstructing lesion in the vicinity of the ampulla identified. 3. Trace right pleural effusion with dependent atelectasis in both lower lobes. 4. Mild ascites. Mild mesenteric and subcutaneous edema. 5. Substantial prominence of gas and stool in the colon,  possibly physiologic dilatation. 6. Degenerative disc disease and loss of disc height at the L5-S1 level. Electronically Signed   By: Ryan Salvage M.D.   On: 08/21/2023 20:22   CT ABDOMEN PELVIS WO CONTRAST Result Date: 08/21/2023 CLINICAL DATA:  Right lower quadrant abdominal pain. EXAM: CT ABDOMEN AND PELVIS WITHOUT CONTRAST TECHNIQUE: Multidetector CT imaging of the abdomen and pelvis was performed following the standard protocol without IV contrast. RADIATION DOSE REDUCTION: This exam was performed according to the departmental dose-optimization program which includes automated exposure control, adjustment of the mA and/or kV according to patient size and/or use of iterative reconstruction technique. COMPARISON:  CT abdomen pelvis dated 08/19/2023. FINDINGS: Evaluation of this exam is limited in the absence of intravenous contrast. Lower chest: Small bilateral pleural effusions. No intra-abdominal free air.  Small free fluid in the pelvis. Hepatobiliary: The liver is unremarkable. Cholecystectomy. Dilated common bile duct measuring approximately 2 cm in diameter. No retained calcified stone noted in the central CBD Pancreas: The pancreas is unremarkable as visualized Spleen: Normal in size without focal abnormality. Adrenals/Urinary Tract: The adrenal glands unremarkable. Bilateral extrarenal pelvis with mild pelviectasis increased since the prior CT. No renal calculi. The visualized ureters and urinary bladder appear unremarkable. Stomach/Bowel: There is postsurgical changes of the bowel. There is no bowel obstruction. The appendix is normal. Vascular/Lymphatic: The abdominal aorta and IVC are unremarkable. No portal venous gas. There is no adenopathy. Reproductive: Hysterectomy.  No suspicious adnexal masses. Other: Midline anterior pelvic wall incisional scar. Musculoskeletal: No acute osseous pathology. IMPRESSION: 1. No acute intra-abdominal or pelvic pathology. No bowel obstruction. Normal  appendix. 2. Small bilateral pleural effusions. Electronically Signed   By: Vanetta Chou M.D.   On: 08/21/2023 14:58   CT ABDOMEN PELVIS W CONTRAST Result Date: 08/19/2023 CLINICAL DATA:  Right lower quadrant abdominal pain.  Nausea. EXAM: CT ABDOMEN AND PELVIS WITH CONTRAST TECHNIQUE: Multidetector CT imaging of the abdomen and pelvis was performed using the standard protocol following bolus administration of intravenous contrast. RADIATION DOSE REDUCTION: This exam was performed according to the departmental dose-optimization program which includes automated exposure control, adjustment of the mA and/or kV according to patient size and/or use of iterative reconstruction technique. CONTRAST:  OMNIPAQUE  IOHEXOL  300 MG/ML  SOLN COMPARISON:  04/01/2022 FINDINGS: Lower chest: Dependent atelectasis. Hepatobiliary: No suspicious focal abnormality within the liver parenchyma. Mild intrahepatic biliary duct dilatation. Common bile duct in the head of the pancreas measures up to 10 mm diameter, similar to prior. Cholecystectomy. Pancreas: No focal mass lesion. No dilatation of the main duct. No intraparenchymal cyst. No peripancreatic edema. Spleen: No splenomegaly. No suspicious focal mass lesion. Adrenals/Urinary Tract: No adrenal nodule or mass. Kidneys unremarkable. No evidence for hydroureter. The urinary bladder appears normal for the degree of distention. Stomach/Bowel: Surgical changes are noted in the stomach. Duodenum is normally positioned as is the ligament of Treitz. No small bowel wall thickening. No small bowel dilatation. Small bowel anastomosis noted left abdomen. The terminal ileum is normal. Cecum is identified in the anterior midline abdomen. Colon is diffusely dilated with gas and stool measuring  up to 7.3 cm diameter. Sigmoid colon is distended with gas and stool down to the level of the rectum. No obstructing mass lesion evident. Vascular/Lymphatic: No abdominal aortic aneurysm. No  abdominal aortic atherosclerotic calcification. There is no gastrohepatic or hepatoduodenal ligament lymphadenopathy. No retroperitoneal or mesenteric lymphadenopathy. No pelvic sidewall lymphadenopathy. Reproductive: Hysterectomy.  There is no adnexal mass. Other: No intraperitoneal free fluid. Musculoskeletal: No worrisome lytic or sclerotic osseous abnormality. Multiple soft tissue nodules in the sub cutaneous fat of the gluteal regions are likely injection granulomata. IMPRESSION: 1. Diffuse dilatation of the colon with gas and stool measuring up to 7.3 cm diameter. Sigmoid colon is distended with gas and stool down to the level of the rectum. No obstructing mass lesion evident. Imaging features are compatible with colonic ileus/dysmotility. Functional obstruction not excluded. 2. Mild intrahepatic and extrahepatic biliary duct dilatation. Intrahepatic biliary duct dilatation is progressive in the interval although extrahepatic common bile duct dilatation is similar. Correlation with liver function test recommended. Electronically Signed   By: Camellia Candle M.D.   On: 08/19/2023 06:35    Microbiology: Results for orders placed or performed during the hospital encounter of 05/13/23  Urine Culture     Status: Abnormal   Collection Time: 05/13/23 11:47 AM   Specimen: Urine, Clean Catch  Result Value Ref Range Status   Specimen Description   Final    URINE, CLEAN CATCH Performed at [facility], [address] [place], [place] [address]    Special Requests   Final    NONE Performed at [facility], [address] [place], [place] [address]    Culture (A)  Final    20,000 COLONIES/mL LACTOBACILLUS SPECIES Standardized susceptibility testing for this organism is not available. Performed at [facility], [address] [place], [place] [address]    Report Status 05/14/2023 FINAL  Final    Labs: CBC: Recent Labs  Lab 08/22/23 1011 08/24/23 0850  WBC 5.5 4.5   NEUTROABS 2.3 1.6*  HGB 11.4* 11.7*  HCT 36.2 37.6  MCV 92.1 91.0  PLT 203 202   Basic Metabolic Panel: Recent Labs  Lab 08/22/23 1011 08/24/23 0850  NA 140 142  K 3.5 3.8  CL 108 106  CO2 26 29  GLUCOSE 103* 109*  BUN 6 8  CREATININE 0.50 0.61  CALCIUM  8.8* 9.0   Liver Function Tests: Recent Labs  Lab 08/21/23 1619  AST 30  ALT 23  ALKPHOS 113  BILITOT 0.6  PROT 5.3*  ALBUMIN  2.7*   CBG: No results for input(s): GLUCAP in the last 168 hours.  Discharge time spent: greater than 30 minutes.  This record has been created using Conservation officer, historic buildings. Errors have been sought and corrected,but may not always be located. Such creation errors do not reflect on the standard of care.   Signed: Amaryllis Dare, MD [facility] 08/27/2023

## 2023-09-13 ENCOUNTER — Emergency Department
Admission: EM | Admit: 2023-09-13 | Discharge: 2023-09-13 | Disposition: A | Discharge status: Home / Self Care | Payer: MEDICAID | Attending: Emergency Medicine | Admitting: Emergency Medicine

## 2023-09-13 ENCOUNTER — Emergency Department: Payer: MEDICAID

## 2023-09-13 ENCOUNTER — Other Ambulatory Visit: Payer: Self-pay

## 2023-09-13 DIAGNOSIS — R1032 Left lower quadrant pain: Secondary | ICD-10-CM | POA: Diagnosis present

## 2023-09-13 DIAGNOSIS — L989 Disorder of the skin and subcutaneous tissue, unspecified: Secondary | ICD-10-CM | POA: Diagnosis not present

## 2023-09-13 DIAGNOSIS — Z8673 Personal history of transient ischemic attack (TIA), and cerebral infarction without residual deficits: Secondary | ICD-10-CM | POA: Diagnosis not present

## 2023-09-13 DIAGNOSIS — E039 Hypothyroidism, unspecified: Secondary | ICD-10-CM | POA: Diagnosis not present

## 2023-09-13 DIAGNOSIS — K5900 Constipation, unspecified: Secondary | ICD-10-CM | POA: Diagnosis not present

## 2023-09-13 LAB — COMPREHENSIVE METABOLIC PANEL WITH GFR
ALT: 20 U/L (ref 0–44)
AST: 24 U/L (ref 15–41)
Albumin: 3.1 g/dL — ABNORMAL LOW (ref 3.5–5.0)
Alkaline Phosphatase: 98 U/L (ref 38–126)
Anion gap: 8 (ref 5–15)
BUN: 12 mg/dL (ref 6–20)
CO2: 25 mmol/L (ref 22–32)
Calcium: 9.2 mg/dL (ref 8.9–10.3)
Chloride: 107 mmol/L (ref 98–111)
Creatinine, Ser: 0.83 mg/dL (ref 0.44–1.00)
GFR, Estimated: >60 mL/min (ref >60)
Glucose, Bld: 118 mg/dL — ABNORMAL HIGH (ref 70–99)
Potassium: 4 mmol/L (ref 3.5–5.1)
Sodium: 140 mmol/L (ref 135–145)
Total Bilirubin: 0.7 mg/dL (ref 0.0–1.2)
Total Protein: 6.3 g/dL — ABNORMAL LOW (ref 6.5–8.1)

## 2023-09-13 LAB — CBC
HCT: 37.9 % (ref 36.0–46.0)
Hemoglobin: 12.2 g/dL (ref 12.0–15.0)
MCH: 28.4 pg (ref 26.0–34.0)
MCHC: 32.2 g/dL (ref 30.0–36.0)
MCV: 88.3 fL (ref 80.0–100.0)
Platelets: 263 K/uL (ref 150–400)
RBC: 4.29 MIL/uL (ref 3.87–5.11)
RDW: 13.2 % (ref 11.5–15.5)
WBC: 5.9 K/uL (ref 4.0–10.5)
nRBC: 0 % (ref 0.0–0.2)

## 2023-09-13 LAB — URINALYSIS, ROUTINE W REFLEX MICROSCOPIC
Bilirubin Urine: NEGATIVE
Glucose, UA: NEGATIVE mg/dL
Hgb urine dipstick: NEGATIVE
Ketones, ur: NEGATIVE mg/dL
Nitrite: NEGATIVE
Protein, ur: NEGATIVE mg/dL
Specific Gravity, Urine: 1.013 (ref 1.005–1.030)
pH: 7 (ref 5.0–8.0)

## 2023-09-13 LAB — LIPASE, BLOOD: Lipase: 32 U/L (ref 11–51)

## 2023-09-13 MED ORDER — ACETAMINOPHEN 500 MG PO TABS
1000.0000 mg | ORAL_TABLET | Freq: Once | ORAL | Status: AC
  Administered 2023-09-13: 1000 mg via ORAL
  Filled 2023-09-13: qty 2

## 2023-09-13 MED ORDER — ONDANSETRON HCL 4 MG/2ML IJ SOLN
4.0000 mg | Freq: Once | INTRAMUSCULAR | Status: AC
  Administered 2023-09-13: 4 mg via INTRAVENOUS
  Filled 2023-09-13: qty 2

## 2023-09-13 MED ORDER — SODIUM CHLORIDE 0.9 % IV BOLUS
500.0000 mL | Freq: Once | INTRAVENOUS | Status: AC
  Administered 2023-09-13: 500 mL via INTRAVENOUS

## 2023-09-13 MED ORDER — KETOROLAC TROMETHAMINE 15 MG/ML IJ SOLN
15.0000 mg | Freq: Once | INTRAMUSCULAR | Status: AC
  Administered 2023-09-13: 15 mg via INTRAVENOUS
  Filled 2023-09-13: qty 1

## 2023-09-13 MED ORDER — MORPHINE SULFATE (PF) 4 MG/ML IV SOLN
4.0000 mg | Freq: Once | INTRAVENOUS | Status: AC
  Administered 2023-09-13: 4 mg via INTRAVENOUS
  Filled 2023-09-13: qty 1

## 2023-09-13 MED ORDER — IOHEXOL 300 MG/ML  SOLN
80.0000 mL | Freq: Once | INTRAMUSCULAR | Status: AC | PRN
  Administered 2023-09-13: 80 mL via INTRAVENOUS

## 2023-09-13 NOTE — ED Triage Notes (Signed)
 Pt BIB EMS with c/o ABD pain. Pt reports a knot of the left side of her ABD pain that is causing a sharp pain in her ABD. Pt also reports her right sided ABD pain from her last admission is still present. Pt reports n/v.

## 2023-09-13 NOTE — ED Provider Notes (Signed)
 [facility] Provider Note    Event Date/Time   First MD Initiated Contact with Patient 09/13/23 1311     (approximate)   History   Abdominal Pain   HPI  Jaydy Fitzhenry is a 53 y.o. female past medical history significant for prior TIA on Plavix , hypothyroidism, anxiety/depression, chronic back pain, chronic pain syndrome, history of gastric bypass, who presents to the emergency department with abdominal pain.  Patient states that she was recently admitted to the hospital for abdominal pain and was diagnosed with an ileus.  States that she has a history of constipation.  States that she had worsening pain to her left lower abdomen that has been progressively worsening over the past couple of days.  Does state that she is having bowel movements and passing flatus.  Endorses nausea but no episodes of vomiting.  Felt like there was a mass or a bump to the left side of her abdomen.  Weight loss that has been intentional with gastric bypass.  Denies any blood in her stool or melena.     Physical Exam   Triage Vital Signs: ED Triage Vitals  Encounter Vitals Group     BP 09/13/23 1222 125/88     Girls Systolic BP Percentile --      Girls Diastolic BP Percentile --      Boys Systolic BP Percentile --      Boys Diastolic BP Percentile --      Pulse Rate 09/13/23 1222 77     Resp 09/13/23 1222 15     Temp 09/13/23 1222 98.5 F (36.9 C)     Temp src --      SpO2 09/13/23 1222 100 %     Weight 09/13/23 1223 150 lb (68 kg)     Height 09/13/23 1223 5' (1.524 m)     Head Circumference --      Peak Flow --      Pain Score 09/13/23 1223 7     Pain Loc --      Pain Education --      Exclude from Growth Chart --     Most recent vital signs: Vitals:   09/13/23 1222 09/13/23 1552  BP: 125/88 129/89  Pulse: 77 80  Resp: 15 16  Temp: 98.5 F (36.9 C) 98.2 F (36.8 C)  SpO2: 100% 100%    Physical Exam Exam conducted with a chaperone present.   Constitutional:      Appearance: She is well-developed.  HENT:     Head: Atraumatic.  Eyes:     Conjunctiva/sclera: Conjunctivae normal.  Cardiovascular:     Rate and Rhythm: Regular rhythm.  Pulmonary:     Effort: No respiratory distress.  Abdominal:     General: There is no distension.     Tenderness: There is abdominal tenderness in the left lower quadrant.     Comments: Superficial palpable lesion to the left abdominal wall.  Mobile and mildly tender.  No purulence and no fluctuance  Genitourinary:    Comments: Rectal exam no impaction, no gross blood or melena Musculoskeletal:        General: Normal range of motion.     Cervical back: Normal range of motion.  Skin:    General: Skin is warm.  Neurological:     Mental Status: She is alert. Mental status is at baseline.     IMPRESSION / MDM / ASSESSMENT AND PLAN / ED COURSE  I reviewed the triage vital  signs and the nursing notes.  Differential diagnosis including constipation, bowel obstruction, intra-abdominal abscess, diverticulitis, malignancy  Given IV fluids and Toradol   EKG  I, Clotilda Punter, the attending physician, personally viewed and interpreted this ECG.   Rate: Normal  Rhythm: Normal sinus  Axis: Normal  Intervals: Normal  ST&T Change: None.  Normal QTc  No tachycardic or bradycardic dysrhythmias while on cardiac telemetry.  RADIOLOGY CT scan abdomen and pelvis with no acute findings.  Bariatric surgery without complicating features.  Moderate stool throughout the colon with large volume stool in the rectus sigmoid colon.  Trace right pleural effusion.   LABS (all labs ordered are listed, but only abnormal results are displayed) Labs interpreted as -    Labs Reviewed  COMPREHENSIVE METABOLIC PANEL WITH GFR - Abnormal; Notable for the following components:      Result Value   Glucose, Bld 118 (*)    Total Protein 6.3 (*)    Albumin  3.1 (*)    All other components within normal limits   URINALYSIS, ROUTINE W REFLEX MICROSCOPIC - Abnormal; Notable for the following components:   Color, Urine YELLOW (*)    APPearance HAZY (*)    Leukocytes,Ua SMALL (*)    Bacteria, UA RARE (*)    All other components within normal limits  LIPASE, BLOOD  CBC     MDM  Reevaluation ongoing pain.  Was given IV morphine  and Tylenol .  Clinical picture is concerning for constipation.  No other acute pathology on CT scan.  Lab work overall reassuring.  No symptoms of a urinary tract infection, will not treat her urine.  No signs of infection.  Thyroid  studies recently checked in July and do not feel as they need to be repeated.  Discussed constipation treatment.  Discussed outpatient follow-up with primary care physician and gastroenterology.  Discussed return to the emergency department for any ongoing or worsening symptoms.  No questions at time of discharge.     PROCEDURES:  Critical Care performed: No  Procedures  Patient's presentation is most consistent with acute presentation with potential threat to life or bodily function.   MEDICATIONS ORDERED IN ED: Medications  sodium chloride  0.9 % bolus 500 mL (0 mLs Intravenous Stopped 09/13/23 1547)  ketorolac  (TORADOL ) 15 MG/ML injection 15 mg (15 mg Intravenous Given 09/13/23 1401)  iohexol  (OMNIPAQUE ) 300 MG/ML solution 80 mL (80 mLs Intravenous Contrast Given 09/13/23 1411)  acetaminophen  (TYLENOL ) tablet 1,000 mg (1,000 mg Oral Given 09/13/23 1548)  morphine  (PF) 4 MG/ML injection 4 mg (4 mg Intravenous Given 09/13/23 1550)  ondansetron  (ZOFRAN ) injection 4 mg (4 mg Intravenous Given 09/13/23 1548)    FINAL CLINICAL IMPRESSION(S) / ED DIAGNOSES   Final diagnoses:  Left lower quadrant abdominal pain  Constipation, unspecified constipation type     Rx / DC Orders   ED Discharge Orders     None        Note:  This document was prepared using Dragon voice recognition software and may include unintentional dictation  errors.   Punter Clotilda, MD 09/13/23 1655

## 2023-09-13 NOTE — Discharge Instructions (Addendum)
 You were seen in the emergency department for abdominal pain.  You had a CT scan that showed findings of constipation with a large stool burden.  Stay hydrated and drink plenty of fluids.  We discussed a MiraLAX  cleanout.  Call your primary care physician for close follow-up.  You are given information to follow-up with gastroenterology.  Return to the emergency department for any ongoing or worsening symptoms  Constipation management - take MiraLAX  (6 capfulls) mixed with 32-64 ounces of fluid.  Drink this entire drink.  If you do not have a bowel movement that day you can repeat the next day.  MiraLAX  does not work if you do not drink plenty of fluids with it. Once you have a good bowel movement, take 1 capfull of Miralax  daily until having regular bowel movements.  Pain control:  Ibuprofen  (motrin /aleve /advil ) - You can take 3 tablets (600 mg) every 6 hours as needed for pain/fever.  Acetaminophen  (tylenol ) - You can take 2 extra strength tablets (1000 mg) every 6 hours as needed for pain/fever.  You can alternate these medications or take them together.  Make sure you eat food/drink water when taking these medications.

## 2024-01-08 ENCOUNTER — Emergency Department (HOSPITAL_COMMUNITY): Payer: MEDICAID

## 2024-01-08 ENCOUNTER — Emergency Department (HOSPITAL_COMMUNITY)
Admission: EM | Admit: 2024-01-08 | Discharge: 2024-01-08 | Disposition: A | Discharge status: Home / Self Care | Payer: MEDICAID | Attending: Emergency Medicine | Admitting: Emergency Medicine

## 2024-01-08 ENCOUNTER — Encounter (HOSPITAL_COMMUNITY): Payer: Self-pay

## 2024-01-08 DIAGNOSIS — S0081XA Abrasion of other part of head, initial encounter: Secondary | ICD-10-CM | POA: Insufficient documentation

## 2024-01-08 DIAGNOSIS — E119 Type 2 diabetes mellitus without complications: Secondary | ICD-10-CM | POA: Insufficient documentation

## 2024-01-08 DIAGNOSIS — E039 Hypothyroidism, unspecified: Secondary | ICD-10-CM | POA: Insufficient documentation

## 2024-01-08 DIAGNOSIS — W19XXXA Unspecified fall, initial encounter: Secondary | ICD-10-CM

## 2024-01-08 DIAGNOSIS — S76319A Strain of muscle, fascia and tendon of the posterior muscle group at thigh level, unspecified thigh, initial encounter: Secondary | ICD-10-CM | POA: Insufficient documentation

## 2024-01-08 DIAGNOSIS — W06XXXA Fall from bed, initial encounter: Secondary | ICD-10-CM | POA: Insufficient documentation

## 2024-01-08 DIAGNOSIS — Z79899 Other long term (current) drug therapy: Secondary | ICD-10-CM | POA: Insufficient documentation

## 2024-01-08 DIAGNOSIS — Z8673 Personal history of transient ischemic attack (TIA), and cerebral infarction without residual deficits: Secondary | ICD-10-CM | POA: Insufficient documentation

## 2024-01-08 LAB — CBC WITH DIFFERENTIAL/PLATELET
Abs Immature Granulocytes: 0.04 K/uL (ref 0.00–0.07)
Basophils Absolute: 0 K/uL (ref 0.0–0.1)
Basophils Relative: 1 %
Eosinophils Absolute: 0.1 K/uL (ref 0.0–0.5)
Eosinophils Relative: 2 %
HCT: 35.1 % — ABNORMAL LOW (ref 36.0–46.0)
Hemoglobin: 10.6 g/dL — ABNORMAL LOW (ref 12.0–15.0)
Immature Granulocytes: 1 %
Lymphocytes Relative: 28 %
Lymphs Abs: 2 K/uL (ref 0.7–4.0)
MCH: 28.7 pg (ref 26.0–34.0)
MCHC: 30.2 g/dL (ref 30.0–36.0)
MCV: 95.1 fL (ref 80.0–100.0)
Monocytes Absolute: 0.7 K/uL (ref 0.1–1.0)
Monocytes Relative: 9 %
Neutro Abs: 4.3 K/uL (ref 1.7–7.7)
Neutrophils Relative %: 59 %
Platelets: 176 K/uL (ref 150–400)
RBC: 3.69 MIL/uL — ABNORMAL LOW (ref 3.87–5.11)
RDW: 14.4 % (ref 11.5–15.5)
WBC: 7.1 K/uL (ref 4.0–10.5)
nRBC: 0 % (ref 0.0–0.2)

## 2024-01-08 LAB — COMPREHENSIVE METABOLIC PANEL WITH GFR
ALT: 193 U/L — ABNORMAL HIGH (ref 0–44)
AST: 513 U/L — ABNORMAL HIGH (ref 15–41)
Albumin: 2.9 g/dL — ABNORMAL LOW (ref 3.5–5.0)
Alkaline Phosphatase: 172 U/L — ABNORMAL HIGH (ref 38–126)
Anion gap: 5 (ref 5–15)
BUN: 12 mg/dL (ref 6–20)
CO2: 27 mmol/L (ref 22–32)
Calcium: 8.5 mg/dL — ABNORMAL LOW (ref 8.9–10.3)
Chloride: 105 mmol/L (ref 98–111)
Creatinine, Ser: 0.54 mg/dL (ref 0.44–1.00)
GFR, Estimated: >60 mL/min (ref >60)
Glucose, Bld: 182 mg/dL — ABNORMAL HIGH (ref 70–99)
Potassium: 4.2 mmol/L (ref 3.5–5.1)
Sodium: 137 mmol/L (ref 135–145)
Total Bilirubin: 0.9 mg/dL (ref 0.0–1.2)
Total Protein: 5.5 g/dL — ABNORMAL LOW (ref 6.5–8.1)

## 2024-01-08 LAB — CBG MONITORING, ED
Glucose-Capillary: 91 mg/dL (ref 70–99)
Glucose-Capillary: 178 mg/dL — ABNORMAL HIGH (ref 70–99)

## 2024-01-08 MED ORDER — NALOXONE HCL 4 MG/0.1ML NA LIQD
1.0000 | Freq: Once | NASAL | Status: DC
  Filled 2024-01-08: qty 4

## 2024-01-08 MED ORDER — OXYCODONE HCL 5 MG PO TABS
5.0000 mg | ORAL_TABLET | Freq: Once | ORAL | Status: AC
  Administered 2024-01-08: 5 mg via ORAL
  Filled 2024-01-08: qty 1

## 2024-01-08 MED ORDER — SENNOSIDES-DOCUSATE SODIUM 8.6-50 MG PO TABS: 1.0000 | ORAL_TABLET | Freq: Every evening | ORAL | 0 refills | Status: AC | PRN

## 2024-01-08 MED ORDER — OXYCODONE-ACETAMINOPHEN 5-325 MG PO TABS
1.0000 | ORAL_TABLET | Freq: Once | ORAL | Status: AC
  Administered 2024-01-08: 1 via ORAL
  Filled 2024-01-08: qty 1

## 2024-01-08 MED ORDER — METHOCARBAMOL 750 MG PO TABS: 750.0000 mg | ORAL_TABLET | Freq: Three times a day (TID) | ORAL | 0 refills | Status: AC | PRN

## 2024-01-08 MED ORDER — METHOCARBAMOL 500 MG PO TABS
500.0000 mg | ORAL_TABLET | Freq: Once | ORAL | Status: AC
  Administered 2024-01-08: 500 mg via ORAL
  Filled 2024-01-08: qty 1

## 2024-01-08 MED ORDER — OXYCODONE-ACETAMINOPHEN 5-325 MG PO TABS: 1.0000 | ORAL_TABLET | Freq: Four times a day (QID) | ORAL | 0 refills | Status: AC | PRN

## 2024-01-08 NOTE — ED Notes (Signed)
 Pt ambulated with walker to the bathroom. Did well but stated it was very painful to put weight on that right side.

## 2024-01-08 NOTE — Discharge Instructions (Addendum)
 There is no sign of broken bones or internal bleeding today.  It did appear that you injured your piriformis which is a muscle in your pelvis/buttocks area.  You can continue your tramadol  at home and use the muscle relaxer as needed.  An ice pack may also be helpful.  Use your walker if it is painful to put weight on your leg until it is feeling better but it will not hurt your leg to put weight on it because there is nothing broken.  Also your liver tests were elevated today which is unclear why.  Continue to avoid Tylenol  products and alcohol.  Make sure you are staying hydrated like you have been doing.  You do need to follow-up with your doctor in about a week to have those test rechecked.  If in the meantime you start having fever, vomiting, significant abdominal pain then you should return to the emergency room.

## 2024-01-08 NOTE — ED Notes (Addendum)
 Pt reports rolling out of bed and hitting head on corner of dresser, c/o pain to her head and right hip pain. Reported that she took seroquel  last night at 2100. Pt responds to voice, answers questions when asked but lethargic and nodding off in between conversation

## 2024-01-08 NOTE — ED Notes (Signed)
 PT sleepy, alert, NAD, calm, interactive, resps e/u, speaking in clear sluggish/ dry mouth speech, skin W&D. C/o HA and L hip pain. Denies other sx. Endorses hit head and LOC PTA.

## 2024-01-08 NOTE — ED Provider Triage Note (Signed)
 Emergency Medicine Provider Triage Evaluation Note  Rhonda Palmer , a 53 y.o. female  was evaluated in triage.  Pt complains of fall, struck her right hip and her head, she states she fell out of the bed this morning when she rolled over, landed on her right hip, bumped her head against the dresser, no loss of consciousness.  She was  able to eventually get herself to a crawling position where she crawled to a chair, got in the chair, paramedics were called to help transfer her to the hospital..  Review of Systems  Positive: Hip pain, headache, generalized weakness Negative: Blurred vision slurred speech  Physical Exam  BP 114/72 (BP Location: Left Arm)   Pulse 62   Temp 97.6 F (36.4 C) (Oral)   Resp 14   SpO2 100%  Gen:   Awake, no distress somnolent but easy to arouse, slight slurred speech, which improves rapidly when she opens her eyes and starts to talk Resp:  Normal effort, cardiac exam is unremarkable MSK:   The patient is obese, able to lift both heels off the bed by about 2 inches.  Symmetrical, she has tenderness with range of motion of the right hip, no tenderness with range of motion of the left leg or the bilateral upper extremities Other:  Mild sign of a contusion to the right forehead  Medical Decision Making  Medically screening exam initiated at 10:20 AM.  Appropriate orders placed.  Rhonda Palmer was informed that the remainder of the evaluation will be completed by another provider, this initial triage assessment does not replace that evaluation, and the importance of remaining in the ED until their evaluation is complete.  Rolled out of bed, more somnolent than I would suspect, CT of the head, x-ray of the hip, vitals normal   Rhonda Rogue, MD 01/08/24 1022

## 2024-01-08 NOTE — ED Provider Notes (Signed)
 Blood pressure 102/66, pulse 80, temperature 98.7 F (37.1 C), temperature source Oral, resp. rate 18, SpO2 99%.  Assuming care from Dr. Doretha.  In short, Rhonda Palmer is a 53 y.o. female with a chief complaint of Fall .  Refer to the original H&P for additional details.  The current plan of care is to f/u on ambulation.  08:00 PM  Patient is up and ambulatory to the bathroom with a walker.  She does have a walker at home.  She describes pain but manageable and able to ambulate.  Her daughter lives at home with her and can assist.  Will help her with a ride home. Plan for Home Health orders as well.     Darra Fonda MATSU, MD 01/08/24 2007

## 2024-01-08 NOTE — ED Notes (Signed)
Pt not in room, pt in radiology.  

## 2024-01-08 NOTE — ED Notes (Signed)
 Alert, NAD, calm, interactive. No relief with robaxin .

## 2024-01-08 NOTE — ED Provider Notes (Signed)
 [place] EMERGENCY DEPARTMENT AT [facility] Provider Note   CSN: [id number] Arrival date & time: 01/08/24  1003     Patient presents with: Rhonda Palmer is a 53 y.o. female.   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  Patient reports that last night she rolled out of bed falling on her right side.  She hit her right side of her head on her dresser and was lying on the floor was not able to stand or walk since that time.  She reports excruciating pain in the right hip and she is not even able to move her hip because it is so painful.  She reports the pain radiates around into her buttocks and pelvis area even onto the left.  No significant pain in the left leg.  She denies any neck pain and denies any nausea or vomiting.  No headaches or vision changes.  She did take Seroquel  last night around 9 PM which she does to help her sleep this is not a new medication.  She is still currently taking Plavix .  The history is provided by the patient.  Fall       Prior to Admission medications  Medication Sig Start Date End Date Taking? Authorizing Provider  ARISTADA  1064 MG/3.9ML prefilled syringe Inject 3.9 mLs into the muscle as directed. Every 2 months 06/19/22   [provider]  atorvastatin  (LIPITOR ) 20 MG tablet Take 1 tablet (20 mg total) by mouth daily. Patient not taking: Reported on 08/19/2023 05/16/23   Laurita Pillion, MD  bisacodyl  (DULCOLAX) 10 MG suppository Place 1 suppository (10 mg total) rectally as needed for moderate constipation. 08/27/23   Amin, Sumayya, MD  chlorhexidine (PERIDEX) 0.12 % solution Use as directed 5 mLs in the mouth or throat as needed. Use as directed 5 mLs in the mouth or throat as needed. 05/27/23   [provider]  clonazePAM  (KLONOPIN ) 0.5 MG  tablet Take 0.5 mg by mouth daily.    [provider]  clopidogrel  (PLAVIX ) 75 MG tablet Take 1 tablet (75 mg total) by mouth daily. 05/16/23   Laurita Pillion, MD  diclofenac  Sodium (VOLTAREN ) 1 % GEL Apply 4 g topically 4 (four) times daily. 08/27/23   Amin, Sumayya, MD  lamoTRIgine  (LAMICTAL ) 200 MG tablet Take 200 mg by mouth daily. 05/05/23   [provider]  levothyroxine  (SYNTHROID ) 200 MCG tablet Take 200 mcg by mouth daily before breakfast.    [provider]  methocarbamol  (ROBAXIN ) 750 MG tablet Take 1 tablet (750 mg total) by mouth every 8 (eight) hours as needed for muscle spasms. 01/08/24   Doretha Folks, MD  ondansetron  (ZOFRAN ) 4 MG tablet Take 4 mg by mouth every 6 (six) hours as needed. 04/23/23   [provider]  oxyCODONE  (OXY IR/ROXICODONE ) 5 MG immediate release tablet Take 1 tablet (5 mg total) by mouth every 4 (four) hours as needed for severe pain (pain score 7-10). 08/27/23   Amin, Sumayya, MD  OZEMPIC, 1 MG/DOSE, 4 MG/3ML SOPN Inject 1 mg into the skin once a week. 12/23/21   [provider]  polyethylene glycol (MIRALAX  / GLYCOLAX ) 17 g packet Take 17 g by mouth 2 (two) times daily as needed for moderate constipation or severe constipation. 08/27/23   Amin, Sumayya, MD  pregabalin  (LYRICA ) 100 MG capsule Take 1 capsule (100 mg total) by mouth 2 (two) times daily. 08/27/23   Amin, Sumayya, MD  propranolol  (INDERAL ) 40 MG tablet Take 40 mg by mouth 2 (two) times daily. 03/26/23   [provider]  QUEtiapine  (SEROQUEL ) 300 MG tablet Take 600 mg by mouth at bedtime. 12/30/21   [provider]  torsemide  (DEMADEX ) 20 MG tablet Take 1 tablet (20 mg total) by mouth daily. Patient not taking: Reported on 08/19/2023 01/20/22   Gonfa, Taye T, MD    Allergies: Codeine, Sulfa antibiotics, Penicillins, and Aspirin     Review of Systems  Updated Vital Signs BP 99/64   Pulse 66   Temp 98.7 F (37.1 C) (Oral)   Resp 16   SpO2  100%   Physical Exam Vitals and nursing note reviewed.  Constitutional:      General: She is not in acute distress.    Appearance: She is well-developed.     Comments: Awake and alert at this time.  Able to easily answer exam questions.  HENT:     Head: Normocephalic and atraumatic.   Eyes:     Extraocular Movements: Extraocular movements intact.     Pupils: Pupils are equal, round, and reactive to light.  Cardiovascular:     Rate and Rhythm: Normal rate and regular rhythm.     Heart sounds: Normal heart sounds. No murmur heard.    No friction rub.  Pulmonary:     Effort: Pulmonary effort is normal.     Breath sounds: Normal breath sounds. No wheezing or rales.  Abdominal:     General: Bowel sounds are normal. There is no distension.     Palpations: Abdomen is soft.     Tenderness: There is no abdominal tenderness. There is no guarding or rebound.  Musculoskeletal:        General: Tenderness present.     Cervical back: Normal range of motion and neck supple. No tenderness.     Right hip: Tenderness and bony tenderness present. Decreased range of motion.     Left hip: Normal.     Right knee: Normal.     Left knee: Normal.     Right ankle: Normal.     Left ankle: Normal.     Comments: No edema  Skin:    General: Skin is warm and dry.     Findings: No rash.  Neurological:     Mental Status: She is alert and oriented to person, place, and time. Mental status is at baseline.     Cranial Nerves: No cranial nerve deficit.  Psychiatric:        Behavior: Behavior normal.     (all labs ordered are listed, but only abnormal results are displayed) Labs Reviewed  CBC WITH DIFFERENTIAL/PLATELET - Abnormal; Notable for the following components:      Result Value   RBC 3.69 (*)    Hemoglobin 10.6 (*)    HCT 35.1 (*)    All other components within normal limits  COMPREHENSIVE METABOLIC PANEL WITH GFR - Abnormal; Notable for the following components:   Glucose, Bld 182 (*)     Calcium  8.5 (*)    Total Protein 5.5 (*)    Albumin  2.9 (*)    AST 513 (*)    ALT 193 (*)    Alkaline Phosphatase 172 (*)    All other components within normal limits  CBG MONITORING, ED - Abnormal; Notable for the following components:  Glucose-Capillary 178 (*)    All other components within normal limits  CBG MONITORING, ED    EKG: None  Radiology: CT Hip Right Wo Contrast Result Date: 01/08/2024 EXAM: CT OF THE RIGHT HIP WITHOUT IV CONTRAST 01/08/2024 02:15:33 PM TECHNIQUE: CT of the right hip was performed without the administration of intravenous contrast. Multiplanar reformatted images are provided for review. Automated exposure control, iterative reconstruction, and/or weight based adjustment of the mA/kV was utilized to reduce the radiation dose to as low as reasonably achievable. COMPARISON: Radiographs 03/11/2023, CT scan pelvis 08/21/2023, and MRI of the right femur dated 08/24/2023. CLINICAL HISTORY: Hip trauma, fracture suspected, xray done. FINDINGS: BONES: No fracture or acute finding involving the proximal right femur or adjacent femur of the right pelvis. No aggressive appearing osseous abnormality or periostitis. I don't see abnormal widening of the right sacroiliac joint or a well defined fracture of the right sacrum in its visualized portion. SOFT TISSUE: Normal appearance of the right sciatic notch region with stranding and tracking around the sciatic nerve in the sciatic notch and also along the margins of the piriformis muscle, with mild expansion of the piriformis muscle (2.3 cm in thickness on image 37 series 9 compared to 1.7 cm on the CT pelvis from 08/21/2023). The possibility of a muscular strain or tear involving the piriformis with surrounding edema is raised; the abnormal appearance could be further characterized with musculoskeletal protocol pelvic MRI without contrast if clinically indicated. Sciatic neuropathy not excluded. Mild subcutaneous edema lateral to  the right hip. No soft tissue mass. JOINT: No significant degenerative changes. No osseous erosions. INTRAPELVIC CONTENTS: Prominence of stool in the distal sigmoid colon and rectal vault. Limited images of the intrapelvic contents are otherwise unremarkable. IMPRESSION: 1. No acute fracture involving the right hip or pelvis. 2. Abnormal findings involving the right piriformis muscle and sciatic notch compatible with muscular strain or tear with surrounding edema; post-traumatic sciatic neuropathy is not excluded. Consider musculoskeletal protocol pelvic MRI without contrast if clinically indicated. 3. Mild subcutaneous edema lateral to the right hip. 4. Incidental stool burden in the distal sigmoid colon and rectal vault. Electronically signed by: Ryan Salvage MD 01/08/2024 02:31 PM EST RP Workstation: [id number]   DG Hip Unilat W or Wo Pelvis 2-3 Views Right Result Date: 01/08/2024 CLINICAL DATA:  Right hip pain after fall last night EXAM: DG HIP (WITH OR WITHOUT PELVIS) 2-3V RIGHT COMPARISON:  Jun 10, 2017 FINDINGS: There is no evidence of hip fracture or dislocation. There is no evidence of arthropathy or other focal bone abnormality. IMPRESSION: Negative. Electronically Signed   By: Lynwood Landy Raddle M.D.   On: 01/08/2024 13:18   CT Cervical Spine Wo Contrast Result Date: 01/08/2024 EXAM: CT CERVICAL SPINE WITHOUT CONTRAST 01/08/2024 11:57:00 AM TECHNIQUE: CT of the cervical spine was performed without the administration of intravenous contrast. Multiplanar reformatted images are provided for review. Automated exposure control, iterative reconstruction, and/or weight based adjustment of the mA/kV was utilized to reduce the radiation dose to as low as reasonably achievable. COMPARISON: None available. CLINICAL HISTORY: Polytrauma, blunt. FINDINGS: CERVICAL SPINE: BONES AND ALIGNMENT: Well corticated defect of the C7 spinous process is unchanged from prior compatible with remote fracture. No evidence  of acute fracture or traumatic malalignment. DEGENERATIVE CHANGES: No significant degenerative changes. SOFT TISSUES: No prevertebral soft tissue swelling. IMPRESSION: 1. No acute fracture or traumatic malalignment. 2. Unchanged well-corticated defect of the C7 spinous process, compatible with remote fracture. Electronically signed by: Donnice Mania MD 01/08/2024  12:10 PM EST RP Workstation: [id number]   CT Head Wo Contrast Result Date: 01/08/2024 EXAM: CT HEAD WITHOUT 01/08/2024 11:57:00 AM TECHNIQUE: CT of the head was performed without the administration of intravenous contrast. Automated exposure control, iterative reconstruction, and/or weight based adjustment of the mA/kV was utilized to reduce the radiation dose to as low as reasonably achievable. COMPARISON: 05/13/23 CLINICAL HISTORY: Head trauma, abnormal mental status (Age 22-64y); fell out of bed, groggy. FINDINGS: BRAIN AND VENTRICLES: No acute intracranial hemorrhage. No mass effect or midline shift. No extra-axial fluid collection. No evidence of acute infarct. No hydrocephalus. ORBITS: No acute abnormality. SINUSES AND MASTOIDS: Mucosal thickening in the left maxillary sinus. No acute abnormality of the mastoids. SOFT TISSUES AND SKULL: No acute skull fracture. Mild soft tissue swelling over the right forehead and right supraorbital ridge. IMPRESSION: 1. No acute intracranial abnormality. 2. Mild soft tissue swelling over the right forehead and right supraorbital ridge. Electronically signed by: Donnice Mania MD 01/08/2024 12:04 PM EST RP Workstation: [id number]     Procedures   Medications Ordered in the ED  methocarbamol  (ROBAXIN ) tablet 500 mg (has no administration in time range)  oxyCODONE -acetaminophen  (PERCOCET/ROXICET) 5-325 MG per tablet 1 tablet (1 tablet Oral Given 01/08/24 1416)                                    Medical Decision Making Amount and/or Complexity of Data Reviewed Radiology: ordered.  Risk Prescription  drug management.   Pt with multiple medical problems and comorbidities and presenting today with a complaint that caries a high risk for morbidity and mortality.  Here today with the above complaint.  Will rule out intracranial hemorrhage due to being on Plavix  and hitting her head but she is not displaying any symptoms concerning for intracranial pathology at this time.  Also concern for pelvic or hip fracture based on patient's exam and pain. I have independently visualized and interpreted pt's images today.  Hip image without obvious sign of pelvic fracture or hip fracture.  Head CT without evidence of bleed and cervical spine without evidence of acute fracture.  Radiology reports no acute intercranial abnormalities with some mild soft tissue swelling in the right forehead also remote C7 spinous process fracture that is well-healed but no acute findings.  Given patient's significant pain we will do a CT for further evaluation.  I independently interpreted patient's labs and CBC with normal white count hemoglobin of 10.6 with a normal platelet count, CMP with elevated LFTs today with an AST of 513 and ALT of 193 which is new from August.  Patient does not have any abdominal pain at this time and is not complaining of any nausea.  She has not started any new medication and reports is only taken Tylenol  once in the last 5 years.  She never drinks alcohol and denies any symptoms suggestive of dehydration or poor oral intake.       Final diagnoses:  Fall, initial encounter  Strain of piriformis muscle, initial encounter    ED Discharge Orders          Ordered    methocarbamol  (ROBAXIN ) 750 MG tablet  Every 8 hours PRN        01/08/24 1501               Doretha Folks, MD 01/08/24 [phone]

## 2024-01-08 NOTE — ED Notes (Signed)
 Pt given narcan spray per EDP Miller, no IV access

## 2024-01-08 NOTE — ED Triage Notes (Signed)
 Pt from home via [facility] with reports of falling out of bed. Pt BS was 60, pt was given glucagon en route. Pt lethargic at this time.
